# Patient Record
Sex: Female | Born: 1945
Health system: Southern US, Community
[De-identification: ages and names within clinical notes are randomized; demographics above are authoritative.]

## PROBLEM LIST (undated history)

## (undated) DIAGNOSIS — K219 Gastro-esophageal reflux disease without esophagitis: Secondary | ICD-10-CM

## (undated) DIAGNOSIS — M1990 Unspecified osteoarthritis, unspecified site: Secondary | ICD-10-CM

## (undated) DIAGNOSIS — T7840XA Allergy, unspecified, initial encounter: Secondary | ICD-10-CM

## (undated) DIAGNOSIS — J302 Other seasonal allergic rhinitis: Secondary | ICD-10-CM

## (undated) HISTORY — PX: BACK SURGERY: SHX140

## (undated) HISTORY — PX: COLONOSCOPY: SHX174

## (undated) HISTORY — DX: Gastro-esophageal reflux disease without esophagitis: K21.9

## (undated) HISTORY — DX: Other seasonal allergic rhinitis: J30.2

## (undated) HISTORY — DX: Unspecified osteoarthritis, unspecified site: M19.90

## (undated) HISTORY — DX: Allergy, unspecified, initial encounter: T78.40XA

## (undated) HISTORY — PX: CERVICAL DISC SURGERY: SHX588

---

## 1998-06-17 ENCOUNTER — Encounter: Payer: Self-pay | Admitting: Neurological Surgery

## 1998-06-17 ENCOUNTER — Ambulatory Visit (HOSPITAL_COMMUNITY): Admission: RE | Admit: 1998-06-17 | Discharge: 1998-06-17 | Payer: Self-pay | Admitting: Neurological Surgery

## 1999-04-22 ENCOUNTER — Other Ambulatory Visit: Admission: RE | Admit: 1999-04-22 | Discharge: 1999-04-22 | Payer: Self-pay | Admitting: Gynecology

## 1999-05-17 ENCOUNTER — Encounter: Payer: Self-pay | Admitting: Gynecology

## 1999-05-17 ENCOUNTER — Encounter: Admission: RE | Admit: 1999-05-17 | Discharge: 1999-05-17 | Payer: Self-pay | Admitting: Gynecology

## 2000-04-24 ENCOUNTER — Other Ambulatory Visit: Admission: RE | Admit: 2000-04-24 | Discharge: 2000-04-24 | Payer: Self-pay | Admitting: Gynecology

## 2001-05-09 ENCOUNTER — Other Ambulatory Visit: Admission: RE | Admit: 2001-05-09 | Discharge: 2001-05-09 | Payer: Self-pay | Admitting: Obstetrics and Gynecology

## 2001-12-06 ENCOUNTER — Encounter: Payer: Self-pay | Admitting: Family Medicine

## 2001-12-06 ENCOUNTER — Encounter: Admission: RE | Admit: 2001-12-06 | Discharge: 2001-12-06 | Payer: Self-pay | Admitting: Family Medicine

## 2002-02-12 ENCOUNTER — Ambulatory Visit (HOSPITAL_BASED_OUTPATIENT_CLINIC_OR_DEPARTMENT_OTHER): Admission: RE | Admit: 2002-02-12 | Discharge: 2002-02-12 | Payer: Self-pay | Admitting: Orthopaedic Surgery

## 2002-03-22 ENCOUNTER — Encounter: Payer: Self-pay | Admitting: Orthopaedic Surgery

## 2002-03-22 ENCOUNTER — Encounter: Admission: RE | Admit: 2002-03-22 | Discharge: 2002-03-22 | Payer: Self-pay | Admitting: Orthopaedic Surgery

## 2002-05-12 ENCOUNTER — Observation Stay (HOSPITAL_COMMUNITY): Admission: EM | Admit: 2002-05-12 | Discharge: 2002-05-13 | Payer: Self-pay | Admitting: *Deleted

## 2002-05-12 ENCOUNTER — Encounter: Payer: Self-pay | Admitting: *Deleted

## 2002-05-14 ENCOUNTER — Other Ambulatory Visit: Admission: RE | Admit: 2002-05-14 | Discharge: 2002-05-14 | Payer: Self-pay | Admitting: Obstetrics and Gynecology

## 2003-05-20 ENCOUNTER — Other Ambulatory Visit: Admission: RE | Admit: 2003-05-20 | Discharge: 2003-05-20 | Payer: Self-pay | Admitting: Obstetrics and Gynecology

## 2004-06-03 ENCOUNTER — Other Ambulatory Visit: Admission: RE | Admit: 2004-06-03 | Discharge: 2004-06-03 | Payer: Self-pay | Admitting: Obstetrics and Gynecology

## 2005-06-09 ENCOUNTER — Encounter (INDEPENDENT_AMBULATORY_CARE_PROVIDER_SITE_OTHER): Payer: Self-pay | Admitting: Specialist

## 2005-06-09 ENCOUNTER — Ambulatory Visit (HOSPITAL_BASED_OUTPATIENT_CLINIC_OR_DEPARTMENT_OTHER): Admission: RE | Admit: 2005-06-09 | Discharge: 2005-06-09 | Payer: Self-pay | Admitting: Orthopedic Surgery

## 2005-06-30 ENCOUNTER — Other Ambulatory Visit: Admission: RE | Admit: 2005-06-30 | Discharge: 2005-06-30 | Payer: Self-pay | Admitting: Obstetrics and Gynecology

## 2006-03-21 LAB — HM COLONOSCOPY

## 2007-07-17 ENCOUNTER — Encounter: Admission: RE | Admit: 2007-07-17 | Discharge: 2007-07-17 | Payer: Self-pay | Admitting: Orthopaedic Surgery

## 2007-08-02 ENCOUNTER — Encounter: Admission: RE | Admit: 2007-08-02 | Discharge: 2007-08-02 | Payer: Self-pay | Admitting: Family Medicine

## 2007-08-31 ENCOUNTER — Encounter: Admission: RE | Admit: 2007-08-31 | Discharge: 2007-08-31 | Payer: Self-pay | Admitting: Obstetrics and Gynecology

## 2008-08-12 ENCOUNTER — Encounter: Admission: RE | Admit: 2008-08-12 | Discharge: 2008-08-12 | Payer: Self-pay | Admitting: Orthopaedic Surgery

## 2009-02-26 ENCOUNTER — Encounter: Admission: RE | Admit: 2009-02-26 | Discharge: 2009-02-26 | Payer: Self-pay | Admitting: Orthopaedic Surgery

## 2009-05-19 ENCOUNTER — Encounter: Payer: Self-pay | Admitting: Cardiovascular Disease

## 2009-08-24 ENCOUNTER — Encounter: Payer: Self-pay | Admitting: Cardiovascular Disease

## 2009-08-28 DIAGNOSIS — R0789 Other chest pain: Secondary | ICD-10-CM | POA: Insufficient documentation

## 2009-08-31 ENCOUNTER — Ambulatory Visit: Payer: Self-pay | Admitting: Cardiovascular Disease

## 2009-08-31 DIAGNOSIS — R072 Precordial pain: Secondary | ICD-10-CM

## 2009-08-31 HISTORY — DX: Precordial pain: R07.2

## 2009-09-23 ENCOUNTER — Ambulatory Visit: Payer: Self-pay

## 2009-09-23 ENCOUNTER — Encounter: Payer: Self-pay | Admitting: Cardiovascular Disease

## 2009-09-23 ENCOUNTER — Ambulatory Visit (HOSPITAL_COMMUNITY): Admission: RE | Admit: 2009-09-23 | Discharge: 2009-09-23 | Payer: Self-pay | Admitting: Cardiovascular Disease

## 2009-09-23 ENCOUNTER — Ambulatory Visit: Payer: Self-pay | Admitting: Cardiology

## 2009-09-29 ENCOUNTER — Ambulatory Visit: Payer: Self-pay | Admitting: Cardiovascular Disease

## 2010-04-22 NOTE — Assessment & Plan Note (Signed)
Summary: np6/chest pain/jml   Visit Type:  new pt visit Primary Provider:  Gilmore Laroche  CC:  chest pain..sob at times w/left arm pain and jaw pain....no other fcomplaints today.  History of Present Illness: 65 yo WF with history of arthritis but no other significant medical history who is referred today for evaluation of chest pressure. The pain starts in the center of her chest and radiates into her neck and left arm. There is associated SOB. There is no associated diaphoresis or nausea. This lasts for one minute. No  prior cardiac history. This has been occurring once every three months for the past two years. No prior treatment for GERD.   Current Medications (verified): 1)  Diclofenac Sodium 75 Mg Tbec (Diclofenac Sodium) .... 2 Tab Once Daily 2)  Ferrousul 325 (65 Fe) Mg Tabs (Ferrous Sulfate) .Marland Kitchen.. 1 Tab Once Daily 3)  Dhea 10 Mg Caps (Prasterone (Dhea)) .... 1/2 Cap Once Daily 4)  Fish Oil  Oil (Fish Oil) .... 2 Cap Once Daily 5)  No Flush Niacin 400-100 Mg Caps (Niacin-Inositol) .Marland Kitchen.. 1 Tab Once Daily 6)  Vitamin D3 5000 Unit Caps (Cholecalciferol) .Marland Kitchen.. 1 Cap Once Daily 7)  Glucosamine-Chondroitin 1500-1200 Mg/59ml Liqd (Glucosamine-Chondroitin) .... 2 Tab Once Daily 8)  Multivitamins   Tabs (Multiple Vitamin) .Marland Kitchen.. 1 Tab Once Daily 9)  Calcium/vitamin D/minerals 600-200 Mg-Unit Tabs (Calcium Carbonate-Vit D-Min) .... 2 Tabs Once Daily 10)  Vitamin C 500 Mg Tabs (Ascorbic Acid) .Marland Kitchen.. 1 Tab Once Daily 11)  Prolent .... 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  The patient denies fatigue, malaise, fever, weight gain/loss, vision loss, decreased hearing, hoarseness, palpitations, shortness of breath, prolonged cough, wheezing, sleep apnea, coughing up blood, abdominal pain, blood in stool, nausea, vomiting, diarrhea, heartburn, incontinence, blood in urine, muscle weakness, joint pain, leg swelling, rash, skin lesions, headache, fainting, dizziness, depression, anxiety, enlarged lymph nodes, easy bruising or bleeding, and environmental allergies.    Vital Signs:  Patient profile:   65 year old female Height:      63 inches Weight:      127 pounds BMI:     22.58 Pulse rate:   83 / minute Pulse rhythm:   regular BP sitting:   120 / 60  (left arm) Cuff size:   large  Vitals Entered By: Danielle Rankin, CMA (August 31, 2009 11:47 AM)  Physical Exam  General:  General: Well developed, well nourished, NAD HEENT: OP clear, mucus membranes moist SKIN: warm, dry Neuro: No focal deficits Musculoskeletal: Muscle strength 5/5 all ext Psychiatric: Mood and affect normal Neck: No JVD, no carotid bruits, no thyromegaly, no lymphadenopathy. Lungs:Clear bilaterally, no wheezes, rhonci, crackles CV: RRR no murmurs, gallops rubs Abdomen: soft, NT, ND, BS present Extremities: No edema, pulses 2+.    EKG  Procedure date:  08/31/2009  Findings:      NSR, rate 83 bpm.   Impression & Recommendations:  Problem # 1:  CHEST PAIN-PRECORDIAL (ICD-786.51) She has no risk factors for CAD. Her chest pain  is aytpical. Given her age, she is concerned about the possibility of obstructive CAD. Will arrange stress echo to risk stratify.   Other Orders: EKG w/ Interpretation (93000) Stress Echo (Stress Echo)  Patient Instructions: 1)  Your physician recommends that you schedule a follow-up appointment in: 3-4 weeks 2)  Your physician has requested that  you have a stress echocardiogram. For further information please visit [email or web address]  Please follow instruction sheet as given.

## 2010-04-22 NOTE — Assessment & Plan Note (Signed)
Summary: 1 month rov./sl   Visit Type:  Follow-up Primary Provider:  Gilmore Laroche  CC:  no complaints.  History of Present Illness: 65 yo WF with history of arthritis but no other significant medical history who was referred several weeks ago for evaluation of chest pressure. The pain starts in the center of her chest and radiates into her neck and left arm. There is associated SOB. There is no associated diaphoresis or nausea. This lasts for one minute. No  prior cardiac history. This has been occurring once every three months for the past two years. No prior treatment for GERD. I arranged a stress echo. She exercised for over 9 minutes without chest pain or EKG changes. Stress echo images without evidence of ischemia. She had another episode yesterday with pain starting in the upper abdomen and radiating to the left chest wall. Lasted two minutes. No associated symptoms. Not after a meal. No heartburn or reflux symptoms.    Current Medications (verified): 1)  Diclofenac Sodium 75 Mg Tbec (Diclofenac Sodium) .... 2 Tab Once Daily 2)  Ferrousul 325 (65 Fe) Mg Tabs (Ferrous Sulfate) .Marland Kitchen.. 1 Tab Once Daily 3)  Dhea 10 Mg Caps (Prasterone (Dhea)) .... 1/2 Cap Once Daily 4)  Fish Oil  Oil (Fish Oil) .... 2 Cap Once Daily 5)  No Flush Niacin 400-100 Mg Caps (Niacin-Inositol) .Marland Kitchen.. 1 Tab Once Daily 6)  Vitamin D3 5000 Unit Caps (Cholecalciferol) .Marland Kitchen.. 1 Cap Once Daily 7)  Glucosamine-Chondroitin 1500-1200 Mg/71ml Liqd (Glucosamine-Chondroitin) .... 2 Tab Once Daily 8)  Multivitamins   Tabs (Multiple Vitamin) .Marland Kitchen.. 1 Tab Once Daily 9)  Calcium/vitamin D/minerals 600-200 Mg-Unit Tabs (Calcium Carbonate-Vit D-Min) .... 2 Tabs Once Daily 10)  Vitamin C 500 Mg Tabs (Ascorbic Acid) .Marland Kitchen.. 1 Tab Once Daily 11)  Prolent .... 2 Tab Once Daily  Allergies (verified): No Known Drug Allergies  Past History:  Past Medical History: Reviewed history from 08/31/2009 and no changes required. Arthritis Seasonal  allergies  Social History: Reviewed history from 08/31/2009 and no changes required. 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  The patient denies fatigue, malaise, fever, weight gain/loss, vision loss, decreased hearing, hoarseness, palpitations, shortness of breath, prolonged cough, wheezing, sleep apnea, coughing up blood, abdominal pain, blood in stool, nausea, vomiting, diarrhea, heartburn, incontinence, blood in urine, muscle weakness, joint pain, leg swelling, rash, skin lesions, headache, fainting, dizziness, depression, anxiety, enlarged lymph nodes, easy bruising or bleeding, and environmental allergies.    Vital Signs:  Patient profile:   65 year old female Height:      63 inches Weight:      125 pounds BMI:     22.22 Pulse rate:   70 / minute BP sitting:   102 / 60  (left arm) Cuff size:   regular  Vitals Entered By: Hardin Negus, RMA (September 29, 2009 11:28 AM)  Physical Exam  General:  General: Well developed, well nourished, NAD Psychiatric: Mood and affect normal Neck: No JVD, no carotid bruits Lungs:Clear bilaterally, no wheezes, rhonci, crackles CV: RRR no murmurs, gallops rubs Abdomen: soft, NT, ND, BS present Extremities: No edema, pulses 2+.    Stress Echocardiogram  Procedure date:  09/23/2009  Findings:      Stress results: Maximal heart rate during stress was 169bpm (107% of maximal predicted heart rate). The maximal predicted heart  rate was 157bpm. The target heart rate was achieved. The heart rate response to stress was normal. There was a normal resting blood pressure with an appropriate response to stress. The rate-pressure product for the peak heart rate and blood pressure was Hg/min. The patient experienced no chest pain during  stress.  -------------------------------------------------------------------- Stress ECG: The stress ECG was normal.  -------------------------------------------------------------------- Baseline:  - LV size was normal. - LV global systolic function was normal. - Normal wall motion; no LV regional wall motion abnormalities. Peak stress:  - LV size was reduced appropriately and appropriately decreased from   baseline. - LV global systolic function was vigorous and appropriately   augmented from baseline. - Normal wall motion; no LV regional wall motion abnormalities.  -------------------------------------------------------------------- Stress echo results: Left ventricular ejection fraction was normal at rest and with stress. Normal echo stress  Impression & Recommendations:  Problem # 1:  CHEST PAIN, ATYPICAL (ICD-786.59) No evidence of ischemia on stress echo. She has no cardiac risk factors. Her pain is most likely GI related. I have encouraged her to seek medical attention if the symptoms worsen or increase in frequency. She does not wish to start a PPI at this time.   Patient Instructions: 1)  Your physician recommends that you schedule a follow-up appointment as needed.:

## 2010-04-22 NOTE — Progress Notes (Signed)
Summary: [facility] Visit Note   [facility] Visit Note   Imported By: Roderic Ovens 09/17/2009 10:16:25  _____________________________________________________________________  External Attachment:    Type:   Image     Comment:   External Document

## 2010-08-06 NOTE — Op Note (Signed)
Taylor Taylor, Taylor Taylor                ACCOUNT NO.:  [id number]   MEDICAL RECORD NO.:  [id number]          PATIENT TYPE:  AMB   LOCATION:  DSC                          FACILITY:  [facility]   PHYSICIAN:  Cindee Salt, M.D.       DATE OF BIRTH:  04-15-1945   DATE OF PROCEDURE:  06/09/2005  DATE OF DISCHARGE:                                 OPERATIVE REPORT   PREOPERATIVE DIAGNOSES:  1.  Stenosing tenosynovitis, left thumb.  2.  Volar wrist ganglion, left wrist.   POSTOPERATIVE DIAGNOSES:  1.  Stenosing tenosynovitis, left thumb.  2.  Volar wrist ganglion, left wrist.   OPERATION:  1.  Release of A1 pulley, left thumb.  2.  Excision volar wrist, ganglion left wrist.   SURGEON:  Cindee Salt, M.D.   ASSISTANT:  Carolyne Fiscal R.N.   ANESTHESIA:  General.   HISTORY:  The patient is a 65 year old female with a history of triggering  of her right thumb.  This is locked, and she also has a volar wrist  ganglion.   PROCEDURE:  The patient was brought to the operating room, where a general  anesthetic was carried out without difficulty.  This was performed after  questions were answered in the preoperative area and the areas of incision  marked by both the patient and surgeon.  She was prepped and draped using  DuraPrep, supine position, left arm free.  The limb was exsanguinated with  an Esmarch bandage, tourniquet placed high on the arm was inflated to 250  mmHg.  A transverse incision was made over the A1 pulley of the left thumb,  and radial and ulnar neurovascular bundles were identified and protected.  Retractor was placed.  Significant thickening of the A1 pulley was noted.  A  large node was present in the flexor tendon.  No cyst was palpable.  The  entire mass on the volar aspect was of flexor tendon primarily with a lesser  extent the enlarged A1 pulley.  The A1 pulley was released on its radial  aspect.  Fraying of the flexor tendon was noted.  The frayed area was  debrided.  The wound was  irrigated.  The thumb was able to be passed through  a full range of motion, no further triggering was noted.  The Wound was  closed with interrupted 5-0 nylon sutures.  A separate incision was then  made on the volar radial wrist, carried down through subcutaneous tissue,  bleeders again electrocauterized, dissection carried down identifying the  radial artery.  A large multilobulated cyst was immediately encountered,  with blunt and sharp dissection this was dissected free.  A small portion of  the wall of the cyst was left attached to the radial artery.  The dissection  was carried down to the radiocarpal joint.  This area was opened, the stalk  excised, the area debrided with a rongeur.  The wound was irrigated.  The  capsule was then closed with figure-of-eight 4-0 Vicryl sutures, the  subcutaneous tissue with 4-0  Vicryl and skin with interrupted 5-0 nylon sutures.  A sterile  compressive  dressing and splint to the wrist applied.  The patient tolerated the  procedure well, was taken to the recovery room for observation in  satisfactory condition.  She is discharged home to return to the [facility] in one week on Vicodin.           ______________________________  Cindee Salt, M.D.     GK/MEDQ  D:  06/09/2005  T:  06/10/2005  Job:  [id number]

## 2010-08-06 NOTE — Op Note (Signed)
   [name], Taylor Taylor                            ACCOUNT NO.:  [id number]   MEDICAL RECORD NO.:  [id number]                   PATIENT TYPE:  AMB   LOCATION:  DSC                                  FACILITY:  [facility]   PHYSICIAN:  Claude Manges. Cleophas Dunker, M.D.            DATE OF BIRTH:  Jul 26, 1945   DATE OF PROCEDURE:  02/12/2002  DATE OF DISCHARGE:                                 OPERATIVE REPORT   PREOPERATIVE DIAGNOSIS:  Adhesive capsulitis, right shoulder.   POSTOPERATIVE DIAGNOSIS:  Adhesive capsulitis, right shoulder.   OPERATION PERFORMED:  Manipulation and injection of 40 mg of Depo-Medrol,  right shoulder.   SURGEON:  Claude Manges. Cleophas Dunker, M.D.   ANESTHESIA:  General mask.   COMPLICATIONS:  None.   INDICATIONS FOR PROCEDURE:  The patient is a 65 year old female  approximately a month status post arthroscopic subacromial decompression of  the right shoulder.  He has developed an adhesive capsulitis despite course  of physical therapy.  She is now to have manipulation lacking approximately  40 degrees of overhead flexion and about 30 to 40 degrees of full external  rotation.   DESCRIPTION OF PROCEDURE:  With the patient comfortable on the operating  room stretcher and under general mask anesthetic, general manipulation of  the left shoulder was performed.  There was obvious lysis of adhesions such  that I could fully flex the arm overhead and I also manipulated the shoulder  in external rotation.  The shoulder was then prepped with alcohol and  Betadine.  Marcaine, Xylocaine and 40 mg of Depo-Medrol was injected intra-  articularly.  A band-aid was applied and the patient was then awakened and  returned to the post anesthesia recovery room in satisfactory condition.  She will have planned follow-up physical therapy tomorrow or Friday  depending upon her pain.  She has Percocet at home for discomfort.  I will  check her in the office in 10 to 14 days.                           Claude Manges. Cleophas Dunker, M.D.    PWW/MEDQ  D:  02/12/2002  T:  02/12/2002  Job:  [id number]

## 2010-08-06 NOTE — H&P (Signed)
NAMECALLYN, SEVERTSON NO.:  [id number]   MEDICAL RECORD NO.:  [id number]                   PATIENT TYPE:  INP   LOCATION:  2023                                 FACILITY:  [facility]   PHYSICIAN:  Arvilla Meres, M.D. [facility]          DATE OF BIRTH:  1945/07/23   DATE OF ADMISSION:  05/12/2002  DATE OF DISCHARGE:                                HISTORY & PHYSICAL   PRIMARY CARE PHYSICIAN:  Miguel Aschoff, M.D. at [facility].   CARDIOLOGIST:  She is new to [facility].   HISTORY OF PRESENT ILLNESS:  The patient is a delightful and very active 65-  year-old white female without a history of known coronary artery disease or  significant cardiac risk factors, who is admitted through the emergency room  for two episodes of chest pressure radiating to her neck over the past six  weeks.   Today, she reports she had the sudden onset of midsternal chest pressure at  about 11:45 a.m. while standing in church.  This was associated with mild  nausea and pain radiating to her neck and her jaw.  There was no associated  vomiting, shortness of breath, diaphoresis, or palpitations.  She said the  episode lasted about 10 minutes, and resolved spontaneously.  She went to a  fire station and had an EKG checked which was okay, but she was advised that  she should go to the hospital for further evaluation.  In the ER, her EKG  showed sinus bradycardia without any ischemic changes, and her first set of  enzymes were negative.   She reports a similar episode approximately six weeks ago also at rest which  resolved spontaneously.   She does say that she walks on the treadmill fairly vigorously two miles a  day without any exertional symptoms.  She denies any recent long plane or  car trips, or any swelling in her legs.   REVIEW OF SYMPTOMS:  She denies fevers, chills, abdominal pain, symptoms of  reflux disease, dysuria, bright red blood per rectum,  melena, claudication,  or congestive heart failure symptoms.   PAST MEDICAL HISTORY:  1. She is post-menopausal x13 years, on hormone replacement therapy.  2. She has lumbar spine arthritis.   MEDICATIONS:  1. Hormone replacement therapy which she tried to discontinue 1-1/2 years     ago, but was unable to due to hot flashes.  2. Voltaren 75 mg daily.   ALLERGIES:  No known drug allergies.   SOCIAL HISTORY:  She lives in [place] with her husband.  She is a  housewife.  She denies any alcohol or drug use.   FAMILY HISTORY:  She has three siblings, none of whom have coronary disease.  Her mother is 47 and has Parkinson's.  Her father died at age 52 from lung  cancer.  There is no family history of coronary disease.  PHYSICAL EXAMINATION:  GENERAL:  She is very vigorous appearing.  VITAL SIGNS:  Blood pressure is 111/80, heart rate is 60 and regular,  temperature is 97.9, she is saturating 100% on room air.  HEENT:  Sclerae are anicteric.  NECK:  Supple, her carotids are 2+ bilaterally without bruits.  Her JVP is  approximately 7 cm of water with prominent CV waves.  There is also evidence  of hepatojugular reflux.  CHEST:  Nontender to palpation.  Her PMI is nondisplaced.  There is a  regular rate and rhythm with a normal S1 and S2.  She has a 2/6 systolic  ejection murmur at the left sternal border, as well as a faint diastolic  murmur.  LUNGS:  Clear to auscultation bilaterally.  ABDOMEN:  Soft, nontender, nondistended, there are no masses or bruits.  There is no hepatomegaly.  EXTREMITIES:  Femoral pulses are 2+ bilaterally without any bruits.  There  is no cyanosis, clubbing, or edema.  There are no cords.  Her dorsalis pedis  pulses are 2+ bilaterally.   LABORATORY DATA:  Her chest x-ray was negative per report.  EKG shows sinus  bradycardia at a rate of 58.  There is no evidence of ischemia.  White blood  cell count of 11, hemoglobin of 12.4, platelets of 293.  Sodium  of 139,  potassium 3.9, BUN 18, creatinine 0.8, glucose 91.  Lipid panel is normal.  CK is 71, MB 1, troponin-I is 0.01.   ASSESSMENT AND PLAN:  The patient is a very delightful 65 year old with no  history of known coronary disease, and no significant cardiac risk factors,  who is admitted for two episodes of rest chest pain.  Given her history and  the fact that there is no evidence of objective ischemia, I doubt very much  that this represents cardiac chest pain, however, the description of her  symptoms is somewhat concerning.  Therefore, I think it would be prudent to  bring her in for a 23 hour observation and rule out for myocardial  infarction.  If her enzymes remain negative, she will undergo a treadmill  Cardiolite in the morning for further risk stratification.  I have held her  beta blocker in anticipation for her stress test.  We will also check a  fasting lipid profile as well as a D-dimer.  By examination, she likely has  some degree of tricuspid regurgitation, but we will get an echocardiogram to  further delineate.  Should her cardiac markers turn positive, she will  obviously need cardiac catheterization.                                               Arvilla Meres, M.D. [facility]    DB/MEDQ  D:  05/12/2002  T:  05/12/2002  Job:  [id number]

## 2010-08-06 NOTE — Discharge Summary (Signed)
   NAMERAMANDA, PAULES                            ACCOUNT NO.:  [id number]   MEDICAL RECORD NO.:  [id number]                   PATIENT TYPE:  INP   LOCATION:  2023                                 FACILITY:  [facility]   PHYSICIAN:  Superior Bing, M.D. [facility]           DATE OF BIRTH:  [birth year]   DATE OF ADMISSION:  05/12/2002  DATE OF DISCHARGE:  05/13/2002                           DISCHARGE SUMMARY - REFERRING   PROCEDURE:  None.   REASON FOR ADMISSION:  Please refer to dictated admission note.   LABORATORY DATA:  Complete metabolic profile, CBC, serial cardiac markers,  TSH, D-dimer, and C reactive protein within normal limits.   Chest x-ray showed no active disease.   HOSPITAL COURSE:  The patient was admitted for further evaluation of new  onset chest pain.  Serial cardiac markers, as well as a D-dimer and C  reactive protein were within normal limits.  Serial EKGs showed no ischemic  changes.   The patient reported no recurrent chest pain following initial presentation  to the emergency room.  She was cleared for discharge by Dr. Eden Emms with  recommendation to proceed with early outpatient exercise stress Cardiolite  testing.  A 2-D echocardiogram  will also be arranged.  Plan is to have the  patient then return to the office for subsequent follow-up with Dr. Charlton Haws.   MEDICATION ADJUSTMENT:  Addition of low dose aspirin.   DISCHARGE MEDICATIONS:  1. Aspirin 81 mg daily.  2. Voltaren 75 mg daily p.r.n.  3. Estrogen replacement medication as directed.   DISCHARGE INSTRUCTIONS:  The patient is scheduled to proceed with an  exercise stress Cardiolite at the [facility] office on Wednesday,  May 15, 2002, at 1 o'clock.  She is also scheduled for a 2-D  echocardiogram  at 1:30.   The patient is scheduled to follow up with Dr. Charlton Haws on July 09, 2002, at 11 a.m.   DISCHARGE DIAGNOSES:  1. New onset chest pain.     a. Normal serial cardiac  markers.     b. Schedule for outpatient exercise Cardiolite.  2. Sinus bradycardia.    Gene Serpe, P.A. [facility]                      [place] Bing, M.D. [facility]   GS/MEDQ  D:  05/13/2002  T:  05/13/2002  Job:  [id number]

## 2010-12-08 ENCOUNTER — Other Ambulatory Visit: Payer: Self-pay | Admitting: Orthopaedic Surgery

## 2010-12-08 DIAGNOSIS — M25561 Pain in right knee: Secondary | ICD-10-CM

## 2010-12-15 ENCOUNTER — Ambulatory Visit
Admission: RE | Admit: 2010-12-15 | Discharge: 2010-12-15 | Disposition: A | Discharge status: Home / Self Care | Payer: BC Managed Care – PPO | Source: Ambulatory Visit | Attending: Orthopaedic Surgery | Admitting: Orthopaedic Surgery

## 2010-12-15 ENCOUNTER — Other Ambulatory Visit: Payer: Self-pay

## 2010-12-15 DIAGNOSIS — M25561 Pain in right knee: Secondary | ICD-10-CM

## 2010-12-21 ENCOUNTER — Telehealth: Payer: Self-pay | Admitting: Cardiovascular Disease

## 2010-12-21 NOTE — Telephone Encounter (Signed)
12 Lead faxed to [facility] @ [phone]   12/21/10/km

## 2011-05-20 LAB — HM DEXA SCAN

## 2012-07-24 ENCOUNTER — Encounter: Payer: Self-pay | Admitting: Family Medicine

## 2012-07-24 ENCOUNTER — Ambulatory Visit (INDEPENDENT_AMBULATORY_CARE_PROVIDER_SITE_OTHER): Payer: Medicare Other | Admitting: Family Medicine

## 2012-07-24 VITALS — BP 110/64 | HR 76 | Temp 98.2°F | Resp 16 | Wt 126.0 lb

## 2012-07-24 DIAGNOSIS — R197 Diarrhea, unspecified: Secondary | ICD-10-CM

## 2012-07-24 DIAGNOSIS — R0989 Other specified symptoms and signs involving the circulatory and respiratory systems: Secondary | ICD-10-CM

## 2012-07-24 DIAGNOSIS — R0609 Other forms of dyspnea: Secondary | ICD-10-CM

## 2012-07-24 DIAGNOSIS — R0600 Dyspnea, unspecified: Secondary | ICD-10-CM

## 2012-07-24 LAB — CBC WITH DIFFERENTIAL/PLATELET
Basophils Absolute: 0 10*3/uL (ref 0.0–0.1)
Basophils Relative: 0 % (ref 0–1)
Eosinophils Absolute: 0.1 10*3/uL (ref 0.0–0.7)
Eosinophils Relative: 2 % (ref 0–5)
HCT: 36.3 % (ref 36.0–46.0)
Hemoglobin: 11.9 g/dL — ABNORMAL LOW (ref 12.0–15.0)
Lymphocytes Relative: 34 % (ref 12–46)
Lymphs Abs: 1.8 10*3/uL (ref 0.7–4.0)
MCH: 30.6 pg (ref 26.0–34.0)
MCHC: 32.8 g/dL (ref 30.0–36.0)
MCV: 93.3 fL (ref 78.0–100.0)
Monocytes Absolute: 0.5 10*3/uL (ref 0.1–1.0)
Monocytes Relative: 10 % (ref 3–12)
Neutro Abs: 2.9 10*3/uL (ref 1.7–7.7)
Neutrophils Relative %: 54 % (ref 43–77)
Platelets: 274 10*3/uL (ref 150–400)
RBC: 3.89 MIL/uL (ref 3.87–5.11)
RDW: 13.6 % (ref 11.5–15.5)
WBC: 5.3 10*3/uL (ref 4.0–10.5)

## 2012-07-24 MED ORDER — FLUTICASONE PROPIONATE 50 MCG/ACT NA SUSP: 2.0000 | Freq: Every day | NASAL | Status: DC

## 2012-07-24 NOTE — Progress Notes (Signed)
  Subjective:    Patient ID: Taylor Taylor, female    DOB: 1945/05/26, 67 y.o.   MRN: [id number]  HPI Began Saturday. She says she's becoming winded with minimal exertion. This is new for her. She denies any chest pain. She denies any angina. She denies any radiation. She denies any orthopnea. She denies any paroxysmal nocturnal dyspnea.  She has no peripheral edema. She's not obtained fluid. She denies pleurisy. She has been given with allergies. She's also congested. She denies any wheezing or coughing.  Then beginning Monday she started developing diarrhea. She's now having loose artery stools for 2 days. She denies any melena, hematochezia, fever she is having a little bit of nausea but no vomiting. No past medical history on file. No current outpatient prescriptions on file prior to visit.   No current facility-administered medications on file prior to visit.   No Known Allergies History   Social History  . Marital Status: Married    Spouse Name: N/A    Number of Children: N/A  . Years of Education: N/A   Occupational History  . Not on file.   Social History Main Topics  . Smoking status: Never Smoker   . Smokeless tobacco: Not on file  . Alcohol Use: No  . Drug Use: No  . Sexually Active: Not on file   Other Topics Concern  . Not on file   Social History Narrative  . No narrative on file      Review of Systems  All other systems reviewed and are negative.       Objective:   Physical Exam  Constitutional: She appears well-developed and well-nourished.  HENT:  Right Ear: External ear normal.  Left Ear: External ear normal.  Mouth/Throat: Oropharynx is clear and moist.  Eyes: Conjunctivae are normal. Pupils are equal, round, and reactive to light.  Neck: Normal range of motion. Neck supple. No JVD present. No thyromegaly present.  Cardiovascular: Normal rate, regular rhythm, normal heart sounds and intact distal pulses.  Exam reveals no gallop and no friction  rub.   No murmur heard. Pulmonary/Chest: Effort normal and breath sounds normal. No respiratory distress. She has no wheezes. She has no rales. She exhibits no tenderness.  Abdominal: Soft. Bowel sounds are normal.  Lymphadenopathy:    She has no cervical adenopathy.          Assessment & Plan:  1. Dyspnea EKG shows normal sinus rhythm at 67 beats per minute with no ischemia or infarction.  There is no evidence of cardiac strain.  Check a CBC to rule out anemia. Her exam today is in completely normal. Of his likely part of the overall viral syndrome is more of a fatigue to dyspnea. As the patient to monitor for one week. She should return immediately if symptoms worsen or change. We discussed getting a chest x-ray but the patient elects to monitor for now. - CBC with Differential - EKG 12-Lead  2. Diarrhea Likely viral gastroenteritis.  Recommended Imodium over-the-counter when necessary diarrhea. I also caused him Flonase for her allergies. Recommended tincture of time for total 7 days. Symptoms worsen she is to call me immediately.

## 2012-10-24 ENCOUNTER — Other Ambulatory Visit: Payer: Self-pay

## 2012-10-31 ENCOUNTER — Encounter: Payer: Self-pay | Admitting: Family Medicine

## 2013-01-01 ENCOUNTER — Ambulatory Visit (INDEPENDENT_AMBULATORY_CARE_PROVIDER_SITE_OTHER): Payer: Medicare Other | Admitting: *Deleted

## 2013-01-01 VITALS — Temp 98.2°F

## 2013-01-01 DIAGNOSIS — Z23 Encounter for immunization: Secondary | ICD-10-CM

## 2013-02-21 ENCOUNTER — Other Ambulatory Visit: Payer: Self-pay | Admitting: Family Medicine

## 2013-02-21 MED ORDER — FLUTICASONE PROPIONATE 50 MCG/ACT NA SUSP: 2.0000 | Freq: Every day | NASAL | Status: DC

## 2013-02-21 NOTE — Telephone Encounter (Signed)
Rx Refilled  

## 2013-05-03 ENCOUNTER — Encounter: Payer: Self-pay | Admitting: Family Medicine

## 2013-05-03 ENCOUNTER — Ambulatory Visit (INDEPENDENT_AMBULATORY_CARE_PROVIDER_SITE_OTHER): Payer: Medicare Other | Admitting: Family Medicine

## 2013-05-03 VITALS — BP 122/64 | HR 78 | Temp 98.4°F | Resp 16 | Ht 64.0 in | Wt 128.0 lb

## 2013-05-03 DIAGNOSIS — J019 Acute sinusitis, unspecified: Secondary | ICD-10-CM

## 2013-05-03 MED ORDER — PREDNISONE 20 MG PO TABS: ORAL_TABLET | ORAL | Status: DC

## 2013-05-03 MED ORDER — CEFDINIR 300 MG PO CAPS: 300.0000 mg | ORAL_CAPSULE | Freq: Two times a day (BID) | ORAL | Status: DC

## 2013-05-03 NOTE — Progress Notes (Signed)
   Subjective:    Patient ID: Chiniqua Kilcrease, female    DOB: 10-Jan-1946, 68 y.o.   MRN: [id number]  HPI Patient presents with over 2 weeks of frontal and maxillary sinus pain pressure postnasal drip and copious rhinorrhea. She reports fevers and sinus pain and pressure. She denies any cough. She denies any sore throat. She denies any otalgia. She does state her ears feel stopped up. Past Medical History  Diagnosis Date  . GERD (gastroesophageal reflux disease)   . Allergy     Seasonal  . Arthritis    Current Outpatient Prescriptions on File Prior to Visit  Medication Sig Dispense Refill  . diclofenac (VOLTAREN) 75 MG EC tablet Take 75 mg by mouth 2 (two) times daily.      Marland Kitchen estradiol (ESTRACE) 1 MG tablet Take 1 mg by mouth daily.      . fluticasone (FLONASE) 50 MCG/ACT nasal spray Place 2 sprays into both nostrils daily.  48 g  4  . progesterone (PROMETRIUM) 100 MG capsule Take 100 mg by mouth daily.       No current facility-administered medications on file prior to visit.   No Known Allergies History   Social History  . Marital Status: Married    Spouse Name: N/A    Number of Children: N/A  . Years of Education: N/A   Occupational History  . Not on file.   Social History Main Topics  . Smoking status: Never Smoker   . Smokeless tobacco: Not on file  . Alcohol Use: No  . Drug Use: No  . Sexual Activity: Not on file   Other Topics Concern  . Not on file   Social History Narrative  . No narrative on file      Review of Systems  All other systems reviewed and are negative.       Objective:   Physical Exam  Vitals reviewed. Constitutional: She appears well-developed and well-nourished. No distress.  HENT:  Right Ear: External ear normal.  Left Ear: External ear normal.  Nose: Mucosal edema and rhinorrhea present. Right sinus exhibits maxillary sinus tenderness and frontal sinus tenderness. Left sinus exhibits maxillary sinus tenderness and frontal sinus  tenderness.  Mouth/Throat: No oropharyngeal exudate.  Eyes: Conjunctivae are normal. No scleral icterus.  Neck: Neck supple.  Cardiovascular: Normal rate, regular rhythm and normal heart sounds.  Exam reveals no gallop and no friction rub.   No murmur heard. Pulmonary/Chest: Effort normal and breath sounds normal. No respiratory distress. She has no wheezes. She has no rales.  Lymphadenopathy:    She has no cervical adenopathy.  Skin: She is not diaphoretic.          Assessment & Plan:  1. Acute rhinosinusitis - cefdinir (OMNICEF) 300 MG capsule; Take 1 capsule (300 mg total) by mouth 2 (two) times daily.  Dispense: 20 capsule; Refill: 0 - predniSONE (DELTASONE) 20 MG tablet; 3 tabs poqday 1-2, 2 tabs poqday 3-4, 1 tab poqday 5-6  Dispense: 12 tablet; Refill: 0 Patient should also use nasal saline 4 times a day. Recheck in 1 week if no better or sooner if worse.

## 2013-06-20 LAB — HM MAMMOGRAPHY

## 2013-07-04 ENCOUNTER — Encounter: Payer: Self-pay | Admitting: Family Medicine

## 2013-07-04 ENCOUNTER — Ambulatory Visit (INDEPENDENT_AMBULATORY_CARE_PROVIDER_SITE_OTHER): Payer: Medicare Other | Admitting: Family Medicine

## 2013-07-04 VITALS — BP 108/62 | HR 60 | Temp 98.1°F | Resp 14 | Ht 64.0 in | Wt 130.0 lb

## 2013-07-04 DIAGNOSIS — E039 Hypothyroidism, unspecified: Secondary | ICD-10-CM

## 2013-07-04 DIAGNOSIS — R635 Abnormal weight gain: Secondary | ICD-10-CM

## 2013-07-04 NOTE — Progress Notes (Signed)
   Subjective:    Patient ID: Taylor Taylor, female    DOB: August 31, 1945, 68 y.o.   MRN: [id number]  HPI Patient is here today requesting thyroid check. She is tired and she is unable to his weight. Her BMI today is 23 which is normal. She would like to weigh approximately 120 pounds because this is her weight when she was younger.  This would give her a BMI of 21.  She is exercising everyday running on the treadmill. She is eating a healthy diet she has not been able to lower her weight any further. Past Medical History  Diagnosis Date  . GERD (gastroesophageal reflux disease)   . Allergy     Seasonal  . Arthritis    Current Outpatient Prescriptions on File Prior to Visit  Medication Sig Dispense Refill  . diclofenac (VOLTAREN) 75 MG EC tablet Take 75 mg by mouth 2 (two) times daily.      Marland Kitchen estradiol (ESTRACE) 1 MG tablet Take 1 mg by mouth daily.      . fluticasone (FLONASE) 50 MCG/ACT nasal spray Place 2 sprays into both nostrils daily.  48 g  4  . progesterone (PROMETRIUM) 100 MG capsule Take 100 mg by mouth daily.       No current facility-administered medications on file prior to visit.   No Known Allergies History   Social History  . Marital Status: Married    Spouse Name: N/A    Number of Children: N/A  . Years of Education: N/A   Occupational History  . Not on file.   Social History Main Topics  . Smoking status: Never Smoker   . Smokeless tobacco: Not on file  . Alcohol Use: No  . Drug Use: No  . Sexual Activity: Not on file   Other Topics Concern  . Not on file   Social History Narrative  . No narrative on file      Review of Systems  All other systems reviewed and are negative.      Objective:   Physical Exam  Vitals reviewed. Constitutional: She appears well-developed and well-nourished.  Neck: Neck supple. No JVD present. No tracheal deviation present. No thyromegaly present.  Cardiovascular: Normal rate, regular rhythm and normal heart sounds.   Exam reveals no gallop and no friction rub.   No murmur heard. Pulmonary/Chest: Effort normal and breath sounds normal. No respiratory distress. She has no wheezes. She has no rales.  Abdominal: Soft. Bowel sounds are normal. She exhibits no distension. There is no tenderness. There is no rebound and no guarding.          Assessment & Plan:  1. Weight gain I explained to the patient that her weight is healthy. I do not feel that she has a thyroid issue. I will be glad to check a TSH which she requests. I recommended that she not focus on her weight but rather on parts of her body that she was to improve. For instance she is unsatisfied with excess fat around her midsection she could target the area with crunches and sit-ups rather than just while 30 minutes. Therefore I recommended a more targeted approach to exercise. - TSH

## 2013-07-05 LAB — TSH: TSH: 2.121 u[IU]/mL (ref 0.350–4.500)

## 2013-07-11 ENCOUNTER — Other Ambulatory Visit: Payer: Self-pay | Admitting: Family Medicine

## 2013-07-11 ENCOUNTER — Encounter: Payer: Self-pay | Admitting: Family Medicine

## 2013-07-11 DIAGNOSIS — Z1231 Encounter for screening mammogram for malignant neoplasm of breast: Secondary | ICD-10-CM

## 2013-07-18 ENCOUNTER — Encounter: Payer: Self-pay | Admitting: Family Medicine

## 2013-07-24 ENCOUNTER — Ambulatory Visit: Payer: BC Managed Care – PPO

## 2013-08-14 ENCOUNTER — Other Ambulatory Visit: Payer: Self-pay | Admitting: Obstetrics and Gynecology

## 2013-09-03 ENCOUNTER — Ambulatory Visit (INDEPENDENT_AMBULATORY_CARE_PROVIDER_SITE_OTHER): Payer: Medicare Other | Admitting: Family Medicine

## 2013-09-03 ENCOUNTER — Encounter: Payer: Self-pay | Admitting: Family Medicine

## 2013-09-03 VITALS — BP 102/68 | HR 78 | Temp 98.3°F | Resp 16 | Ht 61.0 in | Wt 126.0 lb

## 2013-09-03 DIAGNOSIS — E781 Pure hyperglyceridemia: Secondary | ICD-10-CM

## 2013-09-03 HISTORY — DX: Pure hyperglyceridemia: E78.1

## 2013-09-03 NOTE — Assessment & Plan Note (Signed)
Her triglycerides are minimally elevated and honestly I think this is because she was not fasting. She is very concerned about the change. We did discuss dietary changes as well as return for a fasting lab in about 12 weeks to see the difference. She can change to coconut oil and olive oil, she is already on Omega3 1200 mg once a day she is exercising on daily basis she's also due eating a good amount of fiber. She has no history of coronary artery disease hypertension diabetes mellitus there is no hypertriglyceridemia or hyperlipidemia and her family. I tried to give her more reassurance today but we will go ahead and recheck this in 12 weeks

## 2013-09-03 NOTE — Progress Notes (Signed)
Patient ID: Taylor Taylor, female   DOB: [birth year], 68 y.o.   MRN: [id number]   Subjective:    Patient ID: Taylor Taylor, female    DOB: 06/07/1945, 68 y.o.   MRN: [id number]  Patient presents for Review triglycerides  patient here to review lab work. She had labs done by her GYN her labs were nonfasting her triglycerides are slightly elevated at 154 her total cholesterol was 183 HDL 76 LDL 76 she's not had a lipid panel in about 3 years she did show me the other was 2 compare it to she is very concerned as her triglycerides it went from the 50s up to 154. She eats they're healthy she has a very strict exercise routine. She asked about some ways that she could decrease her triglycerides with her food.she has no specific concerns today.    Review Of Systems:  GEN- denies fatigue, fever, weight loss,weakness, recent illness HEENT- denies eye drainage, change in vision, nasal discharge, CVS- denies chest pain, palpitations RESP- denies SOB, cough, wheeze ABD- denies N/V, change in stools, abd pain GU- denies dysuria, hematuria, dribbling, incontinence MSK- denies joint pain, muscle aches, injury Neuro- denies headache, dizziness, syncope, seizure activity       Objective:    BP 102/68  Pulse 78  Temp(Src) 98.3 F (36.8 C) (Oral)  Resp 16  Ht 5\' 1"  (1.549 m)  Wt 126 lb (57.153 kg)  BMI 23.82 kg/m2 GEN- NAD, alert and oriented x3         Assessment & Plan:      Problem List Items Addressed This Visit   None      Note: This dictation was prepared with Dragon dictation along with smaller phrase technology. Any transcriptional errors that result from this process are unintentional.

## 2013-09-03 NOTE — Patient Instructions (Signed)
Return 1st week of Sept for cholesterol check F/U as needed

## 2013-09-18 ENCOUNTER — Ambulatory Visit: Payer: Medicare Other

## 2013-10-04 ENCOUNTER — Ambulatory Visit: Payer: Medicare Other

## 2013-10-18 ENCOUNTER — Ambulatory Visit (INDEPENDENT_AMBULATORY_CARE_PROVIDER_SITE_OTHER): Payer: Medicare Other

## 2013-10-18 VITALS — BP 119/63 | HR 67 | Resp 16 | Ht 63.0 in | Wt 124.0 lb

## 2013-10-18 DIAGNOSIS — M898X9 Other specified disorders of bone, unspecified site: Secondary | ICD-10-CM

## 2013-10-18 DIAGNOSIS — M67472 Ganglion, left ankle and foot: Secondary | ICD-10-CM

## 2013-10-18 DIAGNOSIS — M2030 Hallux varus (acquired), unspecified foot: Secondary | ICD-10-CM

## 2013-10-18 DIAGNOSIS — M6740 Ganglion, unspecified site: Secondary | ICD-10-CM

## 2013-10-18 DIAGNOSIS — M205X2 Other deformities of toe(s) (acquired), left foot: Secondary | ICD-10-CM

## 2013-10-18 NOTE — Patient Instructions (Signed)
Pre-Operative Instructions  Congratulations, you have decided to take an important step to improving your quality of life.  You can be assured that the doctors of [facility] will be with you every step of the way.  1. Plan to be at the surgery center/hospital at least 1 (one) hour prior to your scheduled time unless otherwise directed by the surgical center/hospital staff.  You must have a responsible adult accompany you, remain during the surgery and drive you home.  Make sure you have directions to the surgical center/hospital and know how to get there on time. 2. For hospital based surgery you will need to obtain a history and physical form from your family physician within 1 month prior to the date of surgery- we will give you a form for you primary physician.  3. We make every effort to accommodate the date you request for surgery.  There are however, times where surgery dates or times have to be moved.  We will contact you as soon as possible if a change in schedule is required.   4. No Aspirin/Ibuprofen for one week before surgery.  If you are on aspirin, any non-steroidal anti-inflammatory medications (Mobic, Aleve, Ibuprofen) you should stop taking it 7 days prior to your surgery.  You make take Tylenol  For pain prior to surgery.  5. Medications- If you are taking daily heart and blood pressure medications, seizure, reflux, allergy, asthma, anxiety, pain or diabetes medications, make sure the surgery center/hospital is aware before the day of surgery so they may notify you which medications to take or avoid the day of surgery. 6. No food or drink after midnight the night before surgery unless directed otherwise by surgical center/hospital staff. 7. No alcoholic beverages 24 hours prior to surgery.  No smoking 24 hours prior to or 24 hours after surgery. 8. Wear loose pants or shorts- loose enough to fit over bandages, boots, and casts. 9. No slip on shoes, sneakers are best. 10. Bring  your boot with you to the surgery center/hospital.  Also bring crutches or a walker if your physician has prescribed it for you.  If you do not have this equipment, it will be provided for you after surgery. 11. If you have not been contracted by the surgery center/hospital by the day before your surgery, call to confirm the date and time of your surgery. 12. Leave-time from work may vary depending on the type of surgery you have.  Appropriate arrangements should be made prior to surgery with your employer. 13. Prescriptions will be provided immediately following surgery by your doctor.  Have these filled as soon as possible after surgery and take the medication as directed. 14. Remove nail polish on the operative foot. 15. Wash the night before surgery.  The night before surgery wash the foot and leg well with the antibacterial soap provided and water paying special attention to beneath the toenails and in between the toes.  Rinse thoroughly with water and dry well with a towel.  Perform this wash unless told not to do so by your physician.  Enclosed: 1 Ice pack (please put in freezer the night before surgery)   1 Hibiclens skin cleaner   Pre-op Instructions  If you have any questions regarding the instructions, do not hesitate to call our office.  [place]: [address] [place], [place] [address] [phone]  [place]: [address] [place], [place] [address] [phone]  [place]: [address]  [place], [place] [address] [phone]  Dr. Deontrey Massi   Tuchman DPM, Dr. Norman Regal DPM Dr. Roderica Cathell DPM, Dr. M. Todd Hyatt DPM, Dr. Kathryn Egerton DPM 

## 2013-10-18 NOTE — Progress Notes (Signed)
   Subjective:    Patient ID: Taylor Taylor, female    DOB: [birth year], 68 y.o.   MRN: [id number]  HPI Comments: "I have seen a doctor here for this cyst about a year ago and it was drained. Now its back and larger."  Follow up cyst 2nd toe left - LOV 09-14-2012    Toe Pain       Review of Systems  All other systems reviewed and are negative.      Objective:   Physical Exam Lower extremity objective findings as follows patient presents this time with recurrence of the cyst which was aspirated about a year ago likely by Dr. Gilford Silvius and at that time however continues to have recurrence the lesion came right back and use a painful symptomatic distal IP joint distal lateral second digit left foot. Patient also slight bunion deformity which is asymptomatic her have some contracture of the IP joint of the second digit with slight mallet toe deformity and osteoarthropathy that joint. X-rays taken at this time reveals slight osteophyte and mild arthropathy the distal IP joint of the second digit mild dorsal spurring of the first MTP joint noted as well with mild arthropathy and HAV deformity noted. No fractures cyst or tumors are noted there is a soft tissue lesion consistent with a mucoid or ganglion cyst second digit left foot. 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 Plan at this time is for possible surgical intervention having failed conservative care aspiration consent form for excision of ganglion or mucoid cyst second digit left foot is carried out as well as possibly ostectomy for arthritic her painful exostosis second digit middle  phalanx. Surgery scheduled at this time for excision of cyst and ostectomy second digit left  foot.  Harriet Masson DPM

## 2013-10-21 DIAGNOSIS — M6740 Ganglion, unspecified site: Secondary | ICD-10-CM

## 2013-10-21 DIAGNOSIS — M898X9 Other specified disorders of bone, unspecified site: Secondary | ICD-10-CM

## 2013-10-22 ENCOUNTER — Telehealth: Payer: Self-pay

## 2013-10-22 NOTE — Telephone Encounter (Signed)
Spoke with pt regarding post operative status, she states she is doing well with managing her pain, advised to wear boot and keep sterile dressing dry and to call with questions or concerns

## 2013-10-25 NOTE — Progress Notes (Signed)
Dr Blenda Mounts performed a Exc ganglion 2nd left met on 10/21/13. Prescribed hydrocodone 5/364m #30 1-2 tabs q6hrs prn pain. Cephalexin 5081m#6 1 tab q6hrs

## 2013-10-29 ENCOUNTER — Ambulatory Visit (INDEPENDENT_AMBULATORY_CARE_PROVIDER_SITE_OTHER): Payer: Medicare Other

## 2013-10-29 VITALS — BP 116/64 | HR 78 | Resp 14 | Ht 63.0 in | Wt 124.0 lb

## 2013-10-29 DIAGNOSIS — Z09 Encounter for follow-up examination after completed treatment for conditions other than malignant neoplasm: Secondary | ICD-10-CM

## 2013-10-29 DIAGNOSIS — M6740 Ganglion, unspecified site: Secondary | ICD-10-CM

## 2013-10-29 DIAGNOSIS — M67472 Ganglion, left ankle and foot: Secondary | ICD-10-CM

## 2013-10-29 NOTE — Progress Notes (Signed)
   Subjective:    Patient ID: Taylor Taylor, female    DOB: 28-Jun-1945, 68 y.o.   MRN: [id number]  HPI Comments: DOS 10/21/2013.  Pt states she's doing good, having a very hard time staying off her feet with the garden coming in.  Pt's 2nd left toe and surgical foot is not swollen, but the 2nd to is red at the suture line.     Review of Systems no new findings or systemic changes noted     Objective:   Physical Exam Neurovascular status is intact pedal pulses palpable incision clean dry well coapted sutures intact patient is status post excision of ganglion distal IP joint second digit left foot presto compressive dressing was reapplied at this time maintain moderate activity tibial tendon 15 minutes per hour increased activities slowly maintain surgical shoe at all times for at least 3-4 more weeks as instructed. Good color pulses are palpable no complaints of pain or discomfort sticky does complain, for pain       Assessment & Plan:  Assessment good postop progress minimal edema minimal ecchymosis minimal or no pain noted presto dressing reapplied maintain his shoes and dressing reappointed one week plan for suture removal. Patient otherwise seems to be doing well Do some more activities and hours ice to guarded is any increased pain or discomfort to take a break in elevate and ice when possible reappointed one week for suture removal  Harriet Masson DPM

## 2013-10-29 NOTE — Patient Instructions (Signed)

## 2013-11-06 ENCOUNTER — Ambulatory Visit (INDEPENDENT_AMBULATORY_CARE_PROVIDER_SITE_OTHER): Payer: Medicare Other

## 2013-11-06 VITALS — BP 124/74 | HR 85 | Resp 12 | Ht 63.0 in | Wt 124.0 lb

## 2013-11-06 DIAGNOSIS — Z09 Encounter for follow-up examination after completed treatment for conditions other than malignant neoplasm: Secondary | ICD-10-CM

## 2013-11-06 DIAGNOSIS — M205X2 Other deformities of toe(s) (acquired), left foot: Secondary | ICD-10-CM

## 2013-11-06 DIAGNOSIS — M6740 Ganglion, unspecified site: Secondary | ICD-10-CM

## 2013-11-06 DIAGNOSIS — M2030 Hallux varus (acquired), unspecified foot: Secondary | ICD-10-CM

## 2013-11-06 DIAGNOSIS — M67472 Ganglion, left ankle and foot: Secondary | ICD-10-CM

## 2013-11-06 NOTE — Patient Instructions (Signed)
ICE INSTRUCTIONS  Apply ice or cold pack to the affected area at least 3 times a day for 10-15 minutes each time.  You should also use ice after prolonged activity or vigorous exercise.  Do not apply ice longer than 20 minutes at one time.  Always keep a cloth between your skin and the ice pack to prevent burns.  Being consistent and following these instructions will help control your symptoms.  We suggest you purchase a gel ice pack because they are reusable and do bit leak.  Some of them are designed to wrap around the area.  Use the method that works best for you.  Here are some other suggestions for icing.   Use a frozen bag of peas or corn-inexpensive and molds well to your body, usually stays frozen for 10 to 20 minutes.  Wet a towel with cold water and squeeze out the excess until it's damp.  Place in a bag in the freezer for 20 minutes. Then remove and use.  Maintain Coflex wrap of toe to help keep down the swelling and to cushion the toe from an injury

## 2013-11-06 NOTE — Progress Notes (Signed)
   Subjective:    Patient ID: Taylor Taylor, female    DOB: 06-25-1945, 68 y.o.   MRN: [id number]  HPI Comments: DOS 10/21/2013   Pt states she had not pain since the last office visit.  I removed 4 suture from the left 2nd toe.     Review of Systems no new findings or systemic changes noted     Objective:   Physical Exam Or vascular status is intact pedal pulses palpable dressings intact and dry. Suture removal second toe at this time the incision appears to be well coapted Neosporin Coflex wrap applied to toe maintain Coflex wrap in for the next 3 or 4 weeks as instructed maintain appropriate accommodative shoes or sandals are clawed consider crocs for around the house       Assessment & Plan:  Assessment good postop progress incision well coapted mild edema and ecchymosis consistent with postop course maintain surgical shoe to as we can and switched other accommodative shoes maintain wrapping recheck in one month for further followup and x-ray  Harriet Masson DPM

## 2013-12-06 ENCOUNTER — Ambulatory Visit (INDEPENDENT_AMBULATORY_CARE_PROVIDER_SITE_OTHER): Payer: Medicare Other

## 2013-12-06 VITALS — BP 136/73 | HR 77 | Resp 16

## 2013-12-06 DIAGNOSIS — M67472 Ganglion, left ankle and foot: Secondary | ICD-10-CM

## 2013-12-06 DIAGNOSIS — M6740 Ganglion, unspecified site: Secondary | ICD-10-CM

## 2013-12-06 DIAGNOSIS — M2042 Other hammer toe(s) (acquired), left foot: Secondary | ICD-10-CM

## 2013-12-06 DIAGNOSIS — Z09 Encounter for follow-up examination after completed treatment for conditions other than malignant neoplasm: Secondary | ICD-10-CM

## 2013-12-06 DIAGNOSIS — M2040 Other hammer toe(s) (acquired), unspecified foot: Secondary | ICD-10-CM

## 2013-12-06 NOTE — Patient Instructions (Signed)
ICE INSTRUCTIONS  Apply ice or cold pack to the affected area at least 3 times a day for 10-15 minutes each time.  You should also use ice after prolonged activity or vigorous exercise.  Do not apply ice longer than 20 minutes at one time.  Always keep a cloth between your skin and the ice pack to prevent burns.  Being consistent and following these instructions will help control your symptoms.  We suggest you purchase a gel ice pack because they are reusable and do bit leak.  Some of them are designed to wrap around the area.  Use the method that works best for you.  Here are some other suggestions for icing.   Use a frozen bag of peas or corn-inexpensive and molds well to your body, usually stays frozen for 10 to 20 minutes.  Wet a towel with cold water and squeeze out the excess until it's damp.  Place in a bag in the freezer for 20 minutes. Then remove and use.  Maintain Coflex wrap in of toe to maintain position and reduce swelling

## 2013-12-06 NOTE — Progress Notes (Signed)
   Subjective:    Patient ID: Taylor Taylor, female    DOB: 07-Sep-1945, 68 y.o.   MRN: [id number]  HPI Comments: "Its still sore some"  DOS 10-21-2013 POV Exostectomy and excision 2nd toe left     Review of Systems no new findings or systemic changes noted     Objective:   Physical Exam Neurovascular status is intact pedal pulses palpable incision well coapted second toe left foot still some mild edema noted consistent with postop course is 6 week status post excision of this ganglion cyst and partial phalangectomy of the phalanx second digit. Maintain Coflex wrap it is recommended x-rays revealed good position of the osteotomy and good consolidation of the surgical site       Assessment & Plan:  Assessment good postop progress no signs of regrowth of cyst or ganglion mild edema still present maintain Coflex wrap in the buddy wrap in the toes recheck in 2 months for long-term followup and x-ray  Harriet Masson DPM

## 2014-01-07 ENCOUNTER — Ambulatory Visit (INDEPENDENT_AMBULATORY_CARE_PROVIDER_SITE_OTHER): Payer: Medicare Other | Admitting: *Deleted

## 2014-01-07 DIAGNOSIS — Z23 Encounter for immunization: Secondary | ICD-10-CM

## 2014-02-11 ENCOUNTER — Ambulatory Visit: Payer: Medicare Other

## 2014-03-04 ENCOUNTER — Ambulatory Visit: Payer: Medicare Other

## 2014-04-08 ENCOUNTER — Encounter: Payer: Self-pay | Admitting: Cardiovascular Disease

## 2014-04-15 ENCOUNTER — Encounter: Payer: Self-pay | Admitting: Family Medicine

## 2014-04-15 ENCOUNTER — Ambulatory Visit (INDEPENDENT_AMBULATORY_CARE_PROVIDER_SITE_OTHER): Payer: PPO | Admitting: Family Medicine

## 2014-04-15 VITALS — BP 118/70 | HR 76 | Temp 98.3°F | Resp 14 | Ht 64.0 in | Wt 132.0 lb

## 2014-04-15 DIAGNOSIS — R197 Diarrhea, unspecified: Secondary | ICD-10-CM

## 2014-04-15 DIAGNOSIS — J029 Acute pharyngitis, unspecified: Secondary | ICD-10-CM

## 2014-04-15 LAB — RAPID STREP SCREEN (MED CTR MEBANE ONLY): Streptococcus, Group A Screen (Direct): NEGATIVE

## 2014-04-15 MED ORDER — AMOXICILLIN 875 MG PO TABS: 875.0000 mg | ORAL_TABLET | Freq: Two times a day (BID) | ORAL | Status: DC

## 2014-04-15 NOTE — Progress Notes (Signed)
Subjective:    Patient ID: Taylor Taylor, female    DOB: 1945/12/14, 69 y.o.   MRN: [id number]  HPI Patient is a very pleasant 69 year old white female presents today complaining of a sore throat for 48 hours. The pain is limited to the left side of her throat. She also has some tender lymphadenopathy in the left anterior cervical chain. She reports some mild diarrhea in the morning. She also has a slight runny nose. She denies any fevers or chills. She denies any trismus. She denies any known exposure to strep or mono. She denies any myalgias or cough.  She denies any rash. Past Medical History  Diagnosis Date  . GERD (gastroesophageal reflux disease)   . Allergy     Seasonal  . Arthritis    No past surgical history on file. Current Outpatient Prescriptions on File Prior to Visit  Medication Sig Dispense Refill  . calcium carbonate (OS-CAL) 600 MG TABS tablet Take 600 mg by mouth 2 (two) times daily with a meal.    . diclofenac (VOLTAREN) 75 MG EC tablet Take 75 mg by mouth 2 (two) times daily.    Marland Kitchen estradiol (ESTRACE) 1 MG tablet Take 1 mg by mouth daily.    . ferrous sulfate 325 (65 FE) MG tablet Take 325 mg by mouth daily with breakfast.    . Fish Oil-Cholecalciferol (FISH OIL + D3) 1200-1000 MG-UNIT CAPS Take 1 tablet by mouth daily.    . Glucosamine-Chondroit-Vit C-Mn (GLUCOSAMINE CHONDR 1500 COMPLX) CAPS Take 1 capsule by mouth 2 (two) times daily.    . Multiple Vitamins-Minerals (WOMENS MULTI VITAMIN & MINERAL PO) Take 1 tablet by mouth daily.    . progesterone (PROMETRIUM) 100 MG capsule Take 100 mg by mouth daily.    . fluticasone (FLONASE) 50 MCG/ACT nasal spray Place 2 sprays into both nostrils daily. (Patient not taking: Reported on 04/15/2014) 48 g 4   No current facility-administered medications on file prior to visit.   No Known Allergies History   Social History  . Marital Status: Married    Spouse Name: N/A    Number of Children: N/A  . Years of Education: N/A    Occupational History  . Not on file.   Social History Main Topics  . Smoking status: Never Smoker   . Smokeless tobacco: Never Used  . Alcohol Use: No  . Drug Use: No  . Sexual Activity: Not on file   Other Topics Concern  . Not on file   Social History Narrative      Review of Systems  All other systems reviewed and are negative.      Objective:   Physical Exam  Constitutional: She appears well-developed and well-nourished.  HENT:  Right Ear: Tympanic membrane, external ear and ear canal normal.  Left Ear: Tympanic membrane, external ear and ear canal normal.  Nose: Nose normal.  Mouth/Throat: Posterior oropharyngeal erythema present. No oropharyngeal exudate.  Neck: Neck supple.  Cardiovascular: Normal rate, regular rhythm and normal heart sounds.   Pulmonary/Chest: Effort normal and breath sounds normal. No respiratory distress. She has no wheezes. She has no rales.  Lymphadenopathy:    She has cervical adenopathy.  Vitals reviewed.         Assessment & Plan:  Sore throat - Plan: Rapid strep screen  Diarrhea  Patient strep screen today is negative. This is most likely a viral pharyngitis. I recommended tincture of time for the next 5 days with supportive care including chloraseptic.

## 2014-04-18 ENCOUNTER — Telehealth: Payer: Self-pay | Admitting: Family Medicine

## 2014-04-18 NOTE — Telephone Encounter (Signed)
Patient was in to see dr pickard and says that her throat is no better would like to know if we can call in magic [facility] [address] if possible  Please call her at [phone]

## 2014-04-21 MED ORDER — FIRST-BXN MOUTHWASH MT SUSP: 5.0000 mL | OROMUCOSAL | Status: DC

## 2014-04-21 NOTE — Telephone Encounter (Signed)
Absolutely, 1 tsp poq4 hrs prn sore throat, but if worse, would also get amoxicillin 875 bid for 10 days.

## 2014-04-21 NOTE — Telephone Encounter (Signed)
Mouth wash sent to pharm and pt aware via vm

## 2014-05-13 ENCOUNTER — Ambulatory Visit (INDEPENDENT_AMBULATORY_CARE_PROVIDER_SITE_OTHER): Payer: PPO

## 2014-05-13 DIAGNOSIS — M2042 Other hammer toe(s) (acquired), left foot: Secondary | ICD-10-CM

## 2014-05-13 DIAGNOSIS — M205X2 Other deformities of toe(s) (acquired), left foot: Secondary | ICD-10-CM

## 2014-05-13 DIAGNOSIS — G5762 Lesion of plantar nerve, left lower limb: Secondary | ICD-10-CM

## 2014-05-13 DIAGNOSIS — M792 Neuralgia and neuritis, unspecified: Secondary | ICD-10-CM

## 2014-05-13 NOTE — Progress Notes (Signed)
   Subjective:    Patient ID: Taylor Taylor, female    DOB: 1946-03-17, 69 y.o.   MRN: [id number]  HPI patient presents this time with a new complaint having some shooting sensation to the second toe left foot where she is previously had mallet toe surgery excision of ganglion cyst    Review of Systems no new findings or systemic changes noted     Objective:   Physical Exam  Neurovascular status is intact pedal pulses are palpable there is no signs of infection no discharge x-rays reveal good alignment of the toes tarsal middle phalangectomy having been performed this digit. The rectus good range of motion no refusal pain tender symptomology direct lateral compression or movement of the second toe left there is some tenderness the second interspace positive Mulder sign or early Shamrock sign for possible neuroma second interspace. No history of injury or trauma although patient does have generalized osteopenic changes mild osteoarthropathy and posse some mild atrophy of fat pad.      Assessment & Plan:  Assessment based on clinical regarding findings posteriorly early neuroma patient does have slight radiculopathies had some liver issues and some sciatic-type issues in the past which may be contributing to this as well we'll monitor for exacerbations recommended avoiding going barefoot thick padding shoe at all times also an NSAID and ice may be beneficial if continues in the future follow-up may be steroid injection may be beneficial  Harriet Masson DPM

## 2014-05-13 NOTE — Patient Instructions (Signed)
Morton's Neuroma Neuralgia (nerve pain) or neuroma (benign [non-cancerous] nerve tumor) may develop on any interdigital nerve. The interdigital nerves (nerves between digits) of the foot travel beneath and between the metatarsals (long bones of the fore foot) and pass the nerve endings to the toes. The third interdigital is a common place for a small neuroma to form called Morton's neuroma. Another nerve to be affected commonly is the fourth interdigital nerve. This would be in approximately in the area of the base or ball under the bottom of your fourth toe. This condition occurs more commonly in women and is usually on one side. It is usually first noticed by pain radiating (spreading) to the ball of the foot or to the toes. CAUSES The cause of interdigital neuralgia may be from low grade repetitive trauma (damage caused by an accident) as in activities causing a repeated pounding of the foot (running, jumping etc.). It is also caused by improper footwear or recent loss of the fatty padding on the bottom of the foot. TREATMENT  The condition often resolves (goes away) simply with decreasing activity if that is thought to be the cause. Proper shoes are beneficial. Orthotics (special foot support aids) such as a metatarsal bar are often beneficial. This condition usually responds to conservative therapy, however if surgery is necessary it usually brings complete relief. HOME CARE INSTRUCTIONS   Apply ice to the area of soreness for 15-20 minutes, 03-04 times per day, while awake for the first 2 days. Put ice in a plastic bag and place a towel between the bag of ice and your skin.  Only take over-the-counter or prescription medicines for pain, discomfort, or fever as directed by your caregiver. MAKE SURE YOU:   Understand these instructions.  Will watch your condition.  Will get help right away if you are not doing well or get worse. Document Released: 06/13/2000 Document Revised: 05/30/2011  Document Reviewed: 05/08/2013 [facility] Patient Information 2015 [place], [place]. This information is not intended to replace advice given to you by your health care provider. Make sure you discuss any questions you have with your health care provider.

## 2014-06-23 ENCOUNTER — Other Ambulatory Visit: Payer: Self-pay

## 2014-06-23 DIAGNOSIS — Z1231 Encounter for screening mammogram for malignant neoplasm of breast: Secondary | ICD-10-CM

## 2014-07-02 ENCOUNTER — Other Ambulatory Visit: Payer: Self-pay | Admitting: Orthopaedic Surgery

## 2014-07-02 ENCOUNTER — Ambulatory Visit
Admission: RE | Admit: 2014-07-02 | Discharge: 2014-07-02 | Disposition: A | Discharge status: Home / Self Care | Payer: PPO | Source: Ambulatory Visit

## 2014-07-02 DIAGNOSIS — Z1231 Encounter for screening mammogram for malignant neoplasm of breast: Secondary | ICD-10-CM

## 2014-07-02 DIAGNOSIS — M79672 Pain in left foot: Secondary | ICD-10-CM

## 2014-07-03 ENCOUNTER — Ambulatory Visit
Admission: RE | Admit: 2014-07-03 | Discharge: 2014-07-03 | Disposition: A | Discharge status: Home / Self Care | Payer: PPO | Source: Ambulatory Visit | Attending: Orthopaedic Surgery | Admitting: Orthopaedic Surgery

## 2014-07-03 DIAGNOSIS — M79672 Pain in left foot: Secondary | ICD-10-CM

## 2014-07-14 ENCOUNTER — Other Ambulatory Visit: Payer: PPO

## 2014-07-17 ENCOUNTER — Ambulatory Visit (INDEPENDENT_AMBULATORY_CARE_PROVIDER_SITE_OTHER): Payer: PPO | Admitting: Family Medicine

## 2014-07-17 ENCOUNTER — Other Ambulatory Visit: Payer: PPO

## 2014-07-17 DIAGNOSIS — E785 Hyperlipidemia, unspecified: Secondary | ICD-10-CM

## 2014-07-17 DIAGNOSIS — Z23 Encounter for immunization: Secondary | ICD-10-CM

## 2014-07-17 DIAGNOSIS — Z79899 Other long term (current) drug therapy: Secondary | ICD-10-CM

## 2014-07-17 LAB — CBC WITH DIFFERENTIAL/PLATELET
Basophils Absolute: 0 10*3/uL (ref 0.0–0.1)
Basophils Relative: 1 % (ref 0–1)
Eosinophils Absolute: 0.2 10*3/uL (ref 0.0–0.7)
Eosinophils Relative: 5 % (ref 0–5)
HCT: 36.3 % (ref 36.0–46.0)
Hemoglobin: 11.8 g/dL — ABNORMAL LOW (ref 12.0–15.0)
Lymphocytes Relative: 42 % (ref 12–46)
Lymphs Abs: 1.9 10*3/uL (ref 0.7–4.0)
MCH: 30.2 pg (ref 26.0–34.0)
MCHC: 32.5 g/dL (ref 30.0–36.0)
MCV: 92.8 fL (ref 78.0–100.0)
MPV: 9.2 fL (ref 8.6–12.4)
Monocytes Absolute: 0.5 10*3/uL (ref 0.1–1.0)
Monocytes Relative: 10 % (ref 3–12)
Neutro Abs: 1.9 10*3/uL (ref 1.7–7.7)
Neutrophils Relative %: 42 % — ABNORMAL LOW (ref 43–77)
Platelets: 293 10*3/uL (ref 150–400)
RBC: 3.91 MIL/uL (ref 3.87–5.11)
RDW: 13.8 % (ref 11.5–15.5)
WBC: 4.5 10*3/uL (ref 4.0–10.5)

## 2014-07-17 LAB — COMPREHENSIVE METABOLIC PANEL
ALT: 15 U/L (ref 0–35)
AST: 17 U/L (ref 0–37)
Albumin: 4.2 g/dL (ref 3.5–5.2)
Alkaline Phosphatase: 28 U/L — ABNORMAL LOW (ref 39–117)
BUN: 38 mg/dL — ABNORMAL HIGH (ref 6–23)
CO2: 27 mEq/L (ref 19–32)
Calcium: 9.1 mg/dL (ref 8.4–10.5)
Chloride: 105 mEq/L (ref 96–112)
Creat: 0.84 mg/dL (ref 0.50–1.10)
Glucose, Bld: 91 mg/dL (ref 70–99)
Potassium: 5 mEq/L (ref 3.5–5.3)
Sodium: 139 mEq/L (ref 135–145)
Total Bilirubin: 0.6 mg/dL (ref 0.2–1.2)
Total Protein: 6.4 g/dL (ref 6.0–8.3)

## 2014-07-17 LAB — LIPID PANEL
Cholesterol: 182 mg/dL (ref 0–200)
HDL: 89 mg/dL (ref >=46)
LDL Cholesterol: 84 mg/dL (ref 0–99)
Total CHOL/HDL Ratio: 2 Ratio
Triglycerides: 43 mg/dL (ref <150)
VLDL: 9 mg/dL (ref 0–40)

## 2014-07-21 ENCOUNTER — Encounter: Payer: Self-pay | Admitting: Family Medicine

## 2014-07-21 ENCOUNTER — Ambulatory Visit (INDEPENDENT_AMBULATORY_CARE_PROVIDER_SITE_OTHER): Payer: PPO | Admitting: Family Medicine

## 2014-07-21 VITALS — BP 108/62 | HR 68 | Temp 98.1°F | Resp 16 | Ht 64.0 in | Wt 130.0 lb

## 2014-07-21 DIAGNOSIS — Z79890 Hormone replacement therapy (postmenopausal): Secondary | ICD-10-CM

## 2014-07-21 DIAGNOSIS — M545 Low back pain, unspecified: Secondary | ICD-10-CM

## 2014-07-21 MED ORDER — PROGESTERONE MICRONIZED 100 MG PO CAPS: 100.0000 mg | ORAL_CAPSULE | Freq: Every day | ORAL | Status: DC

## 2014-07-21 MED ORDER — ESTRADIOL 1 MG PO TABS: 1.0000 mg | ORAL_TABLET | Freq: Every day | ORAL | Status: DC

## 2014-07-21 MED ORDER — DICLOFENAC SODIUM 75 MG PO TBEC: 75.0000 mg | DELAYED_RELEASE_TABLET | Freq: Two times a day (BID) | ORAL | Status: DC

## 2014-07-21 NOTE — Progress Notes (Signed)
Subjective:    Patient ID: Taylor Taylor, female    DOB: [birth year], 69 y.o.   MRN: [id number]  HPI  Patient takes diclofenac 75 mg by mouth twice a day for low back pain as well as bilateral knee pain which is attributed arthritis. She's been taking this medication for years. She would like a refill on this medication. She is also on hormone replacement therapy. She takes 1 mg of Estrace every day along with progesterone 100 mg by mouth daily for protection from endometrial carcinoma secondary to estrogen replacement. She's been receiving this from her gynecologist. She would like [place] to refill is here today. She takes it only for hot flashes. She was not aware of the risk of hormone replacement therapy including strokes, blood clot, and breast cancer. Lab on 07/17/2014  Component Date Value Ref Range Status  . WBC 07/17/2014 4.5  4.0 - 10.5 K/uL Final  . RBC 07/17/2014 3.91  3.87 - 5.11 MIL/uL Final  . Hemoglobin 07/17/2014 11.8* 12.0 - 15.0 g/dL Final  . HCT 07/17/2014 36.3  36.0 - 46.0 % Final  . MCV 07/17/2014 92.8  78.0 - 100.0 fL Final  . MCH 07/17/2014 30.2  26.0 - 34.0 pg Final  . MCHC 07/17/2014 32.5  30.0 - 36.0 g/dL Final  . RDW 07/17/2014 13.8  11.5 - 15.5 % Final  . Platelets 07/17/2014 293  150 - 400 K/uL Final  . MPV 07/17/2014 9.2  8.6 - 12.4 fL Final  . Neutrophils Relative % 07/17/2014 42* 43 - 77 % Final  . Neutro Abs 07/17/2014 1.9  1.7 - 7.7 K/uL Final  . Lymphocytes Relative 07/17/2014 42  12 - 46 % Final  . Lymphs Abs 07/17/2014 1.9  0.7 - 4.0 K/uL Final  . Monocytes Relative 07/17/2014 10  3 - 12 % Final  . Monocytes Absolute 07/17/2014 0.5  0.1 - 1.0 K/uL Final  . Eosinophils Relative 07/17/2014 5  0 - 5 % Final  . Eosinophils Absolute 07/17/2014 0.2  0.0 - 0.7 K/uL Final  . Basophils Relative 07/17/2014 1  0 - 1 % Final  . Basophils Absolute 07/17/2014 0.0  0.0 - 0.1 K/uL Final  . Smear Review 07/17/2014 Criteria for review not met   Final  . Sodium  07/17/2014 139  135 - 145 mEq/L Final  . Potassium 07/17/2014 5.0  3.5 - 5.3 mEq/L Final  . Chloride 07/17/2014 105  96 - 112 mEq/L Final  . CO2 07/17/2014 27  19 - 32 mEq/L Final  . Glucose, Bld 07/17/2014 91  70 - 99 mg/dL Final  . BUN 07/17/2014 38* 6 - 23 mg/dL Final  . Creat 07/17/2014 0.84  0.50 - 1.10 mg/dL Final  . Total Bilirubin 07/17/2014 0.6  0.2 - 1.2 mg/dL Final  . Alkaline Phosphatase 07/17/2014 28* 39 - 117 U/L Final  . AST 07/17/2014 17  0 - 37 U/L Final  . ALT 07/17/2014 15  0 - 35 U/L Final  . Total Protein 07/17/2014 6.4  6.0 - 8.3 g/dL Final  . Albumin 07/17/2014 4.2  3.5 - 5.2 g/dL Final  . Calcium 07/17/2014 9.1  8.4 - 10.5 mg/dL Final  . Cholesterol 07/17/2014 182  0 - 200 mg/dL Final   Comment: ATP III Classification:       < 200        mg/dL        Desirable      200 - 239     mg/dL  Borderline High      >= 240        mg/dL        High     . Triglycerides 07/17/2014 43  <150 mg/dL Final  . HDL 07/17/2014 89  >=46 mg/dL Final   ** Please note change in reference range(s). **  . Total CHOL/HDL Ratio 07/17/2014 2.0   Final  . VLDL 07/17/2014 9  0 - 40 mg/dL Final  . LDL Cholesterol 07/17/2014 84  0 - 99 mg/dL Final   Comment:   Total Cholesterol/HDL Ratio:CHD Risk                        Coronary Heart Disease Risk Table                                        Men       Women          1/2 Average Risk              3.4        3.3              Average Risk              5.0        4.4           2X Average Risk              9.6        7.1           3X Average Risk             23.4       11.0 Use the calculated Patient Ratio above and the CHD Risk table  to determine the patient's CHD Risk. ATP III Classification (LDL):       < 100        mg/dL         Optimal      100 - 129     mg/dL         Near or Above Optimal      130 - 159     mg/dL         Borderline High      160 - 189     mg/dL         High       > 190        mg/dL         Very High       Past Medical History  Diagnosis Date  . GERD (gastroesophageal reflux disease)   . Allergy     Seasonal  . Arthritis    No past surgical history on file. Current Outpatient Prescriptions on File Prior to Visit  Medication Sig Dispense Refill  . calcium carbonate (OS-CAL) 600 MG TABS tablet Take 600 mg by mouth 2 (two) times daily with a meal.    . ferrous sulfate 325 (65 FE) MG tablet Take 325 mg by mouth daily with breakfast.    . Fish Oil-Cholecalciferol (FISH OIL + D3) 1200-1000 MG-UNIT CAPS Take 1 tablet by mouth daily.    . Glucosamine-Chondroit-Vit C-Mn (GLUCOSAMINE CHONDR 1500 COMPLX) CAPS Take 1 capsule by mouth 2 (two) times daily.    . Multiple Vitamins-Minerals (WOMENS MULTI VITAMIN & MINERAL PO) Take 1  tablet by mouth daily.    . fluticasone (FLONASE) 50 MCG/ACT nasal spray Place 2 sprays into both nostrils daily. (Patient not taking: Reported on 04/15/2014) 48 g 4   No current facility-administered medications on file prior to visit.   No Known Allergies History   Social History  . Marital Status: Married    Spouse Name: N/A  . Number of Children: N/A  . Years of Education: N/A   Occupational History  . Not on file.   Social History Main Topics  . Smoking status: Never Smoker   . Smokeless tobacco: Never Used  . Alcohol Use: No  . Drug Use: No  . Sexual Activity: Not on file   Other Topics Concern  . Not on file   Social History Narrative     Review of Systems  All other systems reviewed and are negative.      Objective:   Physical Exam  Constitutional: She appears well-developed and well-nourished.  Cardiovascular: Normal rate, regular rhythm and normal heart sounds.   No murmur heard. Pulmonary/Chest: Effort normal and breath sounds normal. No respiratory distress. She has no wheezes. She has no rales. She exhibits no tenderness.  Abdominal: Soft. Bowel sounds are normal. She exhibits no distension. There is no tenderness. There is no rebound  and no guarding.  Musculoskeletal: She exhibits no edema.  Vitals reviewed.         Assessment & Plan:  Midline low back pain without sciatica  Postmenopausal HRT (hormone replacement therapy)  I explained the risk of hormone replacement therapy including a small risk of breast cancer, DVT, and stroke. Also explained the risk of long-term NSAID use including peptic ulcer disease. Patient is willing to accept these risks. She will try to minimize her use of diclofenac in the future. She will also try to slowly wean herself off Estrace and replace it with black cohosh. Mammogram is up-to-date, Pap smear is up-to-date, and colonoscopy is up-to-date.

## 2014-07-29 ENCOUNTER — Other Ambulatory Visit: Payer: Self-pay | Admitting: Family Medicine

## 2014-07-29 MED ORDER — ESTRADIOL 1 MG PO TABS: 1.0000 mg | ORAL_TABLET | Freq: Every day | ORAL | Status: DC

## 2014-11-18 ENCOUNTER — Ambulatory Visit (INDEPENDENT_AMBULATORY_CARE_PROVIDER_SITE_OTHER): Payer: PPO | Admitting: Family Medicine

## 2014-11-18 ENCOUNTER — Encounter: Payer: Self-pay | Admitting: Family Medicine

## 2014-11-18 VITALS — BP 90/52 | HR 60 | Temp 98.3°F | Resp 16 | Ht 64.0 in | Wt 129.0 lb

## 2014-11-18 DIAGNOSIS — K921 Melena: Secondary | ICD-10-CM | POA: Diagnosis not present

## 2014-11-18 DIAGNOSIS — R5383 Other fatigue: Secondary | ICD-10-CM

## 2014-11-18 LAB — CBC WITH DIFFERENTIAL/PLATELET
Basophils Absolute: 0 10*3/uL (ref 0.0–0.1)
Basophils Relative: 0 % (ref 0–1)
Eosinophils Absolute: 0.2 10*3/uL (ref 0.0–0.7)
Eosinophils Relative: 4 % (ref 0–5)
HCT: 37.3 % (ref 36.0–46.0)
Hemoglobin: 12.5 g/dL (ref 12.0–15.0)
Lymphocytes Relative: 40 % (ref 12–46)
Lymphs Abs: 2.3 10*3/uL (ref 0.7–4.0)
MCH: 31.3 pg (ref 26.0–34.0)
MCHC: 33.5 g/dL (ref 30.0–36.0)
MCV: 93.5 fL (ref 78.0–100.0)
MPV: 9.2 fL (ref 8.6–12.4)
Monocytes Absolute: 0.5 10*3/uL (ref 0.1–1.0)
Monocytes Relative: 8 % (ref 3–12)
Neutro Abs: 2.8 10*3/uL (ref 1.7–7.7)
Neutrophils Relative %: 48 % (ref 43–77)
Platelets: 260 10*3/uL (ref 150–400)
RBC: 3.99 MIL/uL (ref 3.87–5.11)
RDW: 13.2 % (ref 11.5–15.5)
WBC: 5.8 10*3/uL (ref 4.0–10.5)

## 2014-11-18 LAB — COMPLETE METABOLIC PANEL WITH GFR
ALT: 12 U/L (ref 6–29)
AST: 19 U/L (ref 10–35)
Albumin: 3.9 g/dL (ref 3.6–5.1)
Alkaline Phosphatase: 28 U/L — ABNORMAL LOW (ref 33–130)
BUN: 27 mg/dL — ABNORMAL HIGH (ref 7–25)
CO2: 26 mmol/L (ref 20–31)
Calcium: 9.2 mg/dL (ref 8.6–10.4)
Chloride: 100 mmol/L (ref 98–110)
Creat: 0.88 mg/dL (ref 0.50–0.99)
GFR, Est African American: 78 mL/min (ref >=60)
GFR, Est Non African American: 68 mL/min (ref >=60)
Glucose, Bld: 86 mg/dL (ref 70–99)
Potassium: 4.4 mmol/L (ref 3.5–5.3)
Sodium: 135 mmol/L (ref 135–146)
Total Bilirubin: 0.4 mg/dL (ref 0.2–1.2)
Total Protein: 6.4 g/dL (ref 6.1–8.1)

## 2014-11-18 LAB — URINALYSIS, ROUTINE W REFLEX MICROSCOPIC
Bilirubin Urine: NEGATIVE
Glucose, UA: NEGATIVE
Hgb urine dipstick: NEGATIVE
Ketones, ur: NEGATIVE
Leukocytes, UA: NEGATIVE
Nitrite: NEGATIVE
Protein, ur: NEGATIVE
Specific Gravity, Urine: 1.01 (ref 1.001–1.035)
pH: 5.5 (ref 5.0–8.0)

## 2014-11-18 LAB — HEMOGLOBIN, FINGERSTICK: Hemoglobin, fingerstick: 12 g/dL (ref 12.0–16.0)

## 2014-11-18 LAB — TSH: TSH: 1.189 u[IU]/mL (ref 0.350–4.500)

## 2014-11-18 NOTE — Progress Notes (Signed)
Subjective:    Patient ID: Taylor Taylor, female    DOB: 1946/03/10, 69 y.o.   MRN: [id number]  HPI  patient was in her normal state of health up until Friday. Saturday she went to an outdoor festival  Where was 95 and 90% humidity. Beginning later in the day Saturday she became extremely fatigued. She also developed diffuse body aches. She attributed this to heat exhaustion. However the symptoms have not improved on Sunday or Monday. She continues to complain of fatigue and malaise. She denies any fevers. She denies any chills. She denies any otalgia. She denies any sinus pain. She denies any sore throat. There is no lymphadenopathy in her neck , axilla, or inguinal canals. She denies any cough. She denies any shortness of breath. She denies any chest pain. She denies any nausea vomiting or diarrhea. She denies any rash. She denies any joint pains today. She did have one episode this morning of blood tinged stool. She denies any left lower quadrant abdominal pain. Today on her exam her abdomen is soft nondistended nontender with normal bowel sounds. I did perform a fingerstick hemoglobin today and her hemoglobin was found to be normal at greater than 12. There is no evidence of an acute lower GI bleed causing massive blood loss. Her blood pressure today is slightly low.   Urinalysis is completely normal. Past Medical History  Diagnosis Date  . GERD (gastroesophageal reflux disease)   . Allergy     Seasonal  . Arthritis    No past surgical history on file. Current Outpatient Prescriptions on File Prior to Visit  Medication Sig Dispense Refill  . calcium carbonate (OS-CAL) 600 MG TABS tablet Take 600 mg by mouth 2 (two) times daily with a meal.    . diclofenac (VOLTAREN) 75 MG EC tablet Take 1 tablet (75 mg total) by mouth 2 (two) times daily. 180 tablet 2  . estradiol (ESTRACE) 1 MG tablet Take 1 tablet (1 mg total) by mouth daily. 90 tablet 3  . ferrous sulfate 325 (65 FE) MG tablet Take 325 mg  by mouth daily with breakfast.    . Fish Oil-Cholecalciferol (FISH OIL + D3) 1200-1000 MG-UNIT CAPS Take 1 tablet by mouth daily.    . Glucosamine-Chondroit-Vit C-Mn (GLUCOSAMINE CHONDR 1500 COMPLX) CAPS Take 1 capsule by mouth 2 (two) times daily.    . Multiple Vitamins-Minerals (WOMENS MULTI VITAMIN & MINERAL PO) Take 1 tablet by mouth daily.    . progesterone (PROMETRIUM) 100 MG capsule Take 1 capsule (100 mg total) by mouth daily. 90 capsule 3  . fluticasone (FLONASE) 50 MCG/ACT nasal spray Place 2 sprays into both nostrils daily. (Patient not taking: Reported on 04/15/2014) 48 g 4   No current facility-administered medications on file prior to visit.   No Known Allergies Social History   Social History  . Marital Status: Married    Spouse Name: N/A  . Number of Children: N/A  . Years of Education: N/A   Occupational History  . Not on file.   Social History Main Topics  . Smoking status: Never Smoker   . Smokeless tobacco: Never Used  . Alcohol Use: No  . Drug Use: No  . Sexual Activity: Not on file   Other Topics Concern  . Not on file   Social History Narrative      Review of Systems  All other systems reviewed and are negative.      Objective:   Physical Exam  Constitutional: She  is oriented to person, place, and time. She appears well-developed and well-nourished. No distress.  HENT:  Head: Normocephalic and atraumatic.  Right Ear: External ear normal.  Left Ear: External ear normal.  Nose: Nose normal.  Mouth/Throat: Oropharynx is clear and moist. No oropharyngeal exudate.  Eyes: Conjunctivae and EOM are normal. Pupils are equal, round, and reactive to light.  Neck: Neck supple. No JVD present. No tracheal deviation present. No thyromegaly present.  Cardiovascular: Normal rate, regular rhythm and normal heart sounds.  Exam reveals no gallop and no friction rub.   No murmur heard. Pulmonary/Chest: Effort normal and breath sounds normal. No respiratory  distress. She has no wheezes. She has no rales. She exhibits no tenderness.  Abdominal: Soft. Bowel sounds are normal. She exhibits no distension and no mass. There is no tenderness. There is no rebound and no guarding.  Musculoskeletal: Normal range of motion. She exhibits no tenderness.  Lymphadenopathy:    She has no cervical adenopathy.  Neurological: She is alert and oriented to person, place, and time. She displays normal reflexes. No cranial nerve deficit. She exhibits normal muscle tone. Coordination normal.  Skin: No rash noted. She is not diaphoretic.  Vitals reviewed.         Assessment & Plan:  Other fatigue - Plan: Hemoglobin, fingerstick, CBC with Differential/Platelet, COMPLETE METABOLIC PANEL WITH GFR, Sedimentation rate, TSH, B. burgdorfi antibodies by WB, Urinalysis, Routine w reflex microscopic (not at [facility]), CANCELED: Urinalysis Dipstick  Blood in stool - Plan: Hemoglobin, fingerstick, Urinalysis, Routine w reflex microscopic (not at [facility])   Patient symptoms sound consistent with heat exhaustion versus a viral syndrome. I have recommended rest. Also recommended the patient eat more salt-containing foods and increase her fluid consumption to help raise her blood pressure for symptomatic relief. At the present time I will check a CBC, CMP,ESR TSH,  And Lyme titer. I suspect the patient may have had some blood in her stool secondary to an internal hemorrhoid as there are no external hemorrhoids. I would like her to monitor this. If the bleeding persists, she will need to have a colonoscopy. Her last colonoscopy was clear 3 years ago. If the bleeding becomes very heavy I want her to go to the hospital. If the bleeding stops spontaneously, I do not feel the patient requires a colonoscopy.   I do not believe the bleeding is related to her fatigue given her normal hemoglobin. We will monitor the patient clinically for the next 24-48 hours.

## 2014-11-19 LAB — SEDIMENTATION RATE: Sed Rate: 1 mm/hr (ref 0–30)

## 2014-11-20 LAB — B. BURGDORFI ANTIBODIES BY WB
B burgdorferi IgG Abs (IB): NEGATIVE
B burgdorferi IgM Abs (IB): NEGATIVE

## 2014-12-30 ENCOUNTER — Ambulatory Visit (INDEPENDENT_AMBULATORY_CARE_PROVIDER_SITE_OTHER): Payer: PPO | Admitting: *Deleted

## 2014-12-30 DIAGNOSIS — Z23 Encounter for immunization: Secondary | ICD-10-CM | POA: Diagnosis not present

## 2015-01-02 ENCOUNTER — Ambulatory Visit: Payer: PPO

## 2015-05-05 DIAGNOSIS — M1711 Unilateral primary osteoarthritis, right knee: Secondary | ICD-10-CM | POA: Diagnosis not present

## 2015-06-02 ENCOUNTER — Other Ambulatory Visit: Payer: Self-pay

## 2015-06-02 DIAGNOSIS — Z1231 Encounter for screening mammogram for malignant neoplasm of breast: Secondary | ICD-10-CM

## 2015-06-08 ENCOUNTER — Other Ambulatory Visit: Payer: Self-pay | Admitting: Family Medicine

## 2015-06-08 DIAGNOSIS — Z780 Asymptomatic menopausal state: Secondary | ICD-10-CM

## 2015-06-08 DIAGNOSIS — E2839 Other primary ovarian failure: Secondary | ICD-10-CM

## 2015-07-07 ENCOUNTER — Ambulatory Visit
Admission: RE | Admit: 2015-07-07 | Discharge: 2015-07-07 | Disposition: A | Discharge status: Home / Self Care | Payer: PPO | Source: Ambulatory Visit | Attending: Family Medicine | Admitting: Family Medicine

## 2015-07-07 ENCOUNTER — Ambulatory Visit
Admission: RE | Admit: 2015-07-07 | Discharge: 2015-07-07 | Disposition: A | Discharge status: Home / Self Care | Payer: PPO | Source: Ambulatory Visit

## 2015-07-07 DIAGNOSIS — H25013 Cortical age-related cataract, bilateral: Secondary | ICD-10-CM | POA: Diagnosis not present

## 2015-07-07 DIAGNOSIS — Z13820 Encounter for screening for osteoporosis: Secondary | ICD-10-CM | POA: Diagnosis not present

## 2015-07-07 DIAGNOSIS — Z780 Asymptomatic menopausal state: Secondary | ICD-10-CM | POA: Diagnosis not present

## 2015-07-07 DIAGNOSIS — Z1231 Encounter for screening mammogram for malignant neoplasm of breast: Secondary | ICD-10-CM

## 2015-07-07 DIAGNOSIS — E2839 Other primary ovarian failure: Secondary | ICD-10-CM

## 2015-07-07 DIAGNOSIS — Z0100 Encounter for examination of eyes and vision without abnormal findings: Secondary | ICD-10-CM | POA: Diagnosis not present

## 2015-07-09 ENCOUNTER — Encounter: Payer: Self-pay | Admitting: *Deleted

## 2015-07-23 ENCOUNTER — Encounter: Payer: Self-pay | Admitting: Physician Assistant

## 2015-07-23 ENCOUNTER — Ambulatory Visit (INDEPENDENT_AMBULATORY_CARE_PROVIDER_SITE_OTHER): Payer: PPO | Admitting: Physician Assistant

## 2015-07-23 VITALS — BP 104/60 | HR 68 | Temp 98.1°F | Resp 18 | Wt 123.0 lb

## 2015-07-23 DIAGNOSIS — J988 Other specified respiratory disorders: Secondary | ICD-10-CM

## 2015-07-23 DIAGNOSIS — B9689 Other specified bacterial agents as the cause of diseases classified elsewhere: Secondary | ICD-10-CM

## 2015-07-23 DIAGNOSIS — T148XXA Other injury of unspecified body region, initial encounter: Secondary | ICD-10-CM

## 2015-07-23 DIAGNOSIS — T148 Other injury of unspecified body region: Secondary | ICD-10-CM | POA: Diagnosis not present

## 2015-07-23 MED ORDER — AZITHROMYCIN 250 MG PO TABS: ORAL_TABLET | ORAL | Status: DC

## 2015-07-23 MED ORDER — CYCLOBENZAPRINE HCL 10 MG PO TABS: 10.0000 mg | ORAL_TABLET | Freq: Three times a day (TID) | ORAL | Status: DC | PRN

## 2015-07-23 NOTE — Progress Notes (Signed)
Patient ID: Taylor Taylor MRN: [id number], DOB: 09-14-1945, 70 y.o. Date of Encounter: 07/23/2015, 4:44 PM    Chief Complaint:  Chief Complaint  Patient presents with  . sick x 4 days    cough/congestion/chest hurts, pain near rt shoulder blade     HPI: 70 y.o. year old white female presents with above.   Leaving in few days for trip to [place]. "Wanted to make sure nothing serious before leave." Cough, chest congestion for 4 days. Chest feels a little sore when coughs. Very little head/nose congestion. No sore throat. No fever,chills.  Right shoulder blade sore recently. Notices it especially when cooking in kitchen, cleaning pans in kitchen sink.   No other complaint/concern.      Home Meds:   Outpatient Prescriptions Prior to Visit  Medication Sig Dispense Refill  . calcium carbonate (OS-CAL) 600 MG TABS tablet Take 600 mg by mouth 2 (two) times daily with a meal.    . diclofenac (VOLTAREN) 75 MG EC tablet Take 1 tablet (75 mg total) by mouth 2 (two) times daily. 180 tablet 2  . estradiol (ESTRACE) 1 MG tablet Take 1 tablet (1 mg total) by mouth daily. 90 tablet 3  . ferrous sulfate 325 (65 FE) MG tablet Take 325 mg by mouth daily with breakfast.    . Fish Oil-Cholecalciferol (FISH OIL + D3) 1200-1000 MG-UNIT CAPS Take 1 tablet by mouth daily.    . Glucosamine-Chondroit-Vit C-Mn (GLUCOSAMINE CHONDR 1500 COMPLX) CAPS Take 1 capsule by mouth 2 (two) times daily.    . Multiple Vitamins-Minerals (WOMENS MULTI VITAMIN & MINERAL PO) Take 1 tablet by mouth daily.    . progesterone (PROMETRIUM) 100 MG capsule Take 1 capsule (100 mg total) by mouth daily. 90 capsule 3  . fluticasone (FLONASE) 50 MCG/ACT nasal spray Place 2 sprays into both nostrils daily. (Patient not taking: Reported on 04/15/2014) 48 g 4   No facility-administered medications prior to visit.    Allergies: No Known Allergies    Review of Systems: See HPI for pertinent ROS. All other ROS negative.    Physical  Exam: Blood pressure 104/60, pulse 68, temperature 98.1 F (36.7 C), temperature source Oral, resp. rate 18, weight 123 lb (55.792 kg)., Body mass index is 21.1 kg/(m^2). General:  WNWD WF. Appears in no acute distress. HEENT: Normocephalic, atraumatic, eyes without discharge, sclera non-icteric, nares are without discharge. Bilateral auditory canals clear, TM's are without perforation, pearly grey and translucent with reflective cone of light bilaterally. Oral cavity moist, posterior pharynx without exudate, erythema, peritonsillar abscess.  Neck: Supple. No thyromegaly. No lymphadenopathy. Lungs: Clear bilaterally to auscultation without wheezes, rales, or rhonchi. Breathing is unlabored. Heart: Regular rhythm. No murmurs, rubs, or gallops. Msk:  Strength and tone normal for age.Positive tenderness with palpation of peri-scapular area/medial border of scapula. This reproduces pain she has been experiencing.  Extremities/Skin: Warm and dry. Neuro: Alert and oriented X 3. Moves all extremities spontaneously. Gait is normal. CNII-XII grossly in tact. Psych:  Responds to questions appropriately with a normal affect.     ASSESSMENT AND PLAN:  70 y.o. year old female with  1. Bacterial respiratory infection She is to fill medication and take it with her. If cough/congestion worsen or she develops fever--or, if symptoms persist >7 days, then start antibiotic, take as directed, complete all of it.  - azithromycin (ZITHROMAX) 250 MG tablet; Day 1: Take 2 daily. Days 2-5: Take 1 daily.  Dispense: 6 tablet; Refill: 0  2. Muscle  strain Discussed being aware of posture/body position. Stretch throughout day, ROM of neck and shoulders throughout day. Apply heat. Cont Voltaren.  Cautioned Flexeril may cause drowsiness. Take first dose at home. If causes drowsiness, only take at night. f causes no drowsiness, take every 8 hours as needed.  - cyclobenzaprine (FLEXERIL) 10 MG tablet; Take 1 tablet (10 mg  total) by mouth 3 (three) times daily as needed for muscle spasms.  Dispense: 30 tablet; Refill: 0   Signed, [address] [place], [place], [facility] 07/23/2015 4:44 PM

## 2015-08-06 ENCOUNTER — Encounter: Payer: Self-pay | Admitting: *Deleted

## 2015-08-06 ENCOUNTER — Other Ambulatory Visit: Payer: Self-pay | Admitting: *Deleted

## 2015-08-06 MED ORDER — PROGESTERONE MICRONIZED 100 MG PO CAPS: 100.0000 mg | ORAL_CAPSULE | Freq: Every day | ORAL | Status: DC

## 2015-08-06 MED ORDER — ESTRADIOL 1 MG PO TABS
1.0000 mg | ORAL_TABLET | Freq: Every day | ORAL | Status: DC
Start: 2015-08-06 — End: 2016-01-21

## 2015-08-06 MED ORDER — DICLOFENAC SODIUM 75 MG PO TBEC: 75.0000 mg | DELAYED_RELEASE_TABLET | Freq: Two times a day (BID) | ORAL | Status: DC

## 2015-08-06 NOTE — Telephone Encounter (Signed)
This encounter was created in error - please disregard.

## 2015-08-06 NOTE — Telephone Encounter (Signed)
Patient reports that she had MRI done today. States that insurance did not pay any on the MRI.   Please call her to figuret this out   [phone].

## 2015-08-28 DIAGNOSIS — M25511 Pain in right shoulder: Secondary | ICD-10-CM | POA: Diagnosis not present

## 2015-10-16 DIAGNOSIS — M1711 Unilateral primary osteoarthritis, right knee: Secondary | ICD-10-CM | POA: Diagnosis not present

## 2015-11-12 ENCOUNTER — Other Ambulatory Visit: Payer: Self-pay | Admitting: Family Medicine

## 2015-11-12 ENCOUNTER — Other Ambulatory Visit: Payer: PPO

## 2015-11-12 ENCOUNTER — Telehealth: Payer: Self-pay | Admitting: Family Medicine

## 2015-11-12 DIAGNOSIS — Z79899 Other long term (current) drug therapy: Secondary | ICD-10-CM | POA: Diagnosis not present

## 2015-11-12 DIAGNOSIS — Z1159 Encounter for screening for other viral diseases: Secondary | ICD-10-CM | POA: Diagnosis not present

## 2015-11-12 DIAGNOSIS — Z79890 Hormone replacement therapy (postmenopausal): Secondary | ICD-10-CM

## 2015-11-12 DIAGNOSIS — E781 Pure hyperglyceridemia: Secondary | ICD-10-CM

## 2015-11-12 LAB — CBC WITH DIFFERENTIAL/PLATELET
Basophils Absolute: 0 cells/uL (ref 0–200)
Basophils Relative: 0 %
Eosinophils Absolute: 140 cells/uL (ref 15–500)
Eosinophils Relative: 4 %
HCT: 39.2 % (ref 35.0–45.0)
Hemoglobin: 12.7 g/dL (ref 12.0–15.0)
Lymphocytes Relative: 43 %
Lymphs Abs: 1505 cells/uL (ref 850–3900)
MCH: 30.5 pg (ref 27.0–33.0)
MCHC: 32.4 g/dL (ref 32.0–36.0)
MCV: 94 fL (ref 80.0–100.0)
MPV: 9.3 fL (ref 7.5–12.5)
Monocytes Absolute: 315 cells/uL (ref 200–950)
Monocytes Relative: 9 %
Neutro Abs: 1540 cells/uL (ref 1500–7800)
Neutrophils Relative %: 44 %
Platelets: 296 10*3/uL (ref 140–400)
RBC: 4.17 MIL/uL (ref 3.80–5.10)
RDW: 13.6 % (ref 11.0–15.0)
WBC: 3.5 10*3/uL — ABNORMAL LOW (ref 3.8–10.8)

## 2015-11-12 LAB — LIPID PANEL
Cholesterol: 223 mg/dL — ABNORMAL HIGH (ref 125–200)
HDL: 100 mg/dL (ref >=46)
LDL Cholesterol: 102 mg/dL (ref <130)
Total CHOL/HDL Ratio: 2.2 Ratio (ref <=5.0)
Triglycerides: 105 mg/dL (ref <150)
VLDL: 21 mg/dL (ref <30)

## 2015-11-12 LAB — TSH: TSH: 1.85 mIU/L

## 2015-11-12 NOTE — Telephone Encounter (Signed)
Taylor Taylor called saying she has a follow-up appointment next week and is wondering if on that day, if she can also have a Hepatitis C injection. She read information that due to her age, she may need one. Please call her if needed.  Pt's ph# [phone] Thank you.

## 2015-11-12 NOTE — Telephone Encounter (Signed)
No Hep c injection - only bw which was drew this am - pt aware via vm

## 2015-11-13 LAB — HEPATITIS C ANTIBODY: HCV Ab: NEGATIVE

## 2015-11-13 LAB — VITAMIN D 25 HYDROXY (VIT D DEFICIENCY, FRACTURES): Vit D, 25-Hydroxy: 63 ng/mL (ref 30–100)

## 2015-11-16 ENCOUNTER — Other Ambulatory Visit: Payer: Self-pay | Admitting: Obstetrics and Gynecology

## 2015-11-16 ENCOUNTER — Telehealth: Payer: Self-pay | Admitting: Family Medicine

## 2015-11-16 DIAGNOSIS — M8580 Other specified disorders of bone density and structure, unspecified site: Secondary | ICD-10-CM

## 2015-11-16 DIAGNOSIS — Z01419 Encounter for gynecological examination (general) (routine) without abnormal findings: Secondary | ICD-10-CM | POA: Diagnosis not present

## 2015-11-16 DIAGNOSIS — Z124 Encounter for screening for malignant neoplasm of cervix: Secondary | ICD-10-CM | POA: Diagnosis not present

## 2015-11-16 HISTORY — DX: Other specified disorders of bone density and structure, unspecified site: M85.80

## 2015-11-16 LAB — COMPLETE METABOLIC PANEL WITH GFR
ALT: 11 U/L (ref 6–29)
AST: 15 U/L (ref 10–35)
Albumin: 4.2 g/dL (ref 3.6–5.1)
Alkaline Phosphatase: 29 U/L — ABNORMAL LOW (ref 33–130)
BUN: 20 mg/dL (ref 7–25)
CO2: 28 mmol/L (ref 20–31)
Calcium: 9.6 mg/dL (ref 8.6–10.4)
Chloride: 104 mmol/L (ref 98–110)
Creat: 0.92 mg/dL (ref 0.50–0.99)
GFR, Est African American: 73 mL/min (ref >=60)
GFR, Est Non African American: 64 mL/min (ref >=60)
Glucose, Bld: 90 mg/dL (ref 70–99)
Potassium: 4.7 mmol/L (ref 3.5–5.3)
Sodium: 140 mmol/L (ref 135–146)
Total Bilirubin: 0.5 mg/dL (ref 0.2–1.2)
Total Protein: 6.9 g/dL (ref 6.1–8.1)

## 2015-11-16 LAB — HEPATITIS C ANTIBODY: HCV Ab: NEGATIVE

## 2015-11-17 ENCOUNTER — Ambulatory Visit (INDEPENDENT_AMBULATORY_CARE_PROVIDER_SITE_OTHER): Payer: PPO | Admitting: Family Medicine

## 2015-11-17 ENCOUNTER — Encounter: Payer: Self-pay | Admitting: Family Medicine

## 2015-11-17 VITALS — BP 122/64 | HR 72 | Temp 98.1°F | Resp 14 | Ht 64.0 in | Wt 123.0 lb

## 2015-11-17 DIAGNOSIS — Z13220 Encounter for screening for lipoid disorders: Secondary | ICD-10-CM | POA: Diagnosis not present

## 2015-11-17 DIAGNOSIS — L821 Other seborrheic keratosis: Secondary | ICD-10-CM | POA: Diagnosis not present

## 2015-11-17 DIAGNOSIS — Z0000 Encounter for general adult medical examination without abnormal findings: Secondary | ICD-10-CM | POA: Diagnosis not present

## 2015-11-17 DIAGNOSIS — L814 Other melanin hyperpigmentation: Secondary | ICD-10-CM | POA: Diagnosis not present

## 2015-11-17 DIAGNOSIS — D1801 Hemangioma of skin and subcutaneous tissue: Secondary | ICD-10-CM | POA: Diagnosis not present

## 2015-11-17 DIAGNOSIS — D225 Melanocytic nevi of trunk: Secondary | ICD-10-CM | POA: Diagnosis not present

## 2015-11-17 DIAGNOSIS — L819 Disorder of pigmentation, unspecified: Secondary | ICD-10-CM | POA: Diagnosis not present

## 2015-11-17 LAB — CYTOLOGY - PAP

## 2015-11-17 MED ORDER — DICLOFENAC SODIUM 75 MG PO TBEC: 75.0000 mg | DELAYED_RELEASE_TABLET | Freq: Two times a day (BID) | ORAL | 2 refills | Status: DC

## 2015-11-17 NOTE — Progress Notes (Signed)
Subjective:    Patient ID: Taylor Taylor, female    DOB: [birth year], 70 y.o.   MRN: [id number]  HPI Patient is doing well.  Here to review her recent lab work and for a refill on diclofenac which she takes for low back pain.   Orders Only on 11/12/2015  Component Date Value Ref Range Status  . Sodium 11/16/2015 140  135 - 146 mmol/L Final  . Potassium 11/16/2015 4.7  3.5 - 5.3 mmol/L Final  . Chloride 11/16/2015 104  98 - 110 mmol/L Final  . CO2 11/16/2015 28  20 - 31 mmol/L Final  . Glucose, Bld 11/16/2015 90  70 - 99 mg/dL Final  . BUN 11/16/2015 20  7 - 25 mg/dL Final  . Creat 11/16/2015 0.92  0.50 - 0.99 mg/dL Final   Comment:   For patients > or = 69 years of age: The upper reference limit for Creatinine is approximately 13% higher for people identified as African-American.     . Total Bilirubin 11/16/2015 0.5  0.2 - 1.2 mg/dL Final  . Alkaline Phosphatase 11/16/2015 29* 33 - 130 U/L Final  . AST 11/16/2015 15  10 - 35 U/L Final  . ALT 11/16/2015 11  6 - 29 U/L Final  . Total Protein 11/16/2015 6.9  6.1 - 8.1 g/dL Final  . Albumin 11/16/2015 4.2  3.6 - 5.1 g/dL Final  . Calcium 11/16/2015 9.6  8.6 - 10.4 mg/dL Final  . GFR, Est African American 11/16/2015 73  >=60 mL/min Final  . GFR, Est Non African American 11/16/2015 64  >=60 mL/min Final  . HCV Ab 11/16/2015 NEGATIVE  NEGATIVE Final  Lab on 11/12/2015  Component Date Value Ref Range Status  . TSH 11/12/2015 1.85  mIU/L Final   Comment:   Reference Range   > or = 20 Years  0.40-4.50   Pregnancy Range First trimester  0.26-2.66 Second trimester 0.55-2.73 Third trimester  0.43-2.91     . Cholesterol 11/12/2015 223* 125 - 200 mg/dL Final  . Triglycerides 11/12/2015 105  <150 mg/dL Final  . HDL 11/12/2015 100  >=46 mg/dL Final  . Total CHOL/HDL Ratio 11/12/2015 2.2  <=5.0 Ratio Final  . VLDL 11/12/2015 21  <30 mg/dL Final  . LDL Cholesterol 11/12/2015 102  <130 mg/dL Final   Comment:   Total Cholesterol/HDL  Ratio:CHD Risk                        Coronary Heart Disease Risk Table                                        Men       Women          1/2 Average Risk              3.4        3.3              Average Risk              5.0        4.4           2X Average Risk              9.6        7.1           3X Average Risk  23.4       11.0 Use the calculated Patient Ratio above and the CHD Risk table  to determine the patient's CHD Risk.   . WBC 11/12/2015 3.5* 3.8 - 10.8 K/uL Final  . RBC 11/12/2015 4.17  3.80 - 5.10 MIL/uL Final  . Hemoglobin 11/12/2015 12.7  12.0 - 15.0 g/dL Final  . HCT 11/12/2015 39.2  35.0 - 45.0 % Final  . MCV 11/12/2015 94.0  80.0 - 100.0 fL Final  . MCH 11/12/2015 30.5  27.0 - 33.0 pg Final  . MCHC 11/12/2015 32.4  32.0 - 36.0 g/dL Final  . RDW 11/12/2015 13.6  11.0 - 15.0 % Final  . Platelets 11/12/2015 296  140 - 400 K/uL Final  . MPV 11/12/2015 9.3  7.5 - 12.5 fL Final  . Neutro Abs 11/12/2015 1540  1,500 - 7,800 cells/uL Final  . Lymphs Abs 11/12/2015 1505  850 - 3,900 cells/uL Final  . Monocytes Absolute 11/12/2015 315  200 - 950 cells/uL Final  . Eosinophils Absolute 11/12/2015 140  15 - 500 cells/uL Final  . Basophils Absolute 11/12/2015 0  0 - 200 cells/uL Final  . Neutrophils Relative % 11/12/2015 44  % Final  . Lymphocytes Relative 11/12/2015 43  % Final  . Monocytes Relative 11/12/2015 9  % Final  . Eosinophils Relative 11/12/2015 4  % Final  . Basophils Relative 11/12/2015 0  % Final  . Smear Review 11/12/2015 Criteria for review not met   Final  . Vit D, 25-Hydroxy 11/13/2015 63  30 - 100 ng/mL Final   Comment: Vitamin D Status           25-OH Vitamin D        Deficiency                <20 ng/mL        Insufficiency         20 - 29 ng/mL        Optimal             > or = 30 ng/mL   For 25-OH Vitamin D testing on patients on D2-supplementation and patients for whom quantitation of D2 and D3 fractions is required, the QuestAssureD 25-OH VIT  D, (D2,D3), LC/MS/MS is recommended: order code [id number] (patients > 2 yrs).   Marland Kitchen HCV Ab 11/13/2015 NEGATIVE  NEGATIVE Final    Past Medical History:  Diagnosis Date  . Allergy    Seasonal  . Arthritis   . GERD (gastroesophageal reflux disease)    History reviewed. No pertinent surgical history. Current Outpatient Prescriptions on File Prior to Visit  Medication Sig Dispense Refill  . calcium carbonate (OS-CAL) 600 MG TABS tablet Take 600 mg by mouth 2 (two) times daily with a meal.    . estradiol (ESTRACE) 1 MG tablet Take 1 tablet (1 mg total) by mouth daily. 90 tablet 3  . ferrous sulfate 325 (65 FE) MG tablet Take 325 mg by mouth daily with breakfast.    . Fish Oil-Cholecalciferol (FISH OIL + D3) 1200-1000 MG-UNIT CAPS Take 1 tablet by mouth daily.    . Glucosamine-Chondroit-Vit C-Mn (GLUCOSAMINE CHONDR 1500 COMPLX) CAPS Take 1 capsule by mouth 2 (two) times daily.    . Multiple Vitamins-Minerals (WOMENS MULTI VITAMIN & MINERAL PO) Take 1 tablet by mouth daily.    . progesterone (PROMETRIUM) 100 MG capsule Take 1 capsule (100 mg total) by mouth daily. 90 capsule 3  . cyclobenzaprine (FLEXERIL) 10 MG tablet Take  1 tablet (10 mg total) by mouth 3 (three) times daily as needed for muscle spasms. (Patient not taking: Reported on 11/17/2015) 30 tablet 0  . fluticasone (FLONASE) 50 MCG/ACT nasal spray Place 2 sprays into both nostrils daily. (Patient not taking: Reported on 11/17/2015) 48 g 4   No current facility-administered medications on file prior to visit.    No Known Allergies Social History   Social History  . Marital status: Married    Spouse name: N/A  . Number of children: N/A  . Years of education: N/A   Occupational History  . Not on file.   Social History Main Topics  . Smoking status: Never Smoker  . Smokeless tobacco: Never Used  . Alcohol use No  . Drug use: No  . Sexual activity: Not on file   Other Topics Concern  . Not on file   Social History Narrative    . No narrative on file      Review of Systems  All other systems reviewed and are negative.      Objective:   Physical Exam  Constitutional: She is oriented to person, place, and time. She appears well-developed and well-nourished. No distress.  HENT:  Head: Normocephalic and atraumatic.  Right Ear: External ear normal.  Left Ear: External ear normal.  Nose: Nose normal.  Mouth/Throat: Oropharynx is clear and moist. No oropharyngeal exudate.  Eyes: Conjunctivae and EOM are normal. Pupils are equal, round, and reactive to light.  Neck: Neck supple. No JVD present. No tracheal deviation present. No thyromegaly present.  Cardiovascular: Normal rate, regular rhythm and normal heart sounds.  Exam reveals no gallop and no friction rub.   No murmur heard. Pulmonary/Chest: Effort normal and breath sounds normal. No respiratory distress. She has no wheezes. She has no rales. She exhibits no tenderness.  Abdominal: Soft. Bowel sounds are normal. She exhibits no distension and no mass. There is no tenderness. There is no rebound and no guarding.  Musculoskeletal: Normal range of motion. She exhibits no tenderness.  Lymphadenopathy:    She has no cervical adenopathy.  Neurological: She is alert and oriented to person, place, and time. She displays normal reflexes. No cranial nerve deficit. She exhibits normal muscle tone. Coordination normal.  Skin: No rash noted. She is not diaphoretic.  Vitals reviewed.         Assessment & Plan:  Screening cholesterol level  Routine general medical examination at a health care facility  Patient's labs are excellent.  Immunizations are up to date.  Cancer screening is up to date.  Continue to encourage her to wean off HRT.

## 2015-12-29 ENCOUNTER — Ambulatory Visit: Payer: PPO | Admitting: Family Medicine

## 2016-01-21 ENCOUNTER — Encounter (INDEPENDENT_AMBULATORY_CARE_PROVIDER_SITE_OTHER): Payer: Self-pay | Admitting: Orthopedic Surgery

## 2016-01-21 ENCOUNTER — Ambulatory Visit (INDEPENDENT_AMBULATORY_CARE_PROVIDER_SITE_OTHER): Payer: PPO | Admitting: Orthopedic Surgery

## 2016-01-21 VITALS — BP 123/75 | HR 73 | Resp 14 | Ht 63.0 in | Wt 128.0 lb

## 2016-01-21 DIAGNOSIS — M1711 Unilateral primary osteoarthritis, right knee: Secondary | ICD-10-CM

## 2016-01-21 MED ORDER — BUPIVACAINE HCL 0.5 % IJ SOLN
3.0000 mL | INTRAMUSCULAR | Status: AC | PRN
  Administered 2016-01-21: 3 mL via INTRA_ARTICULAR

## 2016-01-21 MED ORDER — METHYLPREDNISOLONE ACETATE 40 MG/ML IJ SUSP
80.0000 mg | INTRAMUSCULAR | Status: AC | PRN
  Administered 2016-01-21: 80 mg

## 2016-01-21 MED ORDER — LIDOCAINE HCL 1 % IJ SOLN
5.0000 mL | INTRAMUSCULAR | Status: AC | PRN
  Administered 2016-01-21: 5 mL

## 2016-01-21 NOTE — Progress Notes (Signed)
Office Visit Note   [name]           Date of Birth: 1945-07-21           MRN: [id number] Visit Date: 01/21/2016              Requested by: [name], MD [address] [facility] [place], [place] [address] PCP: Odette Fraction, MD   Assessment & Plan: Visit Diagnoses: No diagnosis found.  Plan: Corticosteroid injection was given to the right knee atraumatically.  Follow-up when necessary    Follow-Up Instructions: No Follow-up on file.   Orders:  No orders of the defined types were placed in this encounter.  No orders of the defined types were placed in this encounter.     Procedures: Large Joint Inj Date/Time: 01/21/2016 3:57 PM Performed by: Biagio Borg D Authorized by: Biagio Borg D   Consent Given by:  Patient Timeout: prior to procedure the correct patient, procedure, and site was verified   Indications:  Pain and joint swelling Location:  Knee Site:  R knee Prep: patient was prepped and draped in usual sterile fashion   Needle Size:  25 G Needle Length:  1.5 inches Approach:  Anteromedial Ultrasound Guidance: No   Fluoroscopic Guidance: No   Arthrogram: No Medications:  5 mL lidocaine 1 %; 80 mg methylPREDNISolone acetate 40 MG/ML; 3 mL bupivacaine 0.5 % Aspiration Attempted: No   Patient tolerance:  Patient tolerated the procedure well with no immediate complications     Clinical Data: No additional findings.   Subjective: Chief Complaint  Patient presents with  . Right Knee - Pain    Pt has had cortisone injection on 10/16/2015 and had good relief until approximately 1-2 weeks ago when she started hurting again. She unfortunately has  been unable to obtain approval for Visco supplementation secondary to her insurance. Therefore her only alternative is corticosteroid injections. She comes in today requesting of the procedure. She denies any recent history of injury or trauma. Denies any giving way at this  time.    Review of Systems  Constitutional: Negative.   HENT: Negative.   Eyes: Negative.   Respiratory: Negative.   Cardiovascular: Negative.   Gastrointestinal: Negative.   Endocrine: Negative.   Genitourinary: Negative.   Musculoskeletal: Negative.   Allergic/Immunologic: Negative.   Neurological: Negative.   Hematological: Negative.   Psychiatric/Behavioral: Negative.      Objective: Vital Signs: BP 123/75 (BP Location: Right Arm)   Pulse 73   Resp 14   Ht 5\' 3"  (1.6 m)   Wt 128 lb (58.1 kg)   BMI 22.67 kg/m   Physical Exam  Constitutional: She is oriented to person, place, and time. She appears well-developed and well-nourished.  HENT:  Head: Normocephalic and atraumatic.  Eyes: EOM are normal. Pupils are equal, round, and reactive to light.  Neck:  No carotid bruits  Cardiovascular: Normal rate.   Pulmonary/Chest: Effort normal.  Neurological: She is alert and oriented to person, place, and time.  Skin: Skin is warm and dry.  Psychiatric: She has a normal mood and affect. Her behavior is normal. Judgment and thought content normal.    Right Knee Exam   Tenderness  The patient is experiencing tenderness in the medial joint line.  Range of Motion  Extension: normal  Right knee flexion: 105.   Muscle Strength   The patient has normal right knee strength.  Other  Sensation: normal Pulse: present Swelling: none  Specialty Comments:  No specialty comments available.  Imaging: No results found.   PMFS History: Patient Active Problem List   Diagnosis Date Noted  . Hypertriglyceridemia without hypercholesterolemia 09/03/2013  . CHEST PAIN-PRECORDIAL 08/31/2009  . CHEST PAIN, ATYPICAL 08/28/2009   Past Medical History:  Diagnosis Date  . Allergy    Seasonal  . Arthritis   . GERD (gastroesophageal reflux disease)     Family History  Problem Relation Age of Onset  . Parkinson's disease Mother   . Brain cancer Father     No past  surgical history on file. Social History   Occupational History  . Not on file.   Social History Main Topics  . Smoking status: Never Smoker  . Smokeless tobacco: Never Used  . Alcohol use No  . Drug use: No  . Sexual activity: Not on file

## 2016-02-29 ENCOUNTER — Other Ambulatory Visit (INDEPENDENT_AMBULATORY_CARE_PROVIDER_SITE_OTHER): Payer: Self-pay

## 2016-02-29 ENCOUNTER — Telehealth (INDEPENDENT_AMBULATORY_CARE_PROVIDER_SITE_OTHER): Payer: Self-pay | Admitting: Orthopaedic Surgery

## 2016-02-29 DIAGNOSIS — M25561 Pain in right knee: Principal | ICD-10-CM

## 2016-02-29 DIAGNOSIS — G8929 Other chronic pain: Secondary | ICD-10-CM

## 2016-02-29 NOTE — Telephone Encounter (Signed)
Ok to schedule.

## 2016-02-29 NOTE — Telephone Encounter (Signed)
Patient is requesting an MRI be ordered of her right knee. Please advise.

## 2016-02-29 NOTE — Telephone Encounter (Signed)
OK to schedule

## 2016-02-29 NOTE — Telephone Encounter (Signed)
Done

## 2016-03-04 ENCOUNTER — Ambulatory Visit
Admission: RE | Admit: 2016-03-04 | Discharge: 2016-03-04 | Disposition: A | Discharge status: Home / Self Care | Payer: PPO | Source: Ambulatory Visit | Attending: Orthopaedic Surgery | Admitting: Orthopaedic Surgery

## 2016-03-04 DIAGNOSIS — G8929 Other chronic pain: Secondary | ICD-10-CM

## 2016-03-04 DIAGNOSIS — M25561 Pain in right knee: Secondary | ICD-10-CM | POA: Diagnosis not present

## 2016-03-08 ENCOUNTER — Ambulatory Visit (INDEPENDENT_AMBULATORY_CARE_PROVIDER_SITE_OTHER): Payer: PPO | Admitting: Orthopaedic Surgery

## 2016-03-08 ENCOUNTER — Encounter (INDEPENDENT_AMBULATORY_CARE_PROVIDER_SITE_OTHER): Payer: Self-pay | Admitting: Orthopaedic Surgery

## 2016-03-08 VITALS — Ht 63.0 in | Wt 128.0 lb

## 2016-03-08 DIAGNOSIS — M545 Low back pain: Secondary | ICD-10-CM | POA: Diagnosis not present

## 2016-03-08 DIAGNOSIS — M25561 Pain in right knee: Secondary | ICD-10-CM

## 2016-03-08 DIAGNOSIS — G8929 Other chronic pain: Secondary | ICD-10-CM | POA: Diagnosis not present

## 2016-03-08 NOTE — Progress Notes (Signed)
   Office Visit Note   [name]           Date of Birth: 1946-02-15           MRN: [id number] Visit Date: 03/08/2016              Requested by: Taylor Frizzle, MD [address] [facility] [place] [address] PCP: Taylor Fraction, MD   Assessment & Plan: Visit Diagnoses: Bilateral lower extremity pain with some localization to both knees right greater than left. Osteoarthritis present and particularly in the medial compartment of the right knee. I think there may be some component of pain referred from her lumbar spine. And I think from a diagnostic and therapeutic standpoint it would be worthwhile to order an epidural steroid injection.  Plan: Dr. Ernestina Taylor to assess for an epidural steroid injection lumbar spine   Follow-Up Instructions: No Follow-up on file.   Orders:  No orders of the defined types were placed in this encounter.  No orders of the defined types were placed in this encounter.     Procedures: No procedures performed   Clinical Data: No additional findings. MRI scan of the right knee was reviewed with Taylor Taylor. There is some diminution in the posterior horn of the medial meniscus probably consistent with her prior arthroscopic procedure in 2010. There is progressive chondral thinning in the medial compartment.   Subjective: No chief complaint on file.   Pt is here today to go over MRI results Right knee.  Taylor Taylor has been experiencing pain in both thighs and calves and knees. She is here for reevaluation of her knees after an MRI scan was performed of the right knee. She also has had chronic problems with her lumbar spine. She's had a recent MRI scan in 2015 consistent with degenerative changes and some areas of mild spinal stenosis.  Review of Systems   Objective: Vital Signs: There were no vitals taken for this visit.  Physical Exam  Ortho Exam right knee was not effused full extension and flexion compared to the right knee. Mild medial  joint pain without instability . Straight leg raise was negative bilaterally. No swelling distally neurovascular exam was intact. No pain with range of motion of either hip.  No specialty comments available.  Imaging: No results found.   PMFS History: Patient Active Problem List   Diagnosis Date Noted  . Hypertriglyceridemia without hypercholesterolemia 09/03/2013  . CHEST PAIN-PRECORDIAL 08/31/2009  . CHEST PAIN, ATYPICAL 08/28/2009   Past Medical History:  Diagnosis Date  . Allergy    Seasonal  . Arthritis   . GERD (gastroesophageal reflux disease)     Family History  Problem Relation Age of Onset  . Parkinson's disease Mother   . Brain cancer Father     No past surgical history on file. Social History   Occupational History  . Not on file.   Social History Main Topics  . Smoking status: Never Smoker  . Smokeless tobacco: Never Used  . Alcohol use No  . Drug use: No  . Sexual activity: Not on file

## 2016-04-05 ENCOUNTER — Ambulatory Visit (INDEPENDENT_AMBULATORY_CARE_PROVIDER_SITE_OTHER): Payer: PPO | Admitting: Physical Medicine and Rehabilitation

## 2016-04-05 ENCOUNTER — Encounter (INDEPENDENT_AMBULATORY_CARE_PROVIDER_SITE_OTHER): Payer: Self-pay | Admitting: Physical Medicine and Rehabilitation

## 2016-04-05 VITALS — BP 121/68 | HR 66 | Temp 98.4°F

## 2016-04-05 DIAGNOSIS — M25562 Pain in left knee: Secondary | ICD-10-CM

## 2016-04-05 DIAGNOSIS — M5416 Radiculopathy, lumbar region: Secondary | ICD-10-CM | POA: Diagnosis not present

## 2016-04-05 DIAGNOSIS — M25561 Pain in right knee: Secondary | ICD-10-CM

## 2016-04-05 DIAGNOSIS — G8929 Other chronic pain: Secondary | ICD-10-CM

## 2016-04-05 MED ORDER — LIDOCAINE HCL (PF) 1 % IJ SOLN
0.3300 mL | Freq: Once | INTRAMUSCULAR | Status: AC
  Administered 2016-04-05: 0.3 mL

## 2016-04-05 MED ORDER — METHYLPREDNISOLONE ACETATE 80 MG/ML IJ SUSP
80.0000 mg | Freq: Once | INTRAMUSCULAR | Status: AC
  Administered 2016-04-05: 80 mg

## 2016-04-05 NOTE — Progress Notes (Signed)
Taylor Taylor - 71 y.o. female MRN [id number]  Date of birth: [birth year]  Office Visit Note: Visit Date: 04/05/2016 PCP: Odette Fraction, MD Referred by: [name], MD  Subjective: Chief Complaint  Patient presents with  . Lower Back - Pain   HPI: Taylor Taylor is a 71 year old female that I saw about a year and a half ago for spinal injection for low back and hip pain. She now reports pain is coming from right knee and occasionally left knee pain. Constant pain. Throbbing pain. Has had steroid injections in right knee with no relief. No actual lower back pain. Notices some thigh pain in both legs when laying down. He does get some pain referring from the hips to thighs. She has degenerative disc changes pretty severe to L5-S1 with significant disc height loss foraminal narrowing.    ROS Otherwise per HPI.  Assessment & Plan: Visit Diagnoses:  1. Lumbar radiculopathy   2. Chronic pain of left knee   3. Chronic pain of right knee     Plan: Findings:  Diagnostic right L5 transforaminal epidural steroid injection. She'll follow-up with Dr. Tito Dine for her knee pain to see if this is helped at all.    Meds & Orders:  Meds ordered this encounter  Medications  . lidocaine (PF) (XYLOCAINE) 1 % injection 0.3 mL  . methylPREDNISolone acetate (DEPO-MEDROL) injection 80 mg    Orders Placed This Encounter  Procedures  . Epidural Steroid injection    Follow-up: Return for Follow-up with Dr. Durward Fortes in a couple weeks.   Procedures: No procedures performed  Lumbosacral Transforaminal Epidural Steroid Injection - Infraneural Approach with Fluoroscopic Guidance  [name]      Date of Birth: 1945-06-24 MRN: [id number] PCP: Odette Fraction, MD      Visit Date: 04/05/2016   Universal Protocol:    Date/Time: 01/16/181:01 PM  Consent Given By: the patient  Position: PRONE   Additional Comments: Vital signs were monitored before and after the  procedure. Patient was prepped and draped in the usual sterile fashion. The correct patient, procedure, and site was verified.   Injection Procedure Details:  Procedure Site One Meds Administered:  Meds ordered this encounter  Medications  . lidocaine (PF) (XYLOCAINE) 1 % injection 0.3 mL  . methylPREDNISolone acetate (DEPO-MEDROL) injection 80 mg      Laterality: Right  Location/Site:  L5-S1  Needle size: 22 G  Needle type: Spinal  Needle Placement: Transforaminal  Findings:  -Contrast Used: 1 mL iohexol 180 mg iodine/mL   -Comments: Excellent flow of contrast along the nerve and into the epidural space.  Procedure Details: After squaring off the end-plates of the desired vertebral level to get a true AP view, the C-arm was obliqued to the painful side so that the superior articulating process is positioned about 1/3 the length of the inferior endplate.  The needle was aimed toward the junction of the superior articular process and the transverse process of the inferior vertebrae. The needle's initial entry is in the lower third of the foramen through Kambin's triangle. The soft tissues overlying this target were infiltrated with 2-3 ml. of 1% Lidocaine without Epinephrine.  The spinal needle was then inserted and advanced toward the target using a "trajectory" view along the fluoroscope beam.  Under AP and lateral visualization, the needle was advanced so it did not puncture dura and did not traverse medially beyond the 6 o'clock position of the pedicle. Bi-planar projections were used to confirm  position. Aspiration was confirmed to be negative for CSF and/or blood. A 1-2 ml. volume of Isovue-250 was injected and flow of contrast was noted at each level. Radiographs were obtained for documentation purposes.   After attaining the desired flow of contrast documented above, a 0.5 to 1.0 ml test dose of 0.25% Marcaine was injected into each respective transforaminal space.  The  patient was observed for 90 seconds post injection.  After no sensory deficits were reported, and normal lower extremity motor function was noted,   the above injectate was administered so that equal amounts of the injectate were placed at each foramen (level) into the transforaminal epidural space.   Additional Comments:  The patient tolerated the procedure well Dressing: Band-Aid    Post-procedure details: Patient was observed during the procedure. Post-procedure instructions were reviewed.  Patient left the clinic in stable condition.     Clinical History: No specialty comments available.  She reports that she has never smoked. She has never used smokeless tobacco. No results for input(s): HGBA1C, LABURIC in the last 8760 hours.  Objective:  VS:  HT:    WT:   BMI:     BP:121/68  HR:66bpm  TEMP:98.4 F (36.9 C)(Oral)  RESP:98 % Physical Exam  Musculoskeletal:  The patient ambulates without aid with a forward flexed spine with good distal strength.    Ortho Exam Imaging: No results found.  Past Medical/Family/Surgical/Social History: Medications & Allergies reviewed per EMR Patient Active Problem List   Diagnosis Date Noted  . Hypertriglyceridemia without hypercholesterolemia 09/03/2013  . CHEST PAIN-PRECORDIAL 08/31/2009  . CHEST PAIN, ATYPICAL 08/28/2009   Past Medical History:  Diagnosis Date  . Allergy    Seasonal  . Arthritis   . GERD (gastroesophageal reflux disease)    Family History  Problem Relation Age of Onset  . Parkinson's disease Mother   . Brain cancer Father    History reviewed. No pertinent surgical history. Social History   Occupational History  . Not on file.   Social History Main Topics  . Smoking status: Never Smoker  . Smokeless tobacco: Never Used  . Alcohol use No  . Drug use: No  . Sexual activity: Not on file

## 2016-04-05 NOTE — Patient Instructions (Signed)

## 2016-04-05 NOTE — Procedures (Signed)
Lumbosacral Transforaminal Epidural Steroid Injection - Infraneural Approach with Fluoroscopic Guidance  [name]      Date of Birth: [birth year] MRN: [id number] PCP: Odette Fraction, MD      Visit Date: 04/05/2016   Universal Protocol:    Date/Time: 01/16/181:01 PM  Consent Given By: the patient  Position: PRONE   Additional Comments: Vital signs were monitored before and after the procedure. Patient was prepped and draped in the usual sterile fashion. The correct patient, procedure, and site was verified.   Injection Procedure Details:  Procedure Site One Meds Administered:  Meds ordered this encounter  Medications  . lidocaine (PF) (XYLOCAINE) 1 % injection 0.3 mL  . methylPREDNISolone acetate (DEPO-MEDROL) injection 80 mg      Laterality: Right  Location/Site:  L5-S1  Needle size: 22 G  Needle type: Spinal  Needle Placement: Transforaminal  Findings:  -Contrast Used: 1 mL iohexol 180 mg iodine/mL   -Comments: Excellent flow of contrast along the nerve and into the epidural space.  Procedure Details: After squaring off the end-plates of the desired vertebral level to get a true AP view, the C-arm was obliqued to the painful side so that the superior articulating process is positioned about 1/3 the length of the inferior endplate.  The needle was aimed toward the junction of the superior articular process and the transverse process of the inferior vertebrae. The needle's initial entry is in the lower third of the foramen through Kambin's triangle. The soft tissues overlying this target were infiltrated with 2-3 ml. of 1% Lidocaine without Epinephrine.  The spinal needle was then inserted and advanced toward the target using a "trajectory" view along the fluoroscope beam.  Under AP and lateral visualization, the needle was advanced so it did not puncture dura and did not traverse medially beyond the 6 o'clock position of the pedicle. Bi-planar  projections were used to confirm position. Aspiration was confirmed to be negative for CSF and/or blood. A 1-2 ml. volume of Isovue-250 was injected and flow of contrast was noted at each level. Radiographs were obtained for documentation purposes.   After attaining the desired flow of contrast documented above, a 0.5 to 1.0 ml test dose of 0.25% Marcaine was injected into each respective transforaminal space.  The patient was observed for 90 seconds post injection.  After no sensory deficits were reported, and normal lower extremity motor function was noted,   the above injectate was administered so that equal amounts of the injectate were placed at each foramen (level) into the transforaminal epidural space.   Additional Comments:  The patient tolerated the procedure well Dressing: Band-Aid    Post-procedure details: Patient was observed during the procedure. Post-procedure instructions were reviewed.  Patient left the clinic in stable condition.

## 2016-04-07 ENCOUNTER — Ambulatory Visit: Payer: PPO | Admitting: Family Medicine

## 2016-04-08 ENCOUNTER — Ambulatory Visit (INDEPENDENT_AMBULATORY_CARE_PROVIDER_SITE_OTHER): Payer: PPO | Admitting: Orthopaedic Surgery

## 2016-04-12 ENCOUNTER — Encounter: Payer: PPO | Admitting: Family Medicine

## 2016-04-12 NOTE — Progress Notes (Signed)
This encounter was created in error - please disregard.

## 2016-04-14 ENCOUNTER — Other Ambulatory Visit: Payer: Self-pay | Admitting: Family Medicine

## 2016-04-14 ENCOUNTER — Encounter: Payer: Self-pay | Admitting: Internal Medicine

## 2016-04-14 DIAGNOSIS — Z1211 Encounter for screening for malignant neoplasm of colon: Secondary | ICD-10-CM

## 2016-05-24 ENCOUNTER — Ambulatory Visit (AMBULATORY_SURGERY_CENTER): Payer: Self-pay

## 2016-05-24 VITALS — Ht 62.5 in | Wt 134.0 lb

## 2016-05-24 DIAGNOSIS — Z1211 Encounter for screening for malignant neoplasm of colon: Secondary | ICD-10-CM

## 2016-05-24 MED ORDER — NA SULFATE-K SULFATE-MG SULF 17.5-3.13-1.6 GM/177ML PO SOLN: ORAL | 0 refills | Status: DC

## 2016-05-24 NOTE — Progress Notes (Signed)
Per pt, no allergies to soy or egg products.Pt not taking any weight loss meds or using  O2 at home. 

## 2016-05-25 ENCOUNTER — Other Ambulatory Visit: Payer: Self-pay | Admitting: Family Medicine

## 2016-05-25 ENCOUNTER — Encounter: Payer: Self-pay | Admitting: Internal Medicine

## 2016-05-25 DIAGNOSIS — Z1231 Encounter for screening mammogram for malignant neoplasm of breast: Secondary | ICD-10-CM

## 2016-06-01 ENCOUNTER — Telehealth (INDEPENDENT_AMBULATORY_CARE_PROVIDER_SITE_OTHER): Payer: Self-pay | Admitting: Orthopaedic Surgery

## 2016-06-01 ENCOUNTER — Telehealth (INDEPENDENT_AMBULATORY_CARE_PROVIDER_SITE_OTHER): Payer: Self-pay | Admitting: Physical Medicine and Rehabilitation

## 2016-06-01 NOTE — Telephone Encounter (Signed)
Patient called wanting to see if her insurance will approve for her to get the Euflexxa injections.  [phone] or her cell [phone].  Thank you.

## 2016-06-02 NOTE — Telephone Encounter (Signed)
Ok to do

## 2016-06-02 NOTE — Telephone Encounter (Signed)
Can I send for pre cert?

## 2016-06-02 NOTE — Telephone Encounter (Signed)
I called and left a message for patient to call back to discuss.

## 2016-06-02 NOTE — Telephone Encounter (Signed)
Sent to prior auth

## 2016-06-02 NOTE — Telephone Encounter (Signed)
If did not help more than 50% should either follow up with Dr. Sydnee Taylor ( her knee pain was issue at time ) or new lspine MRI.

## 2016-06-02 NOTE — Telephone Encounter (Signed)
Patient called back and left message. I called her back and left another message and will try another number later.

## 2016-06-03 NOTE — Telephone Encounter (Signed)
If right at 50% to go ahead and repeat. Then will follow up with Dr. Durward Fortes

## 2016-06-03 NOTE — Telephone Encounter (Signed)
[employer] auth for [phone].

## 2016-06-06 NOTE — Telephone Encounter (Signed)
Faxed auth form with last 2 office notes to [facility]

## 2016-06-07 ENCOUNTER — Ambulatory Visit (AMBULATORY_SURGERY_CENTER): Payer: PPO | Admitting: Internal Medicine

## 2016-06-07 ENCOUNTER — Encounter: Payer: Self-pay | Admitting: Internal Medicine

## 2016-06-07 VITALS — BP 106/56 | HR 77 | Temp 98.6°F | Resp 11 | Ht 62.5 in | Wt 134.0 lb

## 2016-06-07 DIAGNOSIS — Z1212 Encounter for screening for malignant neoplasm of rectum: Secondary | ICD-10-CM | POA: Diagnosis not present

## 2016-06-07 DIAGNOSIS — Z1211 Encounter for screening for malignant neoplasm of colon: Secondary | ICD-10-CM | POA: Diagnosis not present

## 2016-06-07 MED ORDER — SODIUM CHLORIDE 0.9 % IV SOLN: 500.0000 mL | INTRAVENOUS | Status: DC

## 2016-06-07 NOTE — Op Note (Signed)
[place] Patient Name: Taylor Taylor Procedure Date: 06/07/2016 8:13 AM MRN: [id number] Endoscopist: Jerene Bears , MD Age: 71 Referring MD:  Date of Birth: 03/19/1946 Gender: Female Account #: [id number] Procedure:                Colonoscopy Indications:              Screening for colorectal malignant neoplasm, Last                            colonoscopy 10 years ago Medicines:                Monitored Anesthesia Care Procedure:                Pre-Anesthesia Assessment:                           - Prior to the procedure, a History and Physical                            was performed, and patient medications and                            allergies were reviewed. The patient's tolerance of                            previous anesthesia was also reviewed. The risks                            and benefits of the procedure and the sedation                            options and risks were discussed with the patient.                            All questions were answered, and informed consent                            was obtained. Prior Anticoagulants: The patient has                            taken no previous anticoagulant or antiplatelet                            agents. ASA Grade Assessment: II - A patient with                            mild systemic disease. After reviewing the risks                            and benefits, the patient was deemed in                            satisfactory condition to undergo the procedure.  After obtaining informed consent, the colonoscope                            was passed under direct vision. Throughout the                            procedure, the patient's blood pressure, pulse, and                            oxygen saturations were monitored continuously. The                            Colonoscope was introduced through the anus and                            advanced to the the cecum, identified by                             appendiceal orifice and ileocecal valve. The                            colonoscopy was performed without difficulty. The                            patient tolerated the procedure well. The quality                            of the bowel preparation was excellent. The                            ileocecal valve, appendiceal orifice, and rectum                            were photographed. Scope In: 8:19:39 AM Scope Out: 8:37:58 AM Scope Withdrawal Time: 0 hours 11 minutes 16 seconds  Total Procedure Duration: 0 hours 18 minutes 19 seconds  Findings:                 The digital rectal exam was normal.                           Multiple small and large-mouthed diverticula were                            found in the sigmoid colon.                           Internal hemorrhoids were found during                            retroflexion. The hemorrhoids were small.                           The exam was otherwise without abnormality with  exception of mild diffuse melanosis coli. Complications:            No immediate complications. Estimated Blood Loss:     Estimated blood loss: none. Impression:               - Mild diverticulosis in the sigmoid colon.                           - Internal hemorrhoids.                           - The examination was otherwise normal.                           - No specimens collected. Recommendation:           - Patient has a contact number available for                            emergencies. The signs and symptoms of potential                            delayed complications were discussed with the                            patient. Return to normal activities tomorrow.                            Written discharge instructions were provided to the                            patient.                           - Resume previous diet.                           - Continue present medications.                            - No repeat screening colonoscopy due to age. Jerene Bears, MD 06/07/2016 8:43:08 AM This report has been signed electronically.

## 2016-06-07 NOTE — Progress Notes (Signed)
Spontaneous respirations throughout. VSS. Resting comfortably. To PACU on room air. Report to  Sara RN. 

## 2016-06-07 NOTE — Patient Instructions (Signed)
YOU HAD AN ENDOSCOPIC PROCEDURE TODAY AT [facility]:   Refer to the procedure report that was given to you for any specific questions about what was found during the examination.  If the procedure report does not answer your questions, please call your gastroenterologist to clarify.  If you requested that your care partner not be given the details of your procedure findings, then the procedure report has been included in a sealed envelope for you to review at your convenience later.  YOU SHOULD EXPECT: Some feelings of bloating in the abdomen. Passage of more gas than usual.  Walking can help get rid of the air that was put into your GI tract during the procedure and reduce the bloating. If you had a lower endoscopy (such as a colonoscopy or flexible sigmoidoscopy) you may notice spotting of blood in your stool or on the toilet paper. If you underwent a bowel prep for your procedure, you may not have a normal bowel movement for a few days.  Please Note:  You might notice some irritation and congestion in your nose or some drainage.  This is from the oxygen used during your procedure.  There is no need for concern and it should clear up in a day or so.  SYMPTOMS TO REPORT IMMEDIATELY:   Following lower endoscopy (colonoscopy or flexible sigmoidoscopy):  Excessive amounts of blood in the stool  Significant tenderness or worsening of abdominal pains  Swelling of the abdomen that is new, acute  Fever of 100F or higher  For urgent or emergent issues, a gastroenterologist can be reached at any hour by calling [phone].   DIET:  We do recommend a small meal at first, but then you may proceed to your regular diet.  Drink plenty of fluids but you should avoid alcoholic beverages for 24 hours.  MEDICATIONS:  Continue present medications.  ACTIVITY:  You should plan to take it easy for the rest of today and you should NOT DRIVE or use heavy machinery until tomorrow (because of the  sedation medicines used during the test).    FOLLOW UP: Our staff will call the number listed on your records the next business day following your procedure to check on you and address any questions or concerns that you may have regarding the information given to you following your procedure. If we do not reach you, we will leave a message.  However, if you are feeling well and you are not experiencing any problems, there is no need to return our call.  We will assume that you have returned to your regular daily activities without incident.  If any biopsies were taken you will be contacted by phone or by letter within the next 1-3 weeks.  Please call us at [phone] if you have not heard about the biopsies in 3 weeks.   Please handouts given to you by your recover nurse.  Thank you for allowing [place] to provide for your healthcare needs today.   SIGNATURES/CONFIDENTIALITY: You and/or your care partner have signed paperwork which will be entered into your electronic medical record.  These signatures attest to the fact that that the information above on your After Visit Summary has been reviewed and is understood.  Full responsibility of the confidentiality of this discharge information lies with you and/or your care-partner.

## 2016-06-08 ENCOUNTER — Telehealth: Payer: Self-pay

## 2016-06-08 NOTE — Telephone Encounter (Signed)
  Follow up Call-  Call back number 06/07/2016  Post procedure Call Back phone  # [phone]  Permission to leave phone message Yes  Some recent data might be hidden     Left message

## 2016-06-08 NOTE — Telephone Encounter (Signed)
  Follow up Call-  Call back number 06/07/2016  Post procedure Call Back phone  # [phone]  Permission to leave phone message Yes  Some recent data might be hidden     Patient questions:  Do you have a fever, pain , or abdominal swelling? No. Pain Score  0 *  Have you tolerated food without any problems? Yes.    Have you been able to return to your normal activities? Yes.    Do you have any questions about your discharge instructions: Diet   No. Medications  No. Follow up visit  No.  Do you have questions or concerns about your Care? No.  Actions: * If pain score is 4 or above: No action needed, pain <4.  No problems noted per pt. maw

## 2016-06-10 NOTE — Telephone Encounter (Signed)
Still pending per website 

## 2016-06-13 NOTE — Telephone Encounter (Signed)
Still pending as of this morning .Tried to call pt to go ahead and schedule and had to leave message.

## 2016-06-14 NOTE — Telephone Encounter (Signed)
Patient called back and left message. I called her and left another message to call to schedule.

## 2016-06-14 NOTE — Telephone Encounter (Signed)
Still pending Taylor Taylor ahead and scheduled her for out 1st available afternoon appt at this time which is for 06/28/16 @ 1:15 w/driver

## 2016-06-16 NOTE — Telephone Encounter (Signed)
Still pending per website 

## 2016-06-21 ENCOUNTER — Telehealth (INDEPENDENT_AMBULATORY_CARE_PROVIDER_SITE_OTHER): Payer: Self-pay | Admitting: Orthopaedic Surgery

## 2016-06-21 NOTE — Telephone Encounter (Signed)
Knee injections

## 2016-06-21 NOTE — Telephone Encounter (Signed)
Approved per website. VTXL#[id number] eff 06/06/16-09/04/16.

## 2016-06-21 NOTE — Telephone Encounter (Signed)
Patient left a message inquiring about her injections.  She is wanting to know if we have heard anything from her insurance company [facility]).  CB#[phone].  Thank you.

## 2016-06-28 ENCOUNTER — Ambulatory Visit (INDEPENDENT_AMBULATORY_CARE_PROVIDER_SITE_OTHER): Payer: PPO | Admitting: Physical Medicine and Rehabilitation

## 2016-06-28 ENCOUNTER — Encounter (INDEPENDENT_AMBULATORY_CARE_PROVIDER_SITE_OTHER): Payer: Self-pay | Admitting: Physical Medicine and Rehabilitation

## 2016-06-28 ENCOUNTER — Ambulatory Visit (INDEPENDENT_AMBULATORY_CARE_PROVIDER_SITE_OTHER): Payer: PPO

## 2016-06-28 VITALS — BP 126/65 | HR 98

## 2016-06-28 DIAGNOSIS — M5416 Radiculopathy, lumbar region: Secondary | ICD-10-CM | POA: Diagnosis not present

## 2016-06-28 MED ORDER — METHYLPREDNISOLONE ACETATE 80 MG/ML IJ SUSP
80.0000 mg | Freq: Once | INTRAMUSCULAR | Status: AC
  Administered 2016-06-28: 80 mg

## 2016-06-28 MED ORDER — LIDOCAINE HCL (PF) 1 % IJ SOLN
0.3300 mL | Freq: Once | INTRAMUSCULAR | Status: AC
  Administered 2016-06-28: 0.3 mL

## 2016-06-28 NOTE — Patient Instructions (Signed)

## 2016-06-28 NOTE — Progress Notes (Signed)
Taylor Taylor - 71 y.o. female MRN [id number]  Date of birth: 03/25/1945  Office Visit Note: Visit Date: 06/28/2016 PCP: Odette Fraction, MD Referred by: [name], MD  Subjective: Chief Complaint  Patient presents with  . Lower Back - Pain   HPI: [name] is a 71 year old female with chronic long-term history of low back and right hip and leg pain. We last saw her in January and completed right L5 transforaminal epidural steroid injection with great relief up until just recently. She now complains of worsening pain across lower back and right knee pain. Back pain comes and goes. Can get relief with applying heat. Right knee pain is constant with weight bearing. Denies numbness and tingling. Her knee pain has been the biggest issue over time but the epidural injection seems to help her knee pain quite a bit. She has degenerative disc changes quite severe at L5-S1 with foraminal narrowing. We'll repeat the transforaminal injection today.    ROS Otherwise per HPI.  Assessment & Plan: Visit Diagnoses:  1. Lumbar radiculopathy     Plan: Findings:  Right L5 transforaminal epidural steroid injection.    Meds & Orders:  Meds ordered this encounter  Medications  . lidocaine (PF) (XYLOCAINE) 1 % injection 0.3 mL  . methylPREDNISolone acetate (DEPO-MEDROL) injection 80 mg    Orders Placed This Encounter  Procedures  . XR C-ARM NO REPORT  . Epidural Steroid injection    Follow-up: Return if symptoms worsen or fail to improve.   Procedures: No procedures performed  Lumbosacral Transforaminal Epidural Steroid Injection - Infraneural Approach with Fluoroscopic Guidance  [name]      Date of Birth: 1946/02/26 MRN: [id number] PCP: Odette Fraction, MD      Visit Date: 06/28/2016   Universal Protocol:    Date/Time: 04/12/185:12 AM  Consent Given By: the patient  Position: PRONE   Additional Comments: Vital signs were monitored before and after the  procedure. Patient was prepped and draped in the usual sterile fashion. The correct patient, procedure, and site was verified.   Injection Procedure Details:  Procedure Site One Meds Administered:  Meds ordered this encounter  Medications  . lidocaine (PF) (XYLOCAINE) 1 % injection 0.3 mL  . methylPREDNISolone acetate (DEPO-MEDROL) injection 80 mg      Laterality: Right  Location/Site:  L5-S1  Needle size: 22 G  Needle type: Spinal  Needle Placement: Transforaminal  Findings:  -Contrast Used: 1 mL iohexol 180 mg iodine/mL   -Comments: Excellent flow of contrast along the nerve and into the epidural space.  Procedure Details: After squaring off the end-plates of the desired vertebral level to get a true AP view, the C-arm was obliqued to the painful side so that the superior articulating process is positioned about 1/3 the length of the inferior endplate.  The needle was aimed toward the junction of the superior articular process and the transverse process of the inferior vertebrae. The needle's initial entry is in the lower third of the foramen through Kambin's triangle. The soft tissues overlying this target were infiltrated with 2-3 ml. of 1% Lidocaine without Epinephrine.  The spinal needle was then inserted and advanced toward the target using a "trajectory" view along the fluoroscope beam.  Under AP and lateral visualization, the needle was advanced so it did not puncture dura and did not traverse medially beyond the 6 o'clock position of the pedicle. Bi-planar projections were used to confirm position. Aspiration was confirmed to be negative for  CSF and/or blood. A 1-2 ml. volume of Isovue-250 was injected and flow of contrast was noted at each level. Radiographs were obtained for documentation purposes.   After attaining the desired flow of contrast documented above, a 0.5 to 1.0 ml test dose of 0.25% Marcaine was injected into each respective transforaminal space.  The  patient was observed for 90 seconds post injection.  After no sensory deficits were reported, and normal lower extremity motor function was noted,   the above injectate was administered so that equal amounts of the injectate were placed at each foramen (level) into the transforaminal epidural space.   Additional Comments:  The patient tolerated the procedure well Dressing: Band-Aid    Post-procedure details: Patient was observed during the procedure. Post-procedure instructions were reviewed.  Patient left the clinic in stable condition.   Clinical History: No specialty comments available.  She reports that she has never smoked. She has never used smokeless tobacco. No results for input(s): HGBA1C, LABURIC in the last 8760 hours.  Objective:  VS:  HT:    WT:   BMI:     BP:126/65  HR:98bpm  TEMP: ( )  RESP:100 % Physical Exam  Musculoskeletal:  Patient ambulates without aid with good distal strength. Brief examination of the knee shows no effusion or joint line tenderness.    Ortho Exam Imaging: No results found.  Past Medical/Family/Surgical/Social History: Medications & Allergies reviewed per EMR Patient Active Problem List   Diagnosis Date Noted  . Hypertriglyceridemia without hypercholesterolemia 09/03/2013  . CHEST PAIN-PRECORDIAL 08/31/2009  . CHEST PAIN, ATYPICAL 08/28/2009   Past Medical History:  Diagnosis Date  . Allergy    Seasonal  . Arthritis    back  . GERD (gastroesophageal reflux disease)    in past   Family History  Problem Relation Age of Onset  . Parkinson's disease Mother   . Brain cancer Father    Past Surgical History:  Procedure Laterality Date  . CERVICAL DISC SURGERY     over 10 years  . COLONOSCOPY     Social History   Occupational History  . Not on file.   Social History Main Topics  . Smoking status: Never Smoker  . Smokeless tobacco: Never Used  . Alcohol use No  . Drug use: No  . Sexual activity: Not on file

## 2016-06-30 NOTE — Procedures (Signed)
Lumbosacral Transforaminal Epidural Steroid Injection - Infraneural Approach with Fluoroscopic Guidance  [name]      Date of Birth: [birth year] MRN: [id number] PCP: Odette Fraction, MD      Visit Date: 06/28/2016   Universal Protocol:    Date/Time: 04/12/185:12 AM  Consent Given By: the patient  Position: PRONE   Additional Comments: Vital signs were monitored before and after the procedure. Patient was prepped and draped in the usual sterile fashion. The correct patient, procedure, and site was verified.   Injection Procedure Details:  Procedure Site One Meds Administered:  Meds ordered this encounter  Medications  . lidocaine (PF) (XYLOCAINE) 1 % injection 0.3 mL  . methylPREDNISolone acetate (DEPO-MEDROL) injection 80 mg      Laterality: Right  Location/Site:  L5-S1  Needle size: 22 G  Needle type: Spinal  Needle Placement: Transforaminal  Findings:  -Contrast Used: 1 mL iohexol 180 mg iodine/mL   -Comments: Excellent flow of contrast along the nerve and into the epidural space.  Procedure Details: After squaring off the end-plates of the desired vertebral level to get a true AP view, the C-arm was obliqued to the painful side so that the superior articulating process is positioned about 1/3 the length of the inferior endplate.  The needle was aimed toward the junction of the superior articular process and the transverse process of the inferior vertebrae. The needle's initial entry is in the lower third of the foramen through Kambin's triangle. The soft tissues overlying this target were infiltrated with 2-3 ml. of 1% Lidocaine without Epinephrine.  The spinal needle was then inserted and advanced toward the target using a "trajectory" view along the fluoroscope beam.  Under AP and lateral visualization, the needle was advanced so it did not puncture dura and did not traverse medially beyond the 6 o'clock position of the pedicle. Bi-planar  projections were used to confirm position. Aspiration was confirmed to be negative for CSF and/or blood. A 1-2 ml. volume of Isovue-250 was injected and flow of contrast was noted at each level. Radiographs were obtained for documentation purposes.   After attaining the desired flow of contrast documented above, a 0.5 to 1.0 ml test dose of 0.25% Marcaine was injected into each respective transforaminal space.  The patient was observed for 90 seconds post injection.  After no sensory deficits were reported, and normal lower extremity motor function was noted,   the above injectate was administered so that equal amounts of the injectate were placed at each foramen (level) into the transforaminal epidural space.   Additional Comments:  The patient tolerated the procedure well Dressing: Band-Aid    Post-procedure details: Patient was observed during the procedure. Post-procedure instructions were reviewed.  Patient left the clinic in stable condition.

## 2016-07-01 NOTE — Telephone Encounter (Signed)
Please call patient and schedule Euflexxa x 3 for right knee with Aaron Edelman on Wednesday's, buy and bill. Thank you.

## 2016-07-07 ENCOUNTER — Telehealth (INDEPENDENT_AMBULATORY_CARE_PROVIDER_SITE_OTHER): Payer: Self-pay | Admitting: Orthopaedic Surgery

## 2016-07-07 ENCOUNTER — Ambulatory Visit
Admission: RE | Admit: 2016-07-07 | Discharge: 2016-07-07 | Disposition: A | Discharge status: Home / Self Care | Payer: PPO | Source: Ambulatory Visit | Attending: Family Medicine | Admitting: Family Medicine

## 2016-07-07 DIAGNOSIS — Z1231 Encounter for screening mammogram for malignant neoplasm of breast: Secondary | ICD-10-CM

## 2016-07-07 NOTE — Telephone Encounter (Signed)
Please advise 

## 2016-07-07 NOTE — Telephone Encounter (Signed)
Patient called inquiring about the status of her injections.  CB#[phone].  Thank you.

## 2016-07-08 NOTE — Telephone Encounter (Signed)
Left message on machine for patient to call back to schedule Euflexxa injections for right knee.

## 2016-07-27 NOTE — Telephone Encounter (Signed)
New

## 2016-08-10 ENCOUNTER — Ambulatory Visit (INDEPENDENT_AMBULATORY_CARE_PROVIDER_SITE_OTHER): Payer: PPO | Admitting: Orthopedic Surgery

## 2016-08-10 DIAGNOSIS — M1711 Unilateral primary osteoarthritis, right knee: Secondary | ICD-10-CM

## 2016-08-10 MED ORDER — SODIUM HYALURONATE (VISCOSUP) 20 MG/2ML IX SOSY
20.0000 mg | PREFILLED_SYRINGE | INTRA_ARTICULAR | Status: AC | PRN
  Administered 2016-08-10: 20 mg via INTRA_ARTICULAR

## 2016-08-10 MED ORDER — LIDOCAINE HCL 1 % IJ SOLN
3.0000 mL | INTRAMUSCULAR | Status: AC | PRN
  Administered 2016-08-10: 3 mL

## 2016-08-10 NOTE — Progress Notes (Signed)
Office Visit Note   [name]           Date of Birth: 22-Nov-1945           MRN: [id number] Visit Date: 08/10/2016              Requested by: [name], MD [address] [place], [place] [address] PCP: [name], MD   Assessment & Plan: Visit Diagnoses:  1. Unilateral primary osteoarthritis, right knee     Plan:  #1: First Euflex injection of the right knee was given without difficulty  Follow-Up Instructions: Return in about 1 week (around 08/17/2016).   Orders:  No orders of the defined types were placed in this encounter.  No orders of the defined types were placed in this encounter.     Procedures: Large Joint Inj Date/Time: 08/10/2016 11:43 AM Performed by: Biagio Borg D Authorized by: Biagio Borg D   Consent Given by:  Patient Timeout: prior to procedure the correct patient, procedure, and site was verified   Indications:  Pain and joint swelling Location:  Knee Site:  R knee Prep: patient was prepped and draped in usual sterile fashion   Needle Size:  25 G Needle Length:  1.5 inches Approach:  Anteromedial Ultrasound Guidance: No   Fluoroscopic Guidance: No   Arthrogram: No   Medications:  3 mL lidocaine 1 %; 20 mg Sodium Hyaluronate 20 MG/2ML Aspiration Attempted: No   Patient tolerance:  Patient tolerated the procedure well with no immediate complications     Clinical Data: No additional findings.   Subjective: Chief Complaint  Patient presents with  . Right Knee - Injections    Taylor Taylor is a very pleasant 72 year old white female who is seen today for evaluation of her right knee. She did have a cortisone injection on 10/16/2015 as well as 01/21/2016.  She was denied Visco supplementation last year however when we reapplied fourth this year she was actually given approval for Visco to the right knee. She returns today for her first Euflex injection.      Review of Systems  Constitutional:  Negative.   HENT: Negative.   Eyes: Negative.   Respiratory: Negative.   Cardiovascular: Negative.   Gastrointestinal: Negative.   Endocrine: Negative.   Genitourinary: Negative.   Musculoskeletal: Negative.   Allergic/Immunologic: Negative.   Neurological: Negative.   Hematological: Negative.   Psychiatric/Behavioral: Negative.   All other systems reviewed and are negative.    Objective: Vital Signs: There were no vitals taken for this visit.  Physical Exam  Constitutional: She is oriented to person, place, and time. She appears well-developed and well-nourished.  HENT:  Head: Normocephalic and atraumatic.  Eyes: EOM are normal. Pupils are equal, round, and reactive to light.  Pulmonary/Chest: Effort normal.  Neurological: She is alert and oriented to person, place, and time.  Skin: Skin is warm and dry.  Psychiatric: She has a normal mood and affect. Her behavior is normal. Judgment and thought content normal.    Ortho Exam  Range of motion is from the near full extension to about 105. Trace effusion. Some tenderness about the joint lines.  Specialty Comments:  No specialty comments available.  Imaging: No results found.   PMFS History: Patient Active Problem List   Diagnosis Date Noted  . Hypertriglyceridemia without hypercholesterolemia 09/03/2013  . CHEST PAIN-PRECORDIAL 08/31/2009  . CHEST PAIN, ATYPICAL 08/28/2009   Past Medical History:  Diagnosis Date  . Allergy  Seasonal  . Arthritis    back  . GERD (gastroesophageal reflux disease)    in past    Family History  Problem Relation Age of Onset  . Parkinson's disease Mother   . Brain cancer Father     Past Surgical History:  Procedure Laterality Date  . CERVICAL DISC SURGERY     over 10 years  . COLONOSCOPY     Social History   Occupational History  . Not on file.   Social History Main Topics  . Smoking status: Never Smoker  . Smokeless tobacco: Never Used  . Alcohol use No  .  Drug use: No  . Sexual activity: Not on file

## 2016-08-16 ENCOUNTER — Encounter: Payer: Self-pay | Admitting: Family Medicine

## 2016-08-16 ENCOUNTER — Ambulatory Visit (INDEPENDENT_AMBULATORY_CARE_PROVIDER_SITE_OTHER): Payer: PPO | Admitting: Family Medicine

## 2016-08-16 ENCOUNTER — Ambulatory Visit: Payer: PPO

## 2016-08-16 VITALS — BP 132/68 | HR 64 | Temp 98.0°F | Resp 16 | Ht 64.0 in | Wt 127.0 lb

## 2016-08-16 DIAGNOSIS — H6122 Impacted cerumen, left ear: Secondary | ICD-10-CM

## 2016-08-16 NOTE — Progress Notes (Signed)
Subjective:    Patient ID: Taylor Taylor, female    DOB: Jan 28, 1946, 71 y.o.   MRN: [id number]  HPI She reports the sudden onset of hearing loss on Saturday in her left ear. She denies any pain. She denies any tinnitus. She denies any recent viral infections, upper respiratory infections, or sinus problems Past Medical History:  Diagnosis Date  . Allergy    Seasonal  . Arthritis    back  . GERD (gastroesophageal reflux disease)    in past   Past Surgical History:  Procedure Laterality Date  . CERVICAL DISC SURGERY     over 10 years  . COLONOSCOPY     Current Outpatient Prescriptions on File Prior to Visit  Medication Sig Dispense Refill  . acetaminophen (TYLENOL) 500 MG tablet Take 500 mg by mouth 2 (two) times daily.    . calcium carbonate (OS-CAL) 600 MG TABS tablet Take 600 mg by mouth 2 (two) times daily with a meal.    . COD LIVER OIL PO Take by mouth daily.    . diclofenac (VOLTAREN) 75 MG EC tablet Take 1 tablet (75 mg total) by mouth 2 (two) times daily. 180 tablet 2  . estradiol (ESTRACE) 0.5 MG tablet     . ferrous sulfate 325 (65 FE) MG tablet Take 325 mg by mouth daily with breakfast.    . Fish Oil-Cholecalciferol (FISH OIL + D3) 1200-1000 MG-UNIT CAPS Take 1 tablet by mouth daily.    . fluticasone (FLONASE) 50 MCG/ACT nasal spray Place 2 sprays into both nostrils daily. 48 g 4  . Glucosamine-Chondroit-Vit C-Mn (GLUCOSAMINE CHONDR 1500 COMPLX) CAPS Take 1 capsule by mouth 2 (two) times daily.    . Multiple Vitamins-Minerals (WOMENS MULTI VITAMIN & MINERAL PO) Take 1 tablet by mouth daily.    . progesterone (PROMETRIUM) 100 MG capsule Take 1 capsule (100 mg total) by mouth daily. 90 capsule 3  . Red Yeast Rice 600 MG CAPS Take by mouth daily.    . Turmeric 500 MG TABS Take by mouth daily.     Current Facility-Administered Medications on File Prior to Visit  Medication Dose Route Frequency Provider Last Rate Last Dose  . 0.9 %  sodium chloride infusion  500 mL  Intravenous Continuous Pyrtle, Lajuan Lines, MD       Allergies  Allergen Reactions  . Pollen Extract    Social History   Social History  . Marital status: Married    Spouse name: N/A  . Number of children: N/A  . Years of education: N/A   Occupational History  . Not on file.   Social History Main Topics  . Smoking status: Never Smoker  . Smokeless tobacco: Never Used  . Alcohol use No  . Drug use: No  . Sexual activity: Not on file   Other Topics Concern  . Not on file   Social History Narrative  . No narrative on file      Review of Systems  HENT: Positive for hearing loss. Negative for ear discharge, ear pain and tinnitus.        Objective:   Physical Exam  Constitutional: She appears well-developed and well-nourished.  HENT:  Right Ear: No tenderness. Tympanic membrane is not injected, not scarred, not perforated and not erythematous. No middle ear effusion. No decreased hearing is noted.  Left Ear: No tenderness. Tympanic membrane is not injected, not scarred, not perforated and not erythematous.  No middle ear effusion. Decreased hearing is noted.  Nose: Nose normal.  Mouth/Throat: Oropharynx is clear and moist. No oropharyngeal exudate.  Neck: Neck supple.  Cardiovascular: Normal rate, regular rhythm and normal heart sounds.   Pulmonary/Chest: Effort normal and breath sounds normal. No respiratory distress. She has no wheezes. She has no rales.  Lymphadenopathy:    She has no cervical adenopathy.  Vitals reviewed.   Left external auditory canal is completely occluded with wax. There is an 80% occlusion in the right auditory canal.      Assessment & Plan:  Cerumen impaction, bilateral.  Cerumen was removed with irrigation and lavage without difficulty. Symptoms improved immediately.

## 2016-08-17 ENCOUNTER — Ambulatory Visit (INDEPENDENT_AMBULATORY_CARE_PROVIDER_SITE_OTHER): Payer: PPO | Admitting: Orthopedic Surgery

## 2016-08-17 DIAGNOSIS — M1711 Unilateral primary osteoarthritis, right knee: Secondary | ICD-10-CM

## 2016-08-17 MED ORDER — SODIUM HYALURONATE (VISCOSUP) 20 MG/2ML IX SOSY
20.0000 mg | PREFILLED_SYRINGE | INTRA_ARTICULAR | Status: AC | PRN
  Administered 2016-08-17: 20 mg via INTRA_ARTICULAR

## 2016-08-17 MED ORDER — LIDOCAINE HCL 1 % IJ SOLN
3.0000 mL | INTRAMUSCULAR | Status: AC | PRN
  Administered 2016-08-17: 3 mL

## 2016-08-17 NOTE — Progress Notes (Signed)
   Office Visit Note   [name]           Date of Birth: [birth year]           MRN: [id number] Visit Date: 08/17/2016              Requested by: [name], MD [address] [place], [place] [address] PCP: [name], MD   Assessment & Plan: Visit Diagnoses:  1. Unilateral primary osteoarthritis, right knee     Plan:  #1: Euflex injection #2 to the right knee  Follow-Up Instructions: Return in about 1 week (around 08/24/2016).   Orders:  Orders Placed This Encounter  Procedures  . Large Joint Injection/Arthrocentesis   No orders of the defined types were placed in this encounter.     Procedures: Large Joint Inj Date/Time: 08/17/2016 10:45 AM Performed by: Biagio Borg D Authorized by: Biagio Borg D   Consent Given by:  Patient Timeout: prior to procedure the correct patient, procedure, and site was verified   Indications:  Pain and joint swelling Location:  Knee Site:  R knee Prep: patient was prepped and draped in usual sterile fashion   Needle Size:  25 G Needle Length:  1.5 inches Approach:  Anteromedial Ultrasound Guidance: No   Fluoroscopic Guidance: No   Arthrogram: No   Medications:  3 mL lidocaine 1 %; 20 mg Sodium Hyaluronate 20 MG/2ML Aspiration Attempted: No   Patient tolerance:  Patient tolerated the procedure well with no immediate complications     Clinical Data: No additional findings.   Subjective: Chief Complaint  Patient presents with  . Right Knee - Pain, Follow-up    Taylor Taylor a 71 year old white female who is seen today for her second Euflexa injection. She denies any reactivity.    Review of Systems   Objective: Vital Signs: There were no vitals taken for this visit.  Physical Exam  Right Knee Exam   Comments:  Right knee today is quite benign. No reactivity. No real effusion.      Specialty Comments:  No specialty comments available.  Imaging: No results found.   PMFS  History: Patient Active Problem List   Diagnosis Date Noted  . Hypertriglyceridemia without hypercholesterolemia 09/03/2013  . CHEST PAIN-PRECORDIAL 08/31/2009  . CHEST PAIN, ATYPICAL 08/28/2009   Past Medical History:  Diagnosis Date  . Allergy    Seasonal  . Arthritis    back  . GERD (gastroesophageal reflux disease)    in past    Family History  Problem Relation Age of Onset  . Parkinson's disease Mother   . Brain cancer Father     Past Surgical History:  Procedure Laterality Date  . CERVICAL DISC SURGERY     over 10 years  . COLONOSCOPY     Social History   Occupational History  . Not on file.   Social History Main Topics  . Smoking status: Never Smoker  . Smokeless tobacco: Never Used  . Alcohol use No  . Drug use: No  . Sexual activity: Not on file

## 2016-08-24 ENCOUNTER — Ambulatory Visit (INDEPENDENT_AMBULATORY_CARE_PROVIDER_SITE_OTHER): Payer: PPO | Admitting: Orthopedic Surgery

## 2016-08-24 DIAGNOSIS — M1711 Unilateral primary osteoarthritis, right knee: Secondary | ICD-10-CM | POA: Insufficient documentation

## 2016-08-24 HISTORY — DX: Unilateral primary osteoarthritis, right knee: M17.11

## 2016-08-24 MED ORDER — SODIUM HYALURONATE (VISCOSUP) 20 MG/2ML IX SOSY
20.0000 mg | PREFILLED_SYRINGE | INTRA_ARTICULAR | Status: AC | PRN
  Administered 2016-08-24: 20 mg via INTRA_ARTICULAR

## 2016-08-24 MED ORDER — LIDOCAINE HCL 1 % IJ SOLN
3.0000 mL | INTRAMUSCULAR | Status: AC | PRN
  Administered 2016-08-24: 3 mL

## 2016-08-24 NOTE — Progress Notes (Signed)
Office Visit Note   [name]           Date of Birth: 03-14-1946           MRN: [id number] Visit Date: 08/24/2016              Requested by: [name], MD [address] [place], [place] [address] PCP: [name], MD   Assessment & Plan: Visit Diagnoses:  1. Unilateral primary osteoarthritis, right knee     Plan:  #1: Euflex injection #3 to the right knee without difficulty #2: We did discuss possible arthroscopic surgery if this is not beneficial for her. She will think about this if this is not improved and we'll follow back up.  Follow-Up Instructions: No Follow-up on file.   Orders:  No orders of the defined types were placed in this encounter.  No orders of the defined types were placed in this encounter.     Procedures: Large Joint Inj Date/Time: 08/24/2016 11:09 AM Performed by: Biagio Borg D Authorized by: Biagio Borg D   Consent Given by:  Patient Timeout: prior to procedure the correct patient, procedure, and site was verified   Indications:  Pain and joint swelling Location:  Knee Site:  R knee Prep: patient was prepped and draped in usual sterile fashion   Needle Size:  25 G Needle Length:  1.5 inches Approach:  Anteromedial Ultrasound Guidance: No   Fluoroscopic Guidance: No   Arthrogram: No   Medications:  3 mL lidocaine 1 %; 20 mg Sodium Hyaluronate 20 MG/2ML Aspiration Attempted: No   Patient tolerance:  Patient tolerated the procedure well with no immediate complications     Clinical Data: No additional findings.   Subjective: Chief Complaint  Patient presents with  . Right Knee - Pain  . Knee Pain    Right knee pain, Euflexxa # 3, buy and bill, patient states pain is worse    Taylor Taylor is  a 71 year old white female who is seen today for her third Euflexa injection. She denies any reactivity. So for now much benefit. She had an MRI scan recently which revealed posterior horn and body of the  medial meniscus markedly diminutive compared to a previous one. Most suggestive prior meniscectomy although the patient reports no history of surgery in the past 10 years. This could also represent severe degenerative maceration particularly in the body of the meniscus. There was also marked mucoid degeneration of the anterior cruciate ligament which had progressed from prior exam.    Review of Systems  Constitutional: Negative.   HENT: Negative.   Eyes: Negative.   Respiratory: Negative.   Cardiovascular: Negative.   Gastrointestinal: Negative.   Endocrine: Negative.   Genitourinary: Negative.   Musculoskeletal: Negative.   Allergic/Immunologic: Negative.   Neurological: Negative.   Hematological: Negative.   Psychiatric/Behavioral: Negative.      Objective: Vital Signs: There were no vitals taken for this visit.  Physical Exam  Constitutional: She is oriented to person, place, and time. She appears well-developed and well-nourished.  HENT:  Head: Normocephalic and atraumatic.  Eyes: EOM are normal. Pupils are equal, round, and reactive to light.  Pulmonary/Chest: Effort normal.  Neurological: She is alert and oriented to person, place, and time.  Skin: Skin is warm and dry.  Psychiatric: She has a normal mood and affect. Her behavior is normal. Judgment and thought content normal.    Ortho Exam  Right knee is benign. No real effusion.  No erythema or warmth.  Specialty Comments:  No specialty comments available.  Imaging: No results found.   PMFS History: Patient Active Problem List   Diagnosis Date Noted  . Unilateral primary osteoarthritis, right knee 08/24/2016  . Hypertriglyceridemia without hypercholesterolemia 09/03/2013  . CHEST PAIN-PRECORDIAL 08/31/2009  . CHEST PAIN, ATYPICAL 08/28/2009   Past Medical History:  Diagnosis Date  . Allergy    Seasonal  . Arthritis    back  . GERD (gastroesophageal reflux disease)    in past    Family History    Problem Relation Age of Onset  . Parkinson's disease Mother   . Brain cancer Father     Past Surgical History:  Procedure Laterality Date  . CERVICAL DISC SURGERY     over 10 years  . COLONOSCOPY     Social History   Occupational History  . Not on file.   Social History Main Topics  . Smoking status: Never Smoker  . Smokeless tobacco: Never Used  . Alcohol use No  . Drug use: No  . Sexual activity: Not on file

## 2016-08-26 NOTE — Telephone Encounter (Signed)
Euflexxa being done.

## 2016-11-02 ENCOUNTER — Ambulatory Visit (INDEPENDENT_AMBULATORY_CARE_PROVIDER_SITE_OTHER): Payer: PPO | Admitting: Orthopedic Surgery

## 2016-11-03 ENCOUNTER — Ambulatory Visit (INDEPENDENT_AMBULATORY_CARE_PROVIDER_SITE_OTHER): Payer: PPO | Admitting: Orthopaedic Surgery

## 2016-11-16 ENCOUNTER — Encounter (INDEPENDENT_AMBULATORY_CARE_PROVIDER_SITE_OTHER): Payer: Self-pay | Admitting: Orthopedic Surgery

## 2016-11-16 ENCOUNTER — Ambulatory Visit (INDEPENDENT_AMBULATORY_CARE_PROVIDER_SITE_OTHER): Payer: PPO | Admitting: Orthopedic Surgery

## 2016-11-16 VITALS — BP 107/60 | HR 71 | Ht 63.0 in | Wt 127.0 lb

## 2016-11-16 DIAGNOSIS — M1711 Unilateral primary osteoarthritis, right knee: Secondary | ICD-10-CM

## 2016-11-16 MED ORDER — LIDOCAINE HCL 1 % IJ SOLN
3.0000 mL | INTRAMUSCULAR | Status: AC | PRN
  Administered 2016-11-16: 3 mL

## 2016-11-16 MED ORDER — METHYLPREDNISOLONE ACETATE 40 MG/ML IJ SUSP
80.0000 mg | INTRAMUSCULAR | Status: AC | PRN
  Administered 2016-11-16: 80 mg

## 2016-11-16 MED ORDER — BUPIVACAINE HCL 0.5 % IJ SOLN
3.0000 mL | INTRAMUSCULAR | Status: AC | PRN
  Administered 2016-11-16: 3 mL via INTRA_ARTICULAR

## 2016-11-16 NOTE — Progress Notes (Signed)
Office Visit Note   [name]           Date of Birth: [birth year]           MRN: [id number] Visit Date: 11/16/2016              Requested by: [name], MD [address] [place], [place] [address] PCP: [name], MD   Assessment & Plan: Visit Diagnoses:  1. Unilateral primary osteoarthritis, right knee     Plan:  #1: Corticosteroid injection to the right knee was given. #2: We reviewed her MRI scan reported can which may have a possibility of meniscal tear of the medial joint line. She has progression of moderate medial compartment osteoarthritis.  Follow-Up Instructions: Return if symptoms worsen or fail to improve.   Orders:  No orders of the defined types were placed in this encounter.  No orders of the defined types were placed in this encounter.     Procedures: Large Joint Inj Date/Time: 11/16/2016 3:13 PM Performed by: Biagio Borg D Authorized by: Biagio Borg D   Consent Given by:  Patient Timeout: prior to procedure the correct patient, procedure, and site was verified   Indications:  Pain and joint swelling Location:  Knee Site:  R knee Prep: patient was prepped and draped in usual sterile fashion   Needle Size:  25 G Needle Length:  1.5 inches Approach:  Anteromedial Ultrasound Guidance: No   Fluoroscopic Guidance: No   Arthrogram: No   Medications:  80 mg methylPREDNISolone acetate 40 MG/ML; 3 mL bupivacaine 0.5 %; 3 mL lidocaine 1 % Aspiration Attempted: No   Patient tolerance:  Patient tolerated the procedure well with no immediate complications     Clinical Data: No additional findings.   Subjective: Chief Complaint  Patient presents with  . Follow-up    r knee pain cortisone shot    HPI  Taylor Taylor is a very pleasant 71 year old white female who presents today with continued pain in the right knee. She has had multiple cortisone injections dating back to 10/16/2015 and 01/21/2016. She did have a  series of 3 Euflex injections to the right knee beginning 08/10/2016 finishing 08/24/2016. Her symptoms had the improved until most recently. She is now having pain in the right knee more on the medial aspect of the knee. She does have some occasional giving way symptoms in the knee also. She has occasional swelling. She would like to consider corticosteroid injection today to the right knee.  Review of Systems  Constitutional: Negative.   HENT: Negative.   Eyes: Negative.   Respiratory: Negative.   Cardiovascular: Negative.   Gastrointestinal: Negative.   Endocrine: Negative.   Genitourinary: Positive for flank pain, frequency and urgency.  Musculoskeletal: Positive for back pain.  Skin: Negative.   Allergic/Immunologic: Negative.   Neurological: Negative.   Hematological: Negative.   Psychiatric/Behavioral: Negative.      Objective: Vital Signs: BP 107/60 (BP Location: Left Arm, Patient Position: Sitting, Cuff Size: Normal)   Pulse 71   Ht 5\' 3"  (1.6 m)   Wt 127 lb (57.6 kg)   BMI 22.50 kg/m   Physical Exam  Constitutional: She is oriented to person, place, and time. She appears well-developed and well-nourished.  HENT:  Head: Normocephalic and atraumatic.  Eyes: Pupils are equal, round, and reactive to light. EOM are normal.  Pulmonary/Chest: Effort normal.  Neurological: She is alert and oriented to person, place, and time.  Skin: Skin  is warm and dry.  Psychiatric: She has a normal mood and affect. Her behavior is normal. Judgment and thought content normal.    Ortho Exam  Today she has motion from near full extension to about 105. She does have a trace effusion. She's tender at the medial joint line. Little bit of crepitance with range of motion. Calf is supple and nontender. Nervous intact distally.   Specialty Comments:  No specialty comments available.  Imaging: CLINICAL DATA:  Right knee pain for several months. History of prior meniscal surgery  approximately 10 years ago.  EXAM: MRI OF THE RIGHT KNEE WITHOUT CONTRAST  TECHNIQUE: Multiplanar, multisequence MR imaging of the knee was performed. No intravenous contrast was administered.  COMPARISON:  MRI right knee 12/15/2010  FINDINGS: MENISCI  Medial meniscus: The body of the medial meniscus is markedly diminutive compared to the prior examination. The posterior horn of the medial meniscus also appears more diminutive than on the prior study. In the periphery of the posterior horn, abnormal signal reaches the meniscal undersurface. The anterior horn is unremarkable.  Lateral meniscus:  Intact.  LIGAMENTS  Cruciates: Marked mucoid degeneration the ACL has progressed since the prior study. The ligament is intact. The PCL is intact.  Collaterals:  Intact.  CARTILAGE  Patellofemoral:  Negative.  Medial: Progressive thinning is present with associated joint space narrowing.  Lateral:  Negative.  Joint:  Small joint effusion.  Popliteal Fossa: Baker's cyst measures approximately 3.1 cm transverse by 0.9 cm AP by 7.1 cm craniocaudal.  Extensor Mechanism:  Intact.  Bones: No fracture or worrisome lesion. Normal marrow signal throughout.  Other: None.  IMPRESSION: The posterior horn and body of the medial meniscus are markedly diminutive compared to the most recent MRI. The appearance is most suggestive of prior meniscectomy although the patient reports no history of surgery in the past 10 years. Alternatively, this finding could represent severe degenerative maceration, particularly in the body.  Marked mucoid degeneration the ACL is progressed since the prior examination. No tear.  Baker's cyst.  Some progression of moderate medial compartment osteoarthritis.   Electronically Signed   By: Inge Rise M.D.   On: 03/04/2016 09:09  PMFS History: Patient Active Problem List   Diagnosis Date Noted  . Unilateral  primary osteoarthritis, right knee 08/24/2016  . Hypertriglyceridemia without hypercholesterolemia 09/03/2013  . CHEST PAIN-PRECORDIAL 08/31/2009  . CHEST PAIN, ATYPICAL 08/28/2009   Past Medical History:  Diagnosis Date  . Allergy    Seasonal  . Arthritis    back  . GERD (gastroesophageal reflux disease)    in past    Family History  Problem Relation Age of Onset  . Parkinson's disease Mother   . Brain cancer Father     Past Surgical History:  Procedure Laterality Date  . CERVICAL DISC SURGERY     over 10 years  . COLONOSCOPY     Social History   Occupational History  . Not on file.   Social History Main Topics  . Smoking status: Never Smoker  . Smokeless tobacco: Never Used  . Alcohol use No  . Drug use: No  . Sexual activity: Not on file

## 2016-11-24 DIAGNOSIS — Z124 Encounter for screening for malignant neoplasm of cervix: Secondary | ICD-10-CM | POA: Diagnosis not present

## 2016-11-24 DIAGNOSIS — Z01419 Encounter for gynecological examination (general) (routine) without abnormal findings: Secondary | ICD-10-CM | POA: Diagnosis not present

## 2016-12-21 ENCOUNTER — Encounter: Payer: Self-pay | Admitting: Podiatry

## 2016-12-21 ENCOUNTER — Ambulatory Visit (INDEPENDENT_AMBULATORY_CARE_PROVIDER_SITE_OTHER): Payer: PPO | Admitting: Podiatry

## 2016-12-21 DIAGNOSIS — B351 Tinea unguium: Secondary | ICD-10-CM

## 2016-12-21 MED ORDER — KETOCONAZOLE 2 % EX CREA: TOPICAL_CREAM | CUTANEOUS | 0 refills | Status: DC

## 2016-12-21 NOTE — Patient Instructions (Signed)

## 2017-01-05 DIAGNOSIS — L570 Actinic keratosis: Secondary | ICD-10-CM | POA: Diagnosis not present

## 2017-01-05 DIAGNOSIS — D1801 Hemangioma of skin and subcutaneous tissue: Secondary | ICD-10-CM | POA: Diagnosis not present

## 2017-01-05 DIAGNOSIS — D225 Melanocytic nevi of trunk: Secondary | ICD-10-CM | POA: Diagnosis not present

## 2017-01-05 DIAGNOSIS — L821 Other seborrheic keratosis: Secondary | ICD-10-CM | POA: Diagnosis not present

## 2017-01-10 NOTE — Progress Notes (Signed)
  Subjective:  Patient ID: Taylor Taylor, female    DOB: [birth year],  MRN: [id number]  Chief Complaint  Patient presents with  . Nail Problem    i have some fungus on my toenails     71 y.o. female presents with the above complaint.  That her nails are thick and discolored sometimes they hurt.  Has tried to cut them herself.  Endorses that she gets frequent pedicures  Past Medical History:  Diagnosis Date  . Allergy    Seasonal  . Arthritis    back  . GERD (gastroesophageal reflux disease)    in past   Past Surgical History:  Procedure Laterality Date  . CERVICAL DISC SURGERY     over 10 years  . COLONOSCOPY      Current Outpatient Prescriptions:  .  acetaminophen (TYLENOL) 500 MG tablet, Take 500 mg by mouth 2 (two) times daily., Disp: , Rfl:  .  calcium carbonate (OS-CAL) 600 MG TABS tablet, Take 600 mg by mouth 2 (two) times daily with a meal., Disp: , Rfl:  .  COD LIVER OIL PO, Take by mouth daily., Disp: , Rfl:  .  diclofenac (VOLTAREN) 75 MG EC tablet, Take 1 tablet (75 mg total) by mouth 2 (two) times daily., Disp: 180 tablet, Rfl: 2 .  estradiol (ESTRACE) 0.5 MG tablet, , Disp: , Rfl:  .  ferrous sulfate 325 (65 FE) MG tablet, Take 325 mg by mouth daily with breakfast., Disp: , Rfl:  .  Fish Oil-Cholecalciferol (FISH OIL + D3) 1200-1000 MG-UNIT CAPS, Take 1 tablet by mouth daily., Disp: , Rfl:  .  fluticasone (FLONASE) 50 MCG/ACT nasal spray, Place 2 sprays into both nostrils daily. (Patient not taking: Reported on 11/16/2016), Disp: 48 g, Rfl: 4 .  Glucosamine-Chondroit-Vit C-Mn (GLUCOSAMINE CHONDR 1500 COMPLX) CAPS, Take 1 capsule by mouth 2 (two) times daily., Disp: , Rfl:  .  ketoconazole (NIZORAL) 2 % cream, Apply 1 fingertip amount to all toenails daily., Disp: 30 g, Rfl: 0 .  Multiple Vitamins-Minerals (WOMENS MULTI VITAMIN & MINERAL PO), Take 1 tablet by mouth daily., Disp: , Rfl:  .  progesterone (PROMETRIUM) 100 MG capsule, Take 1 capsule (100 mg total) by mouth  daily., Disp: 90 capsule, Rfl: 3 .  Red Yeast Rice 600 MG CAPS, Take by mouth daily., Disp: , Rfl:  .  Turmeric 500 MG TABS, Take by mouth daily., Disp: , Rfl:   Current Facility-Administered Medications:  .  0.9 %  sodium chloride infusion, 500 mL, Intravenous, Continuous, Pyrtle, Lajuan Lines, MD  Allergies  Allergen Reactions  . Pollen Extract    Review of Systems Objective:  There were no vitals filed for this visit. General AA&O x3. Normal mood and affect.  Vascular Dorsalis pedis and posterior tibial pulses  present 2+ bilaterally  Capillary refill normal to all digits. Pedal hair growth diminished.  Neurologic Epicritic sensation grossly present.  Dermatologic No open lesions. Interspaces clear of maceration. Nails x 2 with thickness and discoloration.  Orthopedic: MMT 5/5 in dorsiflexion, plantarflexion, inversion, and eversion. Normal joint ROM without pain or crepitus.   Assessment & Plan:  Patient was evaluated and treated and all questions answered.  Onychomycosis -Educated on etiology. -Rx topical ketoconazole -Will hold off oral therapy.  Return in about 3 months (around 03/23/2017).

## 2017-01-21 ENCOUNTER — Other Ambulatory Visit: Payer: Self-pay | Admitting: Family Medicine

## 2017-03-30 ENCOUNTER — Ambulatory Visit: Payer: PPO | Admitting: Podiatry

## 2017-03-30 ENCOUNTER — Encounter: Payer: Self-pay | Admitting: Podiatry

## 2017-03-30 DIAGNOSIS — B351 Tinea unguium: Secondary | ICD-10-CM | POA: Diagnosis not present

## 2017-03-30 MED ORDER — KETOCONAZOLE 2 % EX CREA: TOPICAL_CREAM | CUTANEOUS | 0 refills | Status: DC

## 2017-03-30 NOTE — Progress Notes (Signed)
  Subjective:  Patient ID: Taylor Taylor, female    DOB: 07-16-1945,  MRN: [id number]  No chief complaint on file.  72 y.o. female returns for the above complaint. Staets the nails look a little better.  Objective:  There were no vitals filed for this visit. General AA&O x3. Normal mood and affect.  Vascular Pedal pulses palpable.  Neurologic Epicritic sensation grossly intact.  Dermatologic No open lesions. Skin normal texture and turgor. Some proximal clearing noted to the nails.  Orthopedic: No pain to palpation either foot.   Assessment & Plan:  Patient was evaluated and treated and all questions answered.  Onychomycosis -Nails improving. -Refill ketoconazole  Return in about 6 weeks (around 05/11/2017).

## 2017-05-12 ENCOUNTER — Ambulatory Visit: Payer: PPO | Admitting: Podiatry

## 2017-05-23 ENCOUNTER — Other Ambulatory Visit: Payer: PPO

## 2017-05-23 DIAGNOSIS — Z0000 Encounter for general adult medical examination without abnormal findings: Secondary | ICD-10-CM

## 2017-05-23 DIAGNOSIS — E781 Pure hyperglyceridemia: Secondary | ICD-10-CM | POA: Diagnosis not present

## 2017-05-24 ENCOUNTER — Other Ambulatory Visit: Payer: Self-pay | Admitting: Family Medicine

## 2017-05-24 DIAGNOSIS — Z1231 Encounter for screening mammogram for malignant neoplasm of breast: Secondary | ICD-10-CM

## 2017-05-24 LAB — CBC WITH DIFFERENTIAL/PLATELET
Basophils Absolute: 62 cells/uL (ref 0–200)
Basophils Relative: 1.3 %
Eosinophils Absolute: 221 cells/uL (ref 15–500)
Eosinophils Relative: 4.6 %
HCT: 35.7 % (ref 35.0–45.0)
Hemoglobin: 12.1 g/dL (ref 11.7–15.5)
Lymphs Abs: 2222 cells/uL (ref 850–3900)
MCH: 31.3 pg (ref 27.0–33.0)
MCHC: 33.9 g/dL (ref 32.0–36.0)
MCV: 92.2 fL (ref 80.0–100.0)
MPV: 9.8 fL (ref 7.5–12.5)
Monocytes Relative: 9.6 %
Neutro Abs: 1834 cells/uL (ref 1500–7800)
Neutrophils Relative %: 38.2 %
Platelets: 286 10*3/uL (ref 140–400)
RBC: 3.87 10*6/uL (ref 3.80–5.10)
RDW: 12 % (ref 11.0–15.0)
Total Lymphocyte: 46.3 %
WBC mixed population: 461 cells/uL (ref 200–950)
WBC: 4.8 10*3/uL (ref 3.8–10.8)

## 2017-05-24 LAB — LIPID PANEL
Cholesterol: 207 mg/dL — ABNORMAL HIGH (ref <200)
HDL: 90 mg/dL (ref >50)
LDL Cholesterol (Calc): 101 mg/dL (calc) — ABNORMAL HIGH
Non-HDL Cholesterol (Calc): 117 mg/dL (calc) (ref <130)
Total CHOL/HDL Ratio: 2.3 (calc) (ref <5.0)
Triglycerides: 72 mg/dL (ref <150)

## 2017-05-24 LAB — COMPLETE METABOLIC PANEL WITH GFR
AG Ratio: 1.8 (calc) (ref 1.0–2.5)
ALT: 12 U/L (ref 6–29)
AST: 17 U/L (ref 10–35)
Albumin: 4.3 g/dL (ref 3.6–5.1)
Alkaline phosphatase (APISO): 29 U/L — ABNORMAL LOW (ref 33–130)
BUN/Creatinine Ratio: 26 (calc) — ABNORMAL HIGH (ref 6–22)
BUN: 26 mg/dL — ABNORMAL HIGH (ref 7–25)
CO2: 28 mmol/L (ref 20–32)
Calcium: 10 mg/dL (ref 8.6–10.4)
Chloride: 106 mmol/L (ref 98–110)
Creat: 0.99 mg/dL — ABNORMAL HIGH (ref 0.60–0.93)
GFR, Est African American: 67 mL/min/{1.73_m2} (ref >=60)
GFR, Est Non African American: 58 mL/min/{1.73_m2} — ABNORMAL LOW (ref >=60)
Globulin: 2.4 g/dL (calc) (ref 1.9–3.7)
Glucose, Bld: 86 mg/dL (ref 65–99)
Potassium: 4.9 mmol/L (ref 3.5–5.3)
Sodium: 140 mmol/L (ref 135–146)
Total Bilirubin: 0.5 mg/dL (ref 0.2–1.2)
Total Protein: 6.7 g/dL (ref 6.1–8.1)

## 2017-05-25 ENCOUNTER — Encounter: Payer: Self-pay | Admitting: Family Medicine

## 2017-05-25 ENCOUNTER — Ambulatory Visit (INDEPENDENT_AMBULATORY_CARE_PROVIDER_SITE_OTHER): Payer: PPO | Admitting: Family Medicine

## 2017-05-25 VITALS — BP 100/62 | HR 60 | Temp 98.2°F | Resp 16 | Ht 64.0 in | Wt 129.0 lb

## 2017-05-25 DIAGNOSIS — Z0000 Encounter for general adult medical examination without abnormal findings: Secondary | ICD-10-CM | POA: Diagnosis not present

## 2017-05-25 DIAGNOSIS — R5382 Chronic fatigue, unspecified: Secondary | ICD-10-CM | POA: Diagnosis not present

## 2017-05-25 NOTE — Progress Notes (Signed)
Subjective:    Patient ID: Taylor Taylor, female    DOB: 1945/09/16, 72 y.o.   MRN: [id number]  HPI Patient is a very pleasant 72 year old Caucasian female here for complete physical exam. Review of systems is significant for mild fatigue. She denies any other concerning features. Specifically she denies any fevers or weight loss or shortness of breath or unexplained bleeding. She denies any depression. Lab work was unremarkable aside from some mild kidney damage. Patient is taking diclofenac regularly. I have recommended decreasing the amount of diclofenac she is taking due to concerns about her kidney function. Mammogram is due in April and the patient has started to schedule this. Due to her age, she does not require a Pap smear. Her colonoscopy was performed last year and was normal. She does not require another repeat colonoscopy. Last bone density was in 2017 and showed low bone mass. Repeat bone density could be performed in 2020. Immunization records and lab work as listed below: Immunization History  Administered Date(s) Administered  . Hepatitis A 12/10/1997, 09/17/1998  . Hepatitis B 12/10/1997, 01/09/1998, 08/11/1998  . Influenza Split 01/18/2012  . Influenza, High Dose Seasonal PF 12/22/2015  . Influenza,inj,Quad PF,6+ Mos 01/01/2013, 01/07/2014, 12/30/2014  . Pneumococcal Conjugate-13 07/17/2014  . Pneumococcal Polysaccharide-23 07/12/2011  . Td 12/09/1997  . Tdap 07/12/2011  . Zoster 02/26/2010  . Zoster Recombinat (Shingrix) 08/12/2016    Lab on 05/23/2017  Component Date Value Ref Range Status  . WBC 05/23/2017 4.8  3.8 - 10.8 Thousand/uL Final  . RBC 05/23/2017 3.87  3.80 - 5.10 Million/uL Final  . Hemoglobin 05/23/2017 12.1  11.7 - 15.5 g/dL Final  . HCT 05/23/2017 35.7  35.0 - 45.0 % Final  . MCV 05/23/2017 92.2  80.0 - 100.0 fL Final  . MCH 05/23/2017 31.3  27.0 - 33.0 pg Final  . MCHC 05/23/2017 33.9  32.0 - 36.0 g/dL Final  . RDW 05/23/2017 12.0  11.0 - 15.0 %  Final  . Platelets 05/23/2017 286  140 - 400 Thousand/uL Final  . MPV 05/23/2017 9.8  7.5 - 12.5 fL Final  . Neutro Abs 05/23/2017 1,834  1,500 - 7,800 cells/uL Final  . Lymphs Abs 05/23/2017 2,222  850 - 3,900 cells/uL Final  . WBC mixed population 05/23/2017 461  200 - 950 cells/uL Final  . Eosinophils Absolute 05/23/2017 221  15 - 500 cells/uL Final  . Basophils Absolute 05/23/2017 62  0 - 200 cells/uL Final  . Neutrophils Relative % 05/23/2017 38.2  % Final  . Total Lymphocyte 05/23/2017 46.3  % Final  . Monocytes Relative 05/23/2017 9.6  % Final  . Eosinophils Relative 05/23/2017 4.6  % Final  . Basophils Relative 05/23/2017 1.3  % Final  . Glucose, Bld 05/23/2017 86  65 - 99 mg/dL Final   Comment: .            Fasting reference interval .   . BUN 05/23/2017 26* 7 - 25 mg/dL Final  . Creat 05/23/2017 0.99* 0.60 - 0.93 mg/dL Final   Comment: For patients >58 years of age, the reference limit for Creatinine is approximately 13% higher for people identified as African-American. .   . GFR, Est Non African American 05/23/2017 58* > OR = 60 mL/min/1.60m2 Final  . GFR, Est African American 05/23/2017 67  > OR = 60 mL/min/1.36m2 Final  . BUN/Creatinine Ratio 05/23/2017 26* 6 - 22 (calc) Final  . Sodium 05/23/2017 140  135 - 146 mmol/L Final  .  Potassium 05/23/2017 4.9  3.5 - 5.3 mmol/L Final  . Chloride 05/23/2017 106  98 - 110 mmol/L Final  . CO2 05/23/2017 28  20 - 32 mmol/L Final  . Calcium 05/23/2017 10.0  8.6 - 10.4 mg/dL Final  . Total Protein 05/23/2017 6.7  6.1 - 8.1 g/dL Final  . Albumin 05/23/2017 4.3  3.6 - 5.1 g/dL Final  . Globulin 05/23/2017 2.4  1.9 - 3.7 g/dL (calc) Final  . AG Ratio 05/23/2017 1.8  1.0 - 2.5 (calc) Final  . Total Bilirubin 05/23/2017 0.5  0.2 - 1.2 mg/dL Final  . Alkaline phosphatase (APISO) 05/23/2017 29* 33 - 130 U/L Final  . AST 05/23/2017 17  10 - 35 U/L Final  . ALT 05/23/2017 12  6 - 29 U/L Final  . Cholesterol 05/23/2017 207* <200 mg/dL  Final  . HDL 05/23/2017 90  >50 mg/dL Final  . Triglycerides 05/23/2017 72  <150 mg/dL Final  . LDL Cholesterol (Calc) 05/23/2017 101* mg/dL (calc) Final   Comment: Reference range: <100 . Desirable range <100 mg/dL for primary prevention;   <70 mg/dL for patients with CHD or diabetic patients  with > or = 2 CHD risk factors. Marland Kitchen LDL-C is now calculated using the Martin-Hopkins  calculation, which is a validated novel method providing  better accuracy than the Friedewald equation in the  estimation of LDL-C.  Cresenciano Genre et al. Annamaria Helling. 9417;408(14): 2061-2068  (http://education.[facility].com/faq/FAQ164)   . Total CHOL/HDL Ratio 05/23/2017 2.3  <5.0 (calc) Final  . Non-HDL Cholesterol (Calc) 05/23/2017 117  <130 mg/dL (calc) Final   Comment: For patients with diabetes plus 1 major ASCVD risk  factor, treating to a non-HDL-C goal of <100 mg/dL  (LDL-C of <70 mg/dL) is considered a therapeutic  option.     Past Medical History:  Diagnosis Date  . Allergy    Seasonal  . Arthritis    back  . GERD (gastroesophageal reflux disease)    in past   Past Surgical History:  Procedure Laterality Date  . CERVICAL DISC SURGERY     over 10 years  . COLONOSCOPY     Current Outpatient Medications on File Prior to Visit  Medication Sig Dispense Refill  . acetaminophen (TYLENOL) 500 MG tablet Take 500 mg by mouth 2 (two) times daily.    . calcium carbonate (OS-CAL) 600 MG TABS tablet Take 600 mg by mouth 2 (two) times daily with a meal.    . COD LIVER OIL PO Take by mouth daily.    . diclofenac (VOLTAREN) 75 MG EC tablet Take 1 tablet by mouth twice a day 180 tablet 1  . estradiol (ESTRACE) 0.5 MG tablet     . ferrous sulfate 325 (65 FE) MG tablet Take 325 mg by mouth daily with breakfast.    . Fish Oil-Cholecalciferol (FISH OIL + D3) 1200-1000 MG-UNIT CAPS Take 1 tablet by mouth daily.    . Glucosamine-Chondroit-Vit C-Mn (GLUCOSAMINE CHONDR 1500 COMPLX) CAPS Take 1 capsule by mouth 2  (two) times daily.    Marland Kitchen ketoconazole (NIZORAL) 2 % cream Apply 1 fingertip amount to all toenails daily. 30 g 0  . Melatonin 10 MG CAPS Take by mouth.    . Multiple Vitamins-Minerals (WOMENS MULTI VITAMIN & MINERAL PO) Take 1 tablet by mouth daily.    . progesterone (PROMETRIUM) 100 MG capsule Take 1 capsule (100 mg total) by mouth daily. 90 capsule 3  . Red Yeast Rice 600 MG CAPS Take by mouth daily.    Marland Kitchen  Turmeric 500 MG TABS Take by mouth daily.     Current Facility-Administered Medications on File Prior to Visit  Medication Dose Route Frequency Provider Last Rate Last Dose  . 0.9 %  sodium chloride infusion  500 mL Intravenous Continuous Pyrtle, Lajuan Lines, MD       Allergies  Allergen Reactions  . Pollen Extract    Social History   Socioeconomic History  . Marital status: Married    Spouse name: Not on file  . Number of children: Not on file  . Years of education: Not on file  . Highest education level: Not on file  Social Needs  . Financial resource strain: Not on file  . Food insecurity - worry: Not on file  . Food insecurity - inability: Not on file  . Transportation needs - medical: Not on file  . Transportation needs - non-medical: Not on file  Occupational History  . Not on file  Tobacco Use  . Smoking status: Never Smoker  . Smokeless tobacco: Never Used  Substance and Sexual Activity  . Alcohol use: No  . Drug use: No  . Sexual activity: Not on file  Other Topics Concern  . Not on file  Social History Narrative  . Not on file      Review of Systems  All other systems reviewed and are negative.      Objective:   Physical Exam  Constitutional: She is oriented to person, place, and time. She appears well-developed and well-nourished. No distress.  HENT:  Head: Normocephalic and atraumatic.  Right Ear: External ear normal.  Left Ear: External ear normal.  Nose: Nose normal.  Mouth/Throat: Oropharynx is clear and moist. No oropharyngeal exudate.  Eyes:  Conjunctivae and EOM are normal. Pupils are equal, round, and reactive to light.  Neck: Neck supple. No JVD present. No tracheal deviation present. No thyromegaly present.  Cardiovascular: Normal rate, regular rhythm and normal heart sounds. Exam reveals no gallop and no friction rub.  No murmur heard. Pulmonary/Chest: Effort normal and breath sounds normal. No respiratory distress. She has no wheezes. She has no rales. She exhibits no tenderness.  Abdominal: Soft. Bowel sounds are normal. She exhibits no distension and no mass. There is no tenderness. There is no rebound and no guarding.  Musculoskeletal: Normal range of motion. She exhibits no tenderness.  Lymphadenopathy:    She has no cervical adenopathy.  Neurological: She is alert and oriented to person, place, and time. She displays normal reflexes. No cranial nerve deficit. She exhibits normal muscle tone. Coordination normal.  Skin: No rash noted. She is not diaphoretic.  Vitals reviewed.         Assessment & Plan:  Chronic fatigue - Plan: TSH, Sedimentation rate  Routine general medical examination at a health care facility Physical exam today is completely normal. Lab work is excellent. Immunizations are up-to-date. I did recommend that the patient decrease the amount of diclofenac she is taking because of her chronic kidney disease. Because of her fatigue, I would like to check a TSH and sedimentation rate just to complete the diagnostic workup. Cancer screening is up-to-date. Immunizations are up-to-date. Regular anticipatory guidance is provided.

## 2017-05-26 ENCOUNTER — Encounter: Payer: Self-pay | Admitting: Family Medicine

## 2017-05-26 LAB — SEDIMENTATION RATE: Sed Rate: 2 mm/h (ref 0–30)

## 2017-05-26 LAB — TSH: TSH: 1.38 mIU/L (ref 0.40–4.50)

## 2017-07-17 ENCOUNTER — Ambulatory Visit
Admission: RE | Admit: 2017-07-17 | Discharge: 2017-07-17 | Disposition: A | Discharge status: Home / Self Care | Payer: PPO | Source: Ambulatory Visit | Attending: Family Medicine | Admitting: Family Medicine

## 2017-07-17 DIAGNOSIS — Z1231 Encounter for screening mammogram for malignant neoplasm of breast: Secondary | ICD-10-CM

## 2017-08-03 ENCOUNTER — Telehealth: Payer: Self-pay | Admitting: Family Medicine

## 2017-08-03 NOTE — Telephone Encounter (Signed)
Pt called stating that Dr. Dennard Schaumann decreased her Diclofenac to once a day and she tried that but had to go back up to bid as her back pain increased dramatically and would like a prescription sent to mail order for the bid dosing.  Per Dr. Dennard Schaumann he does not recommend bid dosing b/c of her stage 3 kidney disease. She should be on once a day dosing and can supplement with tylenol. If pt is instant on bid ok to refill but try to limit bid dosing as much as possible and supplement with tylenol. We also will need to monitor her kdn fcn tests q 6 months with the bid dosing.   [facility] on pt's home phone

## 2017-08-04 MED ORDER — DICLOFENAC SODIUM 75 MG PO TBEC: 75.0000 mg | DELAYED_RELEASE_TABLET | Freq: Two times a day (BID) | ORAL | 1 refills | Status: DC

## 2017-08-04 NOTE — Telephone Encounter (Signed)
Patient aware of providers recommendations and will only take bid if needed and will check her labs for kidney function every 6 months. Med sent to pharm.

## 2017-10-26 ENCOUNTER — Ambulatory Visit (INDEPENDENT_AMBULATORY_CARE_PROVIDER_SITE_OTHER): Payer: PPO | Admitting: Orthopaedic Surgery

## 2017-10-26 ENCOUNTER — Encounter (INDEPENDENT_AMBULATORY_CARE_PROVIDER_SITE_OTHER): Payer: Self-pay | Admitting: Orthopaedic Surgery

## 2017-10-26 ENCOUNTER — Ambulatory Visit (INDEPENDENT_AMBULATORY_CARE_PROVIDER_SITE_OTHER): Payer: PPO

## 2017-10-26 VITALS — BP 142/74 | HR 75 | Resp 14 | Ht 63.0 in | Wt 123.0 lb

## 2017-10-26 DIAGNOSIS — M5136 Other intervertebral disc degeneration, lumbar region: Secondary | ICD-10-CM

## 2017-10-26 DIAGNOSIS — M5431 Sciatica, right side: Secondary | ICD-10-CM

## 2017-10-26 MED ORDER — METHYLPREDNISOLONE 4 MG PO TBPK: ORAL_TABLET | ORAL | 0 refills | Status: DC

## 2017-10-26 NOTE — Progress Notes (Signed)
Office Visit Note   [name]           Date of Birth: 01/24/1946           MRN: [id number] Visit Date: 10/26/2017              Requested by: [name], MD [address] [place], [place] [address] PCP: [name], MD   Assessment & Plan: Visit Diagnoses:  1. Sciatica, right side   2. Other intervertebral disc degeneration, lumbar region     Plan:  #1: Medrol Dosepak #2: Schedule MRI scan of the lumbar spine.  She now lacks Achilles reflex..  Last MRI scan was April 2009.  Follow-Up Instructions: Return in about 2 weeks (around 11/09/2017).   Orders:  Orders Placed This Encounter  Procedures  . XR Lumbar Spine 2-3 Views  . MR Lumbar Spine w/o contrast   Meds ordered this encounter  Medications  . DISCONTD: methylPREDNISolone (MEDROL DOSEPAK) 4 MG TBPK tablet    Sig: As directed    Dispense:  21 tablet    Refill:  0    Order Specific Question:   Supervising Provider    Answer:   Garald Balding [[id number]]  . methylPREDNISolone (MEDROL DOSEPAK) 4 MG TBPK tablet    Sig: As directed    Dispense:  21 tablet    Refill:  0    Order Specific Question:   Supervising Provider    Answer:   Garald Balding [[id number]]      Procedures: No procedures performed   Clinical Data: No additional findings.   Subjective: Chief Complaint  Patient presents with  . Right Hip - Pain  . Right Leg - Pain  . Left Leg - Pain  . Hip Pain    Right Hip pain x 1 week, sciatica, no injury, no surgery to right hip, not diabetic, pain from hip down lateral lower leg, no back pain,     HPI  Taylor Taylor is a very pleasant 72 year old white female who presents today with pain in her lumbar spine and down both leg.  This started about a week or so ago with pain mainly in the lumbar spine and in the right buttock.  It is now progressed down the right lateral aspect of the leg down to the foot and also some pain now on the left lateral aspect of the left leg.  She has  previously had problems with back pain and sciatica she is undergone injections which were quite beneficial.  She has not had an MRI scan for about 10 years.  She denies any groin pain.   Review of Systems  Constitutional: Negative for activity change.  HENT: Negative for trouble swallowing.   Eyes: Negative for pain.  Respiratory: Negative for shortness of breath.   Cardiovascular: Negative for chest pain.  Gastrointestinal: Negative for constipation.  Endocrine: Negative for cold intolerance.  Genitourinary: Negative for difficulty urinating.  Musculoskeletal: Negative for back pain.  Skin: Negative for rash.  Allergic/Immunologic: Negative for food allergies.  Neurological: Negative for weakness.  Hematological: Does not bruise/bleed easily.  Psychiatric/Behavioral: Positive for sleep disturbance.     Objective: Vital Signs: BP (!) 142/74 (BP Location: Right Arm, Patient Position: Sitting, Cuff Size: Normal)   Pulse 75   Resp 14   Ht 5\' 3"  (1.6 m)   Wt 123 lb (55.8 kg)   BMI 21.79 kg/m   Physical Exam  Constitutional: She is oriented to  person, place, and time. She appears well-developed and well-nourished.  HENT:  Mouth/Throat: Oropharynx is clear and moist.  Eyes: Pupils are equal, round, and reactive to light. EOM are normal.  Pulmonary/Chest: Effort normal.  Neurological: She is alert and oriented to person, place, and time.  Skin: Skin is warm and dry.  Psychiatric: She has a normal mood and affect. Her behavior is normal.    Ortho Exam  Exam today reveals smooth motion of both hips without decrease her pain.  She has a negative straight leg raising bilaterally at 90 degrees.  She has 2+ knee reflex and this is bilateral.  On the left leg she has a 2+ Achilles reflex and absent on the right ankle.  She has good strength overall bilateral and symmetric.  Sensation is intact to light touch bilaterally and symmetric.  She does have tenderness over the sciatic nerve in  the right buttock area.  Some pain to palpation over the lower lumbar spine.  Specialty Comments:  No specialty comments available.  Imaging: Xr Lumbar Spine 2-3 Views  Result Date: 10/26/2017 2 view x-ray of the lumbar spine reveals nipping and degenerative disc disease at L5-S1 with almost complete collapse of the space.  She does have some anterior spurring at the L5-S1.  Some facet changes at L4 and L5.    PMFS History: Current Outpatient Medications  Medication Sig Dispense Refill  . acetaminophen (TYLENOL) 500 MG tablet Take 500 mg by mouth 2 (two) times daily.    . calcium carbonate (OS-CAL) 600 MG TABS tablet Take 600 mg by mouth 2 (two) times daily with a meal.    . COD LIVER OIL PO Take by mouth daily.    . diclofenac (VOLTAREN) 75 MG EC tablet Take 1 tablet (75 mg total) by mouth 2 (two) times daily. 180 tablet 1  . estradiol (ESTRACE) 0.5 MG tablet     . ferrous sulfate 325 (65 FE) MG tablet Take 325 mg by mouth daily with breakfast.    . Fish Oil-Cholecalciferol (FISH OIL + D3) 1200-1000 MG-UNIT CAPS Take 1 tablet by mouth daily.    . Glucosamine-Chondroit-Vit C-Mn (GLUCOSAMINE CHONDR 1500 COMPLX) CAPS Take 1 capsule by mouth 2 (two) times daily.    . Melatonin 10 MG CAPS Take by mouth.    . Multiple Vitamins-Minerals (WOMENS MULTI VITAMIN & MINERAL PO) Take 1 tablet by mouth daily.    . progesterone (PROMETRIUM) 100 MG capsule Take 1 capsule (100 mg total) by mouth daily. 90 capsule 3  . Red Yeast Rice 600 MG CAPS Take by mouth daily.    . Turmeric 500 MG TABS Take by mouth daily.    Marland Kitchen ketoconazole (NIZORAL) 2 % cream Apply 1 fingertip amount to all toenails daily. (Patient not taking: Reported on 10/26/2017) 30 g 0  . methylPREDNISolone (MEDROL DOSEPAK) 4 MG TBPK tablet As directed 21 tablet 0   Current Facility-Administered Medications  Medication Dose Route Frequency Provider Last Rate Last Dose  . 0.9 %  sodium chloride infusion  500 mL Intravenous Continuous Pyrtle,  Lajuan Lines, MD         Patient Active Problem List   Diagnosis Date Noted  . Unilateral primary osteoarthritis, right knee 08/24/2016  . Hypertriglyceridemia without hypercholesterolemia 09/03/2013  . CHEST PAIN-PRECORDIAL 08/31/2009  . CHEST PAIN, ATYPICAL 08/28/2009   Past Medical History:  Diagnosis Date  . Allergy    Seasonal  . Arthritis    back  . GERD (gastroesophageal reflux disease)  in past    Family History  Problem Relation Age of Onset  . Parkinson's disease Mother   . Brain cancer Father   . Breast cancer Neg Hx     Past Surgical History:  Procedure Laterality Date  . CERVICAL DISC SURGERY     over 10 years  . COLONOSCOPY     Social History   Occupational History  . Not on file  Tobacco Use  . Smoking status: Never Smoker  . Smokeless tobacco: Never Used  Substance and Sexual Activity  . Alcohol use: No  . Drug use: No  . Sexual activity: Not on file

## 2017-10-31 DIAGNOSIS — L820 Inflamed seborrheic keratosis: Secondary | ICD-10-CM | POA: Diagnosis not present

## 2017-11-07 ENCOUNTER — Ambulatory Visit
Admission: RE | Admit: 2017-11-07 | Discharge: 2017-11-07 | Disposition: A | Discharge status: Home / Self Care | Payer: PPO | Source: Ambulatory Visit | Attending: Orthopedic Surgery | Admitting: Orthopedic Surgery

## 2017-11-07 DIAGNOSIS — M5136 Other intervertebral disc degeneration, lumbar region: Secondary | ICD-10-CM

## 2017-11-07 DIAGNOSIS — M545 Low back pain: Secondary | ICD-10-CM | POA: Diagnosis not present

## 2017-11-13 ENCOUNTER — Ambulatory Visit (INDEPENDENT_AMBULATORY_CARE_PROVIDER_SITE_OTHER): Payer: PPO | Admitting: Orthopaedic Surgery

## 2017-12-05 DIAGNOSIS — Z6822 Body mass index (BMI) 22.0-22.9, adult: Secondary | ICD-10-CM | POA: Diagnosis not present

## 2017-12-05 DIAGNOSIS — Z01419 Encounter for gynecological examination (general) (routine) without abnormal findings: Secondary | ICD-10-CM | POA: Diagnosis not present

## 2017-12-15 ENCOUNTER — Ambulatory Visit (INDEPENDENT_AMBULATORY_CARE_PROVIDER_SITE_OTHER): Payer: PPO

## 2017-12-15 ENCOUNTER — Other Ambulatory Visit: Payer: PPO

## 2017-12-15 DIAGNOSIS — E781 Pure hyperglyceridemia: Secondary | ICD-10-CM

## 2017-12-15 DIAGNOSIS — Z23 Encounter for immunization: Secondary | ICD-10-CM

## 2017-12-15 DIAGNOSIS — R5382 Chronic fatigue, unspecified: Secondary | ICD-10-CM | POA: Diagnosis not present

## 2017-12-15 LAB — COMPREHENSIVE METABOLIC PANEL
AG Ratio: 1.9 (calc) (ref 1.0–2.5)
ALT: 10 U/L (ref 6–29)
AST: 17 U/L (ref 10–35)
Albumin: 4.2 g/dL (ref 3.6–5.1)
Alkaline phosphatase (APISO): 29 U/L — ABNORMAL LOW (ref 33–130)
BUN: 22 mg/dL (ref 7–25)
CO2: 27 mmol/L (ref 20–32)
Calcium: 9.5 mg/dL (ref 8.6–10.4)
Chloride: 106 mmol/L (ref 98–110)
Creat: 0.9 mg/dL (ref 0.60–0.93)
Globulin: 2.2 g/dL (calc) (ref 1.9–3.7)
Glucose, Bld: 83 mg/dL (ref 65–99)
Potassium: 4.5 mmol/L (ref 3.5–5.3)
Sodium: 139 mmol/L (ref 135–146)
Total Bilirubin: 0.4 mg/dL (ref 0.2–1.2)
Total Protein: 6.4 g/dL (ref 6.1–8.1)

## 2017-12-15 LAB — CBC WITH DIFFERENTIAL/PLATELET
Basophils Absolute: 29 cells/uL (ref 0–200)
Basophils Relative: 0.6 %
Eosinophils Absolute: 158 cells/uL (ref 15–500)
Eosinophils Relative: 3.3 %
HCT: 37.2 % (ref 35.0–45.0)
Hemoglobin: 12.5 g/dL (ref 11.7–15.5)
Lymphs Abs: 2146 cells/uL (ref 850–3900)
MCH: 31 pg (ref 27.0–33.0)
MCHC: 33.6 g/dL (ref 32.0–36.0)
MCV: 92.3 fL (ref 80.0–100.0)
MPV: 9.8 fL (ref 7.5–12.5)
Monocytes Relative: 10.1 %
Neutro Abs: 1982 cells/uL (ref 1500–7800)
Neutrophils Relative %: 41.3 %
Platelets: 266 10*3/uL (ref 140–400)
RBC: 4.03 10*6/uL (ref 3.80–5.10)
RDW: 12.3 % (ref 11.0–15.0)
Total Lymphocyte: 44.7 %
WBC mixed population: 485 cells/uL (ref 200–950)
WBC: 4.8 10*3/uL (ref 3.8–10.8)

## 2017-12-15 LAB — LIPID PANEL
Cholesterol: 198 mg/dL (ref <200)
HDL: 85 mg/dL (ref >50)
LDL Cholesterol (Calc): 98 mg/dL (calc)
Non-HDL Cholesterol (Calc): 113 mg/dL (calc) (ref <130)
Total CHOL/HDL Ratio: 2.3 (calc) (ref <5.0)
Triglycerides: 68 mg/dL (ref <150)

## 2017-12-15 NOTE — Progress Notes (Signed)
Patient came in today and received her annual flu vaccine. Patient was given the fluzone High dose for 65 years and older. Patient tolerated well. VIS given

## 2017-12-21 ENCOUNTER — Ambulatory Visit: Payer: PPO | Admitting: Family Medicine

## 2018-01-18 ENCOUNTER — Ambulatory Visit (INDEPENDENT_AMBULATORY_CARE_PROVIDER_SITE_OTHER): Payer: PPO | Admitting: Orthopaedic Surgery

## 2018-01-18 ENCOUNTER — Ambulatory Visit (INDEPENDENT_AMBULATORY_CARE_PROVIDER_SITE_OTHER): Payer: PPO

## 2018-01-18 ENCOUNTER — Encounter (INDEPENDENT_AMBULATORY_CARE_PROVIDER_SITE_OTHER): Payer: Self-pay | Admitting: Orthopaedic Surgery

## 2018-01-18 VITALS — BP 128/66 | HR 72 | Ht 63.0 in | Wt 125.0 lb

## 2018-01-18 DIAGNOSIS — M25512 Pain in left shoulder: Secondary | ICD-10-CM | POA: Diagnosis not present

## 2018-01-18 DIAGNOSIS — G8929 Other chronic pain: Secondary | ICD-10-CM

## 2018-01-18 MED ORDER — BUPIVACAINE HCL 0.5 % IJ SOLN
2.0000 mL | INTRAMUSCULAR | Status: AC | PRN
  Administered 2018-01-18: 2 mL via INTRA_ARTICULAR

## 2018-01-18 MED ORDER — LIDOCAINE HCL 2 % IJ SOLN
2.0000 mL | INTRAMUSCULAR | Status: AC | PRN
  Administered 2018-01-18: 2 mL

## 2018-01-18 MED ORDER — METHYLPREDNISOLONE ACETATE 40 MG/ML IJ SUSP
80.0000 mg | INTRAMUSCULAR | Status: AC | PRN
  Administered 2018-01-18: 80 mg

## 2018-01-18 NOTE — Progress Notes (Signed)
Office Visit Note   [name]           Date of Birth: 22-Apr-1945           MRN: [id number] Visit Date: 01/18/2018              Requested by: [name], MD [address] [place], [place] [address] PCP: [name], MD   Assessment & Plan: Visit Diagnoses:  1. Chronic left shoulder pain     Plan: Impingement syndrome left shoulder.  We will try a subacromial cortisone injection and monitor response.  Follow-Up Instructions: Return if symptoms worsen or fail to improve.   Orders:  Orders Placed This Encounter  Procedures  . Large Joint Inj: L subacromial bursa  . XR Shoulder Left   No orders of the defined types were placed in this encounter.     Procedures: Large Joint Inj: L subacromial bursa on 01/18/2018 11:32 AM Indications: pain and diagnostic evaluation Details: 25 G 1.5 in needle, anterolateral approach  Arthrogram: No  Medications: 2 mL lidocaine 2 %; 2 mL bupivacaine 0.5 %; 80 mg methylPREDNISolone acetate 40 MG/ML Consent was given by the patient. Immediately prior to procedure a time out was called to verify the correct patient, procedure, equipment, support staff and site/side marked as required. Patient was prepped and draped in the usual sterile fashion.       Clinical Data: No additional findings.   Subjective: Chief Complaint  Patient presents with  . Follow-up    L SHOULDER PAIN FOR 1 WEEK NO INJURY  Taylor Taylor developed insidious onset of left shoulder pain last week.  No history of injury or trauma.  Pain has been somewhat localized along the lateral deltoid muscle particularly with shoulder motion.  No skin changes.  No neck problems.  No numbness or tingling  HPI  Review of Systems  Constitutional: Negative for fatigue and fever.  HENT: Negative for ear pain.   Eyes: Negative for pain.  Respiratory: Negative for cough and shortness of breath.   Cardiovascular: Negative for leg swelling.    Gastrointestinal: Negative for constipation and diarrhea.  Genitourinary: Negative for difficulty urinating.  Musculoskeletal: Negative for back pain and neck pain.  Skin: Negative for rash.  Allergic/Immunologic: Negative for food allergies.  Neurological: Positive for weakness. Negative for numbness.  Hematological: Does not bruise/bleed easily.  Psychiatric/Behavioral: Negative for sleep disturbance.     Objective: Vital Signs: BP 128/66 (BP Location: Right Arm, Patient Position: Sitting, Cuff Size: Normal)   Pulse 72   Ht 5\' 3"  (1.6 m)   Wt 125 lb (56.7 kg)   BMI 22.14 kg/m   Physical Exam  Constitutional: She is oriented to person, place, and time. She appears well-developed and well-nourished.  HENT:  Mouth/Throat: Oropharynx is clear and moist.  Eyes: Pupils are equal, round, and reactive to light. EOM are normal.  Pulmonary/Chest: Effort normal.  Neurological: She is alert and oriented to person, place, and time.  Skin: Skin is warm and dry.  Psychiatric: She has a normal mood and affect. Her behavior is normal.    Ortho Exam awake alert and oriented x3.  Comfortable sitting.  Able to quickly place left arm overhead and even to the middle of her back.  Some pain with internal and external rotation in the impingement position Referred to lateral deltoid muscle.  Skin intact.  Biceps intact.  No localized tenderness over the acromioclavicular joint.  Good strength Specialty  Comments:  No specialty comments available.  Imaging: Xr Shoulder Left  Result Date: 01/18/2018 Films of the left shoulder obtained in several projections.  There is no ectopic calcification.  Humeral head is centered about the glenoid.  Normal space between the humeral head and the acromium.  Mild degenerative changes of the acromioclavicular joint.  Acute changes.    PMFS History: Patient Active Problem List   Diagnosis Date Noted  . Unilateral primary osteoarthritis, right knee 08/24/2016   . Hypertriglyceridemia without hypercholesterolemia 09/03/2013  . CHEST PAIN-PRECORDIAL 08/31/2009  . CHEST PAIN, ATYPICAL 08/28/2009   Past Medical History:  Diagnosis Date  . Allergy    Seasonal  . Arthritis    back  . GERD (gastroesophageal reflux disease)    in past    Family History  Problem Relation Age of Onset  . Parkinson's disease Mother   . Brain cancer Father   . Breast cancer Neg Hx     Past Surgical History:  Procedure Laterality Date  . CERVICAL DISC SURGERY     over 10 years  . COLONOSCOPY     Social History   Occupational History  . Not on file  Tobacco Use  . Smoking status: Never Smoker  . Smokeless tobacco: Never Used  Substance and Sexual Activity  . Alcohol use: No  . Drug use: No  . Sexual activity: Not on file

## 2018-01-30 ENCOUNTER — Other Ambulatory Visit: Payer: Self-pay | Admitting: Family Medicine

## 2018-02-01 ENCOUNTER — Ambulatory Visit (INDEPENDENT_AMBULATORY_CARE_PROVIDER_SITE_OTHER): Payer: PPO | Admitting: Orthopaedic Surgery

## 2018-02-21 ENCOUNTER — Encounter: Payer: Self-pay | Admitting: Family Medicine

## 2018-02-21 ENCOUNTER — Ambulatory Visit (INDEPENDENT_AMBULATORY_CARE_PROVIDER_SITE_OTHER): Payer: PPO | Admitting: Family Medicine

## 2018-02-21 VITALS — BP 116/64 | HR 70 | Temp 98.1°F | Resp 16 | Ht 64.0 in | Wt 129.1 lb

## 2018-02-21 DIAGNOSIS — H9193 Unspecified hearing loss, bilateral: Secondary | ICD-10-CM | POA: Diagnosis not present

## 2018-02-21 DIAGNOSIS — H6122 Impacted cerumen, left ear: Secondary | ICD-10-CM

## 2018-02-21 NOTE — Progress Notes (Signed)
Patient ID: Taylor Taylor, female    DOB: 01/23/1946, 72 y.o.   MRN: [id number]  PCP: [name], MD  Chief Complaint  Patient presents with  . Ear congestion    onset 2 weeks ago.     Subjective:   Taylor Taylor is a 72 y.o. female, presents to clinic with CC of went on vacation 2 weeks ago they feel blocked, thinks she got water in them while swimming. She has no pain no discharge.  Hearing is decreased bilaterally.  She denies any ear drainage, external ear swelling redness or tenderness.  She "keeps nasal congestion and allergies" but denies any recent worsening of symptoms with weather changes or with recent travel by airplane.  She has had cerumen impaction in the past and it feels somewhat similar to that.  She denies any vertigo, headaches, sore throat, coughing, fever.   Patient Active Problem List   Diagnosis Date Noted  . Unilateral primary osteoarthritis, right knee 08/24/2016  . Hypertriglyceridemia without hypercholesterolemia 09/03/2013  . CHEST PAIN-PRECORDIAL 08/31/2009  . CHEST PAIN, ATYPICAL 08/28/2009     Prior to Admission medications   Medication Sig Start Date End Date Taking? Authorizing Provider  acetaminophen (TYLENOL) 500 MG tablet Take 500 mg by mouth 2 (two) times daily.   Yes [provider]  calcium carbonate (OS-CAL) 600 MG TABS tablet Take 600 mg by mouth 2 (two) times daily with a meal.   Yes [provider]  COD LIVER OIL PO Take by mouth daily.   Yes [provider]  diclofenac (VOLTAREN) 75 MG EC tablet TAKE 1 TABLET BY MOUTH TWICE A DAY 01/30/18  Yes [name], MD  estradiol (ESTRACE) 0.5 MG tablet  01/31/16  Yes [provider]  ferrous sulfate 325 (65 FE) MG tablet Take 325 mg by mouth daily with breakfast.   Yes [provider]  Fish Oil-Cholecalciferol (FISH OIL + D3) 1200-1000 MG-UNIT CAPS Take 1 tablet by mouth daily.   Yes [provider]  Glucosamine-Chondroit-Vit  C-Mn (GLUCOSAMINE CHONDR 1500 COMPLX) CAPS Take 1 capsule by mouth 2 (two) times daily.   Yes [provider]  Melatonin 10 MG CAPS Take by mouth.   Yes [provider]  methylPREDNISolone (MEDROL DOSEPAK) 4 MG TBPK tablet As directed 10/26/17  Yes Petrarca, Mike Craze, PA-C  Multiple Vitamins-Minerals (WOMENS MULTI VITAMIN & MINERAL PO) Take 1 tablet by mouth daily.   Yes [provider]  progesterone (PROMETRIUM) 100 MG capsule Take 1 capsule (100 mg total) by mouth daily. 08/06/15  Yes [name], MD  Red Yeast Rice 600 MG CAPS Take by mouth daily.   Yes [provider]  Turmeric 500 MG TABS Take by mouth daily.   Yes [provider]  ketoconazole (NIZORAL) 2 % cream Apply 1 fingertip amount to all toenails daily. Patient not taking: Reported on 10/26/2017 03/30/17   Evelina Bucy, DPM     Allergies  Allergen Reactions  . Pollen Extract      Family History  Problem Relation Age of Onset  . Parkinson's disease Mother   . Brain cancer Father   . Breast cancer Neg Hx      Social History   Socioeconomic History  . Marital status: Married    Spouse name: Not on file  . Number of children: Not on file  . Years of education: Not on file  . Highest education level: Not on file  Occupational History  .  Not on file  Social Needs  . Financial resource strain: Not on file  . Food insecurity:    Worry: Not on file    Inability: Not on file  . Transportation needs:    Medical: Not on file    Non-medical: Not on file  Tobacco Use  . Smoking status: Never Smoker  . Smokeless tobacco: Never Used  Substance and Sexual Activity  . Alcohol use: No  . Drug use: No  . Sexual activity: Not on file  Lifestyle  . Physical activity:    Days per week: Not on file    Minutes per session: Not on file  . Stress: Not on file  Relationships  . Social connections:    Talks on phone: Not on file    Gets together: Not on file    Attends  religious service: Not on file    Active member of club or organization: Not on file    Attends meetings of clubs or organizations: Not on file    Relationship status: Not on file  . Intimate partner violence:    Fear of current or ex partner: Not on file    Emotionally abused: Not on file    Physically abused: Not on file    Forced sexual activity: Not on file  Other Topics Concern  . Not on file  Social History Narrative  . Not on file     Review of Systems  Constitutional: Negative.  Negative for activity change, appetite change, chills, diaphoresis, fatigue and fever.  HENT: Negative.   Eyes: Negative.  Negative for pain, discharge, redness and itching.  Respiratory: Negative.   Cardiovascular: Negative.   Gastrointestinal: Negative.   Endocrine: Negative.   Genitourinary: Negative.   Musculoskeletal: Negative.   Skin: Negative.   Allergic/Immunologic: Positive for environmental allergies. Negative for food allergies and immunocompromised state.  Neurological: Negative.  Negative for dizziness, tremors, syncope, facial asymmetry, weakness, light-headedness and headaches.  Hematological: Negative.  Negative for adenopathy.  Psychiatric/Behavioral: Negative.   All other systems reviewed and are negative.      Objective:    Vitals:   02/21/18 1127  BP: 116/64  Pulse: 70  Resp: 16  Temp: 98.1 F (36.7 C)  TempSrc: Oral  SpO2: 98%  Weight: 129 lb 2 oz (58.6 kg)  Height: 5\' 4"  (1.626 m)      Physical Exam  Constitutional: She appears well-developed and well-nourished.  Non-toxic appearance. She does not have a sickly appearance. She does not appear ill. No distress.  HENT:  Head: Normocephalic and atraumatic.  Right Ear: Tympanic membrane, external ear and ear canal normal. No drainage, swelling or tenderness. No mastoid tenderness. No middle ear effusion. Decreased hearing is noted.  Left Ear: Tympanic membrane, external ear and ear canal normal. No drainage,  swelling or tenderness. No mastoid tenderness. Decreased hearing is noted.  Nose: Mucosal edema and rhinorrhea present. Right sinus exhibits no maxillary sinus tenderness and no frontal sinus tenderness. Left sinus exhibits no maxillary sinus tenderness and no frontal sinus tenderness.  Mouth/Throat: Uvula is midline and mucous membranes are normal. Mucous membranes are not pale. No uvula swelling. No oropharyngeal exudate or tonsillar abscesses.  Normal-appearing cerumen in right external auditory canal that is nonobstructing, visualized portion of tympanic membrane is normal in appearance, transparent without effusion or erythema, all landmarks of right TM are visible  Left external auditory canal impacted with soft appearing light brown cerumen, no visible canal erythema or edema, no tenderness on  exam with manipulation of pinna or palpation of tragus no drainage  Bilateral decreased hearing  Eyes: Pupils are equal, round, and reactive to light. Conjunctivae are normal. Right eye exhibits no discharge. Left eye exhibits no discharge.  Neck: Normal range of motion. Neck supple. No tracheal deviation present.  Cardiovascular: Normal rate, regular rhythm, normal heart sounds and intact distal pulses.  Pulmonary/Chest: Effort normal. No stridor. No respiratory distress. She has no wheezes. She has no rhonchi. She has no rales.  Abdominal: Soft. Bowel sounds are normal. She exhibits no distension.  Musculoskeletal: Normal range of motion.  Neurological: She is alert. She exhibits normal muscle tone. Coordination normal.  Skin: Skin is warm and dry. No rash noted. She is not diaphoretic. No pallor.  Psychiatric: She has a normal mood and affect. Her behavior is normal.  Nursing note and vitals reviewed.   Procedure: Cerumen Disimpaction  Gentle ear lavage with warm water and hydrogen peroxide performed on the left. There were no complications and following the disimpaction the tympanic membrane was  visible on the left.  Tympanic membrane intact following the procedure.   Auditory canals and TM mildly injected. Pt tolerated procedure, reported improvement of symptoms after removal of cerumen.  Hearing improved to left       Assessment & Plan:      ICD-10-CM   1. Decreased hearing of both ears H91.93   2. Impacted cerumen of left ear H61.22     Cerumen disimpacted in clinic, TM intact, pt tolerated w/o SE  Decreased hearing may be from Eustachian tube dysfunction, pt notes hx of chronic nasal congestion and allergies, tx with OTC steroid nasal spray.   F/up as needed.   No signs of infection.    Delsa Grana, PA-C 02/21/18 1:08 PM

## 2018-02-21 NOTE — Patient Instructions (Signed)
Can use an over the counter steroid nasal spray to try and treat some of the ear symptoms (congestion, decreased hearing) - use flonase, nasonex or generic equivalent.     Eustachian Tube Dysfunction The eustachian tube connects the middle ear to the back of the nose. It regulates air pressure in the middle ear by allowing air to move between the ear and nose. It also helps to drain fluid from the middle ear space. When the eustachian tube does not function properly, air pressure, fluid, or both can build up in the middle ear. Eustachian tube dysfunction can affect one or both ears. What are the causes? This condition happens when the eustachian tube becomes blocked or cannot open normally. This may result from:  Ear infections.  Colds and other upper respiratory infections.  Allergies.  Irritation, such as from cigarette smoke or acid from the stomach coming up into the esophagus (gastroesophageal reflux).  Sudden changes in air pressure, such as from descending in an airplane.  Abnormal growths in the nose or throat, such as nasal polyps, tumors, or enlarged tissue at the back of the throat (adenoids).  What increases the risk? This condition may be more likely to develop in people who smoke and people who are overweight. Eustachian tube dysfunction may also be more likely to develop in children, especially children who have:  Certain birth defects of the mouth, such as cleft palate.  Large tonsils and adenoids.  What are the signs or symptoms? Symptoms of this condition may include:  A feeling of fullness in the ear.  Ear pain.  Clicking or popping noises in the ear.  Ringing in the ear.  Hearing loss.  Loss of balance.  Symptoms may get worse when the air pressure around you changes, such as when you travel to an area of high elevation or fly on an airplane. How is this diagnosed? This condition may be diagnosed based on:  Your symptoms.  A physical exam of your  ear, nose, and throat.  Tests, such as those that measure: ? The movement of your eardrum (tympanogram). ? Your hearing (audiometry).  How is this treated? Treatment depends on the cause and severity of your condition. If your symptoms are mild, you may be able to relieve your symptoms by moving air into ("popping") your ears. If you have symptoms of fluid in your ears, treatment may include:  Decongestants.  Antihistamines.  Nasal sprays or ear drops that contain medicines that reduce swelling (steroids).  In some cases, you may need to have a procedure to drain the fluid in your eardrum (myringotomy). In this procedure, a small tube is placed in the eardrum to:  Drain the fluid.  Restore the air in the middle ear space.  Follow these instructions at home:  Take over-the-counter and prescription medicines only as told by your health care provider.  Use techniques to help pop your ears as recommended by your health care provider. These may include: ? Chewing gum. ? Yawning. ? Frequent, forceful swallowing. ? Closing your mouth, holding your nose closed, and gently blowing as if you are trying to blow air out of your nose.  Do not do any of the following until your health care provider approves: ? Travel to high altitudes. ? Fly in airplanes. ? Work in a Pension scheme manager or room. ? Scuba dive.  Keep your ears dry. Dry your ears completely after showering or bathing.  Do not smoke.  Keep all follow-up visits as told by  your health care provider. This is important. Contact a health care provider if:  Your symptoms do not go away after treatment.  Your symptoms come back after treatment.  You are unable to pop your ears.  You have: ? A fever. ? Pain in your ear. ? Pain in your head or neck. ? Fluid draining from your ear.  Your hearing suddenly changes.  You become very dizzy.  You lose your balance. This information is not intended to replace advice given to  you by your health care provider. Make sure you discuss any questions you have with your health care provider. Document Released: 04/03/2015 Document Revised: 08/13/2015 Document Reviewed: 03/26/2014 Elsevier Interactive Patient Education  2018 [place], Adult The ears produce a substance called earwax that helps keep bacteria out of the ear and protects the skin in the ear canal. Occasionally, earwax can build up in the ear and cause discomfort or hearing loss. What increases the risk? This condition is more likely to develop in people who:  Are female.  Are elderly.  Naturally produce more earwax.  Clean their ears often with cotton swabs.  Use earplugs often.  Use in-ear headphones often.  Wear hearing aids.  Have narrow ear canals.  Have earwax that is overly thick or sticky.  Have eczema.  Are dehydrated.  Have excess hair in the ear canal.  What are the signs or symptoms? Symptoms of this condition include:  Reduced or muffled hearing.  A feeling of fullness in the ear or feeling that the ear is plugged.  Fluid coming from the ear.  Ear pain.  Ear itch.  Ringing in the ear.  Coughing.  An obvious piece of earwax that can be seen inside the ear canal.  How is this diagnosed? This condition may be diagnosed based on:  Your symptoms.  Your medical history.  An ear exam. During the exam, your health care provider will look into your ear with an instrument called an otoscope.  You may have tests, including a hearing test. How is this treated? This condition may be treated by:  Using ear drops to soften the earwax.  Having the earwax removed by a health care provider. The health care provider may: ? Flush the ear with water. ? Use an instrument that has a loop on the end (curette). ? Use a suction device.  Surgery to remove the wax buildup. This may be done in severe cases.  Follow these instructions at home:  Take  over-the-counter and prescription medicines only as told by your health care provider.  Do not put any objects, including cotton swabs, into your ear. You can clean the opening of your ear canal with a washcloth or facial tissue.  Follow instructions from your health care provider about cleaning your ears. Do not over-clean your ears.  Drink enough fluid to keep your urine clear or pale yellow. This will help to thin the earwax.  Keep all follow-up visits as told by your health care provider. If earwax builds up in your ears often or if you use hearing aids, consider seeing your health care provider for routine, preventive ear cleanings. Ask your health care provider how often you should schedule your cleanings.  If you have hearing aids, clean them according to instructions from the manufacturer and your health care provider. Contact a health care provider if:  You have ear pain.  You develop a fever.  You have blood, pus, or other fluid  coming from your ear.  You have hearing loss.  You have ringing in your ears that does not go away.  Your symptoms do not improve with treatment.  You feel like the room is spinning (vertigo). Summary  Earwax can build up in the ear and cause discomfort or hearing loss.  The most common symptoms of this condition include reduced or muffled hearing and a feeling of fullness in the ear or feeling that the ear is plugged.  This condition may be diagnosed based on your symptoms, your medical history, and an ear exam.  This condition may be treated by using ear drops to soften the earwax or by having the earwax removed by a health care provider.  Do not put any objects, including cotton swabs, into your ear. You can clean the opening of your ear canal with a washcloth or facial tissue. This information is not intended to replace advice given to you by your health care provider. Make sure you discuss any questions you have with your health care  provider. Document Released: 04/14/2004 Document Revised: 05/18/2016 Document Reviewed: 05/18/2016 Elsevier Interactive Patient Education  [employer].

## 2018-03-23 DIAGNOSIS — R300 Dysuria: Secondary | ICD-10-CM | POA: Diagnosis not present

## 2018-04-30 DIAGNOSIS — H16223 Keratoconjunctivitis sicca, not specified as Sjogren's, bilateral: Secondary | ICD-10-CM | POA: Diagnosis not present

## 2018-05-18 ENCOUNTER — Ambulatory Visit (INDEPENDENT_AMBULATORY_CARE_PROVIDER_SITE_OTHER): Payer: PPO | Admitting: Orthopaedic Surgery

## 2018-05-18 ENCOUNTER — Encounter (INDEPENDENT_AMBULATORY_CARE_PROVIDER_SITE_OTHER): Payer: Self-pay | Admitting: Orthopaedic Surgery

## 2018-05-18 ENCOUNTER — Ambulatory Visit (INDEPENDENT_AMBULATORY_CARE_PROVIDER_SITE_OTHER): Payer: PPO

## 2018-05-18 VITALS — BP 104/64 | HR 62 | Ht 63.0 in | Wt 125.0 lb

## 2018-05-18 DIAGNOSIS — M180 Bilateral primary osteoarthritis of first carpometacarpal joints: Secondary | ICD-10-CM

## 2018-05-18 DIAGNOSIS — M79641 Pain in right hand: Secondary | ICD-10-CM | POA: Insufficient documentation

## 2018-05-18 DIAGNOSIS — M19041 Primary osteoarthritis, right hand: Secondary | ICD-10-CM

## 2018-05-18 DIAGNOSIS — M19042 Primary osteoarthritis, left hand: Secondary | ICD-10-CM | POA: Diagnosis not present

## 2018-05-18 DIAGNOSIS — M79642 Pain in left hand: Secondary | ICD-10-CM | POA: Diagnosis not present

## 2018-05-18 HISTORY — DX: Pain in right hand: M79.641

## 2018-05-18 HISTORY — DX: Pain in left hand: M79.642

## 2018-05-18 MED ORDER — METHYLPREDNISOLONE ACETATE 40 MG/ML IJ SUSP
20.0000 mg | INTRAMUSCULAR | Status: AC | PRN
  Administered 2018-05-18: 20 mg via INTRA_ARTICULAR

## 2018-05-18 MED ORDER — LIDOCAINE HCL 1 % IJ SOLN
1.0000 mL | INTRAMUSCULAR | Status: AC | PRN
  Administered 2018-05-18: 1 mL

## 2018-05-18 NOTE — Progress Notes (Signed)
Office Visit Note   [name]           Date of Birth: Jan 10, 1946           MRN: [id number] Visit Date: 05/18/2018              Requested by: [name], MD [address] [place], [place] [address] PCP: [name], MD   Assessment & Plan: Visit Diagnoses:  1. Pain in right hand   2. Pain in left hand     Plan: Primary osteoarthritis base of both thumbs at the metacarpal carpal joint.  Will inject both.  Long discussion regarding diagnosis treatment options including bracing surgery.  Will monitor response  Follow-Up Instructions: Return if symptoms worsen or fail to improve.   Orders:  Orders Placed This Encounter  Procedures  . Small Joint Inj: R thumb CMC  . Small Joint Inj: L thumb CMC  . XR Hand 2 View Right  . XR Hand 2 View Left   No orders of the defined types were placed in this encounter.     Procedures: Small Joint Inj: R thumb CMC on 05/18/2018 11:08 AM Details: 27 G needle, dorsal approach Medications: 1 mL lidocaine 1 %; 20 mg methylPREDNISolone acetate 40 MG/ML  Small Joint Inj: L thumb CMC on 05/18/2018 11:08 AM Details: 27 G needle, dorsal approach Medications: 1 mL lidocaine 1 %; 20 mg methylPREDNISolone acetate 40 MG/ML      Clinical Data: No additional findings.   Subjective: Chief Complaint  Patient presents with  . Right Hand - Pain  . Left Hand - Pain  Patient presents today with bilateral proximal thumb pain. She said both sides are equally painful, but the right side swells more.She said that the pain is not constant, but definitely more painful when she touches them. She is wanting to get cortisone injections today. She is not diabetic.  Has noted weakness of both of her hands related to the thumb osteoarthritis.  No numbness or tingling  HPI  Review of Systems   Objective: Vital Signs: BP 104/64   Pulse 62   Ht 5\' 3"  (1.6 m)   Wt 125 lb (56.7 kg)   BMI 22.14 kg/m   Physical  Exam Constitutional:      Appearance: She is well-developed.  Eyes:     Pupils: Pupils are equal, round, and reactive to light.  Pulmonary:     Effort: Pulmonary effort is normal.  Skin:    General: Skin is warm and dry.  Neurological:     Mental Status: She is alert and oriented to person, place, and time.  Psychiatric:        Behavior: Behavior normal.     Ortho Exam examination of both hands reveals some degenerative changes with Heberden nodes at the DIP joints diffusely.  Normal sensation.  No Tinel's over the median nerve and good sensation of the hand.  There are hypertrophic changes to the base of both thumbs at the metacarpal carpal joint with subluxation more on the right dominant hand than the left.  Positive grind test bilaterally at the thumbs  Specialty Comments:  No specialty comments available.  Imaging: Xr Hand 2 View Left  Result Date: 05/18/2018 Films of the left hand were obtained in several projections.  Similar to the right there are degenerative changes at the base of the thumb at the carpometacarpal joint.  There are subchondral sclerosis changes on both sides with  narrowing of the joint space.  There is not as much subluxation is on the right.  No acute changes  Xr Hand 2 View Right  Result Date: 05/18/2018 Films of the right hand were obtained in several projections.  There is significant degenerative change at the base of the thumb at the metacarpal carpal joint.  There is subluxation and significant decrease in the joint space with subchondral sclerosis on both sides.  Films are consistent with advanced arthritis of the right thumb.  No acute changes.  There are some degenerative changes at the DIP joints diffusely.    PMFS History: Patient Active Problem List   Diagnosis Date Noted  . Pain in right hand 05/18/2018  . Pain in left hand 05/18/2018  . Unilateral primary osteoarthritis, right knee 08/24/2016  . Hypertriglyceridemia without  hypercholesterolemia 09/03/2013  . CHEST PAIN-PRECORDIAL 08/31/2009  . CHEST PAIN, ATYPICAL 08/28/2009   Past Medical History:  Diagnosis Date  . Allergy    Seasonal  . Arthritis    back  . GERD (gastroesophageal reflux disease)    in past    Family History  Problem Relation Age of Onset  . Parkinson's disease Mother   . Brain cancer Father   . Breast cancer Neg Hx     Past Surgical History:  Procedure Laterality Date  . CERVICAL DISC SURGERY     over 10 years  . COLONOSCOPY     Social History   Occupational History  . Not on file  Tobacco Use  . Smoking status: Never Smoker  . Smokeless tobacco: Never Used  Substance and Sexual Activity  . Alcohol use: No  . Drug use: No  . Sexual activity: Not on file

## 2018-05-25 ENCOUNTER — Telehealth: Payer: Self-pay | Admitting: Family Medicine

## 2018-05-25 DIAGNOSIS — Z1231 Encounter for screening mammogram for malignant neoplasm of breast: Secondary | ICD-10-CM

## 2018-05-25 DIAGNOSIS — Z9189 Other specified personal risk factors, not elsewhere classified: Secondary | ICD-10-CM

## 2018-05-25 NOTE — Telephone Encounter (Signed)
Pt needs bone density ordered as well as mammogram she will call and schedule it herself.

## 2018-05-29 ENCOUNTER — Other Ambulatory Visit: Payer: Self-pay | Admitting: Family Medicine

## 2018-05-29 DIAGNOSIS — Z780 Asymptomatic menopausal state: Secondary | ICD-10-CM

## 2018-05-29 NOTE — Telephone Encounter (Signed)
Orders placed.

## 2018-06-04 DIAGNOSIS — D1801 Hemangioma of skin and subcutaneous tissue: Secondary | ICD-10-CM | POA: Diagnosis not present

## 2018-06-04 DIAGNOSIS — L814 Other melanin hyperpigmentation: Secondary | ICD-10-CM | POA: Diagnosis not present

## 2018-06-04 DIAGNOSIS — D225 Melanocytic nevi of trunk: Secondary | ICD-10-CM | POA: Diagnosis not present

## 2018-06-04 DIAGNOSIS — L821 Other seborrheic keratosis: Secondary | ICD-10-CM | POA: Diagnosis not present

## 2018-07-19 ENCOUNTER — Ambulatory Visit: Payer: PPO

## 2018-07-26 ENCOUNTER — Other Ambulatory Visit: Payer: PPO

## 2018-08-02 ENCOUNTER — Ambulatory Visit: Payer: PPO | Admitting: Family Medicine

## 2018-08-30 ENCOUNTER — Ambulatory Visit: Payer: PPO | Admitting: Orthopaedic Surgery

## 2018-09-20 ENCOUNTER — Other Ambulatory Visit: Payer: Self-pay

## 2018-09-20 ENCOUNTER — Ambulatory Visit
Admission: RE | Admit: 2018-09-20 | Discharge: 2018-09-20 | Disposition: A | Discharge status: Home / Self Care | Payer: PPO | Source: Ambulatory Visit | Attending: Family Medicine | Admitting: Family Medicine

## 2018-09-20 DIAGNOSIS — Z1231 Encounter for screening mammogram for malignant neoplasm of breast: Secondary | ICD-10-CM | POA: Diagnosis not present

## 2018-09-20 DIAGNOSIS — Z780 Asymptomatic menopausal state: Secondary | ICD-10-CM | POA: Diagnosis not present

## 2018-09-20 DIAGNOSIS — Z13820 Encounter for screening for osteoporosis: Secondary | ICD-10-CM | POA: Diagnosis not present

## 2018-10-04 ENCOUNTER — Ambulatory Visit (INDEPENDENT_AMBULATORY_CARE_PROVIDER_SITE_OTHER): Payer: PPO | Admitting: Family Medicine

## 2018-10-04 ENCOUNTER — Encounter: Payer: Self-pay | Admitting: Family Medicine

## 2018-10-04 ENCOUNTER — Other Ambulatory Visit: Payer: Self-pay

## 2018-10-04 VITALS — BP 130/76 | HR 68 | Temp 98.6°F | Resp 16 | Ht 64.0 in | Wt 126.0 lb

## 2018-10-04 DIAGNOSIS — R260 Ataxic gait: Secondary | ICD-10-CM | POA: Diagnosis not present

## 2018-10-04 DIAGNOSIS — R42 Dizziness and giddiness: Secondary | ICD-10-CM

## 2018-10-04 DIAGNOSIS — G3281 Cerebellar ataxia in diseases classified elsewhere: Secondary | ICD-10-CM | POA: Diagnosis not present

## 2018-10-04 NOTE — Progress Notes (Signed)
Subjective:    Patient ID: Taylor Taylor, female    DOB: April 23, 1945, 73 y.o.   MRN: [id number]  HPI Patient is a very pleasant 73 year old Caucasian female here for unusual sensation in the head.  She states that over the last 6 months she has had episodes where she will come extremely dizzy.  She will have to pause for a moment until the symptoms pass.  This Monday it occurred 3 times.  Each time it lasted less than 10 minutes.  There is no spinning of the room.  It is a lightheadedness and a feeling of disequilibrium.  Occasionally she will stagger when walking and she feels unsteady on her feet.  She denies any true vertigo.  She denies any hearing loss.  She denies any tinnitus.  She denies any orthostatic hypotension.  She denies any syncope or presyncope.  She denies any other neurologic deficit.  Cranial nerves II through XII are grossly intact with muscle strength 5/5 equal and symmetric in the upper and lower extremities.  Romberg test is normal.  However on heel-to-toe test, the patient staggers and falls into the wall twice while walking and had to catch herself her balance.  This is grossly abnormal for this patient.  Her Dix-Hallpike maneuver was negative on both sides Past Medical History:  Diagnosis Date  . Allergy    Seasonal  . Arthritis    back  . GERD (gastroesophageal reflux disease)    in past   Past Surgical History:  Procedure Laterality Date  . CERVICAL DISC SURGERY     over 10 years  . COLONOSCOPY     Current Outpatient Medications on File Prior to Visit  Medication Sig Dispense Refill  . acetaminophen (TYLENOL) 500 MG tablet Take 500 mg by mouth 2 (two) times daily.    . calcium carbonate (OS-CAL) 600 MG TABS tablet Take 600 mg by mouth 2 (two) times daily with a meal.    . COD LIVER OIL PO Take by mouth daily.    . diclofenac (VOLTAREN) 75 MG EC tablet TAKE 1 TABLET BY MOUTH TWICE A DAY 180 tablet 1  . estradiol (ESTRACE) 0.5 MG tablet     . ferrous sulfate  325 (65 FE) MG tablet Take 325 mg by mouth daily with breakfast.    . Fish Oil-Cholecalciferol (FISH OIL + D3) 1200-1000 MG-UNIT CAPS Take 1 tablet by mouth daily.    . Glucosamine-Chondroit-Vit C-Mn (GLUCOSAMINE CHONDR 1500 COMPLX) CAPS Take 1 capsule by mouth 2 (two) times daily.    . Melatonin 10 MG CAPS Take by mouth.    . Multiple Vitamins-Minerals (WOMENS MULTI VITAMIN & MINERAL PO) Take 1 tablet by mouth daily.    . progesterone (PROMETRIUM) 100 MG capsule Take 1 capsule (100 mg total) by mouth daily. 90 capsule 3  . Red Yeast Rice 600 MG CAPS Take by mouth daily.    . Turmeric 500 MG TABS Take by mouth daily.     Current Facility-Administered Medications on File Prior to Visit  Medication Dose Route Frequency Provider Last Rate Last Dose  . 0.9 %  sodium chloride infusion  500 mL Intravenous Continuous Pyrtle, Lajuan Lines, MD       Allergies  Allergen Reactions  . Pollen Extract    Social History   Socioeconomic History  . Marital status: Married    Spouse name: Not on file  . Number of children: Not on file  . Years of education: Not on file  .  Highest education level: Not on file  Occupational History  . Not on file  Social Needs  . Financial resource strain: Not on file  . Food insecurity    Worry: Not on file    Inability: Not on file  . Transportation needs    Medical: Not on file    Non-medical: Not on file  Tobacco Use  . Smoking status: Never Smoker  . Smokeless tobacco: Never Used  Substance and Sexual Activity  . Alcohol use: No  . Drug use: No  . Sexual activity: Not on file  Lifestyle  . Physical activity    Days per week: Not on file    Minutes per session: Not on file  . Stress: Not on file  Relationships  . Social Herbalist on phone: Not on file    Gets together: Not on file    Attends religious service: Not on file    Active member of club or organization: Not on file    Attends meetings of clubs or organizations: Not on file     Relationship status: Not on file  . Intimate partner violence    Fear of current or ex partner: Not on file    Emotionally abused: Not on file    Physically abused: Not on file    Forced sexual activity: Not on file  Other Topics Concern  . Not on file  Social History Narrative  . Not on file    Past Medical History:  Diagnosis Date  . Allergy    Seasonal  . Arthritis    back  . GERD (gastroesophageal reflux disease)    in past   Past Surgical History:  Procedure Laterality Date  . CERVICAL DISC SURGERY     over 10 years  . COLONOSCOPY     Current Outpatient Medications on File Prior to Visit  Medication Sig Dispense Refill  . acetaminophen (TYLENOL) 500 MG tablet Take 500 mg by mouth 2 (two) times daily.    . calcium carbonate (OS-CAL) 600 MG TABS tablet Take 600 mg by mouth 2 (two) times daily with a meal.    . COD LIVER OIL PO Take by mouth daily.    . diclofenac (VOLTAREN) 75 MG EC tablet TAKE 1 TABLET BY MOUTH TWICE A DAY 180 tablet 1  . estradiol (ESTRACE) 0.5 MG tablet     . ferrous sulfate 325 (65 FE) MG tablet Take 325 mg by mouth daily with breakfast.    . Fish Oil-Cholecalciferol (FISH OIL + D3) 1200-1000 MG-UNIT CAPS Take 1 tablet by mouth daily.    . Glucosamine-Chondroit-Vit C-Mn (GLUCOSAMINE CHONDR 1500 COMPLX) CAPS Take 1 capsule by mouth 2 (two) times daily.    . Melatonin 10 MG CAPS Take by mouth.    . Multiple Vitamins-Minerals (WOMENS MULTI VITAMIN & MINERAL PO) Take 1 tablet by mouth daily.    . progesterone (PROMETRIUM) 100 MG capsule Take 1 capsule (100 mg total) by mouth daily. 90 capsule 3  . Red Yeast Rice 600 MG CAPS Take by mouth daily.    . Turmeric 500 MG TABS Take by mouth daily.     Current Facility-Administered Medications on File Prior to Visit  Medication Dose Route Frequency Provider Last Rate Last Dose  . 0.9 %  sodium chloride infusion  500 mL Intravenous Continuous Pyrtle, Lajuan Lines, MD       Allergies  Allergen Reactions  . Pollen  Extract    Social History  Socioeconomic History  . Marital status: Married    Spouse name: Not on file  . Number of children: Not on file  . Years of education: Not on file  . Highest education level: Not on file  Occupational History  . Not on file  Social Needs  . Financial resource strain: Not on file  . Food insecurity    Worry: Not on file    Inability: Not on file  . Transportation needs    Medical: Not on file    Non-medical: Not on file  Tobacco Use  . Smoking status: Never Smoker  . Smokeless tobacco: Never Used  Substance and Sexual Activity  . Alcohol use: No  . Drug use: No  . Sexual activity: Not on file  Lifestyle  . Physical activity    Days per week: Not on file    Minutes per session: Not on file  . Stress: Not on file  Relationships  . Social Herbalist on phone: Not on file    Gets together: Not on file    Attends religious service: Not on file    Active member of club or organization: Not on file    Attends meetings of clubs or organizations: Not on file    Relationship status: Not on file  . Intimate partner violence    Fear of current or ex partner: Not on file    Emotionally abused: Not on file    Physically abused: Not on file    Forced sexual activity: Not on file  Other Topics Concern  . Not on file  Social History Narrative  . Not on file      Review of Systems  All other systems reviewed and are negative.      Objective:   Physical Exam  Constitutional: She is oriented to person, place, and time. She appears well-developed and well-nourished. No distress.  HENT:  Head: Normocephalic and atraumatic.  Right Ear: External ear normal.  Left Ear: External ear normal.  Nose: Nose normal.  Mouth/Throat: Oropharynx is clear and moist. No oropharyngeal exudate.  Eyes: Pupils are equal, round, and reactive to light. Conjunctivae and EOM are normal.  Neck: Neck supple. No JVD present. No tracheal deviation present. No  thyromegaly present.  Cardiovascular: Normal rate, regular rhythm and normal heart sounds. Exam reveals no gallop and no friction rub.  No murmur heard. Pulmonary/Chest: Effort normal and breath sounds normal. No respiratory distress. She has no wheezes. She has no rales. She exhibits no tenderness.  Abdominal: Soft. Bowel sounds are normal. She exhibits no distension and no mass. There is no abdominal tenderness. There is no rebound and no guarding.  Musculoskeletal: Normal range of motion.        General: No tenderness.  Lymphadenopathy:    She has no cervical adenopathy.  Neurological: She is alert and oriented to person, place, and time. She displays normal reflexes. No cranial nerve deficit. She exhibits normal muscle tone. Coordination abnormal.  Skin: No rash noted. She is not diaphoretic.  Vitals reviewed.         Assessment & Plan:  The primary encounter diagnosis was Lightheadedness. Diagnoses of Staggering gait and Cerebellar ataxia in diseases classified elsewhere [facility]) were also pertinent to this visit. History is uncertain.  I believe most likely patient may be having episodes of mild vertigo however her exam today significant only for some mild ataxia on heel-to-toe walking in the hallway.  I will arrange an MRI  to evaluate the cerebellum further.  Patient denies any syncope or near syncope.

## 2018-10-05 ENCOUNTER — Encounter: Payer: Self-pay | Admitting: Family Medicine

## 2018-10-31 ENCOUNTER — Other Ambulatory Visit: Payer: Self-pay

## 2018-10-31 ENCOUNTER — Ambulatory Visit
Admission: RE | Admit: 2018-10-31 | Discharge: 2018-10-31 | Disposition: A | Discharge status: Home / Self Care | Payer: PPO | Source: Ambulatory Visit | Attending: Family Medicine | Admitting: Family Medicine

## 2018-10-31 DIAGNOSIS — R270 Ataxia, unspecified: Secondary | ICD-10-CM | POA: Diagnosis not present

## 2018-10-31 DIAGNOSIS — G3281 Cerebellar ataxia in diseases classified elsewhere: Secondary | ICD-10-CM

## 2018-10-31 DIAGNOSIS — R42 Dizziness and giddiness: Secondary | ICD-10-CM | POA: Diagnosis not present

## 2018-10-31 DIAGNOSIS — R260 Ataxic gait: Secondary | ICD-10-CM

## 2018-10-31 MED ORDER — GADOBENATE DIMEGLUMINE 529 MG/ML IV SOLN
10.0000 mL | Freq: Once | INTRAVENOUS | Status: AC | PRN
  Administered 2018-10-31: 10 mL via INTRAVENOUS

## 2018-11-01 ENCOUNTER — Encounter: Payer: Self-pay | Admitting: Family Medicine

## 2018-11-03 ENCOUNTER — Other Ambulatory Visit: Payer: Self-pay | Admitting: Family Medicine

## 2018-11-05 ENCOUNTER — Encounter: Payer: Self-pay | Admitting: Family Medicine

## 2018-11-05 ENCOUNTER — Ambulatory Visit (INDEPENDENT_AMBULATORY_CARE_PROVIDER_SITE_OTHER): Payer: PPO | Admitting: Family Medicine

## 2018-11-05 ENCOUNTER — Ambulatory Visit: Payer: PPO | Admitting: Family Medicine

## 2018-11-05 VITALS — BP 126/58 | HR 83 | Temp 99.1°F | Resp 16 | Ht 64.0 in | Wt 125.0 lb

## 2018-11-05 DIAGNOSIS — R260 Ataxic gait: Secondary | ICD-10-CM

## 2018-11-05 DIAGNOSIS — R531 Weakness: Secondary | ICD-10-CM | POA: Diagnosis not present

## 2018-11-05 NOTE — Progress Notes (Signed)
Subjective:    Patient ID: Taylor Taylor, female    DOB: Dec 10, 1945, 73 y.o.   MRN: [id number]  HPI  10/04/18 Patient is a very pleasant 73 year old Caucasian female here for unusual sensation in the head.  She states that over the last 6 months she has had episodes where she will come extremely dizzy.  She will have to pause for a moment until the symptoms pass.  This Monday it occurred 3 times.  Each time it lasted less than 10 minutes.  There is no spinning of the room.  It is a lightheadedness and a feeling of disequilibrium.  Occasionally she will stagger when walking and she feels unsteady on her feet.  She denies any true vertigo.  She denies any hearing loss.  She denies any tinnitus.  She denies any orthostatic hypotension.  She denies any syncope or presyncope.  She denies any other neurologic deficit.  Cranial nerves II through XII are grossly intact with muscle strength 5/5 equal and symmetric in the upper and lower extremities.  Romberg test is normal.  However on heel-to-toe test, the patient staggers and falls into the wall twice while walking and had to catch herself her balance.  This is grossly abnormal for this patient.  Her Dix-Hallpike maneuver was negative on both sides.  At that time, my plan was: History is uncertain.  I believe most likely patient may be having episodes of mild vertigo however her exam today significant only for some mild ataxia on heel-to-toe walking in the hallway.  I will arrange an MRI to evaluate the cerebellum further.  Patient denies any syncope or near syncope.  11/05/18 MRI Brain: Cerebral volume is within normal limits for age. No restricted diffusion to suggest acute infarction. No midline shift, mass effect, evidence of mass lesion, ventriculomegaly, extra-axial collection or acute intracranial hemorrhage. Cervicomedullary junction and pituitary are within normal limits.  Pearline Cables and white matter signal is within normal limits for age throughout  the brain. No cortical encephalomalacia or definite chronic blood products. The deep gray nuclei, brainstem and cerebellum are normal for age.  No abnormal enhancement identified.  No dural thickening.  Vascular: Major intracranial vascular flow voids are preserved. Mild intracranial artery tortuosity. The left vertebral artery appears dominant. The major dural venous sinuses are enhancing and appear to be patent.  Skull and upper cervical spine: Negative visible cervical spine. Visualized bone marrow signal is within normal limits.  Sinuses/Orbits: Normal orbits.  Paranasal sinuses are clear.  Other: Mastoids are clear aside from trace fluid on the left which appears inconsequential (series 3, image 6). The other visible bilateral internal auditory structures appear normal. Normal stylomastoid foramina. Scalp and face soft tissues appear negative.  Patient is relieved that her MRI is normal however she states that she occasionally still feels like she drifts to the left when she walks.  She denies falling in the walls.  She denies tripping over objects.  However she states that she does feel more clumsy than normal.  She questions if she may be hypervigilant and making something out of nothing.  She denies dropping objects.  She denies knocking over stationary objects.  She denies falling.  She denies tripping going up or down steps.  She denies muscle weakness.  She denies muscle spasticity.  She denies trouble breathing.  She denies diplopia.  She denies muscle fatigue.  She denies loss of coordination.  She denies trouble swallowing.  She denies numbness or tingling in her extremities.  She denies weakness in her extremities.  Cranial nerves II through XII are grossly intact today.  Muscle strength is 5/5 equal and symmetric in the upper and lower extremities.  She has normal 2/4 reflexes checked at the biceps, brachioradialis, triceps, patella, and the Achilles.  Romberg testing is  normal.  Finger-to-nose testing is normal.  Heel-to-shin testing is normal.  Patient has normal gait.  There is no evidence of ataxia in the hallway.  She is able to walk heel-to-toe without any difficulty.  There is no contralateral hip drop while standing on one leg at a time. Past Medical History:  Diagnosis Date  . Allergy    Seasonal  . Arthritis    back  . GERD (gastroesophageal reflux disease)    in past   Past Surgical History:  Procedure Laterality Date  . CERVICAL DISC SURGERY     over 10 years  . COLONOSCOPY     Current Outpatient Medications on File Prior to Visit  Medication Sig Dispense Refill  . acetaminophen (TYLENOL) 500 MG tablet Take 500 mg by mouth 2 (two) times daily.    . calcium carbonate (OS-CAL) 600 MG TABS tablet Take 600 mg by mouth 2 (two) times daily with a meal.    . COD LIVER OIL PO Take by mouth daily.    . diclofenac (VOLTAREN) 75 MG EC tablet TAKE 1 TABLET BY MOUTH TWICE A DAY 180 tablet 1  . estradiol (ESTRACE) 0.5 MG tablet     . ferrous sulfate 325 (65 FE) MG tablet Take 325 mg by mouth daily with breakfast.    . Fish Oil-Cholecalciferol (FISH OIL + D3) 1200-1000 MG-UNIT CAPS Take 1 tablet by mouth daily.    . Glucosamine-Chondroit-Vit C-Mn (GLUCOSAMINE CHONDR 1500 COMPLX) CAPS Take 1 capsule by mouth 2 (two) times daily.    . Melatonin 10 MG CAPS Take by mouth.    . Multiple Vitamins-Minerals (WOMENS MULTI VITAMIN & MINERAL PO) Take 1 tablet by mouth daily.    . progesterone (PROMETRIUM) 100 MG capsule Take 1 capsule (100 mg total) by mouth daily. 90 capsule 3  . Red Yeast Rice 600 MG CAPS Take by mouth daily.    . Turmeric 500 MG TABS Take by mouth daily.     Current Facility-Administered Medications on File Prior to Visit  Medication Dose Route Frequency Provider Last Rate Last Dose  . 0.9 %  sodium chloride infusion  500 mL Intravenous Continuous Pyrtle, Lajuan Lines, MD       Allergies  Allergen Reactions  . Pollen Extract    Social History    Socioeconomic History  . Marital status: Married    Spouse name: Not on file  . Number of children: Not on file  . Years of education: Not on file  . Highest education level: Not on file  Occupational History  . Not on file  Social Needs  . Financial resource strain: Not on file  . Food insecurity    Worry: Not on file    Inability: Not on file  . Transportation needs    Medical: Not on file    Non-medical: Not on file  Tobacco Use  . Smoking status: Never Smoker  . Smokeless tobacco: Never Used  Substance and Sexual Activity  . Alcohol use: No  . Drug use: No  . Sexual activity: Not on file  Lifestyle  . Physical activity    Days per week: Not on file    Minutes per session: Not  on file  . Stress: Not on file  Relationships  . Social Herbalist on phone: Not on file    Gets together: Not on file    Attends religious service: Not on file    Active member of club or organization: Not on file    Attends meetings of clubs or organizations: Not on file    Relationship status: Not on file  . Intimate partner violence    Fear of current or ex partner: Not on file    Emotionally abused: Not on file    Physically abused: Not on file    Forced sexual activity: Not on file  Other Topics Concern  . Not on file  Social History Narrative  . Not on file    Past Medical History:  Diagnosis Date  . Allergy    Seasonal  . Arthritis    back  . GERD (gastroesophageal reflux disease)    in past   Past Surgical History:  Procedure Laterality Date  . CERVICAL DISC SURGERY     over 10 years  . COLONOSCOPY     Current Outpatient Medications on File Prior to Visit  Medication Sig Dispense Refill  . acetaminophen (TYLENOL) 500 MG tablet Take 500 mg by mouth 2 (two) times daily.    . calcium carbonate (OS-CAL) 600 MG TABS tablet Take 600 mg by mouth 2 (two) times daily with a meal.    . COD LIVER OIL PO Take by mouth daily.    . diclofenac (VOLTAREN) 75 MG EC  tablet TAKE 1 TABLET BY MOUTH TWICE A DAY 180 tablet 1  . estradiol (ESTRACE) 0.5 MG tablet     . ferrous sulfate 325 (65 FE) MG tablet Take 325 mg by mouth daily with breakfast.    . Fish Oil-Cholecalciferol (FISH OIL + D3) 1200-1000 MG-UNIT CAPS Take 1 tablet by mouth daily.    . Glucosamine-Chondroit-Vit C-Mn (GLUCOSAMINE CHONDR 1500 COMPLX) CAPS Take 1 capsule by mouth 2 (two) times daily.    . Melatonin 10 MG CAPS Take by mouth.    . Multiple Vitamins-Minerals (WOMENS MULTI VITAMIN & MINERAL PO) Take 1 tablet by mouth daily.    . progesterone (PROMETRIUM) 100 MG capsule Take 1 capsule (100 mg total) by mouth daily. 90 capsule 3  . Red Yeast Rice 600 MG CAPS Take by mouth daily.    . Turmeric 500 MG TABS Take by mouth daily.     Current Facility-Administered Medications on File Prior to Visit  Medication Dose Route Frequency Provider Last Rate Last Dose  . 0.9 %  sodium chloride infusion  500 mL Intravenous Continuous Pyrtle, Lajuan Lines, MD       Allergies  Allergen Reactions  . Pollen Extract    Social History   Socioeconomic History  . Marital status: Married    Spouse name: Not on file  . Number of children: Not on file  . Years of education: Not on file  . Highest education level: Not on file  Occupational History  . Not on file  Social Needs  . Financial resource strain: Not on file  . Food insecurity    Worry: Not on file    Inability: Not on file  . Transportation needs    Medical: Not on file    Non-medical: Not on file  Tobacco Use  . Smoking status: Never Smoker  . Smokeless tobacco: Never Used  Substance and Sexual Activity  . Alcohol use: No  .  Drug use: No  . Sexual activity: Not on file  Lifestyle  . Physical activity    Days per week: Not on file    Minutes per session: Not on file  . Stress: Not on file  Relationships  . Social Herbalist on phone: Not on file    Gets together: Not on file    Attends religious service: Not on file     Active member of club or organization: Not on file    Attends meetings of clubs or organizations: Not on file    Relationship status: Not on file  . Intimate partner violence    Fear of current or ex partner: Not on file    Emotionally abused: Not on file    Physically abused: Not on file    Forced sexual activity: Not on file  Other Topics Concern  . Not on file  Social History Narrative  . Not on file      Review of Systems  All other systems reviewed and are negative.      Objective:   Physical Exam  Constitutional: She is oriented to person, place, and time. She appears well-developed and well-nourished. No distress.  HENT:  Head: Normocephalic and atraumatic.  Right Ear: External ear normal.  Left Ear: External ear normal.  Nose: Nose normal.  Mouth/Throat: Oropharynx is clear and moist. No oropharyngeal exudate.  Eyes: Pupils are equal, round, and reactive to light. Conjunctivae and EOM are normal.  Neck: Neck supple. No JVD present. No tracheal deviation present. No thyromegaly present.  Cardiovascular: Normal rate, regular rhythm and normal heart sounds. Exam reveals no gallop and no friction rub.  No murmur heard. Pulmonary/Chest: Effort normal and breath sounds normal. No respiratory distress. She has no wheezes. She has no rales. She exhibits no tenderness.  Abdominal: Soft. Bowel sounds are normal. She exhibits no distension and no mass. There is no abdominal tenderness. There is no rebound and no guarding.  Musculoskeletal: Normal range of motion.        General: No tenderness.  Lymphadenopathy:    She has no cervical adenopathy.  Neurological: She is alert and oriented to person, place, and time. She displays normal reflexes. No cranial nerve deficit. She exhibits normal muscle tone. Coordination normal.  Skin: No rash noted. She is not diaphoretic.  Vitals reviewed.         Assessment & Plan:  The primary encounter diagnosis was Weakness. A diagnosis  of Staggering gait was also pertinent to this visit.  Patient's physical exam today is completely normal.  I can find no abnormalities on her neurologic exam.  MRI is normal.  I see no evidence of cerebellar ataxia.  There are no upper motor neuron findings.  There are no lower motor neuron findings.  I see no evidence of ALS.  Next that would be second opinion through neurology and possible EMG/nerve conduction studies.  We discussed this at length and the patient is comfortable clinically monitoring the situation at the present time and if the situation deteriorates then proceed with neurology consultation.  Overall she states that she is feeling well.  She denies any heavy metal exposure.  I will check a TSH and a B12 to look for any other potential causes of muscle weakness and lack of coordination that may be chemical in nature.

## 2018-11-06 ENCOUNTER — Encounter: Payer: Self-pay | Admitting: Family Medicine

## 2018-11-06 LAB — CBC WITH DIFFERENTIAL/PLATELET
Absolute Monocytes: 592 cells/uL (ref 200–950)
Basophils Absolute: 52 cells/uL (ref 0–200)
Basophils Relative: 0.8 %
Eosinophils Absolute: 319 cells/uL (ref 15–500)
Eosinophils Relative: 4.9 %
HCT: 37.1 % (ref 35.0–45.0)
Hemoglobin: 12.4 g/dL (ref 11.7–15.5)
Lymphs Abs: 3023 cells/uL (ref 850–3900)
MCH: 31.7 pg (ref 27.0–33.0)
MCHC: 33.4 g/dL (ref 32.0–36.0)
MCV: 94.9 fL (ref 80.0–100.0)
MPV: 9.9 fL (ref 7.5–12.5)
Monocytes Relative: 9.1 %
Neutro Abs: 2516 cells/uL (ref 1500–7800)
Neutrophils Relative %: 38.7 %
Platelets: 297 10*3/uL (ref 140–400)
RBC: 3.91 10*6/uL (ref 3.80–5.10)
RDW: 12.2 % (ref 11.0–15.0)
Total Lymphocyte: 46.5 %
WBC: 6.5 10*3/uL (ref 3.8–10.8)

## 2018-11-06 LAB — COMPLETE METABOLIC PANEL WITH GFR
AG Ratio: 1.8 (calc) (ref 1.0–2.5)
ALT: 10 U/L (ref 6–29)
AST: 12 U/L (ref 10–35)
Albumin: 4.2 g/dL (ref 3.6–5.1)
Alkaline phosphatase (APISO): 30 U/L — ABNORMAL LOW (ref 37–153)
BUN/Creatinine Ratio: 28 (calc) — ABNORMAL HIGH (ref 6–22)
BUN: 26 mg/dL — ABNORMAL HIGH (ref 7–25)
CO2: 32 mmol/L (ref 20–32)
Calcium: 9.6 mg/dL (ref 8.6–10.4)
Chloride: 104 mmol/L (ref 98–110)
Creat: 0.93 mg/dL (ref 0.60–0.93)
GFR, Est African American: 71 mL/min/{1.73_m2} (ref >=60)
GFR, Est Non African American: 61 mL/min/{1.73_m2} (ref >=60)
Globulin: 2.4 g/dL (calc) (ref 1.9–3.7)
Glucose, Bld: 90 mg/dL (ref 65–99)
Potassium: 4.4 mmol/L (ref 3.5–5.3)
Sodium: 140 mmol/L (ref 135–146)
Total Bilirubin: 0.3 mg/dL (ref 0.2–1.2)
Total Protein: 6.6 g/dL (ref 6.1–8.1)

## 2018-11-06 LAB — TSH: TSH: 1.38 mIU/L (ref 0.40–4.50)

## 2018-11-06 LAB — VITAMIN B12: Vitamin B-12: 714 pg/mL (ref 200–1100)

## 2018-12-06 ENCOUNTER — Other Ambulatory Visit: Payer: Self-pay

## 2018-12-06 ENCOUNTER — Ambulatory Visit (INDEPENDENT_AMBULATORY_CARE_PROVIDER_SITE_OTHER): Payer: PPO | Admitting: *Deleted

## 2018-12-06 DIAGNOSIS — Z23 Encounter for immunization: Secondary | ICD-10-CM | POA: Diagnosis not present

## 2018-12-06 NOTE — Progress Notes (Signed)
Patient seen in office for Influenza Vaccination.   Tolerated IM administration well.   Immunization history updated.  

## 2018-12-07 DIAGNOSIS — Z124 Encounter for screening for malignant neoplasm of cervix: Secondary | ICD-10-CM | POA: Diagnosis not present

## 2018-12-07 DIAGNOSIS — Z01419 Encounter for gynecological examination (general) (routine) without abnormal findings: Secondary | ICD-10-CM | POA: Diagnosis not present

## 2018-12-11 ENCOUNTER — Ambulatory Visit (INDEPENDENT_AMBULATORY_CARE_PROVIDER_SITE_OTHER): Payer: PPO

## 2018-12-11 ENCOUNTER — Ambulatory Visit: Payer: PPO | Admitting: Orthopaedic Surgery

## 2018-12-11 ENCOUNTER — Other Ambulatory Visit: Payer: Self-pay

## 2018-12-11 ENCOUNTER — Encounter: Payer: Self-pay | Admitting: Orthopaedic Surgery

## 2018-12-11 VITALS — BP 128/67 | HR 78 | Ht 63.0 in | Wt 125.0 lb

## 2018-12-11 DIAGNOSIS — M25511 Pain in right shoulder: Secondary | ICD-10-CM

## 2018-12-11 DIAGNOSIS — G8929 Other chronic pain: Secondary | ICD-10-CM

## 2018-12-11 DIAGNOSIS — M899 Disorder of bone, unspecified: Secondary | ICD-10-CM

## 2018-12-11 HISTORY — DX: Pain in right shoulder: M25.511

## 2018-12-11 MED ORDER — METHYLPREDNISOLONE ACETATE 40 MG/ML IJ SUSP
40.0000 mg | INTRAMUSCULAR | Status: AC | PRN
  Administered 2018-12-11: 40 mg via INTRAMUSCULAR

## 2018-12-11 MED ORDER — LIDOCAINE HCL 1 % IJ SOLN
1.0000 mL | INTRAMUSCULAR | Status: AC | PRN
  Administered 2018-12-11: 1 mL

## 2018-12-11 NOTE — Addendum Note (Signed)
Addended by: Lendon Collar on: 12/11/2018 09:24 AM   Modules accepted: Orders

## 2018-12-11 NOTE — Progress Notes (Signed)
Office Visit Note   [name]           Date of Birth: 1946/01/21           MRN: [id number] Visit Date: 12/11/2018              Requested by: [name], MD [address] [place],  [place] [address] PCP: [name], MD   Assessment & Plan: Visit Diagnoses:  1. Chronic right shoulder pain     Plan: Area of trigger point tenderness along the vertebral border of the right scapula.  Will inject with cortisone.  No impingement syndrome.  Does have a bulbous lesion of the right fourth rib.  Appears to be benign but will CT scan as patient has no history of rib fracture.  This might represent a benign tumor  Follow-Up Instructions: Return After CT scan.   Orders:  Orders Placed This Encounter  Procedures  . Trigger Point Inj  . XR Shoulder Right   No orders of the defined types were placed in this encounter.     Procedures: Trigger Point Inj  Date/Time: 12/11/2018 8:57 AM Performed by: Garald Balding, MD Authorized by: Garald Balding, MD   Consent Given by:  Patient Indications:  Pain Total # of Trigger Points:  1 Location: shoulder   Needle Size:  27 G Approach:  Dorsal Medications #1:  1 mL lidocaine 1 %; 40 mg methylPREDNISolone acetate 40 MG/ML Comments: Area of trigger point tenderness along the vertebral border of the right scapula injected.  Immediate relief of pain     Clinical Data: No additional findings.   Subjective: Chief Complaint  Patient presents with  . Right Shoulder - Pain  Patient present today for right shoulder pain. She said that it has ached for months. No known injury. Her pain is located posteriorly. No difficulty with range of motion, or weakness. She is right hand dominant. No previous shoulder surgery. She is taking tylenol as needed. Her pain seems worse in the morning.  Pain is localized posteriorly along the vertebral border of the scapula.  Not having any difficulty with raising her right arm  over her head.  HPI  Review of Systems  Constitutional: Negative for fatigue.  HENT: Negative for ear pain.   Eyes: Negative for pain.  Respiratory: Negative for shortness of breath.   Cardiovascular: Negative for leg swelling.  Gastrointestinal: Negative for constipation and diarrhea.  Endocrine: Negative for cold intolerance and heat intolerance.  Genitourinary: Negative for difficulty urinating.  Musculoskeletal: Negative for joint swelling.  Skin: Negative for rash.  Allergic/Immunologic: Negative for food allergies.  Neurological: Negative for weakness.  Hematological: Does not bruise/bleed easily.  Psychiatric/Behavioral: Negative for sleep disturbance.     Objective: Vital Signs: BP 128/67   Pulse 78   Ht 5\' 3"  (1.6 m)   Wt 125 lb (56.7 kg)   BMI 22.14 kg/m   Physical Exam Constitutional:      Appearance: She is well-developed.  Eyes:     Pupils: Pupils are equal, round, and reactive to light.  Pulmonary:     Effort: Pulmonary effort is normal.  Skin:    General: Skin is warm and dry.  Neurological:     Mental Status: She is alert and oriented to person, place, and time.  Psychiatric:        Behavior: Behavior normal.     Ortho Exam negative impingement right shoulder.  No localized areas  of tenderness except along the vertebral border of the scapula.  No masses.  Good strength.  No pain along the anterior lateral subacromial region.  Biceps intact.  Did have some tenderness along the rib cage in the axilla  Specialty Comments:  No specialty comments available.  Imaging: Xr Shoulder Right  Result Date: 12/11/2018 Films of the right shoulder obtained in several projections.  Humeral head is centered about the glenoid.  A small area of ectopic calcification about the greater trochanter was identified on 1 of the views.  Mild degenerative changes of the acromioclavicular joint without significant narrowing of the joint space but with subchondral cysts on both  sides.  No acute changes.  There is a bulbous enlargement of the right fourth rib.  No history of fracture.  Was a little tender on exam.  Will obtain CT scan.  Appears benign    PMFS History: Patient Active Problem List   Diagnosis Date Noted  . Pain in right shoulder 12/11/2018  . Pain in right hand 05/18/2018  . Pain in left hand 05/18/2018  . Unilateral primary osteoarthritis, right knee 08/24/2016  . Hypertriglyceridemia without hypercholesterolemia 09/03/2013  . CHEST PAIN-PRECORDIAL 08/31/2009  . CHEST PAIN, ATYPICAL 08/28/2009   Past Medical History:  Diagnosis Date  . Allergy    Seasonal  . Arthritis    back  . GERD (gastroesophageal reflux disease)    in past    Family History  Problem Relation Age of Onset  . Parkinson's disease Mother   . Brain cancer Father   . Breast cancer Neg Hx     Past Surgical History:  Procedure Laterality Date  . CERVICAL DISC SURGERY     over 10 years  . COLONOSCOPY     Social History   Occupational History  . Not on file  Tobacco Use  . Smoking status: Never Smoker  . Smokeless tobacco: Never Used  Substance and Sexual Activity  . Alcohol use: No  . Drug use: No  . Sexual activity: Not on file

## 2018-12-13 ENCOUNTER — Ambulatory Visit: Payer: PPO | Admitting: Orthopaedic Surgery

## 2018-12-24 ENCOUNTER — Emergency Department (HOSPITAL_COMMUNITY): Payer: PPO

## 2018-12-24 ENCOUNTER — Other Ambulatory Visit: Payer: Self-pay

## 2018-12-24 ENCOUNTER — Encounter (HOSPITAL_COMMUNITY): Payer: Self-pay | Admitting: *Deleted

## 2018-12-24 ENCOUNTER — Emergency Department (HOSPITAL_COMMUNITY)
Admission: EM | Admit: 2018-12-24 | Discharge: 2018-12-24 | Disposition: A | Discharge status: Home / Self Care | Payer: PPO | Attending: Emergency Medicine | Admitting: Emergency Medicine

## 2018-12-24 DIAGNOSIS — R0789 Other chest pain: Secondary | ICD-10-CM | POA: Diagnosis not present

## 2018-12-24 DIAGNOSIS — X58XXXA Exposure to other specified factors, initial encounter: Secondary | ICD-10-CM | POA: Diagnosis not present

## 2018-12-24 DIAGNOSIS — Y929 Unspecified place or not applicable: Secondary | ICD-10-CM | POA: Diagnosis not present

## 2018-12-24 DIAGNOSIS — D329 Benign neoplasm of meninges, unspecified: Secondary | ICD-10-CM | POA: Diagnosis not present

## 2018-12-24 DIAGNOSIS — R911 Solitary pulmonary nodule: Secondary | ICD-10-CM

## 2018-12-24 DIAGNOSIS — R079 Chest pain, unspecified: Secondary | ICD-10-CM | POA: Diagnosis not present

## 2018-12-24 DIAGNOSIS — Z79899 Other long term (current) drug therapy: Secondary | ICD-10-CM | POA: Diagnosis not present

## 2018-12-24 DIAGNOSIS — Y939 Activity, unspecified: Secondary | ICD-10-CM | POA: Insufficient documentation

## 2018-12-24 DIAGNOSIS — S161XXA Strain of muscle, fascia and tendon at neck level, initial encounter: Secondary | ICD-10-CM

## 2018-12-24 DIAGNOSIS — M542 Cervicalgia: Secondary | ICD-10-CM | POA: Diagnosis present

## 2018-12-24 DIAGNOSIS — D320 Benign neoplasm of cerebral meninges: Secondary | ICD-10-CM | POA: Diagnosis not present

## 2018-12-24 DIAGNOSIS — Y999 Unspecified external cause status: Secondary | ICD-10-CM | POA: Insufficient documentation

## 2018-12-24 DIAGNOSIS — I7774 Dissection of vertebral artery: Secondary | ICD-10-CM | POA: Diagnosis not present

## 2018-12-24 LAB — CBC
HCT: 41.5 % (ref 36.0–46.0)
Hemoglobin: 13.8 g/dL (ref 12.0–15.0)
MCH: 31.5 pg (ref 26.0–34.0)
MCHC: 33.3 g/dL (ref 30.0–36.0)
MCV: 94.7 fL (ref 80.0–100.0)
Platelets: 287 10*3/uL (ref 150–400)
RBC: 4.38 MIL/uL (ref 3.87–5.11)
RDW: 12.6 % (ref 11.5–15.5)
WBC: 5.5 10*3/uL (ref 4.0–10.5)
nRBC: 0 % (ref 0.0–0.2)

## 2018-12-24 LAB — BASIC METABOLIC PANEL
Anion gap: 10 (ref 5–15)
BUN: 25 mg/dL — ABNORMAL HIGH (ref 8–23)
CO2: 25 mmol/L (ref 22–32)
Calcium: 9.5 mg/dL (ref 8.9–10.3)
Chloride: 103 mmol/L (ref 98–111)
Creatinine, Ser: 0.99 mg/dL (ref 0.44–1.00)
GFR calc Af Amer: >60 mL/min (ref >60)
GFR calc non Af Amer: 57 mL/min — ABNORMAL LOW (ref >60)
Glucose, Bld: 103 mg/dL — ABNORMAL HIGH (ref 70–99)
Potassium: 4.1 mmol/L (ref 3.5–5.1)
Sodium: 138 mmol/L (ref 135–145)

## 2018-12-24 LAB — TROPONIN I (HIGH SENSITIVITY)
Troponin I (High Sensitivity): 3 ng/L (ref <18)
Troponin I (High Sensitivity): 2 ng/L (ref <18)

## 2018-12-24 MED ORDER — SODIUM CHLORIDE 0.9% FLUSH
3.0000 mL | Freq: Once | INTRAVENOUS | Status: AC
  Administered 2018-12-24: 3 mL via INTRAVENOUS

## 2018-12-24 MED ORDER — CYCLOBENZAPRINE HCL 5 MG PO TABS: 5.0000 mg | ORAL_TABLET | Freq: Three times a day (TID) | ORAL | 0 refills | Status: DC | PRN

## 2018-12-24 MED ORDER — LIDOCAINE 5 % EX PTCH
1.0000 | MEDICATED_PATCH | CUTANEOUS | Status: DC
  Administered 2018-12-24: 1 via TRANSDERMAL
  Filled 2018-12-24: qty 1

## 2018-12-24 MED ORDER — CYCLOBENZAPRINE HCL 10 MG PO TABS
5.0000 mg | ORAL_TABLET | Freq: Once | ORAL | Status: AC
  Administered 2018-12-24: 5 mg via ORAL
  Filled 2018-12-24: qty 1

## 2018-12-24 MED ORDER — LIDOCAINE 5 % EX PTCH: 1.0000 | MEDICATED_PATCH | CUTANEOUS | 0 refills | Status: DC

## 2018-12-24 MED ORDER — MORPHINE SULFATE (PF) 4 MG/ML IV SOLN
4.0000 mg | Freq: Once | INTRAVENOUS | Status: AC
  Administered 2018-12-24: 4 mg via INTRAVENOUS
  Filled 2018-12-24: qty 1

## 2018-12-24 MED ORDER — IOHEXOL 350 MG/ML SOLN
50.0000 mL | Freq: Once | INTRAVENOUS | Status: AC | PRN
  Administered 2018-12-24: 50 mL via INTRAVENOUS

## 2018-12-24 MED ORDER — HYDROMORPHONE HCL 1 MG/ML IJ SOLN
1.0000 mg | Freq: Once | INTRAMUSCULAR | Status: AC
  Administered 2018-12-24: 1 mg via INTRAVENOUS
  Filled 2018-12-24: qty 1

## 2018-12-24 MED ORDER — HYDROCODONE-ACETAMINOPHEN 5-325 MG PO TABS: 1.0000 | ORAL_TABLET | ORAL | 0 refills | Status: DC | PRN

## 2018-12-24 NOTE — Discharge Instructions (Addendum)
If you develop recurrent, continued, or worsening chest pain, shortness of breath, fever, vomiting, abdominal or back pain, weakness or numbness in your arms/legs, or any other new/concerning symptoms then return to the ER for evaluation.   There was a meningioma and a lung nodule seen on your CT scans today.  You will need to follow-up with your primary care physician for monitoring of these.

## 2018-12-24 NOTE — ED Notes (Signed)
Pt in X ray

## 2018-12-24 NOTE — ED Notes (Signed)
Patient verbalizes understanding of discharge instructions. Opportunity for questioning and answers were provided. Armband removed by staff, pt discharged from ED.  

## 2018-12-24 NOTE — ED Triage Notes (Signed)
Pt c/o neck pain that worsened last night. Pt woke up with L sided chest pain as well; denies sob. Pt reports shoulder pain for the past week that she received a cortisone shot for

## 2018-12-24 NOTE — ED Notes (Signed)
Pt being transported to x-ray

## 2018-12-24 NOTE — ED Provider Notes (Signed)
[facility] EMERGENCY DEPARTMENT Provider Note   CSN: [id number] Arrival date & time: 12/24/18  [id number]     History   Chief Complaint Chief Complaint  Patient presents with   Neck Pain   Chest Pain    HPI Taylor Taylor is a 73 y.o. female.    HPI  73 year old female presents with neck pain.  Has been going on for over a week.  Most prominently hurts whenever she stands up.  At that time it is a 10/10.  Other times it is slightly bothersome.  She has been taking Voltaren and added some Tylenol.  Heat seems to help.  She was given a trigger point injection by her orthopedist last week.  This seemed to temporarily help.  No weakness or numbness in her extremities.  No headache or incontinence.  She also had some brief chest pain this morning around 2 AM.  She thinks it lasted for only a few minutes though she is not exactly sure.  She states she was feeling like she needed to take deeper breaths but was not really short of breath. She denies any significant past medical history.  No prior cardiac history.  Past Medical History:  Diagnosis Date   Allergy    Seasonal   Arthritis    back   GERD (gastroesophageal reflux disease)    in past    Patient Active Problem List   Diagnosis Date Noted   Pain in right shoulder 12/11/2018   Pain in right hand 05/18/2018   Pain in left hand 05/18/2018   Unilateral primary osteoarthritis, right knee 08/24/2016   Hypertriglyceridemia without hypercholesterolemia 09/03/2013   CHEST PAIN-PRECORDIAL 08/31/2009   CHEST PAIN, ATYPICAL 08/28/2009    Past Surgical History:  Procedure Laterality Date   CERVICAL DISC SURGERY     over 10 years   COLONOSCOPY       OB History   No obstetric history on file.      Home Medications    Prior to Admission medications   Medication Sig Start Date End Date Taking? Authorizing Provider  acetaminophen (TYLENOL) 500 MG tablet Take 500 mg by mouth 2 (two) times daily.    Yes [provider]  diclofenac (VOLTAREN) 75 MG EC tablet TAKE 1 TABLET BY MOUTH TWICE A DAY Patient taking differently: Take 75 mg by mouth 2 (two) times daily.  11/05/18  Yes [name], MD  estradiol (ESTRACE) 0.5 MG tablet Take 0.5 mg by mouth daily.  01/31/16  Yes [provider]  ferrous sulfate 325 (65 FE) MG tablet Take 325 mg by mouth daily with breakfast.   Yes [provider]  Fish Oil-Cholecalciferol (FISH OIL + D3) 1200-1000 MG-UNIT CAPS Take 1 tablet by mouth daily.   Yes [provider]  Glucosamine-Chondroit-Vit C-Mn (GLUCOSAMINE CHONDR 1500 COMPLX) CAPS Take 1 capsule by mouth 2 (two) times daily.   Yes [provider]  Melatonin 10 MG CAPS Take 10 mg by mouth at bedtime as needed (sleep).    Yes [provider]  Multiple Vitamins-Minerals (WOMENS MULTI VITAMIN & MINERAL PO) Take 1 tablet by mouth daily.   Yes [provider]  progesterone (PROMETRIUM) 100 MG capsule Take 1 capsule (100 mg total) by mouth daily. 08/06/15  Yes [name], MD  Red Yeast Rice 600 MG CAPS Take 600 mg by mouth daily.    Yes [provider]  Turmeric 500 MG TABS Take 500 mg by mouth daily.  Yes [provider]  vitamin B-12 (CYANOCOBALAMIN) 100 MCG tablet Take 100 mcg by mouth daily.   Yes [provider]  vitamin E 100 UNIT capsule Take 100 Units by mouth daily.   Yes [provider]  cyclobenzaprine (FLEXERIL) 5 MG tablet Take 1-2 tablets (5-10 mg total) by mouth 3 (three) times daily as needed for muscle spasms. 12/24/18   Sherwood Gambler, MD  HYDROcodone-acetaminophen (NORCO) 5-325 MG tablet Take 1 tablet by mouth every 4 (four) hours as needed for severe pain. 12/24/18   Sherwood Gambler, MD  lidocaine (LIDODERM) 5 % Place 1 patch onto the skin daily. Remove & Discard patch within 12 hours or as directed by MD 12/24/18   Sherwood Gambler, MD    Family History Family History  Problem  Relation Age of Onset   Parkinson's disease Mother    Brain cancer Father    Breast cancer Neg Hx     Social History Social History   Tobacco Use   Smoking status: Never Smoker   Smokeless tobacco: Never Used  Substance Use Topics   Alcohol use: No   Drug use: No     Allergies   Pollen extract   Review of Systems Review of Systems  Respiratory: Negative for shortness of breath.   Cardiovascular: Positive for chest pain.  Musculoskeletal: Positive for neck pain.  Neurological: Negative for weakness and numbness.  All other systems reviewed and are negative.    Physical Exam Updated Vital Signs BP 123/63    Pulse 62    Temp 98 F (36.7 C) (Oral)    Resp 15    SpO2 98%   Physical Exam Vitals signs and nursing note reviewed.  Constitutional:      General: She is not in acute distress.    Appearance: She is well-developed. She is not ill-appearing or diaphoretic.  HENT:     Head: Normocephalic and atraumatic.     Right Ear: External ear normal.     Left Ear: External ear normal.     Nose: Nose normal.  Eyes:     General:        Right eye: No discharge.        Left eye: No discharge.  Neck:     Musculoskeletal: Normal range of motion and neck supple. Muscular tenderness present. No neck rigidity or spinous process tenderness.      Comments: Mild tenderness. No obvious spasm or swelling Cardiovascular:     Rate and Rhythm: Normal rate and regular rhythm.     Heart sounds: Normal heart sounds.  Pulmonary:     Effort: Pulmonary effort is normal.     Breath sounds: Normal breath sounds.  Abdominal:     Palpations: Abdomen is soft.     Tenderness: There is no abdominal tenderness.  Skin:    General: Skin is warm and dry.  Neurological:     Mental Status: She is alert.     Comments: 5/5 strength in all 4 extremities. Normal gross sensation  Psychiatric:        Mood and Affect: Mood is not anxious.      ED Treatments / Results  Labs (all labs  ordered are listed, but only abnormal results are displayed) Labs Reviewed  BASIC METABOLIC PANEL - Abnormal; Notable for the following components:      Result Value   Glucose, Bld 103 (*)    BUN 25 (*)    GFR calc non Af Amer 57 (*)  All other components within normal limits  CBC  TROPONIN I (HIGH SENSITIVITY)  TROPONIN I (HIGH SENSITIVITY)    EKG EKG Interpretation  Date/Time:  Monday December 24 2018 06:54:30 EDT Ventricular Rate:  74 PR Interval:  144 QRS Duration: 80 QT Interval:  390 QTC Calculation: 433 R Axis:   69 Text Interpretation:  Sinus rhythm Low voltage, precordial leads Borderline T abnormalities, anterior leads no significant change since earlier in the day Confirmed by Sherwood Gambler [phone]) on 12/24/2018 7:01:03 AM   Radiology Ct Angio Head W Or Wo Contrast  Result Date: 12/24/2018 CLINICAL DATA:  Dissection of vertebral artery. Additional history provided: Patient reports neck pain that worsened last night, awoke with left-sided chest pain, shoulder pain for past week. EXAM: CT ANGIOGRAPHY HEAD AND NECK TECHNIQUE: Multidetector CT imaging of the head and neck was performed using the standard protocol during bolus administration of intravenous contrast. Multiplanar CT image reconstructions and MIPs were obtained to evaluate the vascular anatomy. Carotid stenosis measurements (when applicable) are obtained utilizing NASCET criteria, using the distal internal carotid diameter as the denominator. CONTRAST:  23mL OMNIPAQUE IOHEXOL 350 MG/ML SOLN COMPARISON:  Brain MRI 10/31/2010 FINDINGS: CT HEAD FINDINGS No evidence of acute intracranial hemorrhage. No demarcated cortical infarction. No midline shift or extra-axial fluid collection. Minimal patchy hypodensity within the cerebral white matter is nonspecific, but consistent with chronic small vessel ischemic disease. 4 mm rounded dural-based focus of calcification along the right parietal calvarium, nonspecific but  possibly reflecting an incidental tiny meningioma (series 6, image 66). Cerebral volume is normal for age. Vascular: Reported separately Skull: Normal. Negative for fracture or focal lesion. Sinuses: No significant paranasal sinus disease or mastoid effusion Orbits: Visualized orbits demonstrate no acute abnormality. Review of the MIP images confirms the above findings CTA NECK FINDINGS Aortic arch: Standard branching. Included portions of the arch demonstrate no evidence of dissection or aneurysm. Right carotid system: CCA and ICA patent within the neck without significant stenosis (50% or greater). No significant atherosclerotic plaque. Left carotid system: CCA and ICA patent within the neck without significant stenosis (50% or greater). No significant atherosclerotic plaque. Vertebral arteries: The bilateral vertebral arteries are patent within the neck without significant stenosis (50% or greater). No significant atherosclerotic plaque. The left vertebral artery is slightly dominant. Skeleton: Sequela of prior C5-C6 ACDF. Adjacent level disease with moderate/severe C4-C5 disc height loss. Moderate C6-C7 disc height loss. Small C4-C5 posterior disc osteophyte complex. Multilevel uncinate/facet hypertrophy. Trace C3-C4 anterolisthesis Other neck: The soft tissue neck mass.  Thyroid unremarkable. Upper chest: There is smooth interlobular septal thick within the imaged lung apices. 3 mm right upper lobe pulmonary nodule (series 11, image 298). Review of the MIP images confirms the above findings CTA HEAD FINDINGS Anterior circulation: Bilateral intracranial internal carotid arteries patent without significant stenosis. Right middle and anterior cerebral arteries patent without significant proximal stenosis. Left middle and anterior cerebral arteries patent without significant proximal stenosis. No intracranial aneurysm identified. Posterior circulation: Intracranial vertebral arteries are patent without significant  stenosis. Basilar artery is patent without significant stenosis. Bilateral posterior cerebral arteries patent without significant proximal stenosis. Venous sinuses: Within limitations of contrast timing, no convincing thrombus. Anatomic variants: A right posterior communicating artery is poorly delineated and may be hypoplastic or absent Review of the MIP images confirms the above findings IMPRESSION: CT head: 1. No CT evidence of acute intracranial abnormality. 2. Minimal chronic small vessel ischemic disease. 3. Question tiny 4 mm incidental meningioma along the right parietal  calvarium. CTA head: No intracranial large vessel occlusion or proximal high-grade arterial stenosis. CTA neck: 1. Common carotid, internal carotid and vertebral arteries widely patent within the neck. No evidence of dissection or aneurysm. 2. Smooth interlobular septal thickening within the imaged lung apices, which may reflect interstitial edema. Clinical correlation recommended. 3. 3 mm right upper lobe pulmonary nodule. No follow-up needed if patient is low-risk. Non-contrast chest CT can be considered in 12 months if patient is high-risk. This recommendation follows the consensus statement: Guidelines for Management of Incidental Pulmonary Nodules Detected on CT Images: From the Fleischner Society 2017; Radiology 2017; 284:228-243. Electronically Signed   By: Kellie Simmering   On: 12/24/2018 12:24   Dg Chest 2 View  Result Date: 12/24/2018 CLINICAL DATA:  Neck pain, left-sided chest pain. EXAM: CHEST - 2 VIEW COMPARISON:  No priors available for comparison. FINDINGS: Cardiomediastinal contours are normal. Lungs are clear with moderate hyperinflation. No acute bone finding.  Osteopenia and spinal degenerative change. IMPRESSION: Moderate hyperinflation without signs of acute cardiopulmonary process. Electronically Signed   By: Zetta Bills M.D.   On: 12/24/2018 07:51   Ct Angio Neck W And/or Wo Contrast  Result Date:  12/24/2018 CLINICAL DATA:  Dissection of vertebral artery. Additional history provided: Patient reports neck pain that worsened last night, awoke with left-sided chest pain, shoulder pain for past week. EXAM: CT ANGIOGRAPHY HEAD AND NECK TECHNIQUE: Multidetector CT imaging of the head and neck was performed using the standard protocol during bolus administration of intravenous contrast. Multiplanar CT image reconstructions and MIPs were obtained to evaluate the vascular anatomy. Carotid stenosis measurements (when applicable) are obtained utilizing NASCET criteria, using the distal internal carotid diameter as the denominator. CONTRAST:  93mL OMNIPAQUE IOHEXOL 350 MG/ML SOLN COMPARISON:  Brain MRI 10/31/2010 FINDINGS: CT HEAD FINDINGS No evidence of acute intracranial hemorrhage. No demarcated cortical infarction. No midline shift or extra-axial fluid collection. Minimal patchy hypodensity within the cerebral white matter is nonspecific, but consistent with chronic small vessel ischemic disease. 4 mm rounded dural-based focus of calcification along the right parietal calvarium, nonspecific but possibly reflecting an incidental tiny meningioma (series 6, image 66). Cerebral volume is normal for age. Vascular: Reported separately Skull: Normal. Negative for fracture or focal lesion. Sinuses: No significant paranasal sinus disease or mastoid effusion Orbits: Visualized orbits demonstrate no acute abnormality. Review of the MIP images confirms the above findings CTA NECK FINDINGS Aortic arch: Standard branching. Included portions of the arch demonstrate no evidence of dissection or aneurysm. Right carotid system: CCA and ICA patent within the neck without significant stenosis (50% or greater). No significant atherosclerotic plaque. Left carotid system: CCA and ICA patent within the neck without significant stenosis (50% or greater). No significant atherosclerotic plaque. Vertebral arteries: The bilateral vertebral  arteries are patent within the neck without significant stenosis (50% or greater). No significant atherosclerotic plaque. The left vertebral artery is slightly dominant. Skeleton: Sequela of prior C5-C6 ACDF. Adjacent level disease with moderate/severe C4-C5 disc height loss. Moderate C6-C7 disc height loss. Small C4-C5 posterior disc osteophyte complex. Multilevel uncinate/facet hypertrophy. Trace C3-C4 anterolisthesis Other neck: The soft tissue neck mass.  Thyroid unremarkable. Upper chest: There is smooth interlobular septal thick within the imaged lung apices. 3 mm right upper lobe pulmonary nodule (series 11, image 298). Review of the MIP images confirms the above findings CTA HEAD FINDINGS Anterior circulation: Bilateral intracranial internal carotid arteries patent without significant stenosis. Right middle and anterior cerebral arteries patent without significant proximal stenosis. Left  middle and anterior cerebral arteries patent without significant proximal stenosis. No intracranial aneurysm identified. Posterior circulation: Intracranial vertebral arteries are patent without significant stenosis. Basilar artery is patent without significant stenosis. Bilateral posterior cerebral arteries patent without significant proximal stenosis. Venous sinuses: Within limitations of contrast timing, no convincing thrombus. Anatomic variants: A right posterior communicating artery is poorly delineated and may be hypoplastic or absent Review of the MIP images confirms the above findings IMPRESSION: CT head: 1. No CT evidence of acute intracranial abnormality. 2. Minimal chronic small vessel ischemic disease. 3. Question tiny 4 mm incidental meningioma along the right parietal calvarium. CTA head: No intracranial large vessel occlusion or proximal high-grade arterial stenosis. CTA neck: 1. Common carotid, internal carotid and vertebral arteries widely patent within the neck. No evidence of dissection or aneurysm. 2.  Smooth interlobular septal thickening within the imaged lung apices, which may reflect interstitial edema. Clinical correlation recommended. 3. 3 mm right upper lobe pulmonary nodule. No follow-up needed if patient is low-risk. Non-contrast chest CT can be considered in 12 months if patient is high-risk. This recommendation follows the consensus statement: Guidelines for Management of Incidental Pulmonary Nodules Detected on CT Images: From the Fleischner Society 2017; Radiology 2017; 284:228-243. Electronically Signed   By: Kellie Simmering   On: 12/24/2018 12:24    Procedures Procedures (including critical care time)  Medications Ordered in ED Medications  lidocaine (LIDODERM) 5 % 1 patch (1 patch Transdermal Patch Applied 12/24/18 0740)  sodium chloride flush (NS) 0.9 % injection 3 mL (3 mLs Intravenous Given 12/24/18 1155)  cyclobenzaprine (FLEXERIL) tablet 5 mg (5 mg Oral Given 12/24/18 0740)  morphine 4 MG/ML injection 4 mg (4 mg Intravenous Given 12/24/18 1014)  iohexol (OMNIPAQUE) 350 MG/ML injection 50 mL (50 mLs Intravenous Contrast Given 12/24/18 1135)  HYDROmorphone (DILAUDID) injection 1 mg (1 mg Intravenous Given 12/24/18 1152)     Initial Impression / Assessment and Plan / ED Course  I have reviewed the triage vital signs and the nursing notes.  Pertinent labs & imaging results that were available during my care of the patient were reviewed by me and considered in my medical decision making (see chart for details).     HEAR Score: 3  Patient's chest pain is difficult to assess as it was very brief this morning.  She does have some inverted T waves which are new but this is also compared to many years ago.  Given this I offered options and patient would like to do delta troponin and discharge home.  With her low hear score I think that is reasonable.  Suspicion for PE or dissection is lower.  As for her neck, given the location, CT angiography of head and neck were obtained to help rule  out dissection.  These are unremarkable as above.  She was made aware of the incidental findings of meningioma and nodule in her lung.  Otherwise, somewhat difficult to control her pain but it is now better.  I think this is muscular and she has no radicular or spinal symptoms.  She already has follow-up with her orthopedist tomorrow.  No indication for emergent MRI.  Not consistent with ACS. Discharged home with return precautions.  Final Clinical Impressions(s) / ED Diagnoses   Final diagnoses:  Strain of neck muscle, initial encounter  Nonspecific chest pain  Meningioma (HCC)  Lung nodule    ED Discharge Orders         Ordered    lidocaine (LIDODERM) 5 %  Every 24 hours     12/24/18 0819    cyclobenzaprine (FLEXERIL) 5 MG tablet  3 times daily PRN     12/24/18 0819    HYDROcodone-acetaminophen (NORCO) 5-325 MG tablet  Every 4 hours PRN     12/24/18 1301           Sherwood Gambler, MD 12/24/18 1317

## 2018-12-25 ENCOUNTER — Telehealth: Payer: Self-pay | Admitting: Orthopaedic Surgery

## 2018-12-25 ENCOUNTER — Ambulatory Visit: Payer: PPO | Admitting: Orthopaedic Surgery

## 2018-12-25 ENCOUNTER — Encounter: Payer: Self-pay | Admitting: Orthopaedic Surgery

## 2018-12-25 VITALS — BP 126/62 | HR 73

## 2018-12-25 DIAGNOSIS — M542 Cervicalgia: Secondary | ICD-10-CM | POA: Diagnosis not present

## 2018-12-25 HISTORY — DX: Cervicalgia: M54.2

## 2018-12-25 MED ORDER — HYDROMORPHONE HCL 2 MG PO TABS: 2.0000 mg | ORAL_TABLET | Freq: Three times a day (TID) | ORAL | 0 refills | Status: DC | PRN

## 2018-12-25 MED ORDER — LIDOCAINE HCL 1 % IJ SOLN
1.0000 mL | INTRAMUSCULAR | Status: AC | PRN
  Administered 2018-12-25: 1 mL

## 2018-12-25 NOTE — Telephone Encounter (Signed)
Please see below.

## 2018-12-25 NOTE — Progress Notes (Signed)
Office Visit Note   [name]           Date of Birth: [birth year]           MRN: [id number] Visit Date: 12/25/2018              Requested by: [name], MD [address] [place],   [id number] PCP: [name], MD   Assessment & Plan: Visit Diagnoses:  1. Neck pain   2. Neck pain on right side     Plan: [name] had rather cute onset of right-sided neck pain within the last several days the point of compromise.  She was seen in the emergency room yesterday.  CT angiography of the head and neck were obtained to rule out dissection.  There were unremarkable.  They felt that her pain was probably muscular in origin.  She was given a few Dilaudid pills and notes that that is made a big difference and last perhaps 2 or 3 hours.  She was also given a prescription for Lidoderm patches and Flexeril.  She found that the Dilaudid helped more than "anything".  I reviewed her CT angiogram report and her ER visit.  I think there is a reasonable chance that some of this pain may be referred from her cervical spine.  She has had a prior fusion at C5-6 by Dr. Ellene Route.  She does have degenerative changes at the level above and below.  She is not having any referred pain to either upper or lower extremity and no numbness or tingling.  She had one area of trigger point tenderness that I injected with cortisone with questionable results.  I will refer her to Dr. Ellene Route and call to make the appointment.  I will also refill her Dilaudid.  She might try Voltaren gel or salon poss that area of tenderness along the right side of her calvarium posteriorly.  I did apply a cervical collar  Follow-Up Instructions: Return if symptoms worsen or fail to improve.   Orders:  Orders Placed This Encounter  Procedures  . Trigger Point Inj  . Ambulatory referral to Orthopedic Surgery   Meds ordered this encounter  Medications  . HYDROmorphone (DILAUDID) 2 MG tablet    Sig: Take 1  tablet (2 mg total) by mouth 3 (three) times daily as needed for severe pain.    Dispense:  30 tablet    Refill:  0      Procedures: Trigger Point Inj  Date/Time: 12/25/2018 12:15 PM Performed by: Garald Balding, MD Authorized by: Garald Balding, MD   Total # of Trigger Points:  1 Location: neck   Needle Size:  27 G Medications #1:  1 mL lidocaine 1 % Comments: 20 mill grams of Depo-Medrol injected into the area of trigger point tenderness the right side of the calvarium posteriorly     Clinical Data: No additional findings.   Subjective: Chief Complaint  Patient presents with  . Neck - Pain  Patient presents today with neck pain. Patient states that this pain has been present for two days. No known injury. Her pain is located is located posteriorly on the top of her neck on the right side. Her pain is present when she stands up and goes away after lying down for 20 minutes with application of ice or heat. She was evaluated yesterday at [facility] ED and had a CT scan of her neck.She was told she had a  muscle strain. She was given muscle relaxers, hydrocodone, and Lidoderm for pain. Patient states that it has not helped. She only got relief with Dilaudid at the ED. She received a trigger point injection two weeks ago with Dr.Nadira Single near her scapula. No related referred pain to either upper or lower extremity.  Specifically, no numbness or tingling.  She is not had any headaches.  The pain seems to be quite severe and localized when she stands for any length of time along the right side of her proximal cervical spine and calvarium.  Has had prior cervical spine fusion by Dr. Ellene Route at C5-6.  CT scan did demonstrate degenerative changes at C4-5 and C 6 7 I.  E.  The level above and below the fusion.  No soft tissue masses.  Common carotid internal carotid and vertebral arteries widely patent.  There was a 3 mm right upper lobe pulmonary nodule that she is aware of and we can  discuss with her primary care physician HPI  Review of Systems  Constitutional: Negative for fatigue.  HENT: Negative for ear pain.   Eyes: Negative for pain.  Respiratory: Negative for shortness of breath.   Cardiovascular: Negative for leg swelling.  Gastrointestinal: Negative for constipation and diarrhea.  Endocrine: Negative for cold intolerance and heat intolerance.  Genitourinary: Negative for difficulty urinating.  Musculoskeletal: Negative for joint swelling.  Skin: Negative for rash.  Allergic/Immunologic: Negative for food allergies.  Neurological: Negative for weakness.  Hematological: Does not bruise/bleed easily.  Psychiatric/Behavioral: Negative for sleep disturbance.     Objective: Vital Signs: BP 126/62   Pulse 73   Physical Exam Constitutional:      Appearance: She is well-developed.  Eyes:     Pupils: Pupils are equal, round, and reactive to light.  Pulmonary:     Effort: Pulmonary effort is normal.  Skin:    General: Skin is warm and dry.  Neurological:     Mental Status: She is alert and oriented to person, place, and time.  Psychiatric:        Behavior: Behavior normal.     Ortho Exam comfortable lying down with an ice pack behind her neck.  Standing she had an area of tenderness directly over the posterior aspect of her calvarium. no masses.  I did inject this area of trigger point tenderness with some initial relief.  I suspect the problem is referred and may be from her cervical spine given that she has arthritic changes above and below the area of fusion at C5-6.  Neurologically appeared to be intact.  No pain with range of motion of either shoulder.  Had some mild loss of cervical spine motion but nothing that appeared acute.  Specialty Comments:  No specialty comments available.  Imaging: No results found.   PMFS History: Patient Active Problem List   Diagnosis Date Noted  . Neck pain on right side 12/25/2018  . Pain in right shoulder  12/11/2018  . Pain in right hand 05/18/2018  . Pain in left hand 05/18/2018  . Unilateral primary osteoarthritis, right knee 08/24/2016  . Hypertriglyceridemia without hypercholesterolemia 09/03/2013  . CHEST PAIN-PRECORDIAL 08/31/2009  . CHEST PAIN, ATYPICAL 08/28/2009   Past Medical History:  Diagnosis Date  . Allergy    Seasonal  . Arthritis    back  . GERD (gastroesophageal reflux disease)    in past    Family History  Problem Relation Age of Onset  . Parkinson's disease Mother   . Brain cancer Father   .  Breast cancer Neg Hx     Past Surgical History:  Procedure Laterality Date  . CERVICAL DISC SURGERY     over 10 years  . COLONOSCOPY     Social History   Occupational History  . Not on file  Tobacco Use  . Smoking status: Never Smoker  . Smokeless tobacco: Never Used  Substance and Sexual Activity  . Alcohol use: No  . Drug use: No  . Sexual activity: Not on file

## 2018-12-25 NOTE — Telephone Encounter (Signed)
Patient has an appt this am, but in so much pain not sure if she can get here. Patient request a call to discuss neck pain/ appointment.

## 2018-12-25 NOTE — Telephone Encounter (Signed)
Taylor Taylor from [facility] left a voicemail stating Dr. Ellene Route reviewed the CT and we scheduled the next available appointment for patient which is Friday, 01/11/19 to arrive at 7:45 for 8:15 appointment.  Patient has been notified.  If you have any questions, please call [phone]

## 2018-12-26 ENCOUNTER — Other Ambulatory Visit: Payer: Self-pay | Admitting: Orthopaedic Surgery

## 2018-12-26 ENCOUNTER — Telehealth: Payer: Self-pay | Admitting: Orthopaedic Surgery

## 2018-12-26 DIAGNOSIS — M542 Cervicalgia: Secondary | ICD-10-CM

## 2018-12-26 NOTE — Telephone Encounter (Signed)
FYI

## 2018-12-26 NOTE — Telephone Encounter (Signed)
Webster, thank you!! I just wanted to check my options before calling her.

## 2018-12-26 NOTE — Telephone Encounter (Signed)
Patient called stating the earliest appointment available with Dr. Ellene Route was 01/11/19.  Patient states she is in a lot of pain and is requesting a return call to discuss scheduling with another doctor.  Patient states she asked to be referred to Dr. Ellene Route because she has seen him in the past, but would be willing to see someone else in the same practice if she could get an earlier appointment.

## 2018-12-26 NOTE — Telephone Encounter (Signed)
Try Dr Eula Fried Mrs Rasso know that an appt with anybody else in Dr Clarice Pole office is not possible before 10/23

## 2018-12-26 NOTE — Telephone Encounter (Signed)
Your welcome.

## 2018-12-26 NOTE — Telephone Encounter (Signed)
Hey! Before I call this patient regarding the below message.... Would Dr.Nitka be able to see this patient before the 23rd of October? She is having severe pain in her neck. Thanks!

## 2018-12-26 NOTE — Telephone Encounter (Signed)
thanks

## 2018-12-26 NOTE — Telephone Encounter (Signed)
Called patient. No answer. LMOM that the soonest Elsner can see her is 01/11/2019. She does not feel that she can wait that long. [facility] does not allow patient's to switch doctors once they are established.   I contacted Dr.Nitka and he cannot see her until December of this year. I have put a referral into [facility] and ordered it urgent. I told her to keep the appointment with Dr.Elsner because that may in fact be the earliest appointment she can obtain. I ask patient to call back if she has any further questions.

## 2018-12-26 NOTE — Telephone Encounter (Signed)
Anybody else you recommend?

## 2018-12-26 NOTE — Telephone Encounter (Signed)
No, currently he does not have anything before December for a new patient.  These are extremely long visits so we only have 2 appts for new patients a week.  They are very detailed.

## 2018-12-27 ENCOUNTER — Other Ambulatory Visit: Payer: PPO

## 2018-12-28 ENCOUNTER — Encounter: Payer: Self-pay | Admitting: Family Medicine

## 2018-12-31 ENCOUNTER — Ambulatory Visit: Payer: PPO | Admitting: Family Medicine

## 2019-01-08 ENCOUNTER — Ambulatory Visit: Payer: PPO | Admitting: Orthopaedic Surgery

## 2019-01-14 ENCOUNTER — Ambulatory Visit
Admission: RE | Admit: 2019-01-14 | Discharge: 2019-01-14 | Disposition: A | Discharge status: Home / Self Care | Payer: PPO | Source: Ambulatory Visit | Attending: Orthopaedic Surgery | Admitting: Orthopaedic Surgery

## 2019-01-14 DIAGNOSIS — M899 Disorder of bone, unspecified: Secondary | ICD-10-CM

## 2019-01-14 DIAGNOSIS — G8929 Other chronic pain: Secondary | ICD-10-CM

## 2019-01-14 DIAGNOSIS — J9811 Atelectasis: Secondary | ICD-10-CM | POA: Diagnosis not present

## 2019-01-17 ENCOUNTER — Other Ambulatory Visit: Payer: PPO

## 2019-01-29 ENCOUNTER — Ambulatory Visit: Payer: PPO | Admitting: Orthopaedic Surgery

## 2019-02-07 ENCOUNTER — Telehealth: Payer: Self-pay | Admitting: Orthopaedic Surgery

## 2019-02-07 NOTE — Telephone Encounter (Signed)
Patient called asked if she can be put on a cancellation list. Patient is schedule 02/26/2019 with Dr Durward Fortes

## 2019-02-12 ENCOUNTER — Other Ambulatory Visit: Payer: Self-pay

## 2019-02-12 DIAGNOSIS — Z20822 Contact with and (suspected) exposure to covid-19: Secondary | ICD-10-CM

## 2019-02-13 LAB — NOVEL CORONAVIRUS, NAA: SARS-CoV-2, NAA: NOT DETECTED

## 2019-02-15 DIAGNOSIS — R502 Drug induced fever: Secondary | ICD-10-CM | POA: Diagnosis not present

## 2019-02-15 DIAGNOSIS — Z20828 Contact with and (suspected) exposure to other viral communicable diseases: Secondary | ICD-10-CM | POA: Diagnosis not present

## 2019-02-15 DIAGNOSIS — R05 Cough: Secondary | ICD-10-CM | POA: Diagnosis not present

## 2019-02-15 DIAGNOSIS — J189 Pneumonia, unspecified organism: Secondary | ICD-10-CM | POA: Diagnosis not present

## 2019-02-15 DIAGNOSIS — R5383 Other fatigue: Secondary | ICD-10-CM | POA: Diagnosis not present

## 2019-02-19 DIAGNOSIS — J189 Pneumonia, unspecified organism: Secondary | ICD-10-CM | POA: Diagnosis not present

## 2019-02-20 ENCOUNTER — Ambulatory Visit: Payer: PPO | Admitting: Family Medicine

## 2019-02-21 ENCOUNTER — Ambulatory Visit: Payer: PPO | Admitting: Family Medicine

## 2019-02-26 ENCOUNTER — Ambulatory Visit (INDEPENDENT_AMBULATORY_CARE_PROVIDER_SITE_OTHER): Payer: PPO | Admitting: Orthopaedic Surgery

## 2019-02-26 ENCOUNTER — Other Ambulatory Visit: Payer: Self-pay

## 2019-02-26 ENCOUNTER — Encounter: Payer: Self-pay | Admitting: Orthopaedic Surgery

## 2019-02-26 VITALS — Ht 63.0 in | Wt 125.0 lb

## 2019-02-26 DIAGNOSIS — M25511 Pain in right shoulder: Secondary | ICD-10-CM

## 2019-02-26 DIAGNOSIS — G8929 Other chronic pain: Secondary | ICD-10-CM

## 2019-02-26 MED ORDER — METHYLPREDNISOLONE ACETATE 40 MG/ML IJ SUSP
40.0000 mg | INTRAMUSCULAR | Status: AC | PRN
  Administered 2019-02-26: 15:00:00 40 mg via INTRAMUSCULAR

## 2019-02-26 MED ORDER — LIDOCAINE HCL 1 % IJ SOLN
1.0000 mL | INTRAMUSCULAR | Status: AC | PRN
  Administered 2019-02-26: 1 mL

## 2019-02-26 NOTE — Progress Notes (Signed)
Office Visit Note   [name]           Date of Birth: Mar 19, 1946           MRN: [id number] Visit Date: 02/26/2019              Requested by: [name], MD [address] [place],  [place] [address] PCP: [name], MD   Assessment & Plan: Visit Diagnoses:  1. Chronic right shoulder pain     Plan: 1 area of trigger point tenderness along the vertebral border of the right scapula.  I have injected this with cortisone and we will plan to see her back as necessary  Follow-Up Instructions: Return if symptoms worsen or fail to improve.   Orders:  Orders Placed This Encounter  Procedures  . Trigger Point Inj   No orders of the defined types were placed in this encounter.     Procedures: Trigger Point Inj  Date/Time: 02/26/2019 2:32 PM Performed by: Garald Balding, MD Authorized by: Garald Balding, MD   Indications:  Pain Total # of Trigger Points:  1 Location: shoulder   Needle Size:  27 G Approach:  Dorsal Medications #1:  1 mL lidocaine 1 %; 40 mg methylPREDNISolone acetate 40 MG/ML     Clinical Data: No additional findings.   Subjective: Chief Complaint  Patient presents with  . Right Shoulder - Pain  Patient presents today for right shoulder pain. She was evaluated in September and received a trigger point injection. She was also here a month later for neck pain. Patient states that the injection helped, but has since worn off. She would like to get another injection today. She takes oral Voltaren each day and also applies some voltaren gel to that area.  HPI  Review of Systems   Objective: Vital Signs: Ht 5\' 3"  (1.6 m)   Wt 125 lb (56.7 kg)   BMI 22.14 kg/m   Physical Exam Constitutional:      Appearance: She is well-developed.  Eyes:     Pupils: Pupils are equal, round, and reactive to light.  Pulmonary:     Effort: Pulmonary effort is normal.  Skin:    General: Skin is warm and dry.  Neurological:      Mental Status: She is alert and oriented to person, place, and time.  Psychiatric:        Behavior: Behavior normal.     Ortho Exam area of single  trigger point tenderness along the vertebral border of the right scapula.  No masses.  Skin intact.  Negative impingement empty can testing and no pain related to the shoulder.  Specialty Comments:  No specialty comments available.  Imaging: No results found.   PMFS History: Patient Active Problem List   Diagnosis Date Noted  . Neck pain on right side 12/25/2018  . Pain in right shoulder 12/11/2018  . Pain in right hand 05/18/2018  . Pain in left hand 05/18/2018  . Unilateral primary osteoarthritis, right knee 08/24/2016  . Hypertriglyceridemia without hypercholesterolemia 09/03/2013  . CHEST PAIN-PRECORDIAL 08/31/2009  . CHEST PAIN, ATYPICAL 08/28/2009   Past Medical History:  Diagnosis Date  . Allergy    Seasonal  . Arthritis    back  . GERD (gastroesophageal reflux disease)    in past    Family History  Problem Relation Age of Onset  . Parkinson's disease Mother   . Brain cancer Father   . Breast  cancer Neg Hx     Past Surgical History:  Procedure Laterality Date  . CERVICAL DISC SURGERY     over 10 years  . COLONOSCOPY     Social History   Occupational History  . Not on file  Tobacco Use  . Smoking status: Never Smoker  . Smokeless tobacco: Never Used  Substance and Sexual Activity  . Alcohol use: No  . Drug use: No  . Sexual activity: Not on file

## 2019-04-11 ENCOUNTER — Ambulatory Visit: Payer: PPO

## 2019-04-12 ENCOUNTER — Ambulatory Visit: Payer: PPO | Attending: Internal Medicine

## 2019-04-12 DIAGNOSIS — Z23 Encounter for immunization: Secondary | ICD-10-CM

## 2019-04-12 NOTE — Progress Notes (Signed)
   Covid-19 Vaccination Clinic  Name:  Taylor Taylor    MRN: [id number] DOB: Jul 22, 1945  04/12/2019  [name] was observed post Covid-19 immunization for 15 minutes without incidence. She was provided with Vaccine Information Sheet and instruction to access the V-Safe system.   [name] was instructed to call 911 with any severe reactions post vaccine: Marland Kitchen Difficulty breathing  . Swelling of your face and throat  . A fast heartbeat  . A bad rash all over your body  . Dizziness and weakness    Immunizations Administered    Name Date Dose VIS Date Route   Pfizer COVID-19 Vaccine 04/12/2019  9:01 AM 0.3 mL 03/01/2019 Intramuscular   Manufacturer: [place]   Lot: [id number]   [place]: [id number]

## 2019-04-15 ENCOUNTER — Ambulatory Visit: Payer: PPO

## 2019-04-22 DIAGNOSIS — H04123 Dry eye syndrome of bilateral lacrimal glands: Secondary | ICD-10-CM | POA: Diagnosis not present

## 2019-04-22 DIAGNOSIS — H5203 Hypermetropia, bilateral: Secondary | ICD-10-CM | POA: Diagnosis not present

## 2019-04-29 ENCOUNTER — Telehealth: Payer: Self-pay | Admitting: Orthopaedic Surgery

## 2019-04-29 NOTE — Telephone Encounter (Signed)
Spoke with patient and schedule an appointment with Dr Durward Fortes. Patient asked if she can be put on a wait list for a sooner appointment? The number to contact patient is [phone]

## 2019-04-29 NOTE — Telephone Encounter (Signed)
Called and spoke with patient. Rescheduled.

## 2019-04-30 ENCOUNTER — Ambulatory Visit (INDEPENDENT_AMBULATORY_CARE_PROVIDER_SITE_OTHER): Payer: PPO

## 2019-04-30 ENCOUNTER — Encounter: Payer: Self-pay | Admitting: Orthopaedic Surgery

## 2019-04-30 ENCOUNTER — Ambulatory Visit: Payer: PPO | Admitting: Orthopaedic Surgery

## 2019-04-30 ENCOUNTER — Other Ambulatory Visit: Payer: Self-pay

## 2019-04-30 VITALS — Ht 63.0 in | Wt 128.0 lb

## 2019-04-30 DIAGNOSIS — G8929 Other chronic pain: Secondary | ICD-10-CM | POA: Diagnosis not present

## 2019-04-30 DIAGNOSIS — M25511 Pain in right shoulder: Secondary | ICD-10-CM | POA: Diagnosis not present

## 2019-04-30 DIAGNOSIS — M25551 Pain in right hip: Secondary | ICD-10-CM | POA: Diagnosis not present

## 2019-04-30 HISTORY — DX: Pain in right hip: M25.551

## 2019-04-30 MED ORDER — METHYLPREDNISOLONE 4 MG PO TBPK: ORAL_TABLET | ORAL | 0 refills | Status: DC

## 2019-04-30 MED ORDER — LIDOCAINE HCL 1 % IJ SOLN
1.0000 mL | INTRAMUSCULAR | Status: AC | PRN
  Administered 2019-04-30: 1 mL

## 2019-04-30 MED ORDER — METHYLPREDNISOLONE ACETATE 40 MG/ML IJ SUSP
40.0000 mg | INTRAMUSCULAR | Status: AC | PRN
  Administered 2019-04-30: 17:00:00 40 mg via INTRAMUSCULAR

## 2019-04-30 NOTE — Progress Notes (Signed)
Office Visit Note   [name]           Date of Birth: Dec 16, 1945           MRN: [id number] Visit Date: 04/30/2019              Requested by: [name], MD [address] [place],  [place] [address] PCP: [name], MD   Assessment & Plan: Visit Diagnoses:  1. Pain in right hip   2. Chronic right shoulder pain     Plan: Recurrent trigger point tenderness along the vertebral border of the right scapula.  Will inject again with Xylocaine and Depo-Medrol.  No obvious shoulder origin.  Right hip pain appears to be bursitis but could be referred from her back.  No evidence of abnormality by x-ray of the right hip.  Return over the next several weeks if no improvement with Medrol Dosepak to consider cortisone injection about the right hip  Follow-Up Instructions: Return if symptoms worsen or fail to improve.   Orders:  Orders Placed This Encounter  Procedures  . Trigger Point Inj  . XR HIP UNILAT W OR W/O PELVIS 2-3 VIEWS RIGHT   Meds ordered this encounter  Medications  . methylPREDNISolone (MEDROL DOSEPAK) 4 MG TBPK tablet    Sig: Take as directed on package    Dispense:  21 tablet    Refill:  0      Procedures: Trigger Point Inj  Date/Time: 04/30/2019 5:19 PM Performed by: Garald Balding, MD Authorized by: Garald Balding, MD   Consent Given by:  Patient Site marked: the procedure site was marked   Indications:  Pain Total # of Trigger Points:  1 Location: shoulder   Needle Size:  27 G Approach:  Dorsal Medications #1:  1 mL lidocaine 1 %; 40 mg methylPREDNISolone acetate 40 MG/ML Comments: Trigger point injected along the vertebral border of the right scapula     Clinical Data: No additional findings.   Subjective: Chief Complaint  Patient presents with  . Right Shoulder - Pain  . Right Hip - Pain  Patient presents today for her right shoulder and right hip. She was last here in December of 2020 for her shoulder  and received trigger point injections. She said that they helped and would like to get another injection today. Her pain is the same as before, and located posteriorly. Range of motion is fine, and no numbness or tingling. She takes Voltaren orally each day, along with Tylenol. She is right hand dominant.  Her right hip pain started last week while exercising. No known injury. She said that her pain is located laterally. Walking up steps causes pain and moving her leg in a certain direction causes pain. Applying heat to that area has helped. No buttock or groin pain. No numbness, tingling, or weakness in her lower legs.   HPI  Review of Systems   Objective: Vital Signs: Ht 5\' 3"  (1.6 m)   Wt 128 lb (58.1 kg)   BMI 22.67 kg/m   Physical Exam Constitutional:      Appearance: She is well-developed.  Eyes:     Pupils: Pupils are equal, round, and reactive to light.  Pulmonary:     Effort: Pulmonary effort is normal.  Skin:    General: Skin is warm and dry.  Neurological:     Mental Status: She is alert and oriented to person, place, and time.  Psychiatric:  Behavior: Behavior normal.     Ortho Exam right shoulder with negative impingement empty can testing and full range of motion.  Biceps intact.  Good grip and release.  No referred pain to right upper extremity with motion of the cervical spine.  There is an area of trigger point tenderness along the vertebral border of the right scapula without masses.  There seems to be some correlation between neck motion and her pain.  Some localized area of tenderness over the greater trochanter of the right hip but no pain with range of motion of either hip.  Straight leg raise negative  Specialty Comments:  No specialty comments available.  Imaging: XR HIP UNILAT W OR W/O PELVIS 2-3 VIEWS RIGHT  Result Date: 04/30/2019 AP of the pelvis and lateral of the right hip did not demonstrate any obvious arthritis of the right hip or acute  changes.  There are some chronic degenerative changes about both SI joints.  Moderate degenerative changes at L5-S1 on the limited AP view    PMFS History: Patient Active Problem List   Diagnosis Date Noted  . Pain in right hip 04/30/2019  . Neck pain on right side 12/25/2018  . Pain in right shoulder 12/11/2018  . Pain in right hand 05/18/2018  . Pain in left hand 05/18/2018  . Unilateral primary osteoarthritis, right knee 08/24/2016  . Hypertriglyceridemia without hypercholesterolemia 09/03/2013  . CHEST PAIN-PRECORDIAL 08/31/2009  . CHEST PAIN, ATYPICAL 08/28/2009   Past Medical History:  Diagnosis Date  . Allergy    Seasonal  . Arthritis    back  . GERD (gastroesophageal reflux disease)    in past    Family History  Problem Relation Age of Onset  . Parkinson's disease Mother   . Brain cancer Father   . Breast cancer Neg Hx     Past Surgical History:  Procedure Laterality Date  . CERVICAL DISC SURGERY     over 10 years  . COLONOSCOPY     Social History   Occupational History  . Not on file  Tobacco Use  . Smoking status: Never Smoker  . Smokeless tobacco: Never Used  Substance and Sexual Activity  . Alcohol use: No  . Drug use: No  . Sexual activity: Not on file

## 2019-05-02 ENCOUNTER — Ambulatory Visit: Payer: PPO | Attending: Internal Medicine

## 2019-05-02 DIAGNOSIS — Z23 Encounter for immunization: Secondary | ICD-10-CM | POA: Insufficient documentation

## 2019-05-02 NOTE — Progress Notes (Signed)
   Covid-19 Vaccination Clinic  Name:  Taylor Taylor    MRN: [id number] DOB: Sep 27, 1945  05/02/2019  [name] was observed post Covid-19 immunization for 15 minutes without incidence. She was provided with Vaccine Information Sheet and instruction to access the V-Safe system.   [name] was instructed to call 911 with any severe reactions post vaccine: Marland Kitchen Difficulty breathing  . Swelling of your face and throat  . A fast heartbeat  . A bad rash all over your body  . Dizziness and weakness    Immunizations Administered    Name Date Dose VIS Date Route   Pfizer COVID-19 Vaccine 05/02/2019  2:06 PM 0.3 mL 03/01/2019 Intramuscular   Manufacturer: [place]   Lot: [id number]   [place]: [id number]

## 2019-05-07 ENCOUNTER — Ambulatory Visit: Payer: PPO | Admitting: Orthopaedic Surgery

## 2019-05-08 ENCOUNTER — Ambulatory Visit: Payer: PPO | Admitting: Orthopaedic Surgery

## 2019-05-08 ENCOUNTER — Other Ambulatory Visit: Payer: Self-pay

## 2019-05-08 ENCOUNTER — Encounter: Payer: Self-pay | Admitting: Orthopaedic Surgery

## 2019-05-08 VITALS — Ht 63.0 in | Wt 128.0 lb

## 2019-05-08 DIAGNOSIS — M25551 Pain in right hip: Secondary | ICD-10-CM | POA: Diagnosis not present

## 2019-05-08 MED ORDER — BUPIVACAINE HCL 0.5 % IJ SOLN
2.0000 mL | INTRAMUSCULAR | Status: AC | PRN
  Administered 2019-05-08: 2 mL via INTRA_ARTICULAR

## 2019-05-08 MED ORDER — METHYLPREDNISOLONE ACETATE 40 MG/ML IJ SUSP
80.0000 mg | INTRAMUSCULAR | Status: AC | PRN
  Administered 2019-05-08: 80 mg via INTRA_ARTICULAR

## 2019-05-08 MED ORDER — LIDOCAINE HCL 1 % IJ SOLN
2.0000 mL | INTRAMUSCULAR | Status: AC | PRN
  Administered 2019-05-08: 2 mL

## 2019-05-08 NOTE — Progress Notes (Signed)
Office Visit Note   [name]           Date of Birth: [birth year]           MRN: [id number] Visit Date: 05/08/2019              Requested by: [name], MD [address] [place],  [place] [address] PCP: [name], MD   Assessment & Plan: Visit Diagnoses:  1. Pain in right hip     Plan: Right hip pain appears to be localized over the greater trochanter.  I am going to inject this with cortisone and monitor response.  Had prior x-rays several weeks ago that demonstrated some very minimal degenerative changes about the hip .there was some  subchondral cysts identified in the femoral head but the joint space appear to be well-maintained and clinically there is no pain with range of motion of the right hip.  After further discussion will order an MRI scan of the right hip and proximal femur  Follow-Up Instructions: Return After MRI scan right hip.   Orders:  Orders Placed This Encounter  Procedures  . Large Joint Inj: R greater trochanter  . MR Hip Right w/o contrast   No orders of the defined types were placed in this encounter.     Procedures: Large Joint Inj: R greater trochanter on 05/08/2019 9:30 AM Indications: pain and diagnostic evaluation Details: 25 G 1.5 in needle  Arthrogram: No  Medications: 2 mL lidocaine 1 %; 2 mL bupivacaine 0.5 %; 80 mg methylPREDNISolone acetate 40 MG/ML Procedure, treatment alternatives, risks and benefits explained, specific risks discussed. Consent was given by the patient. Immediately prior to procedure a time out was called to verify the correct patient, procedure, equipment, support staff and site/side marked as required. Patient was prepped and draped in the usual sterile fashion.       Clinical Data: No additional findings.   Subjective: Chief Complaint  Patient presents with  . Right Hip - Follow-up  Patient presents today for a one week follow up on her right hip. She was here last week and  give a medrol dose pak. Patient states that the medrol dose pak did not help. She said that it hurts to put weight on her right leg. The pain is located laterally, and not into her groin or buttock. She is taking Tylenol and Voltaren for pain. No numbness, tingling, or weakness. She also states that her right shoulder is still very painful where she received an injection last week.  Has areas of trigger point tenderness about the scapula that may be referred from her cervical spine.  She has seen Dr. Ellene Route in the past with evidence of osteoarthritis.  No specific pain in reference to the shoulder.  No related right groin or anterior thigh pain.  Discomfort is localized on the lateral aspect of her hip.  There is a possibility this could be referred from her back as she does have degenerative changes at L5-S1.  HPI  Review of Systems   Objective: Vital Signs: Ht 5\' 3"  (1.6 m)   Wt 128 lb (58.1 kg)   BMI 22.67 kg/m   Physical Exam Constitutional:      Appearance: She is well-developed.  Eyes:     Pupils: Pupils are equal, round, and reactive to light.  Pulmonary:     Effort: Pulmonary effort is normal.  Skin:    General: Skin is warm and dry.  Neurological:     Mental Status: She is alert and oriented to person, place, and time.  Psychiatric:        Behavior: Behavior normal.     Ortho Exam awake alert and oriented x3.  Comfortable sitting.  Painless range of motion of right hip with internal and external rotation.  Does have local tenderness over the greater trochanter the right hip.  No masses.  No anterior medial thigh pain.  No knee pain.  Specialty Comments:  No specialty comments available.  Imaging: No results found.   PMFS History: Patient Active Problem List   Diagnosis Date Noted  . Pain in right hip 04/30/2019  . Neck pain on right side 12/25/2018  . Pain in right shoulder 12/11/2018  . Pain in right hand 05/18/2018  . Pain in left hand 05/18/2018  .  Unilateral primary osteoarthritis, right knee 08/24/2016  . Hypertriglyceridemia without hypercholesterolemia 09/03/2013  . CHEST PAIN-PRECORDIAL 08/31/2009  . CHEST PAIN, ATYPICAL 08/28/2009   Past Medical History:  Diagnosis Date  . Allergy    Seasonal  . Arthritis    back  . GERD (gastroesophageal reflux disease)    in past    Family History  Problem Relation Age of Onset  . Parkinson's disease Mother   . Brain cancer Father   . Breast cancer Neg Hx     Past Surgical History:  Procedure Laterality Date  . CERVICAL DISC SURGERY     over 10 years  . COLONOSCOPY     Social History   Occupational History  . Not on file  Tobacco Use  . Smoking status: Never Smoker  . Smokeless tobacco: Never Used  Substance and Sexual Activity  . Alcohol use: No  . Drug use: No  . Sexual activity: Not on file

## 2019-05-21 DIAGNOSIS — H04121 Dry eye syndrome of right lacrimal gland: Secondary | ICD-10-CM | POA: Diagnosis not present

## 2019-05-21 DIAGNOSIS — H04122 Dry eye syndrome of left lacrimal gland: Secondary | ICD-10-CM | POA: Diagnosis not present

## 2019-05-23 DIAGNOSIS — R3915 Urgency of urination: Secondary | ICD-10-CM | POA: Diagnosis not present

## 2019-05-23 DIAGNOSIS — R351 Nocturia: Secondary | ICD-10-CM | POA: Diagnosis not present

## 2019-05-23 DIAGNOSIS — N3946 Mixed incontinence: Secondary | ICD-10-CM | POA: Diagnosis not present

## 2019-05-23 DIAGNOSIS — R350 Frequency of micturition: Secondary | ICD-10-CM | POA: Diagnosis not present

## 2019-05-25 ENCOUNTER — Other Ambulatory Visit: Payer: Self-pay

## 2019-05-25 ENCOUNTER — Ambulatory Visit
Admission: RE | Admit: 2019-05-25 | Discharge: 2019-05-25 | Disposition: A | Discharge status: Home / Self Care | Payer: PPO | Source: Ambulatory Visit | Attending: Orthopaedic Surgery | Admitting: Orthopaedic Surgery

## 2019-05-25 DIAGNOSIS — M25551 Pain in right hip: Secondary | ICD-10-CM | POA: Diagnosis not present

## 2019-05-28 ENCOUNTER — Ambulatory Visit: Payer: PPO | Admitting: Orthopaedic Surgery

## 2019-06-17 ENCOUNTER — Encounter: Payer: PPO | Admitting: Family Medicine

## 2019-06-19 ENCOUNTER — Ambulatory Visit (INDEPENDENT_AMBULATORY_CARE_PROVIDER_SITE_OTHER): Payer: PPO | Admitting: Orthopaedic Surgery

## 2019-06-19 ENCOUNTER — Encounter: Payer: Self-pay | Admitting: Orthopaedic Surgery

## 2019-06-19 ENCOUNTER — Other Ambulatory Visit: Payer: Self-pay

## 2019-06-19 VITALS — Ht 63.0 in | Wt 128.0 lb

## 2019-06-19 DIAGNOSIS — M25551 Pain in right hip: Secondary | ICD-10-CM

## 2019-06-19 MED ORDER — LIDOCAINE HCL 1 % IJ SOLN
2.0000 mL | INTRAMUSCULAR | Status: AC | PRN
  Administered 2019-06-19: 2 mL

## 2019-06-19 MED ORDER — METHYLPREDNISOLONE ACETATE 40 MG/ML IJ SUSP
80.0000 mg | INTRAMUSCULAR | Status: AC | PRN
  Administered 2019-06-19: 80 mg via INTRA_ARTICULAR

## 2019-06-19 MED ORDER — BUPIVACAINE HCL 0.5 % IJ SOLN
2.0000 mL | INTRAMUSCULAR | Status: AC | PRN
  Administered 2019-06-19: 2 mL via INTRA_ARTICULAR

## 2019-06-19 NOTE — Progress Notes (Signed)
Office Visit Note   [name]           Date of Birth: September 11, 1945           MRN: [id number] Visit Date: 06/19/2019              Requested by: [name], MD [address] [place],  [place] [address] PCP: [name], MD   Assessment & Plan: Visit Diagnoses:  1. Pain in right hip     Plan: Bethena Roys had an MRI scan of the right hip without contrast demonstrating mild bilateral hip joint degenerative changes but no stress fracture or AVN.  There was some labral degeneration without an obvious tear and mild bilateral peritendinitis right slightly greater than left but no findings for trochanteric bursitis.  No significant intrapelvic abnormalities.  Discussed all of the above in detail.  Present pain is localized directly over the greater trochanter.  This has responded to cortisone injections in the past.  Will inject today.  Depending upon symptoms might be a good candidate for intra-articular cortisone injection of her right hip  Follow-Up Instructions: Return if symptoms worsen or fail to improve.   Orders:  Orders Placed This Encounter  Procedures  . Large Joint Inj: R greater trochanter   No orders of the defined types were placed in this encounter.     Procedures: Large Joint Inj: R greater trochanter on 06/19/2019 12:04 PM Indications: pain and diagnostic evaluation Details: 25 G 1.5 in needle  Arthrogram: No  Medications: 2 mL lidocaine 1 %; 2 mL bupivacaine 0.5 %; 80 mg methylPREDNISolone acetate 40 MG/ML Procedure, treatment alternatives, risks and benefits explained, specific risks discussed. Consent was given by the patient. Immediately prior to procedure a time out was called to verify the correct patient, procedure, equipment, support staff and site/side marked as required. Patient was prepped and draped in the usual sterile fashion.       Clinical Data: No additional findings.   Subjective: Chief Complaint  Patient presents  with  . Right Hip - Follow-up    MRI results  Patient presents today for right hip pain follow up. She had an MRI of her right hip on 05/25/2019, and is here today for those results. She had an injection of her greater trochanter on 05/08/2019 and states that it was very helpful. She has been hurting again for about one week at her lateral hip. No groin pain. She takes Voltaren orally, along with Tylenol for pain.   HPI  Review of Systems   Objective: Vital Signs: Ht 5\' 3"  (1.6 m)   Wt 128 lb (58.1 kg)   BMI 22.67 kg/m   Physical Exam Constitutional:      Appearance: She is well-developed.  Eyes:     Pupils: Pupils are equal, round, and reactive to light.  Pulmonary:     Effort: Pulmonary effort is normal.  Skin:    General: Skin is warm and dry.  Neurological:     Mental Status: She is alert and oriented to person, place, and time.  Psychiatric:        Behavior: Behavior normal.     Ortho Exam right hip with pain directly over the greater trochanter.  No crepitation.  No masses.  No swelling or erythema.  No pain with range of motion of hip with internal and external rotation.  Straight leg raise negative  Specialty Comments:  No specialty comments available.  Imaging: No results  found.   PMFS History: Patient Active Problem List   Diagnosis Date Noted  . Pain in right hip 04/30/2019  . Neck pain on right side 12/25/2018  . Pain in right shoulder 12/11/2018  . Pain in right hand 05/18/2018  . Pain in left hand 05/18/2018  . Unilateral primary osteoarthritis, right knee 08/24/2016  . Hypertriglyceridemia without hypercholesterolemia 09/03/2013  . CHEST PAIN-PRECORDIAL 08/31/2009  . CHEST PAIN, ATYPICAL 08/28/2009   Past Medical History:  Diagnosis Date  . Allergy    Seasonal  . Arthritis    back  . GERD (gastroesophageal reflux disease)    in past    Family History  Problem Relation Age of Onset  . Parkinson's disease Mother   . Brain cancer Father   .  Breast cancer Neg Hx     Past Surgical History:  Procedure Laterality Date  . CERVICAL DISC SURGERY     over 10 years  . COLONOSCOPY     Social History   Occupational History  . Not on file  Tobacco Use  . Smoking status: Never Smoker  . Smokeless tobacco: Never Used  Substance and Sexual Activity  . Alcohol use: No  . Drug use: No  . Sexual activity: Not on file

## 2019-07-02 DIAGNOSIS — H04121 Dry eye syndrome of right lacrimal gland: Secondary | ICD-10-CM | POA: Diagnosis not present

## 2019-07-04 ENCOUNTER — Other Ambulatory Visit: Payer: Self-pay

## 2019-07-04 ENCOUNTER — Other Ambulatory Visit: Payer: PPO

## 2019-07-04 DIAGNOSIS — Z0000 Encounter for general adult medical examination without abnormal findings: Secondary | ICD-10-CM | POA: Diagnosis not present

## 2019-07-05 ENCOUNTER — Other Ambulatory Visit: Payer: PPO

## 2019-07-05 LAB — CBC WITH DIFFERENTIAL/PLATELET
Absolute Monocytes: 818 cells/uL (ref 200–950)
Basophils Absolute: 49 cells/uL (ref 0–200)
Basophils Relative: 0.6 %
Eosinophils Absolute: 219 cells/uL (ref 15–500)
Eosinophils Relative: 2.7 %
HCT: 41.4 % (ref 35.0–45.0)
Hemoglobin: 13.5 g/dL (ref 11.7–15.5)
Lymphs Abs: 2876 cells/uL (ref 850–3900)
MCH: 30.8 pg (ref 27.0–33.0)
MCHC: 32.6 g/dL (ref 32.0–36.0)
MCV: 94.5 fL (ref 80.0–100.0)
MPV: 9.5 fL (ref 7.5–12.5)
Monocytes Relative: 10.1 %
Neutro Abs: 4139 cells/uL (ref 1500–7800)
Neutrophils Relative %: 51.1 %
Platelets: 296 10*3/uL (ref 140–400)
RBC: 4.38 10*6/uL (ref 3.80–5.10)
RDW: 12.4 % (ref 11.0–15.0)
Total Lymphocyte: 35.5 %
WBC: 8.1 10*3/uL (ref 3.8–10.8)

## 2019-07-05 LAB — COMPREHENSIVE METABOLIC PANEL
AG Ratio: 2 (calc) (ref 1.0–2.5)
ALT: 14 U/L (ref 6–29)
AST: 18 U/L (ref 10–35)
Albumin: 4.4 g/dL (ref 3.6–5.1)
Alkaline phosphatase (APISO): 33 U/L — ABNORMAL LOW (ref 37–153)
BUN: 16 mg/dL (ref 7–25)
CO2: 28 mmol/L (ref 20–32)
Calcium: 9.6 mg/dL (ref 8.6–10.4)
Chloride: 101 mmol/L (ref 98–110)
Creat: 0.85 mg/dL (ref 0.60–0.93)
Globulin: 2.2 g/dL (calc) (ref 1.9–3.7)
Glucose, Bld: 93 mg/dL (ref 65–99)
Potassium: 4.5 mmol/L (ref 3.5–5.3)
Sodium: 137 mmol/L (ref 135–146)
Total Bilirubin: 0.5 mg/dL (ref 0.2–1.2)
Total Protein: 6.6 g/dL (ref 6.1–8.1)

## 2019-07-05 LAB — LIPID PANEL
Cholesterol: 201 mg/dL — ABNORMAL HIGH (ref <200)
HDL: 93 mg/dL (ref >=50)
LDL Cholesterol (Calc): 96 mg/dL (calc)
Non-HDL Cholesterol (Calc): 108 mg/dL (calc) (ref <130)
Total CHOL/HDL Ratio: 2.2 (calc) (ref <5.0)
Triglycerides: 41 mg/dL (ref <150)

## 2019-07-15 ENCOUNTER — Other Ambulatory Visit: Payer: Self-pay

## 2019-07-15 ENCOUNTER — Ambulatory Visit (INDEPENDENT_AMBULATORY_CARE_PROVIDER_SITE_OTHER): Payer: PPO | Admitting: Family Medicine

## 2019-07-15 ENCOUNTER — Encounter: Payer: Self-pay | Admitting: Family Medicine

## 2019-07-15 VITALS — BP 132/68 | HR 76 | Temp 97.1°F | Resp 14 | Ht 64.0 in | Wt 132.0 lb

## 2019-07-15 DIAGNOSIS — Z0000 Encounter for general adult medical examination without abnormal findings: Secondary | ICD-10-CM | POA: Diagnosis not present

## 2019-07-15 MED ORDER — ALPRAZOLAM 0.5 MG PO TABS: 0.5000 mg | ORAL_TABLET | Freq: Three times a day (TID) | ORAL | 0 refills | Status: DC | PRN

## 2019-07-15 MED ORDER — DICLOFENAC SODIUM 75 MG PO TBEC: 75.0000 mg | DELAYED_RELEASE_TABLET | Freq: Two times a day (BID) | ORAL | 3 refills | Status: DC

## 2019-07-15 NOTE — Progress Notes (Signed)
Subjective:    Patient ID: Taylor Taylor, female    DOB: December 17, 1945, 74 y.o.   MRN: [id number]  HPI Patient is a very pleasant 74 year old Caucasian female who presents today for complete physical exam.  Her mammogram was last performed in July and was normal.  She has no history of abnormal Pap smears and therefore does not require Pap smear.  Her last colonoscopy was in 2018 and therefore is up-to-date.  Her last bone density test was last year and was within normal limit.  She she denies any depression, falls, or memory loss.  She does report occasional panic attacks at night when she lays down to go to sleep.  These are characterized by feeling like she cannot breathe through her nose, feeling like she is smothering.  When she gets to sleep she is fine.  She denies any angina or shortness of breath during the day.  She denies any tachycardia or palpitations.  Lab on 07/04/2019  Component Date Value Ref Range Status  . Cholesterol 07/04/2019 201* <200 mg/dL Final  . HDL 07/04/2019 93  > OR = 50 mg/dL Final  . Triglycerides 07/04/2019 41  <150 mg/dL Final  . LDL Cholesterol (Calc) 07/04/2019 96  mg/dL (calc) Final   Comment: Reference range: <100 . Desirable range <100 mg/dL for primary prevention;   <70 mg/dL for patients with CHD or diabetic patients  with > or = 2 CHD risk factors. Marland Kitchen LDL-C is now calculated using the Martin-Hopkins  calculation, which is a validated novel method providing  better accuracy than the Friedewald equation in the  estimation of LDL-C.  Cresenciano Genre et al. Annamaria Helling. [id number]): 2061-2068  (http://education.[facility].com/faq/FAQ164)   . Total CHOL/HDL Ratio 07/04/2019 2.2  <5.0 (calc) Final  . Non-HDL Cholesterol (Calc) 07/04/2019 108  <130 mg/dL (calc) Final   Comment: For patients with diabetes plus 1 major ASCVD risk  factor, treating to a non-HDL-C goal of <100 mg/dL  (LDL-C of <70 mg/dL) is considered a therapeutic  option.   . Glucose, Bld  07/04/2019 93  65 - 99 mg/dL Final   Comment: .            Fasting reference interval .   . BUN 07/04/2019 16  7 - 25 mg/dL Final  . Creat 07/04/2019 0.85  0.60 - 0.93 mg/dL Final   Comment: For patients >7 years of age, the reference limit for Creatinine is approximately 13% higher for people identified as African-American. .   Havery Moros Ratio 123XX123 NOT APPLICABLE  6 - 22 (calc) Final  . Sodium 07/04/2019 137  135 - 146 mmol/L Final  . Potassium 07/04/2019 4.5  3.5 - 5.3 mmol/L Final  . Chloride 07/04/2019 101  98 - 110 mmol/L Final  . CO2 07/04/2019 28  20 - 32 mmol/L Final  . Calcium 07/04/2019 9.6  8.6 - 10.4 mg/dL Final  . Total Protein 07/04/2019 6.6  6.1 - 8.1 g/dL Final  . Albumin 07/04/2019 4.4  3.6 - 5.1 g/dL Final  . Globulin 07/04/2019 2.2  1.9 - 3.7 g/dL (calc) Final  . AG Ratio 07/04/2019 2.0  1.0 - 2.5 (calc) Final  . Total Bilirubin 07/04/2019 0.5  0.2 - 1.2 mg/dL Final  . Alkaline phosphatase (APISO) 07/04/2019 33* 37 - 153 U/L Final  . AST 07/04/2019 18  10 - 35 U/L Final  . ALT 07/04/2019 14  6 - 29 U/L Final  . WBC 07/04/2019 8.1  3.8 - 10.8 Thousand/uL Final  .  RBC 07/04/2019 4.38  3.80 - 5.10 Million/uL Final  . Hemoglobin 07/04/2019 13.5  11.7 - 15.5 g/dL Final  . HCT 07/04/2019 41.4  35.0 - 45.0 % Final  . MCV 07/04/2019 94.5  80.0 - 100.0 fL Final  . MCH 07/04/2019 30.8  27.0 - 33.0 pg Final  . MCHC 07/04/2019 32.6  32.0 - 36.0 g/dL Final  . RDW 07/04/2019 12.4  11.0 - 15.0 % Final  . Platelets 07/04/2019 296  140 - 400 Thousand/uL Final  . MPV 07/04/2019 9.5  7.5 - 12.5 fL Final  . Neutro Abs 07/04/2019 4,139  1,500 - 7,800 cells/uL Final  . Lymphs Abs 07/04/2019 2,876  850 - 3,900 cells/uL Final  . Absolute Monocytes 07/04/2019 818  200 - 950 cells/uL Final  . Eosinophils Absolute 07/04/2019 219  15 - 500 cells/uL Final  . Basophils Absolute 07/04/2019 49  0 - 200 cells/uL Final  . Neutrophils Relative % 07/04/2019 51.1  % Final  .  Total Lymphocyte 07/04/2019 35.5  % Final  . Monocytes Relative 07/04/2019 10.1  % Final  . Eosinophils Relative 07/04/2019 2.7  % Final  . Basophils Relative 07/04/2019 0.6  % Final    Past Medical History:  Diagnosis Date  . Allergy    Seasonal  . Arthritis    back  . GERD (gastroesophageal reflux disease)    in past   Past Surgical History:  Procedure Laterality Date  . CERVICAL DISC SURGERY     over 10 years  . COLONOSCOPY     Current Outpatient Medications on File Prior to Visit  Medication Sig Dispense Refill  . acetaminophen (TYLENOL) 500 MG tablet Take 500 mg by mouth 2 (two) times daily.    Marland Kitchen estradiol (ESTRACE) 0.5 MG tablet Take 0.5 mg by mouth daily.     . ferrous sulfate 325 (65 FE) MG tablet Take 325 mg by mouth daily with breakfast.    . Fish Oil-Cholecalciferol (FISH OIL + D3) 1200-1000 MG-UNIT CAPS Take 1 tablet by mouth daily.    . Glucosamine-Chondroit-Vit C-Mn (GLUCOSAMINE CHONDR 1500 COMPLX) CAPS Take 1 capsule by mouth 2 (two) times daily.    . Melatonin 10 MG CAPS Take 10 mg by mouth at bedtime as needed (sleep).     . Multiple Vitamins-Minerals (WOMENS MULTI VITAMIN & MINERAL PO) Take 1 tablet by mouth daily.    . progesterone (PROMETRIUM) 100 MG capsule Take 1 capsule (100 mg total) by mouth daily. 90 capsule 3  . Red Yeast Rice 600 MG CAPS Take 600 mg by mouth daily.     . Turmeric 500 MG TABS Take 500 mg by mouth daily.     . vitamin B-12 (CYANOCOBALAMIN) 100 MCG tablet Take 100 mcg by mouth daily.    . vitamin E 100 UNIT capsule Take 100 Units by mouth daily.     Current Facility-Administered Medications on File Prior to Visit  Medication Dose Route Frequency Provider Last Rate Last Admin  . 0.9 %  sodium chloride infusion  500 mL Intravenous Continuous Pyrtle, Lajuan Lines, MD       Allergies  Allergen Reactions  . Pollen Extract Itching    sneezing   Social History   Socioeconomic History  . Marital status: Married    Spouse name: Not on file    . Number of children: Not on file  . Years of education: Not on file  . Highest education level: Not on file  Occupational History  . Not on  file  Tobacco Use  . Smoking status: Never Smoker  . Smokeless tobacco: Never Used  Substance and Sexual Activity  . Alcohol use: No  . Drug use: No  . Sexual activity: Not on file  Other Topics Concern  . Not on file  Social History Narrative  . Not on file   Social Determinants of Health   Financial Resource Strain:   . Difficulty of Paying Living Expenses:   Food Insecurity:   . Worried About Charity fundraiser in the Last Year:   . Arboriculturist in the Last Year:   Transportation Needs:   . Film/video editor (Medical):   Marland Kitchen Lack of Transportation (Non-Medical):   Physical Activity:   . Days of Exercise per Week:   . Minutes of Exercise per Session:   Stress:   . Feeling of Stress :   Social Connections:   . Frequency of Communication with Friends and Family:   . Frequency of Social Gatherings with Friends and Family:   . Attends Religious Services:   . Active Member of Clubs or Organizations:   . Attends Archivist Meetings:   Marland Kitchen Marital Status:   Intimate Partner Violence:   . Fear of Current or Ex-Partner:   . Emotionally Abused:   Marland Kitchen Physically Abused:   . Sexually Abused:       Review of Systems  All other systems reviewed and are negative.      Objective:   Physical Exam  Constitutional: She is oriented to person, place, and time. She appears well-developed and well-nourished. No distress.  HENT:  Head: Normocephalic and atraumatic.  Right Ear: External ear normal.  Left Ear: External ear normal.  Nose: Nose normal.  Mouth/Throat: Oropharynx is clear and moist. No oropharyngeal exudate.  Eyes: Pupils are equal, round, and reactive to light. Conjunctivae and EOM are normal.  Neck: No JVD present. No tracheal deviation present. No thyromegaly present.  Cardiovascular: Normal rate, regular  rhythm and normal heart sounds. Exam reveals no gallop and no friction rub.  No murmur heard. Pulmonary/Chest: Effort normal and breath sounds normal. No respiratory distress. She has no wheezes. She has no rales. She exhibits no tenderness.  Abdominal: Soft. Bowel sounds are normal. She exhibits no distension and no mass. There is no abdominal tenderness. There is no rebound and no guarding.  Musculoskeletal:        General: No tenderness. Normal range of motion.     Cervical back: Neck supple.  Lymphadenopathy:    She has no cervical adenopathy.  Neurological: She is alert and oriented to person, place, and time. She displays normal reflexes. No cranial nerve deficit. She exhibits normal muscle tone. Coordination normal.  Skin: No rash noted. She is not diaphoretic.  Vitals reviewed.         Assessment & Plan:  General medical exam  Patient's labs are excellent.  Immunizations are up to date.  Cancer screening is up to date.  Continue to encourage her to wean off HRT.  We will try Xanax 0.5 mg p.o. nightly as needed panic attacks to see if this helps.  I encouraged the patient to use this sparingly to avoid dependency or habituation.  Patient denies any falls, memory loss, or depression.

## 2019-08-13 ENCOUNTER — Other Ambulatory Visit: Payer: Self-pay | Admitting: Family Medicine

## 2019-08-13 DIAGNOSIS — Z1231 Encounter for screening mammogram for malignant neoplasm of breast: Secondary | ICD-10-CM

## 2019-09-26 ENCOUNTER — Ambulatory Visit: Payer: PPO | Admitting: Orthopaedic Surgery

## 2019-09-26 ENCOUNTER — Ambulatory Visit (INDEPENDENT_AMBULATORY_CARE_PROVIDER_SITE_OTHER): Payer: PPO

## 2019-09-26 ENCOUNTER — Other Ambulatory Visit: Payer: Self-pay

## 2019-09-26 ENCOUNTER — Encounter: Payer: Self-pay | Admitting: Orthopaedic Surgery

## 2019-09-26 VITALS — Ht 63.0 in | Wt 125.0 lb

## 2019-09-26 DIAGNOSIS — G8929 Other chronic pain: Secondary | ICD-10-CM | POA: Diagnosis not present

## 2019-09-26 DIAGNOSIS — M25561 Pain in right knee: Secondary | ICD-10-CM | POA: Diagnosis not present

## 2019-09-26 DIAGNOSIS — M25551 Pain in right hip: Secondary | ICD-10-CM | POA: Diagnosis not present

## 2019-09-26 DIAGNOSIS — M170 Bilateral primary osteoarthritis of knee: Secondary | ICD-10-CM | POA: Insufficient documentation

## 2019-09-26 MED ORDER — METHYLPREDNISOLONE ACETATE 40 MG/ML IJ SUSP
80.0000 mg | INTRAMUSCULAR | Status: AC | PRN
  Administered 2019-09-26: 80 mg via INTRA_ARTICULAR

## 2019-09-26 MED ORDER — BUPIVACAINE HCL 0.5 % IJ SOLN
2.0000 mL | INTRAMUSCULAR | Status: AC | PRN
  Administered 2019-09-26: 2 mL via INTRA_ARTICULAR

## 2019-09-26 MED ORDER — LIDOCAINE HCL 1 % IJ SOLN
2.0000 mL | INTRAMUSCULAR | Status: AC | PRN
  Administered 2019-09-26: 2 mL

## 2019-09-26 NOTE — Progress Notes (Signed)
Office Visit Note   [name]           Date of Birth: [birth year]           MRN: [id number] Visit Date: 09/26/2019              Requested by: [name], MD [address] [place],  [place] [address] PCP: [name], MD   Assessment & Plan: Visit Diagnoses:  1. Chronic pain of right knee   2. Bilateral primary osteoarthritis of knee     Plan: Bethena Roys has been to the office a number of occasions for problems referable to her right hip.  She has had an MRI scan demonstrating some peritendinitis as well as some mild degenerative changes about both hips I have injected the area over the greater trochanter on several occasions with good relief.  Presently she is having a problem with her right knee and does not feel like it is referred from her hip.  She has had some difficulty getting up and around from a sitting position with symptoms consistent with arthritis.  Her films demonstrate some mild to moderate changes in the patella and the medial compartment.  I am going to inject the right knee with cortisone today and monitor her response  Follow-Up Instructions: Return if symptoms worsen or fail to improve.   Orders:  Orders Placed This Encounter  Procedures  . Large Joint Inj: R knee  . XR KNEE 3 VIEW RIGHT   No orders of the defined types were placed in this encounter.     Procedures: Large Joint Inj: R knee on 09/26/2019 2:41 PM Indications: pain and diagnostic evaluation Details: 25 G 1.5 in needle, anteromedial approach  Arthrogram: No  Medications: 2 mL lidocaine 1 %; 2 mL bupivacaine 0.5 %; 80 mg methylPREDNISolone acetate 40 MG/ML Procedure, treatment alternatives, risks and benefits explained, specific risks discussed. Consent was given by the patient. Immediately prior to procedure a time out was called to verify the correct patient, procedure, equipment, support staff and site/side marked as required. Patient was prepped and draped in the  usual sterile fashion.       Clinical Data: No additional findings.   Subjective: Chief Complaint  Patient presents with  . Right Knee - Pain  Patient presents today for right knee pain. She said that she went to the beach last week and started to have pain in her knee. She said that it hurts all over. She states that she has been favoring it when she walks. No swelling, giving way, or grinding. She takes Diclofenac and Tylenol for pain.  Presently not having much trouble with her right hip.  She has had a prior MRI scan revealing some peritendinitis and mild degenerative changes about the hip joint.  She is also had an MRI scan of her lumbar spine performed several years ago demonstrating areas of disc space narrowing and and arthritis.  There is always a possibility that some of her pain could be referred.  Presently not having too much trouble with her back or radiculopathy  HPI  Review of Systems  Constitutional: Negative for fatigue.  HENT: Negative for facial swelling.   Eyes: Negative for pain.  Respiratory: Negative for shortness of breath.   Cardiovascular: Negative for leg swelling.  Gastrointestinal: Negative for constipation and diarrhea.  Endocrine: Negative for cold intolerance and heat intolerance.  Genitourinary: Negative for difficulty urinating.  Musculoskeletal: Negative for joint swelling.  Skin: Negative for rash.  Allergic/Immunologic: Negative for food allergies.  Neurological: Negative for weakness.  Psychiatric/Behavioral: Positive for sleep disturbance.     Objective: Vital Signs: Ht 5\' 3"  (1.6 m)   Wt 125 lb (56.7 kg)   BMI 22.14 kg/m   Physical Exam Constitutional:      Appearance: She is well-developed.  Eyes:     Pupils: Pupils are equal, round, and reactive to light.  Pulmonary:     Effort: Pulmonary effort is normal.  Skin:    General: Skin is warm and dry.  Neurological:     Mental Status: She is alert and oriented to person, place,  and time.  Psychiatric:        Behavior: Behavior normal.     Ortho Exam awake alert and oriented x3.  Comfortable sitting.  Straight leg raise negative.  Very minimal discomfort over the lateral aspect of her right hip and no pain with range of motion.  Has some patellar crepitation right knee but no pain with complete compression of the patella.  Mild medial joint pain.  None laterally.  Full extension.  No effusion or evidence of instability.  No popliteal pain or mass and no calf pain  Specialty Comments:  No specialty comments available.  Imaging: XR KNEE 3 VIEW RIGHT  Result Date: 09/26/2019 Films of the right knee are obtained in several projections standing.  There is just slight decrease in the medial joint space with some subchondral sclerosis.  Faint calcification within the menisci which might be CPPD.  There are some degenerative changes about the patella but it does track in the midline.  No obvious changes laterally.  Films are consistent with moderate osteoarthritis    PMFS History: Patient Active Problem List   Diagnosis Date Noted  . Bilateral primary osteoarthritis of knee 09/26/2019  . Pain in right hip 04/30/2019  . Neck pain on right side 12/25/2018  . Pain in right shoulder 12/11/2018  . Pain in right hand 05/18/2018  . Pain in left hand 05/18/2018  . Unilateral primary osteoarthritis, right knee 08/24/2016  . Hypertriglyceridemia without hypercholesterolemia 09/03/2013  . CHEST PAIN-PRECORDIAL 08/31/2009  . CHEST PAIN, ATYPICAL 08/28/2009   Past Medical History:  Diagnosis Date  . Allergy    Seasonal  . Arthritis    back  . GERD (gastroesophageal reflux disease)    in past    Family History  Problem Relation Age of Onset  . Parkinson's disease Mother   . Brain cancer Father   . Breast cancer Neg Hx     Past Surgical History:  Procedure Laterality Date  . CERVICAL DISC SURGERY     over 10 years  . COLONOSCOPY     Social History    Occupational History  . Not on file  Tobacco Use  . Smoking status: Never Smoker  . Smokeless tobacco: Never Used  Vaping Use  . Vaping Use: Never used  Substance and Sexual Activity  . Alcohol use: No  . Drug use: No  . Sexual activity: Not on file

## 2019-10-01 ENCOUNTER — Other Ambulatory Visit: Payer: Self-pay

## 2019-10-01 ENCOUNTER — Ambulatory Visit
Admission: RE | Admit: 2019-10-01 | Discharge: 2019-10-01 | Disposition: A | Discharge status: Home / Self Care | Payer: PPO | Source: Ambulatory Visit | Attending: Family Medicine | Admitting: Family Medicine

## 2019-10-01 DIAGNOSIS — Z1231 Encounter for screening mammogram for malignant neoplasm of breast: Secondary | ICD-10-CM | POA: Diagnosis not present

## 2019-11-21 ENCOUNTER — Telehealth: Payer: Self-pay | Admitting: Family Medicine

## 2019-11-21 NOTE — Progress Notes (Signed)
  Chronic Care Management   Note  11/21/2019 Name: Taylor Taylor MRN: [id number] DOB: [birth year]  Taylor Taylor is a 74 y.o. year old female who is a primary care patient of [name], MD. I reached out to Rubin Payor by phone today in response to a referral sent by Taylor Taylor's PCP, [name], MD.   Taylor Taylor was given information about Chronic Care Management services today including:  1. CCM service includes personalized support from designated clinical staff supervised by her physician, including individualized plan of care and coordination with other care providers 2. 24/7 contact phone numbers for assistance for urgent and routine care needs. 3. Service will only be billed when office clinical staff spend 20 minutes or more in a month to coordinate care. 4. Only one practitioner may furnish and bill the service in a calendar month. 5. The patient may stop CCM services at any time (effective at the end of the month) by phone call to the office staff.   Patient agreed to services and verbal consent obtained.   Follow up plan:   Carley Perdue [facility] Sched[facility]

## 2019-11-28 ENCOUNTER — Encounter: Payer: Self-pay | Admitting: Orthopaedic Surgery

## 2019-11-28 ENCOUNTER — Ambulatory Visit: Payer: PPO | Admitting: Orthopaedic Surgery

## 2019-11-28 ENCOUNTER — Other Ambulatory Visit: Payer: Self-pay

## 2019-11-28 VITALS — Ht 63.0 in | Wt 125.0 lb

## 2019-11-28 DIAGNOSIS — M1711 Unilateral primary osteoarthritis, right knee: Secondary | ICD-10-CM | POA: Diagnosis not present

## 2019-11-28 MED ORDER — BUPIVACAINE HCL 0.5 % IJ SOLN
2.0000 mL | INTRAMUSCULAR | Status: AC | PRN
  Administered 2019-11-28: 2 mL via INTRA_ARTICULAR

## 2019-11-28 MED ORDER — METHYLPREDNISOLONE ACETATE 40 MG/ML IJ SUSP
80.0000 mg | INTRAMUSCULAR | Status: AC | PRN
  Administered 2019-11-28: 80 mg via INTRA_ARTICULAR

## 2019-11-28 MED ORDER — LIDOCAINE HCL 1 % IJ SOLN
2.0000 mL | INTRAMUSCULAR | Status: AC | PRN
  Administered 2019-11-28: 2 mL

## 2019-11-28 NOTE — Progress Notes (Signed)
Office Visit Note   [name]           Date of Birth: [birth year]           MRN: [id number] Visit Date: 11/28/2019              Requested by: [name], MD [address] [facility] [place],  [place] [address] PCP: [name], MD   Assessment & Plan: Visit Diagnoses:  1. Unilateral primary osteoarthritis, right knee     Plan: Taylor Taylor is experiencing pain along the lateral aspect of her right thigh with 2 localized areas of tenderness about the greater trochanter of the hip and the lateral knee joint.  She has had prior cortisone injections over the greater trochanter with the possibility of a bursitis and had a cortisone injection 2 months ago along the medial aspect of her right knee.  She had evidence of patellofemoral and medial joint arthritis.  The lateral joint appeared to be clear but she does hurt directly over the lateral joint.  From a diagnostic and therapeutic standpoint) inject the lateral aspect of her right knee and evaluate and monitor her response.  Pain may be referred from a more proximal location  Follow-Up Instructions: Return if symptoms worsen or fail to improve.   Orders:  Orders Placed This Encounter  Procedures   Large Joint Inj: R knee   No orders of the defined types were placed in this encounter.     Procedures: Large Joint Inj: R knee on 11/28/2019 3:04 PM Indications: pain and diagnostic evaluation Details: 25 G 1.5 in needle, anteromedial approach  Arthrogram: No  Medications: 2 mL lidocaine 1 %; 2 mL bupivacaine 0.5 %; 80 mg methylPREDNISolone acetate 40 MG/ML Procedure, treatment alternatives, risks and benefits explained, specific risks discussed. Consent was given by the patient. Immediately prior to procedure a time out was called to verify the correct patient, procedure, equipment, support staff and site/side marked as required. Patient was prepped and draped in the usual sterile fashion.       Clinical  Data: No additional findings.   Subjective: Chief Complaint  Patient presents with   Right Knee - Pain  Patient presents today for recurrent right knee pain. She was last here on 09/26/2019 and received a cortisone injection. She states that the pain returned about 3 weeks ago. She is wanting to get another cortisone injection today. She also states that she has noticed pain down her right leg at night if she lays on the right side. She takes Voltaren daily.  Also seen in March of this year for lateral right hip pain.  I injected the greater trochanter.  Films of her hip revealed minimal arthritis.  She does have some arthritic changes of her lumbar spine is also a possibility of origin of her pain  HPI  Review of Systems  Constitutional: Negative for fatigue.  HENT: Negative for ear pain.   Eyes: Negative for pain.  Respiratory: Negative for shortness of breath.   Cardiovascular: Negative for leg swelling.  Gastrointestinal: Negative for constipation and diarrhea.  Endocrine: Negative for cold intolerance and heat intolerance.  Genitourinary: Negative for difficulty urinating.  Musculoskeletal: Negative for joint swelling.  Skin: Negative for rash.  Allergic/Immunologic: Negative for food allergies.  Neurological: Negative for weakness.  Hematological: Does not bruise/bleed easily.  Psychiatric/Behavioral: Negative for sleep disturbance.     Objective: Vital Signs: Ht 5\' 3"  (1.6 m)    Wt 125 lb (  56.7 kg)    BMI 22.14 kg/m   Physical Exam Constitutional:      Appearance: She is well-developed.  Eyes:     Pupils: Pupils are equal, round, and reactive to light.  Pulmonary:     Effort: Pulmonary effort is normal.  Skin:    General: Skin is warm and dry.  Neurological:     Mental Status: She is alert and oriented to person, place, and time.  Psychiatric:        Behavior: Behavior normal.     Ortho Exam awake alert and oriented x3.  Comfortable sitting.  Straight leg raise  negative.  Painless range of motion right hip with internal and external rotation.  Does have some tenderness over the greater trochanter of her right hip but without crepitation.  Also having pain directly over the lateral aspect of her right knee more anteriorly than posteriorly.  Does have palpable osteophyte or bony protuberance of the tibia at the level of the lateral knee joint.  No effusion.  Full extension of flexed over 100 degrees without instability.  No pain along the IT band  Specialty Comments:  No specialty comments available.  Imaging: No results found.   PMFS History: Patient Active Problem List   Diagnosis Date Noted   Bilateral primary osteoarthritis of knee 09/26/2019   Pain in right hip 04/30/2019   Neck pain on right side 12/25/2018   Pain in right shoulder 12/11/2018   Pain in right hand 05/18/2018   Pain in left hand 05/18/2018   Unilateral primary osteoarthritis, right knee 08/24/2016   Hypertriglyceridemia without hypercholesterolemia 09/03/2013   CHEST PAIN-PRECORDIAL 08/31/2009   CHEST PAIN, ATYPICAL 08/28/2009   Past Medical History:  Diagnosis Date   Allergy    Seasonal   Arthritis    back   GERD (gastroesophageal reflux disease)    in past    Family History  Problem Relation Age of Onset   Parkinson's disease Mother    Brain cancer Father    Breast cancer Neg Hx     Past Surgical History:  Procedure Laterality Date   CERVICAL DISC SURGERY     over 10 years   COLONOSCOPY     Social History   Occupational History   Not on file  Tobacco Use   Smoking status: Never Smoker   Smokeless tobacco: Never Used  Vaping Use   Vaping Use: Never used  Substance and Sexual Activity   Alcohol use: No   Drug use: No   Sexual activity: Not on file

## 2019-12-18 DIAGNOSIS — Z01419 Encounter for gynecological examination (general) (routine) without abnormal findings: Secondary | ICD-10-CM | POA: Diagnosis not present

## 2019-12-30 NOTE — Chronic Care Management (AMB) (Addendum)
 Chronic Care Management Pharmacy  Name: Taylor Taylor  MRN: [id number] DOB: 01/27/1946  Chief Complaint/ HPI  Taylor Taylor,  74 y.o. , female presents for their Initial CCM visit with the clinical pharmacist In office.  PCP : Pickard, Warren T, MD  Their chronic conditions include: Osteoarthritis, Hypertriglyceridemia.  Office Visits: 07/15/2019 (Pickard) -  Occasional panic attacks at night when she goes to sleep, cannot breathe through her nose causing anxiety She was given Xanax 0.5mg prn at bedtime for sleep  Consult Visit: 11/28/2019 ([place], ortho) Pain in R thigh, given cortisone injection   Medications: Outpatient Encounter Medications as of 01/02/2020  Medication Sig   acetaminophen (TYLENOL) 500 MG tablet Take 500 mg by mouth 2 (two) times daily.   ALPRAZolam (XANAX) 0.5 MG tablet Take 1 tablet (0.5 mg total) by mouth 3 (three) times daily as needed.   diclofenac (VOLTAREN) 75 MG EC tablet Take 1 tablet (75 mg total) by mouth 2 (two) times daily.   estradiol (ESTRACE) 0.5 MG tablet Take 0.5 mg by mouth daily.    ferrous sulfate 325 (65 FE) MG tablet Take 325 mg by mouth daily with breakfast.   Fish Oil-Cholecalciferol (FISH OIL + D3) 1200-1000 MG-UNIT CAPS Take 1 tablet by mouth daily.   Glucosamine-Chondroit-Vit C-Mn (GLUCOSAMINE CHONDR 1500 COMPLX) CAPS Take 1 capsule by mouth 2 (two) times daily.   Melatonin 10 MG CAPS Take 10 mg by mouth at bedtime as needed (sleep).    Multiple Vitamins-Minerals (WOMENS MULTI VITAMIN & MINERAL PO) Take 1 tablet by mouth daily.   progesterone (PROMETRIUM) 100 MG capsule Take 1 capsule (100 mg total) by mouth daily.   Red Yeast Rice 600 MG CAPS Take 600 mg by mouth daily.    Turmeric 500 MG TABS Take 500 mg by mouth daily.    vitamin B-12 (CYANOCOBALAMIN) 100 MCG tablet Take 100 mcg by mouth daily.   vitamin E 100 UNIT capsule Take 100 Units by mouth daily.   Facility-Administered Encounter Medications as of  01/02/2020  Medication   0.9 %  sodium chloride infusion     Current Diagnosis/Assessment:   Financial Resource Strain: Low Risk    Difficulty of Paying Living Expenses: Not hard at all    Goals Addressed             This Visit's Progress    Pharmacy Care Plan:       CARE PLAN ENTRY (see longitudinal plan of care for additional care plan information)  Current Barriers:  Chronic Disease Management support, education, and care coordination needs related to Hyperlipidemia and Osteoarthritis    Hyperlipidemia Lab Results  Component Value Date/Time   LDLCALC 96 07/04/2019 08:30 AM   Pharmacist Clinical Goal(s): Over the next 180 days, patient will work with PharmD and providers to maintain LDL goal < 100 Current regimen:  Red yeast rice Fish oil Interventions: Comprehensive medication review Counseled on cholesterol goals Patient self care activities - Over the next 180 days, patient will: Continue to take OTC medications as directed Continue diet low in saturated fats and sweets  Osteoarthritis Pharmacist Clinical Goal(s) Over the next 180 days, patient will work with PharmD and providers to optimize medication and minimize symptoms of Osteoarthritis Current regimen:  Diclofenac 75mg daily Tylenol 500mg as needed Interventions: Discussed avoidance of other NSAIDs Comprehensive OTC medication review Patient self care activities - Over the next 180 days, patient will: Continue to take medications as directed Contact providers with change or worsening of   symptoms  Medication management Pharmacist Clinical Goal(s): Over the next 180 days, patient will work with PharmD and providers to maintain optimal medication adherence Current pharmacy: [facility] [address] Interventions Comprehensive medication review performed. [place] for medication synchronization and delivery Patient self care activities - Over the next 180 days, patient will: Focus on  medication adherence by pill count Take medications as prescribed Report any questions or concerns to PharmD and/or provider(s)  Initial goal documentation         Hyperlipidemia   LDL goal < 100  Lipid Panel     Component Value Date/Time   CHOL 201 (H) 07/04/2019 0830   TRIG 41 07/04/2019 0830   HDL 93 07/04/2019 0830   LDLCALC 96 07/04/2019 0830    Hepatic Function Latest Ref Rng & Units 07/04/2019 11/05/2018 12/15/2017  Total Protein 6.1 - 8.1 g/dL 6.6 6.6 6.4  Albumin 3.6 - 5.1 g/dL - - -  AST 10 - 35 U/L _0 ALT 6 - 29 U/L _1 Alk Phosphatase 33 - 130 U/L - - -  Total Bilirubin 0.2 - 1.2 mg/dL 0.5 0.3 0.4     The 10-year ASCVD risk score Mikey Bussing DC Jr., et al., 2013) is: 9.5%   Values used to calculate the score:     Age: 52 years     Sex: Female     Is Non-Hispanic African American: No     Diabetic: No     Tobacco smoker: No     Systolic Blood Pressure: 876 mmHg     Is BP treated: No     HDL Cholesterol: 93 mg/dL     Total Cholesterol: 201 mg/dL   Patient has failed these meds in past: none noted Patient is currently controlled on the following medications:  Red Yeast Rice 673m Fish Oil  We discussed:  Her LDL is WNL, TC is just slightly above goal  No prescription medication at this time No concerns at this time  Plan  Continue current medications   Osteoarthritis   Patient has failed these meds in past: none noted Patient is currently controlled on the following medications:  Diclofenac 71mdaily Tylenol 50087mrn  We discussed:   Only taking one Voltaren tablet per day, per Dr. PicDennard Schaumannstructions Denies abnormal bruising or bleeding  Plan  Continue current medications Vaccines   Reviewed and discussed patient's vaccination history.    Immunization History  Administered Date(s) Administered   Fluad Quad(high Dose 65+) 12/06/2018   Hepatitis A 12/10/1997, 09/17/1998   Hepatitis B 12/10/1997, 01/09/1998, 08/11/1998    Influenza Split 01/18/2012   Influenza, High Dose Seasonal PF 12/22/2015, 12/15/2017   Influenza,inj,Quad PF,6+ Mos 01/01/2013, 01/07/2014, 12/30/2014   PFIZER SARS-COV-2 Vaccination 04/12/2019, 05/02/2019   Pneumococcal Conjugate-13 07/17/2014   Pneumococcal Polysaccharide-23 07/12/2011   Td 12/09/1997   Tdap 07/12/2011   Zoster 02/26/2010   Zoster Recombinat (Shingrix) 08/12/2016   She just revieved her COVID-19 booster and has gotten her flu shot already this year.   Plan  Vaccine status up to date. Medication Management   Miscellaneous medications:  Estradiol 0.5mg42mogesterone 100mg31m's:  MVI Turmeric Melatonin Cetirizine 10mg 81me was taking this during the day and complaining of feeling tired, recommended she try taking this at bedtime for a few weeks to see if that helps. Glucosamine Vitamin B-12 [facility]  Phone #  [phone] reports using no specific method to organize medications and  promote adherence. Patient denies missed doses of medication.  Verbal consent obtained for [facility] enhanced pharmacy services (medication synchronization, adherence packaging, delivery coordination). A medication sync plan was created to allow patient to get all medications delivered once every 30 to 90 days per patient preference. Patient understands they have freedom to choose pharmacy and clinical pharmacist will coordinate care between all prescribers and [facility].    , PharmD Clinical Pharmacist [facility] [phone]  I have collaborated with the care management provider regarding care management and care coordination activities outlined in this encounter and have reviewed this encounter including documentation in the note and care plan. I am certifying that I agree with the content of this note and encounter as supervising physician.  

## 2019-12-31 DIAGNOSIS — D1801 Hemangioma of skin and subcutaneous tissue: Secondary | ICD-10-CM | POA: Diagnosis not present

## 2019-12-31 DIAGNOSIS — L814 Other melanin hyperpigmentation: Secondary | ICD-10-CM | POA: Diagnosis not present

## 2019-12-31 DIAGNOSIS — L821 Other seborrheic keratosis: Secondary | ICD-10-CM | POA: Diagnosis not present

## 2019-12-31 DIAGNOSIS — H04121 Dry eye syndrome of right lacrimal gland: Secondary | ICD-10-CM | POA: Diagnosis not present

## 2019-12-31 DIAGNOSIS — D225 Melanocytic nevi of trunk: Secondary | ICD-10-CM | POA: Diagnosis not present

## 2019-12-31 DIAGNOSIS — H2513 Age-related nuclear cataract, bilateral: Secondary | ICD-10-CM | POA: Diagnosis not present

## 2020-01-01 ENCOUNTER — Other Ambulatory Visit: Payer: Self-pay | Admitting: *Deleted

## 2020-01-01 ENCOUNTER — Telehealth: Payer: Self-pay | Admitting: Pharmacist

## 2020-01-01 DIAGNOSIS — Z9189 Other specified personal risk factors, not elsewhere classified: Secondary | ICD-10-CM

## 2020-01-01 DIAGNOSIS — R260 Ataxic gait: Secondary | ICD-10-CM

## 2020-01-01 DIAGNOSIS — G3281 Cerebellar ataxia in diseases classified elsewhere: Secondary | ICD-10-CM

## 2020-01-01 DIAGNOSIS — H9193 Unspecified hearing loss, bilateral: Secondary | ICD-10-CM

## 2020-01-01 DIAGNOSIS — Z780 Asymptomatic menopausal state: Secondary | ICD-10-CM

## 2020-01-01 NOTE — Progress Notes (Signed)
    Chronic Care Management Pharmacy Assistant   Name: Taylor Taylor  MRN: [id number] DOB: 1945-05-21  Reason for Encounter: Initial Questions   PCP : [name], MD  Allergies:   Allergies  Allergen Reactions  . Pollen Extract Itching    sneezing    Medications: Outpatient Encounter Medications as of 01/01/2020  Medication Sig  . acetaminophen (TYLENOL) 500 MG tablet Take 500 mg by mouth 2 (two) times daily.  Marland Kitchen ALPRAZolam (XANAX) 0.5 MG tablet Take 1 tablet (0.5 mg total) by mouth 3 (three) times daily as needed.  . diclofenac (VOLTAREN) 75 MG EC tablet Take 1 tablet (75 mg total) by mouth 2 (two) times daily.  Marland Kitchen estradiol (ESTRACE) 0.5 MG tablet Take 0.5 mg by mouth daily.   . ferrous sulfate 325 (65 FE) MG tablet Take 325 mg by mouth daily with breakfast.  . Fish Oil-Cholecalciferol (FISH OIL + D3) 1200-1000 MG-UNIT CAPS Take 1 tablet by mouth daily.  . Glucosamine-Chondroit-Vit C-Mn (GLUCOSAMINE CHONDR 1500 COMPLX) CAPS Take 1 capsule by mouth 2 (two) times daily.  . Melatonin 10 MG CAPS Take 10 mg by mouth at bedtime as needed (sleep).   . Multiple Vitamins-Minerals (WOMENS MULTI VITAMIN & MINERAL PO) Take 1 tablet by mouth daily.  . progesterone (PROMETRIUM) 100 MG capsule Take 1 capsule (100 mg total) by mouth daily.  . Red Yeast Rice 600 MG CAPS Take 600 mg by mouth daily.   . Turmeric 500 MG TABS Take 500 mg by mouth daily.   . vitamin B-12 (CYANOCOBALAMIN) 100 MCG tablet Take 100 mcg by mouth daily.  . vitamin E 100 UNIT capsule Take 100 Units by mouth daily.   Facility-Administered Encounter Medications as of 01/01/2020  Medication  . 0.9 %  sodium chloride infusion    Current Diagnosis: Patient Active Problem List   Diagnosis Date Noted  . Bilateral primary osteoarthritis of knee 09/26/2019  . Pain in right hip 04/30/2019  . Neck pain on right side 12/25/2018  . Pain in right shoulder 12/11/2018  . Pain in right hand 05/18/2018  . Pain in left  hand 05/18/2018  . Unilateral primary osteoarthritis, right knee 08/24/2016  . Hypertriglyceridemia without hypercholesterolemia 09/03/2013  . CHEST PAIN-PRECORDIAL 08/31/2009  . CHEST PAIN, ATYPICAL 08/28/2009    Goals Addressed   None     Have you seen any other providers since your last visit? No   Any changes in your medications or health? No   Any side effects from any medications? Not that she is aware of   Do you have an symptoms or problems not managed by your medications? None   Any concerns about your health right now? No   Has your provider asked that you check blood pressure, blood sugar, or follow special diet at home? No   Do you get any type of exercise on a regular basis? Yes, patient states she goes to the [facility] a few   Can you think of a goal you would like to reach for your health? Patient would like to lose some weight    Do you have any problems getting your medications? No   Is there anything that you would like to discuss during the appointment? None at this time  Reminded the patient to please bring medications and supplements to appointment Thursday October 14th at 9:00am in the office.   Follow-Up:  Pharmacist Review   Fanny Skates, [place] Pharmacist Assistant [phone]

## 2020-01-02 ENCOUNTER — Other Ambulatory Visit: Payer: Self-pay

## 2020-01-02 ENCOUNTER — Ambulatory Visit: Payer: PPO | Admitting: Pharmacist

## 2020-01-02 DIAGNOSIS — M170 Bilateral primary osteoarthritis of knee: Secondary | ICD-10-CM

## 2020-01-02 DIAGNOSIS — E781 Pure hyperglyceridemia: Secondary | ICD-10-CM

## 2020-01-02 NOTE — Patient Instructions (Addendum)
Visit Information Thank you for meeting with me today!  I look forward to working with you to help you meet all of your healthcare goals and answer any questions you may have.  Feel free to contact me anytime!  Goals Addressed            This Visit's Progress   . Pharmacy Care Plan:       CARE PLAN ENTRY (see longitudinal plan of care for additional care plan information)  Current Barriers:  . Chronic Disease Management support, education, and care coordination needs related to Hyperlipidemia and Osteoarthritis    Hyperlipidemia Lab Results  Component Value Date/Time   LDLCALC 96 07/04/2019 08:30 AM   . Pharmacist Clinical Goal(s): o Over the next 180 days, patient will work with PharmD and providers to maintain LDL goal < 100 . Current regimen:  o Red yeast rice o Fish oil . Interventions: o Comprehensive medication review o Counseled on cholesterol goals . Patient self care activities - Over the next 180 days, patient will: o Continue to take OTC medications as directed o Continue diet low in saturated fats and sweets  Osteoarthritis . Pharmacist Clinical Goal(s) o Over the next 180 days, patient will work with PharmD and providers to optimize medication and minimize symptoms of Osteoarthritis . Current regimen:  o Diclofenac 75mg  daily o Tylenol 500mg  as needed . Interventions: o Discussed avoidance of other NSAIDs o Comprehensive OTC medication review . Patient self care activities - Over the next 180 days, patient will: o Continue to take medications as directed o Contact providers with change or worsening of symptoms  Medication management . Pharmacist Clinical Goal(s): o Over the next 180 days, patient will work with PharmD and providers to maintain optimal medication adherence . Current pharmacy: [facility] . Interventions o Comprehensive medication review performed. o [employer] for medication synchronization and delivery . Patient  self care activities - Over the next 180 days, patient will: o Focus on medication adherence by pill count o Take medications as prescribed o Report any questions or concerns to PharmD and/or provider(s)  Initial goal documentation        Taylor Taylor was given information about Chronic Care Management services today including:  1. CCM service includes personalized support from designated clinical staff supervised by her physician, including individualized plan of care and coordination with other care providers 2. 24/7 contact phone numbers for assistance for urgent and routine care needs. 3. Standard insurance, coinsurance, copays and deductibles apply for chronic care management only during months in which we provide at least 20 minutes of these services. Most insurances cover these services at 100%, however patients may be responsible for any copay, coinsurance and/or deductible if applicable. This service may help you avoid the need for more expensive face-to-face services. 4. Only one practitioner may furnish and bill the service in a calendar month. 5. The patient may stop CCM services at any time (effective at the end of the month) by phone call to the office staff.  Patient agreed to services and verbal consent obtained.   The patient verbalized understanding of instructions provided today and agreed to receive a mailed copy of patient instruction and/or educational materials. Telephone follow up appointment with pharmacy team member scheduled for: 6 months  Beverly Milch, PharmD Clinical Pharmacist [facility] [phone]   Osteoarthritis  Osteoarthritis is a type of arthritis that affects tissue that covers the ends of bones in joints (cartilage). Cartilage  acts as a cushion between the bones and helps them move smoothly. Osteoarthritis results when cartilage in the joints gets worn down. Osteoarthritis is sometimes called "wear and tear"  arthritis. Osteoarthritis is the most common form of arthritis. It often occurs in older people. It is a condition that gets worse over time (a progressive condition). Joints that are most often affected by this condition are in:  Fingers.  Toes.  Hips.  Knees.  Spine, including neck and lower back. What are the causes? This condition is caused by age-related wearing down of cartilage that covers the ends of bones. What increases the risk? The following factors may make you more likely to develop this condition:  Older age.  Being overweight or obese.  Overuse of joints, such as in athletes.  Past injury of a joint.  Past surgery on a joint.  Family history of osteoarthritis. What are the signs or symptoms? The main symptoms of this condition are pain, swelling, and stiffness in the joint. The joint may lose its shape over time. Small pieces of bone or cartilage may break off and float inside of the joint, which may cause more pain and damage to the joint. Small deposits of bone (osteophytes) may grow on the edges of the joint. Other symptoms may include:  A grating or scraping feeling inside the joint when you move it.  Popping or creaking sounds when you move. Symptoms may affect one or more joints. Osteoarthritis in a major joint, such as your knee or hip, can make it painful to walk or exercise. If you have osteoarthritis in your hands, you might not be able to grip items, twist your hand, or control small movements of your hands and fingers (fine motor skills). How is this diagnosed? This condition may be diagnosed based on:  Your medical history.  A physical exam.  Your symptoms.  X-rays of the affected joint(s).  Blood tests to rule out other types of arthritis. How is this treated? There is no cure for this condition, but treatment can help to control pain and improve joint function. Treatment plans may include:  A prescribed exercise program that allows for  rest and joint relief. You may work with a physical therapist.  A weight control plan.  Pain relief techniques, such as: ? Applying heat and cold to the joint. ? Electric pulses delivered to nerve endings under the skin (transcutaneous electrical nerve stimulation, or TENS). ? Massage. ? Certain nutritional supplements.  NSAIDs or prescription medicines to help relieve pain.  Medicine to help relieve pain and inflammation (corticosteroids). This can be given by mouth (orally) or as an injection.  Assistive devices, such as a brace, wrap, splint, specialized glove, or cane.  Surgery, such as: ? An osteotomy. This is done to reposition the bones and relieve pain or to remove loose pieces of bone and cartilage. ? Joint replacement surgery. You may need this surgery if you have very bad (advanced) osteoarthritis. Follow these instructions at home: Activity  Rest your affected joints as directed by your health care provider.  Do not drive or use heavy machinery while taking prescription pain medicine.  Exercise as directed. Your health care provider or physical therapist may recommend specific types of exercise, such as: ? Strengthening exercises. These are done to strengthen the muscles that support joints that are affected by arthritis. They can be performed with weights or with exercise bands to add resistance. ? Aerobic activities. These are exercises, such as brisk walking or  water aerobics, that get your heart pumping. ? Range-of-motion activities. These keep your joints easy to move. ? Balance and agility exercises. Managing pain, stiffness, and swelling      If directed, apply heat to the affected area as often as told by your health care provider. Use the heat source that your health care provider recommends, such as a moist heat pack or a heating pad. ? If you have a removable assistive device, remove it as told by your health care provider. ? Place a towel between your  skin and the heat source. If your health care provider tells you to keep the assistive device on while you apply heat, place a towel between the assistive device and the heat source. ? Leave the heat on for 20-30 minutes. ? Remove the heat if your skin turns bright red. This is especially important if you are unable to feel pain, heat, or cold. You may have a greater risk of getting burned.  If directed, put ice on the affected joint: ? If you have a removable assistive device, remove it as told by your health care provider. ? Put ice in a plastic bag. ? Place a towel between your skin and the bag. If your health care provider tells you to keep the assistive device on during icing, place a towel between the assistive device and the bag. ? Leave the ice on for 20 minutes, 2-3 times a day. General instructions  Take over-the-counter and prescription medicines only as told by your health care provider.  Maintain a healthy weight. Follow instructions from your health care provider for weight control. These may include dietary restrictions.  Do not use any products that contain nicotine or tobacco, such as cigarettes and e-cigarettes. These can delay bone healing. If you need help quitting, ask your health care provider.  Use assistive devices as directed by your health care provider.  Keep all follow-up visits as told by your health care provider. This is important. Where to find more information  [employer] of Arthritis and Musculoskeletal and Skin Diseases: www.niams.SouthExposed.es  [employer] on Aging: http://kim-miller.com/  American College of Rheumatology: www.rheumatology.org Contact a health care provider if:  Your skin turns red.  You develop a rash.  You have pain that gets worse.  You have a fever along with joint or muscle aches. Get help right away if:  You lose a lot of weight.  You suddenly lose your appetite.  You have night sweats. Summary  Osteoarthritis  is a type of arthritis that affects tissue covering the ends of bones in joints (cartilage).  This condition is caused by age-related wearing down of cartilage that covers the ends of bones.  The main symptom of this condition is pain, swelling, and stiffness in the joint.  There is no cure for this condition, but treatment can help to control pain and improve joint function. This information is not intended to replace advice given to you by your health care provider. Make sure you discuss any questions you have with your health care provider. Document Revised: 02/17/2017 Document Reviewed: 11/09/2015 Elsevier Patient Education  2020 [employer].

## 2020-01-15 ENCOUNTER — Other Ambulatory Visit: Payer: Self-pay

## 2020-01-15 ENCOUNTER — Ambulatory Visit: Payer: PPO | Admitting: Orthopaedic Surgery

## 2020-01-15 ENCOUNTER — Encounter: Payer: Self-pay | Admitting: Orthopaedic Surgery

## 2020-01-15 VITALS — Ht 63.0 in | Wt 125.0 lb

## 2020-01-15 DIAGNOSIS — M79641 Pain in right hand: Secondary | ICD-10-CM | POA: Diagnosis not present

## 2020-01-15 DIAGNOSIS — M25551 Pain in right hip: Secondary | ICD-10-CM | POA: Diagnosis not present

## 2020-01-15 MED ORDER — LIDOCAINE HCL 1 % IJ SOLN
0.5000 mL | INTRAMUSCULAR | Status: AC | PRN
  Administered 2020-01-15: .5 mL

## 2020-01-15 MED ORDER — BUPIVACAINE HCL 0.5 % IJ SOLN
2.0000 mL | INTRAMUSCULAR | Status: AC | PRN
  Administered 2020-01-15: 2 mL via INTRA_ARTICULAR

## 2020-01-15 MED ORDER — LIDOCAINE HCL 1 % IJ SOLN
2.0000 mL | INTRAMUSCULAR | Status: AC | PRN
  Administered 2020-01-15: 2 mL

## 2020-01-15 NOTE — Progress Notes (Signed)
Office Visit Note   [name]           Date of Birth: [birth year]           MRN: [id number] Visit Date: 01/15/2020              Requested by: [name], MD [id number] [facility] [place],  [place] [address] PCP: [name], MD   Assessment & Plan: Visit Diagnoses:  1. Pain in right hip   2. Pain in right hand     Plan: Taylor Taylor is experiencing recurrent pain over the greater trochanter of her right hip.  She is done well with local cortisone injection in that area in the past and would like to have another injection.  She does have some evidence of osteoarthritis in the hip joint which may be referring the pain but because she said good relief in the past will inject the greater trochanteric area.  In addition she does have arthritis at the base of both thumbs more on the right than the left and will inject that with betamethasone.  We tried a splint but it was uncomfortable we will see her back as needed  Follow-Up Instructions: Return if symptoms worsen or fail to improve.   Orders:  Orders Placed This Encounter  Procedures  . Large Joint Inj: R greater trochanter  . Hand/UE Inj: R thumb CMC   No orders of the defined types were placed in this encounter.     Procedures: Large Joint Inj: R greater trochanter on 01/15/2020 12:00 PM Indications: pain and diagnostic evaluation Details: 25 G 1.5 in needle  Arthrogram: No  Medications: 2 mL lidocaine 1 %; 2 mL bupivacaine 0.5 %  2 mL betamethasone Procedure, treatment alternatives, risks and benefits explained, specific risks discussed. Consent was given by the patient. Immediately prior to procedure a time out was called to verify the correct patient, procedure, equipment, support staff and site/side marked as required. Patient was prepped and draped in the usual sterile fashion.   Hand/UE Inj: R thumb CMC for osteoarthritis on 01/15/2020 12:01 PM Medications: 0.5 mL lidocaine 1 %  1/2 mL  betamethasone      Clinical Data: No additional findings.   Subjective: Chief Complaint  Patient presents with  . Right Hip - Pain  Recurrent symptoms of greater trochanteric bursitis right hip.  Has had cortisone in the past with good relief has some evidence of arthritis of her right hip by prior films which might be causing her discomfort.  No numbness or tingling referred pain distally.  Also having pain at the base of both thumbs with evidence of arthritis  HPI  Review of Systems   Objective: Vital Signs: Ht 5\' 3"  (1.6 m)   Wt 125 lb (56.7 kg)   BMI 22.14 kg/m   Physical Exam Constitutional:      Appearance: She is well-developed.  Eyes:     Pupils: Pupils are equal, round, and reactive to light.  Pulmonary:     Effort: Pulmonary effort is normal.  Skin:    General: Skin is warm and dry.  Neurological:     Mental Status: She is alert and oriented to person, place, and time.  Psychiatric:        Behavior: Behavior normal.     Ortho Exam awake alert and oriented x3.  Comfortable sitting.  Examination of the right hip demonstrates pain directly over the greater trochanter.  No skin changes  or crepitation.  Some minimal discomfort with range of motion of her right hip but not referred to the greater trochanter.  No distal edema.  Motor exam intact.  Has evidence of arthritis at the base of both thumbs.  There is some subluxation more on the right than the left with a positive grind test weakened grip related to the arthritis and the subluxation but neurologically intact  Specialty Comments:  No specialty comments available.  Imaging: No results found.   PMFS History: Patient Active Problem List   Diagnosis Date Noted  . Bilateral primary osteoarthritis of knee 09/26/2019  . Pain in right hip 04/30/2019  . Neck pain on right side 12/25/2018  . Pain in right shoulder 12/11/2018  . Pain in right hand 05/18/2018  . Pain in left hand 05/18/2018  .  Unilateral primary osteoarthritis, right knee 08/24/2016  . Hypertriglyceridemia without hypercholesterolemia 09/03/2013  . CHEST PAIN-PRECORDIAL 08/31/2009  . CHEST PAIN, ATYPICAL 08/28/2009   Past Medical History:  Diagnosis Date  . Allergy    Seasonal  . Arthritis    back  . GERD (gastroesophageal reflux disease)    in past    Family History  Problem Relation Age of Onset  . Parkinson's disease Mother   . Brain cancer Father   . Breast cancer Neg Hx     Past Surgical History:  Procedure Laterality Date  . CERVICAL DISC SURGERY     over 10 years  . COLONOSCOPY     Social History   Occupational History  . Not on file  Tobacco Use  . Smoking status: Never Smoker  . Smokeless tobacco: Never Used  Vaping Use  . Vaping Use: Never used  Substance and Sexual Activity  . Alcohol use: No  . Drug use: No  . Sexual activity: Not on file     Garald Balding, MD   Note - This record has been created using [employer].  Chart creation errors have been sought, but may not always  have been located. Such creation errors do not reflect on  the standard of medical care.

## 2020-01-15 NOTE — Progress Notes (Deleted)
   Procedure Note  [name]             Date of Birth: 01/18/1946           MRN: [id number]             Visit Date: 01/15/2020  Procedures: Visit Diagnoses:  1. Pain in right hip   2. Pain in right hand     Large Joint Inj: R greater trochanter on 01/15/2020 9:55 AM Indications: pain and diagnostic evaluation Details: 25 G 1.5 in needle  Arthrogram: No  Medications: 2 mL lidocaine 1 %; 2 mL bupivacaine 0.5 %  2 mL betamethasone Procedure, treatment alternatives, risks and benefits explained, specific risks discussed. Consent was given by the patient. Immediately prior to procedure a time out was called to verify the correct patient, procedure, equipment, support staff and site/side marked as required. Patient was prepped and draped in the usual sterile fashion.   Hand/UE Inj: R thumb CMC for osteoarthritis on 01/15/2020 9:56 AM Medications: 1 mL lidocaine 1 %  1/2 mL betamethasone

## 2020-01-17 ENCOUNTER — Ambulatory Visit: Payer: Self-pay | Admitting: Pharmacist

## 2020-01-17 NOTE — Chronic Care Management (AMB) (Addendum)
°  Chronic Care Management   Follow Up Note   01/17/2020 Name: Taylor Taylor MRN: [id number] DOB: May 25, 1945  Referred by: [name], MD Reason for referral : No chief complaint on file.   Taylor Taylor is a 74 y.o. year old female who is a primary care patient of Pickard, Cammie Mcgee, MD. The CCM team was consulted for assistance with chronic disease management and care coordination needs.    Review of patient status, including review of consultants reports, relevant laboratory and other test results, and collaboration with appropriate care team members and the patient's provider was performed as part of comprehensive patient evaluation and provision of chronic care management services.    SDOH (Social Determinants of Health) assessments performed: No See Care Plan activities for detailed interventions related to [facility])      Outpatient Encounter Medications as of 01/17/2020  Medication Sig   acetaminophen (TYLENOL) 500 MG tablet Take 500 mg by mouth 2 (two) times daily.   ALPRAZolam (XANAX) 0.5 MG tablet Take 1 tablet (0.5 mg total) by mouth 3 (three) times daily as needed.   diclofenac (VOLTAREN) 75 MG EC tablet Take 1 tablet (75 mg total) by mouth 2 (two) times daily.   estradiol (ESTRACE) 0.5 MG tablet Take 0.5 mg by mouth daily.    ferrous sulfate 325 (65 FE) MG tablet Take 325 mg by mouth daily with breakfast.   Fish Oil-Cholecalciferol (FISH OIL + D3) 1200-1000 MG-UNIT CAPS Take 1 tablet by mouth daily.   Glucosamine-Chondroit-Vit C-Mn (GLUCOSAMINE CHONDR 1500 COMPLX) CAPS Take 1 capsule by mouth 2 (two) times daily.   Melatonin 10 MG CAPS Take 10 mg by mouth at bedtime as needed (sleep).    Multiple Vitamins-Minerals (WOMENS MULTI VITAMIN & MINERAL PO) Take 1 tablet by mouth daily.   progesterone (PROMETRIUM) 100 MG capsule Take 1 capsule (100 mg total) by mouth daily.   Red Yeast Rice 600 MG CAPS Take 600 mg by mouth daily.    Turmeric 500 MG TABS Take 500 mg  by mouth daily.    vitamin B-12 (CYANOCOBALAMIN) 100 MCG tablet Take 100 mcg by mouth daily.   vitamin E 100 UNIT capsule Take 100 Units by mouth daily.   Facility-Administered Encounter Medications as of 01/17/2020  Medication   0.9 %  sodium chloride infusion     Objective:  Analyze care gaps for medicare and adherence for STAR measures.  Goals Addressed   None   Care gaps (1) - AWV not performed  Adherence - patient does not take any medication measured for adherence data.  [facility] and delivery with first delivery scheduled to be in January 2022.     Plan:   The patient has been provided with contact information for the care management team and has been advised to call with any health related questions or concerns.    Beverly Milch, PharmD Clinical Pharmacist [facility] [phone]   I have collaborated with the care management provider regarding care management and care coordination activities outlined in this encounter and have reviewed this encounter including documentation in the note and care plan. I am certifying that I agree with the content of this note and encounter as supervising physician.

## 2020-01-30 ENCOUNTER — Other Ambulatory Visit: Payer: Self-pay | Admitting: Family Medicine

## 2020-02-22 DIAGNOSIS — H1132 Conjunctival hemorrhage, left eye: Secondary | ICD-10-CM | POA: Diagnosis not present

## 2020-02-24 ENCOUNTER — Ambulatory Visit: Payer: PPO | Admitting: Family Medicine

## 2020-03-02 ENCOUNTER — Ambulatory Visit (INDEPENDENT_AMBULATORY_CARE_PROVIDER_SITE_OTHER): Payer: PPO | Admitting: Family Medicine

## 2020-03-02 ENCOUNTER — Other Ambulatory Visit: Payer: Self-pay

## 2020-03-02 VITALS — BP 130/66 | HR 67 | Temp 98.1°F | Ht 63.0 in | Wt 132.0 lb

## 2020-03-02 DIAGNOSIS — R0600 Dyspnea, unspecified: Secondary | ICD-10-CM

## 2020-03-02 DIAGNOSIS — R0609 Other forms of dyspnea: Secondary | ICD-10-CM

## 2020-03-02 DIAGNOSIS — R5382 Chronic fatigue, unspecified: Secondary | ICD-10-CM

## 2020-03-02 NOTE — Progress Notes (Signed)
Subjective:    Patient ID: Taylor Taylor, female    DOB: 06/09/1945, 74 y.o.   MRN: [id number]  HPI  10/04/18 Patient is a very pleasant 74 year old Caucasian female here for unusual sensation in the head.  She states that over the last 6 months she has had episodes where she will come extremely dizzy.  She will have to pause for a moment until the symptoms pass.  This Monday it occurred 3 times.  Each time it lasted less than 10 minutes.  There is no spinning of the room.  It is a lightheadedness and a feeling of disequilibrium.  Occasionally she will stagger when walking and she feels unsteady on her feet.  She denies any true vertigo.  She denies any hearing loss.  She denies any tinnitus.  She denies any orthostatic hypotension.  She denies any syncope or presyncope.  She denies any other neurologic deficit.  Cranial nerves II through XII are grossly intact with muscle strength 5/5 equal and symmetric in the upper and lower extremities.  Romberg test is normal.  However on heel-to-toe test, the patient staggers and falls into the wall twice while walking and had to catch herself her balance.  This is grossly abnormal for this patient.  Her Dix-Hallpike maneuver was negative on both sides.  At that time, my plan was: History is uncertain.  I believe most likely patient may be having episodes of mild vertigo however her exam today significant only for some mild ataxia on heel-to-toe walking in the hallway.  I will arrange an MRI to evaluate the cerebellum further.  Patient denies any syncope or near syncope.  11/05/18 MRI Brain: Cerebral volume is within normal limits for age. No restricted diffusion to suggest acute infarction. No midline shift, mass effect, evidence of mass lesion, ventriculomegaly, extra-axial collection or acute intracranial hemorrhage. Cervicomedullary junction and pituitary are within normal limits.  Pearline Cables and white matter signal is within normal limits for  age throughout the brain. No cortical encephalomalacia or definite chronic blood products. The deep gray nuclei, brainstem and cerebellum are normal for age.  No abnormal enhancement identified.  No dural thickening.  Vascular: Major intracranial vascular flow voids are preserved. Mild intracranial artery tortuosity. The left vertebral artery appears dominant. The major dural venous sinuses are enhancing and appear to be patent.  Skull and upper cervical spine: Negative visible cervical spine. Visualized bone marrow signal is within normal limits.  Sinuses/Orbits: Normal orbits.  Paranasal sinuses are clear.  Other: Mastoids are clear aside from trace fluid on the left which appears inconsequential (series 3, image 6). The other visible bilateral internal auditory structures appear normal. Normal stylomastoid foramina. Scalp and face soft tissues appear negative.  Patient is relieved that her MRI is normal however she states that she occasionally still feels like she drifts to the left when she walks.  She denies falling in the walls.  She denies tripping over objects.  However she states that she does feel more clumsy than normal.  She questions if she may be hypervigilant and making something out of nothing.  She denies dropping objects.  She denies knocking over stationary objects.  She denies falling.  She denies tripping going up or down steps.  She denies muscle weakness.  She denies muscle spasticity.  She denies trouble breathing.  She denies diplopia.  She denies muscle fatigue.  She denies loss of coordination.  She denies trouble swallowing.  She denies numbness or tingling in her extremities.  She denies weakness in her extremities.  Cranial nerves II through XII are grossly intact today.  Muscle strength is 5/5 equal and symmetric in the upper and lower extremities.  She has normal 2/4 reflexes checked at the biceps, brachioradialis, triceps, patella, and the Achilles.   Romberg testing is normal.  Finger-to-nose testing is normal.  Heel-to-shin testing is normal.  Patient has normal gait.  There is no evidence of ataxia in the hallway.  She is able to walk heel-to-toe without any difficulty.  There is no contralateral hip drop while standing on one leg at a time.  03/02/20 Wt Readings from Last 3 Encounters:  03/02/20 132 lb (59.9 kg)  01/15/20 125 lb (56.7 kg)  11/28/19 125 lb (56.7 kg)  Patient presents with several months of fatigue.  She states that she wakes up every morning and goes to the gym.  She gets home around lunchtime and she just has no energy the rest of the day.  She has to force herself to go the gym.  She describes it is more of a physical tired rather than a hypersomnolence.  She also reports increasing shortness of breath and dyspnea on exertion.  However she denies any chest pain or palpitations or syncope.  She denies any cough or hemoptysis or pleurisy.  She denies any nausea or vomiting or diarrhea.  She denies any melena or hematochezia or vaginal bleeding.  She denies any hematuria or dysuria.  She denies any muscle pains or joint pains or rashes.  She does report some depression and anhedonia.  She denies any insomnia.  She does report decreased energy.  She denies any suicidal ideation.  She denies any poor concentration.  She questions if depression could be causing some of her fatigue  Past Medical History:  Diagnosis Date  . Allergy    Seasonal  . Arthritis    back  . GERD (gastroesophageal reflux disease)    in past   Past Surgical History:  Procedure Laterality Date  . CERVICAL DISC SURGERY     over 10 years  . COLONOSCOPY     Current Outpatient Medications on File Prior to Visit  Medication Sig Dispense Refill  . acetaminophen (TYLENOL) 500 MG tablet Take 500 mg by mouth 2 (two) times daily.    Marland Kitchen ALPRAZolam (XANAX) 0.5 MG tablet Take 1 tablet (0.5 mg total) by mouth 3 (three) times daily as needed. 30 tablet 0  .  diclofenac (VOLTAREN) 75 MG EC tablet TAKE 1 TABLET BY MOUTH TWICE A DAY 90 tablet 3  . estradiol (ESTRACE) 0.5 MG tablet Take 0.5 mg by mouth daily.     . ferrous sulfate 325 (65 FE) MG tablet Take 325 mg by mouth daily with breakfast.    . Fish Oil-Cholecalciferol (FISH OIL + D3) 1200-1000 MG-UNIT CAPS Take 1 tablet by mouth daily.    . Glucosamine-Chondroit-Vit C-Mn (GLUCOSAMINE CHONDR 1500 COMPLX) CAPS Take 1 capsule by mouth 2 (two) times daily.    . Melatonin 10 MG CAPS Take 10 mg by mouth at bedtime as needed (sleep).     . Multiple Vitamins-Minerals (WOMENS MULTI VITAMIN & MINERAL PO) Take 1 tablet by mouth daily.    . progesterone (PROMETRIUM) 100 MG capsule Take 1 capsule (100 mg total) by mouth daily. 90 capsule 3  . Red Yeast Rice 600 MG CAPS Take 600 mg by mouth daily.     . Turmeric 500 MG TABS Take 500 mg by mouth daily.     Marland Kitchen  vitamin B-12 (CYANOCOBALAMIN) 100 MCG tablet Take 100 mcg by mouth daily.    . vitamin E 100 UNIT capsule Take 100 Units by mouth daily.     Current Facility-Administered Medications on File Prior to Visit  Medication Dose Route Frequency Provider Last Rate Last Admin  . 0.9 %  sodium chloride infusion  500 mL Intravenous Continuous Pyrtle, Lajuan Lines, MD       Allergies  Allergen Reactions  . Pollen Extract Itching    sneezing   Social History   Socioeconomic History  . Marital status: Married    Spouse name: Not on file  . Number of children: Not on file  . Years of education: Not on file  . Highest education level: Not on file  Occupational History  . Not on file  Tobacco Use  . Smoking status: Never Smoker  . Smokeless tobacco: Never Used  Vaping Use  . Vaping Use: Never used  Substance and Sexual Activity  . Alcohol use: No  . Drug use: No  . Sexual activity: Not on file  Other Topics Concern  . Not on file  Social History Narrative  . Not on file   Social Determinants of Health   Financial Resource Strain: Low Risk   . Difficulty  of Paying Living Expenses: Not hard at all  Food Insecurity: Not on file  Transportation Needs: Not on file  Physical Activity: Not on file  Stress: Not on file  Social Connections: Not on file  Intimate Partner Violence: Not on file    Past Medical History:  Diagnosis Date  . Allergy    Seasonal  . Arthritis    back  . GERD (gastroesophageal reflux disease)    in past   Past Surgical History:  Procedure Laterality Date  . CERVICAL DISC SURGERY     over 10 years  . COLONOSCOPY     Current Outpatient Medications on File Prior to Visit  Medication Sig Dispense Refill  . acetaminophen (TYLENOL) 500 MG tablet Take 500 mg by mouth 2 (two) times daily.    Marland Kitchen ALPRAZolam (XANAX) 0.5 MG tablet Take 1 tablet (0.5 mg total) by mouth 3 (three) times daily as needed. 30 tablet 0  . diclofenac (VOLTAREN) 75 MG EC tablet TAKE 1 TABLET BY MOUTH TWICE A DAY 90 tablet 3  . estradiol (ESTRACE) 0.5 MG tablet Take 0.5 mg by mouth daily.     . ferrous sulfate 325 (65 FE) MG tablet Take 325 mg by mouth daily with breakfast.    . Fish Oil-Cholecalciferol (FISH OIL + D3) 1200-1000 MG-UNIT CAPS Take 1 tablet by mouth daily.    . Glucosamine-Chondroit-Vit C-Mn (GLUCOSAMINE CHONDR 1500 COMPLX) CAPS Take 1 capsule by mouth 2 (two) times daily.    . Melatonin 10 MG CAPS Take 10 mg by mouth at bedtime as needed (sleep).     . Multiple Vitamins-Minerals (WOMENS MULTI VITAMIN & MINERAL PO) Take 1 tablet by mouth daily.    . progesterone (PROMETRIUM) 100 MG capsule Take 1 capsule (100 mg total) by mouth daily. 90 capsule 3  . Red Yeast Rice 600 MG CAPS Take 600 mg by mouth daily.     . Turmeric 500 MG TABS Take 500 mg by mouth daily.     . vitamin B-12 (CYANOCOBALAMIN) 100 MCG tablet Take 100 mcg by mouth daily.    . vitamin E 100 UNIT capsule Take 100 Units by mouth daily.     Current Facility-Administered Medications on File  Prior to Visit  Medication Dose Route Frequency Provider Last Rate Last Admin  .  0.9 %  sodium chloride infusion  500 mL Intravenous Continuous Pyrtle, Lajuan Lines, MD       Allergies  Allergen Reactions  . Pollen Extract Itching    sneezing   Social History   Socioeconomic History  . Marital status: Married    Spouse name: Not on file  . Number of children: Not on file  . Years of education: Not on file  . Highest education level: Not on file  Occupational History  . Not on file  Tobacco Use  . Smoking status: Never Smoker  . Smokeless tobacco: Never Used  Vaping Use  . Vaping Use: Never used  Substance and Sexual Activity  . Alcohol use: No  . Drug use: No  . Sexual activity: Not on file  Other Topics Concern  . Not on file  Social History Narrative  . Not on file   Social Determinants of Health   Financial Resource Strain: Low Risk   . Difficulty of Paying Living Expenses: Not hard at all  Food Insecurity: Not on file  Transportation Needs: Not on file  Physical Activity: Not on file  Stress: Not on file  Social Connections: Not on file  Intimate Partner Violence: Not on file      Review of Systems  All other systems reviewed and are negative.      Objective:   Physical Exam Vitals reviewed.  Constitutional:      General: She is not in acute distress.    Appearance: She is well-developed. She is not diaphoretic.  HENT:     Head: Normocephalic and atraumatic.     Right Ear: External ear normal.     Left Ear: External ear normal.     Nose: Nose normal.     Mouth/Throat:     Pharynx: No oropharyngeal exudate.  Eyes:     Conjunctiva/sclera: Conjunctivae normal.     Pupils: Pupils are equal, round, and reactive to light.  Neck:     Thyroid: No thyromegaly.     Vascular: No JVD.     Trachea: No tracheal deviation.  Cardiovascular:     Rate and Rhythm: Normal rate and regular rhythm.     Heart sounds: Normal heart sounds. No murmur heard. No friction rub. No gallop.   Pulmonary:     Effort: Pulmonary effort is normal. No  respiratory distress.     Breath sounds: Normal breath sounds. No wheezing or rales.  Chest:     Chest wall: No tenderness.  Abdominal:     General: Bowel sounds are normal. There is no distension.     Palpations: Abdomen is soft. There is no mass.     Tenderness: There is no abdominal tenderness. There is no guarding or rebound.  Musculoskeletal:        General: No tenderness. Normal range of motion.     Cervical back: Neck supple.  Lymphadenopathy:     Cervical: No cervical adenopathy.  Skin:    Findings: No rash.  Neurological:     Mental Status: She is alert and oriented to person, place, and time.     Cranial Nerves: No cranial nerve deficit.     Motor: No abnormal muscle tone.     Coordination: Coordination normal.     Deep Tendon Reflexes: Reflexes normal.           Assessment & Plan:  Chronic fatigue -  Plan: CBC with Differential/Platelet, COMPLETE METABOLIC PANEL WITH GFR, CK, TSH, Sedimentation rate  Dyspnea on exertion - Plan: ECHOCARDIOGRAM COMPLETE  Her exam today is completely normal.  She has presented previously a year ago with fatigue and at that time work-up was unremarkable however I will gladly check a CBC, CMP, CK, TSH, sedimentation rate.  Given her worsening dyspnea on exertion, I will check an echocardiogram.  She does report becoming easily winded simply climbing a flight of stairs which is unusual for her.  However assuming that the echocardiogram is normal, I will try empiric therapy with venlafaxine extended release 75 mg p.o. every morning and then gradually increasing to 150 mg p.o. every morning to see if depression could be causing her fatigue

## 2020-03-03 LAB — CBC WITH DIFFERENTIAL/PLATELET
Absolute Monocytes: 591 cells/uL (ref 200–950)
Basophils Absolute: 51 cells/uL (ref 0–200)
Basophils Relative: 0.7 %
Eosinophils Absolute: 168 cells/uL (ref 15–500)
Eosinophils Relative: 2.3 %
HCT: 38.4 % (ref 35.0–45.0)
Hemoglobin: 13 g/dL (ref 11.7–15.5)
Lymphs Abs: 2745 cells/uL (ref 850–3900)
MCH: 31.2 pg (ref 27.0–33.0)
MCHC: 33.9 g/dL (ref 32.0–36.0)
MCV: 92.1 fL (ref 80.0–100.0)
MPV: 10 fL (ref 7.5–12.5)
Monocytes Relative: 8.1 %
Neutro Abs: 3745 cells/uL (ref 1500–7800)
Neutrophils Relative %: 51.3 %
Platelets: 301 10*3/uL (ref 140–400)
RBC: 4.17 10*6/uL (ref 3.80–5.10)
RDW: 12.2 % (ref 11.0–15.0)
Total Lymphocyte: 37.6 %
WBC: 7.3 10*3/uL (ref 3.8–10.8)

## 2020-03-03 LAB — COMPLETE METABOLIC PANEL WITH GFR
AG Ratio: 1.7 (calc) (ref 1.0–2.5)
ALT: 10 U/L (ref 6–29)
AST: 15 U/L (ref 10–35)
Albumin: 4.3 g/dL (ref 3.6–5.1)
Alkaline phosphatase (APISO): 33 U/L — ABNORMAL LOW (ref 37–153)
BUN/Creatinine Ratio: 32 (calc) — ABNORMAL HIGH (ref 6–22)
BUN: 27 mg/dL — ABNORMAL HIGH (ref 7–25)
CO2: 28 mmol/L (ref 20–32)
Calcium: 10.2 mg/dL (ref 8.6–10.4)
Chloride: 104 mmol/L (ref 98–110)
Creat: 0.85 mg/dL (ref 0.60–0.93)
GFR, Est African American: 79 mL/min/{1.73_m2} (ref >=60)
GFR, Est Non African American: 68 mL/min/{1.73_m2} (ref >=60)
Globulin: 2.5 g/dL (calc) (ref 1.9–3.7)
Glucose, Bld: 92 mg/dL (ref 65–99)
Potassium: 5.3 mmol/L (ref 3.5–5.3)
Sodium: 138 mmol/L (ref 135–146)
Total Bilirubin: 0.3 mg/dL (ref 0.2–1.2)
Total Protein: 6.8 g/dL (ref 6.1–8.1)

## 2020-03-03 LAB — CK: Total CK: 67 U/L (ref 29–143)

## 2020-03-03 LAB — SEDIMENTATION RATE: Sed Rate: 2 mm/h (ref 0–30)

## 2020-03-03 LAB — TSH: TSH: 1.86 mIU/L (ref 0.40–4.50)

## 2020-03-04 ENCOUNTER — Encounter: Payer: Self-pay | Admitting: Family Medicine

## 2020-03-05 ENCOUNTER — Other Ambulatory Visit: Payer: Self-pay | Admitting: *Deleted

## 2020-03-05 MED ORDER — VENLAFAXINE HCL ER 75 MG PO CP24: ORAL_CAPSULE | ORAL | 0 refills | Status: DC

## 2020-03-05 MED ORDER — VENLAFAXINE HCL ER 150 MG PO CP24
150.0000 mg | ORAL_CAPSULE | Freq: Every day | ORAL | 1 refills | Status: DC
Start: 2020-04-05 — End: 2020-03-09

## 2020-03-09 ENCOUNTER — Other Ambulatory Visit: Payer: Self-pay

## 2020-03-09 ENCOUNTER — Ambulatory Visit: Payer: Self-pay | Admitting: Pharmacist

## 2020-03-09 MED ORDER — VENLAFAXINE HCL ER 150 MG PO CP24: 150.0000 mg | ORAL_CAPSULE | Freq: Every day | ORAL | 1 refills | Status: DC

## 2020-03-09 NOTE — Chronic Care Management (AMB) (Signed)
Chronic Care Management   Follow Up Note   03/09/2020 Name: Taylor Taylor MRN: [id number] DOB: 05/25/1945  Referred by: Taylor Frizzle, MD Reason for referral : No chief complaint on file.   Taylor Taylor is a 74 y.o. year old female who is a primary care patient of Pickard, Cammie Mcgee, MD. The CCM team was consulted for assistance with chronic disease management and care coordination needs.    Review of patient status, including review of consultants reports, relevant laboratory and other test results, and collaboration with appropriate care team members and the patient's provider was performed as part of comprehensive patient evaluation and provision of chronic care management services.    SDOH (Social Determinants of Health) assessments performed: No See Care Plan activities for detailed interventions related to [name]     Outpatient Encounter Medications as of 03/09/2020  Medication Sig  . acetaminophen (TYLENOL) 500 MG tablet Take 500 mg by mouth 2 (two) times daily.  Marland Kitchen ALPRAZolam (XANAX) 0.5 MG tablet Take 1 tablet (0.5 mg total) by mouth 3 (three) times daily as needed.  . cholecalciferol (VITAMIN D3) 25 MCG (1000 UNIT) tablet Take 125 mcg by mouth daily.  . diclofenac (VOLTAREN) 75 MG EC tablet TAKE 1 TABLET BY MOUTH TWICE A DAY  . estradiol (ESTRACE) 0.5 MG tablet Take 0.5 mg by mouth daily.   . ferrous sulfate 325 (65 FE) MG tablet Take 325 mg by mouth daily with breakfast.  . Fish Oil-Cholecalciferol (FISH OIL + D3) 1200-1000 MG-UNIT CAPS Take 1 tablet by mouth daily.  . Glucosamine-Chondroit-Vit C-Mn (GLUCOSAMINE CHONDR 1500 COMPLX) CAPS Take 1 capsule by mouth 2 (two) times daily.  . Melatonin 10 MG CAPS Take 10 mg by mouth at bedtime as needed (sleep).   . Multiple Vitamins-Minerals (WOMENS MULTI VITAMIN & MINERAL PO) Take 1 tablet by mouth daily.  . progesterone (PROMETRIUM) 100 MG capsule Take 1 capsule (100 mg total) by mouth daily.  . Red Yeast Rice 600  MG CAPS Take 600 mg by mouth daily.   . Turmeric 500 MG TABS Take 500 mg by mouth daily.   [name] ON 04/05/2020] venlafaxine XR (EFFEXOR XR) 150 MG 24 hr capsule Take 1 capsule (150 mg total) by mouth daily with breakfast. Begin after titration of 75mg .  . venlafaxine XR (EFFEXOR XR) 75 MG 24 hr capsule Take (1) cap PO QD x2 weeks, then increase to (2) cap PO.  . vitamin B-12 (CYANOCOBALAMIN) 100 MCG tablet Take 100 mcg by mouth daily.  . vitamin E 100 UNIT capsule Take 100 Units by mouth daily.   Facility-Administered Encounter Medications as of 03/09/2020  Medication  . 0.9 %  sodium chloride infusion     Objective:  Contacted patient to update her medications with [facility] since there have been changes since her onboarding form was submitted.  Goals Addressed   None     There are no care plans to display for this patient.  Patient states that she is trying to wean herself off of the estradiol and progesterone so she will not likely need these refilled.  Her new medication regimen is as follow:  Myrbetriq 50mg  daily - she has approx 4 weeks left - contacted specialist for new Rx to be called in to [facility]. Effexor 150mg  daily - she has approx 45 days left Diclofenac 75mg  - she reports she is now only taking once daily and now has approx 60 days left.  We will need a new prescription  to reflect the new dose.  Updated [facility] pharmacy on the medication changes for coordination of enhanced medication services.  Informed patient pharmacy will be in contact to walk her through that process.  Plan:   The patient has been provided with contact information for the care management team and has been advised to call with any health related questions or concerns.    Taylor Taylor, PharmD Clinical Pharmacist Taylor Taylor [phone]

## 2020-03-11 ENCOUNTER — Telehealth: Payer: Self-pay | Admitting: Family Medicine

## 2020-03-11 NOTE — Telephone Encounter (Signed)
Pt hasn't heard from anyone concerning referral for a ECHOCARDIOGRAM

## 2020-03-16 ENCOUNTER — Ambulatory Visit: Payer: Self-pay | Admitting: Pharmacist

## 2020-03-16 NOTE — Chronic Care Management (AMB) (Signed)
  Chronic Care Management   Follow Up Note   03/16/2020 Name: Taylor Taylor MRN: [id number] DOB: 01-23-1946  Referred by: Taylor Frizzle, MD Reason for referral : No chief complaint on file.   Taylor Taylor is a 74 y.o. year old female who is a primary care patient of Pickard, Cammie Mcgee, MD. The CCM team was consulted for assistance with chronic disease management and care coordination needs.    Review of patient status, including review of consultants reports, relevant laboratory and other test results, and collaboration with appropriate care team members and the patient's provider was performed as part of comprehensive patient evaluation and provision of chronic care management services.    SDOH (Social Determinants of Health) assessments performed: No See Care Plan activities for detailed interventions related to [facility])     Outpatient Encounter Medications as of 03/16/2020  Medication Sig  . acetaminophen (TYLENOL) 500 MG tablet Take 500 mg by mouth 2 (two) times daily.  Marland Kitchen ALPRAZolam (XANAX) 0.5 MG tablet Take 1 tablet (0.5 mg total) by mouth 3 (three) times daily as needed.  . cholecalciferol (VITAMIN D3) 25 MCG (1000 UNIT) tablet Take 125 mcg by mouth daily.  . diclofenac (VOLTAREN) 75 MG EC tablet TAKE 1 TABLET BY MOUTH TWICE A DAY  . estradiol (ESTRACE) 0.5 MG tablet Take 0.5 mg by mouth daily.   . ferrous sulfate 325 (65 FE) MG tablet Take 325 mg by mouth daily with breakfast.  . Fish Oil-Cholecalciferol (FISH OIL + D3) 1200-1000 MG-UNIT CAPS Take 1 tablet by mouth daily.  . Glucosamine-Chondroit-Vit C-Mn (GLUCOSAMINE CHONDR 1500 COMPLX) CAPS Take 1 capsule by mouth 2 (two) times daily.  . Melatonin 10 MG CAPS Take 10 mg by mouth at bedtime as needed (sleep).   . Multiple Vitamins-Minerals (WOMENS MULTI VITAMIN & MINERAL PO) Take 1 tablet by mouth daily.  . progesterone (PROMETRIUM) 100 MG capsule Take 1 capsule (100 mg total) by mouth daily.  . Red Yeast Rice 600  MG CAPS Take 600 mg by mouth daily.   . Turmeric 500 MG TABS Take 500 mg by mouth daily.   [name] ON 04/05/2020] venlafaxine XR (EFFEXOR XR) 150 MG 24 hr capsule Take 1 capsule (150 mg total) by mouth daily with breakfast. Begin after titration of 75mg .  . venlafaxine XR (EFFEXOR XR) 75 MG 24 hr capsule Take (1) cap PO QD x2 weeks, then increase to (2) cap PO.  . vitamin B-12 (CYANOCOBALAMIN) 100 MCG tablet Take 100 mcg by mouth daily.  . vitamin E 100 UNIT capsule Take 100 Units by mouth daily.   Facility-Administered Encounter Medications as of 03/16/2020  Medication  . 0.9 %  sodium chloride infusion     Objective:  Contact [facility] to refill Myrbetriq as it was called in to patients old pharmacy. Goals Addressed   None     There are no care plans to display for this patient.  Spoke to nurse who corrected prescription for Myrbetriq to come to [facility].  Patient now has all refills at [facility] and will be set up for her first delivery at the end of January.  Patient notified and aware to not pick up medication at [facility].  Plan:   The patient has been provided with contact information for the care management team and has been advised to call with any health related questions or concerns.    Beverly Milch, PharmD Clinical Pharmacist [place] [phone]

## 2020-03-17 ENCOUNTER — Other Ambulatory Visit: Payer: Self-pay

## 2020-03-17 DIAGNOSIS — R072 Precordial pain: Secondary | ICD-10-CM

## 2020-03-17 DIAGNOSIS — R0789 Other chest pain: Secondary | ICD-10-CM

## 2020-03-30 ENCOUNTER — Encounter: Payer: Self-pay | Admitting: Family Medicine

## 2020-04-01 ENCOUNTER — Encounter (HOSPITAL_COMMUNITY): Payer: Self-pay | Admitting: Family Medicine

## 2020-04-02 ENCOUNTER — Other Ambulatory Visit: Payer: Self-pay | Admitting: Family Medicine

## 2020-04-10 ENCOUNTER — Ambulatory Visit (HOSPITAL_COMMUNITY): Payer: PPO | Attending: Cardiology

## 2020-04-10 ENCOUNTER — Other Ambulatory Visit: Payer: Self-pay

## 2020-04-10 DIAGNOSIS — R0600 Dyspnea, unspecified: Secondary | ICD-10-CM | POA: Diagnosis not present

## 2020-04-10 DIAGNOSIS — R0609 Other forms of dyspnea: Secondary | ICD-10-CM

## 2020-04-10 LAB — ECHOCARDIOGRAM COMPLETE
Area-P 1/2: 3.95 cm2
S' Lateral: 2.6 cm

## 2020-04-24 ENCOUNTER — Telehealth: Payer: Self-pay | Admitting: Pharmacist

## 2020-04-24 NOTE — Progress Notes (Addendum)
Chronic Care Management Pharmacy Assistant   Name: Taylor Taylor  MRN: [id number] DOB: 05/19/1945  Reason for Encounter: Medication Review  PCP : [name], MD  Allergies:   Allergies  Allergen Reactions   Pollen Extract Itching    sneezing    Medications: Outpatient Encounter Medications as of 04/24/2020  Medication Sig   acetaminophen (TYLENOL) 500 MG tablet Take 500 mg by mouth 2 (two) times daily.   ALPRAZolam (XANAX) 0.5 MG tablet Take 1 tablet (0.5 mg total) by mouth 3 (three) times daily as needed.   cholecalciferol (VITAMIN D3) 25 MCG (1000 UNIT) tablet Take 125 mcg by mouth daily.   diclofenac (VOLTAREN) 75 MG EC tablet TAKE 1 TABLET BY MOUTH TWICE A DAY   estradiol (ESTRACE) 0.5 MG tablet Take 0.5 mg by mouth daily.    ferrous sulfate 325 (65 FE) MG tablet Take 325 mg by mouth daily with breakfast.   Fish Oil-Cholecalciferol (FISH OIL + D3) 1200-1000 MG-UNIT CAPS Take 1 tablet by mouth daily.   Glucosamine-Chondroit-Vit C-Mn (GLUCOSAMINE CHONDR 1500 COMPLX) CAPS Take 1 capsule by mouth 2 (two) times daily.   Melatonin 10 MG CAPS Take 10 mg by mouth at bedtime as needed (sleep).    Multiple Vitamins-Minerals (WOMENS MULTI VITAMIN & MINERAL PO) Take 1 tablet by mouth daily.   progesterone (PROMETRIUM) 100 MG capsule Take 1 capsule (100 mg total) by mouth daily.   Red Yeast Rice 600 MG CAPS Take 600 mg by mouth daily.    Turmeric 500 MG TABS Take 500 mg by mouth daily.    venlafaxine XR (EFFEXOR XR) 150 MG 24 hr capsule Take 1 capsule (150 mg total) by mouth daily with breakfast. Begin after titration of 75mg .   venlafaxine XR (EFFEXOR-XR) 75 MG 24 hr capsule TAKE 1 CAPSULE BY MOUTH EVERY DAY FOR 2 WEEKS THEN INCRASE TO 2 CAPSULES EVERY DAY   vitamin B-12 (CYANOCOBALAMIN) 100 MCG tablet Take 100 mcg by mouth daily.   vitamin E 100 UNIT capsule Take 100 Units by mouth daily.   Facility-Administered Encounter Medications as of 04/24/2020  Medication   0.9 %   sodium chloride infusion    Current Diagnosis: Patient Active Problem List   Diagnosis Date Noted   Bilateral primary osteoarthritis of knee 09/26/2019   Pain in right hip 04/30/2019   Neck pain on right side 12/25/2018   Pain in right shoulder 12/11/2018   Pain in right hand 05/18/2018   Pain in left hand 05/18/2018   Unilateral primary osteoarthritis, right knee 08/24/2016   Hypertriglyceridemia without hypercholesterolemia 09/03/2013   CHEST PAIN-PRECORDIAL 08/31/2009   CHEST PAIN, ATYPICAL 08/28/2009    Goals Addressed   None    Reviewed chart for medication changes ahead of medication coordination call.  No OVs, Consults, or hospital visits since last care coordination call.  No medication changes indicated.  BP Readings from Last 3 Encounters:  03/02/20 130/66  07/15/19 132/68  12/25/18 126/62    No results found for: HGBA1C   Patient obtains medications through [facility]  90 Days   Last adherence delivery included:  Venlafaxine ER 150 mg for 14 DS on 04/17/20  Patient declined meds last month: Myrbetriq 50 Mg 1 tablet by mouth daily. (enough on hand)  Patient is due for next adherence delivery on: 05/01/20.  Called patient and reviewed medications and coordinated delivery.  This delivery to include: Myrbetriq 500Mg  1 tablet by mouth daily. Venlafaxine ER 150 Mg 1 capsule  by mouth every morning with food.  Patient declined the following medications: None.  Patient does not need any refills at this time.  Confirmed delivery date of 05/01/20, advised patient that pharmacy will contact them the morning of delivery.  Follow-Up:  Coordination of Enhanced Pharmacy Services and Pharmacist Review   Charlann Lange, [place] Pharmacist Assistant [phone]  10 minutes spent in review, coordination, and documentation.  Reviewed by: Beverly Milch, PharmD Clinical Pharmacist [facility] [phone]

## 2020-04-30 ENCOUNTER — Telehealth: Payer: Self-pay | Admitting: *Deleted

## 2020-04-30 ENCOUNTER — Telehealth: Payer: Self-pay | Admitting: Pharmacist

## 2020-04-30 MED ORDER — VENLAFAXINE HCL ER 150 MG PO CP24: 150.0000 mg | ORAL_CAPSULE | Freq: Every day | ORAL | 1 refills | Status: DC

## 2020-04-30 NOTE — Progress Notes (Addendum)
° ° °  Chronic Care Management Pharmacy Assistant   Name: Taylor Taylor  MRN: [id number] DOB: [birth year]  Reason for Encounter: Medication Review   PCP : [name], MD  Allergies:   Allergies  Allergen Reactions   Pollen Extract Itching    sneezing    Medications: Outpatient Encounter Medications as of 04/30/2020  Medication Sig   acetaminophen (TYLENOL) 500 MG tablet Take 500 mg by mouth 2 (two) times daily.   ALPRAZolam (XANAX) 0.5 MG tablet Take 1 tablet (0.5 mg total) by mouth 3 (three) times daily as needed.   cholecalciferol (VITAMIN D3) 25 MCG (1000 UNIT) tablet Take 125 mcg by mouth daily.   diclofenac (VOLTAREN) 75 MG EC tablet TAKE 1 TABLET BY MOUTH TWICE A DAY   estradiol (ESTRACE) 0.5 MG tablet Take 0.5 mg by mouth daily.    ferrous sulfate 325 (65 FE) MG tablet Take 325 mg by mouth daily with breakfast.   Fish Oil-Cholecalciferol (FISH OIL + D3) 1200-1000 MG-UNIT CAPS Take 1 tablet by mouth daily.   Glucosamine-Chondroit-Vit C-Mn (GLUCOSAMINE CHONDR 1500 COMPLX) CAPS Take 1 capsule by mouth 2 (two) times daily.   Melatonin 10 MG CAPS Take 10 mg by mouth at bedtime as needed (sleep).    Multiple Vitamins-Minerals (WOMENS MULTI VITAMIN & MINERAL PO) Take 1 tablet by mouth daily.   progesterone (PROMETRIUM) 100 MG capsule Take 1 capsule (100 mg total) by mouth daily.   Red Yeast Rice 600 MG CAPS Take 600 mg by mouth daily.    Turmeric 500 MG TABS Take 500 mg by mouth daily.    venlafaxine XR (EFFEXOR XR) 150 MG 24 hr capsule Take 1 capsule (150 mg total) by mouth daily with breakfast. Begin after titration of 75mg .   venlafaxine XR (EFFEXOR-XR) 75 MG 24 hr capsule TAKE 1 CAPSULE BY MOUTH EVERY DAY FOR 2 WEEKS THEN INCRASE TO 2 CAPSULES EVERY DAY   vitamin B-12 (CYANOCOBALAMIN) 100 MCG tablet Take 100 mcg by mouth daily.   vitamin E 100 UNIT capsule Take 100 Units by mouth daily.   Facility-Administered Encounter Medications as of 04/30/2020  Medication   0.9  %  sodium chloride infusion    Current Diagnosis: Patient Active Problem List   Diagnosis Date Noted   Bilateral primary osteoarthritis of knee 09/26/2019   Pain in right hip 04/30/2019   Neck pain on right side 12/25/2018   Pain in right shoulder 12/11/2018   Pain in right hand 05/18/2018   Pain in left hand 05/18/2018   Unilateral primary osteoarthritis, right knee 08/24/2016   Hypertriglyceridemia without hypercholesterolemia 09/03/2013   CHEST PAIN-PRECORDIAL 08/31/2009   CHEST PAIN, ATYPICAL 08/28/2009    Goals Addressed   None    Called patient to verify if she was okay with a 60 DS of Venlafaxine ER 150 mg instead of a 90 DS till we could get a Rx refill from her doctors office. Patient stated she would rather have the 51 DS Rx simply because of the cost. Requested a refill for the Rx from her doctors office this morning.   Follow-Up:  Pharmacist Review   Charlann Lange, RMA Clinical Pharmacist Assistant [phone]  4 minutes spent in review, coordination, and documentation.  Reviewed by: Beverly Milch, PharmD Clinical Pharmacist [facility] [phone]

## 2020-04-30 NOTE — Telephone Encounter (Signed)
-----   Message from Quentin sent at 04/30/2020 10:46 AM EST ----- Regarding: Medication refill Good morning [facility] for Venlafaxine ER 150 mg for 90 DS with refills thank you.

## 2020-05-04 ENCOUNTER — Ambulatory Visit: Payer: PPO | Admitting: Family Medicine

## 2020-05-04 ENCOUNTER — Other Ambulatory Visit: Payer: Self-pay

## 2020-05-04 ENCOUNTER — Encounter: Payer: Self-pay | Admitting: Family Medicine

## 2020-05-04 DIAGNOSIS — M79644 Pain in right finger(s): Secondary | ICD-10-CM

## 2020-05-04 NOTE — Progress Notes (Signed)
Office Visit Note   [name]           Date of Birth: 20-Aug-1945           MRN: [id number] Visit Date: 05/04/2020 Requested by: [name], MD [address] [place],  [place] [address] PCP: [name], MD  Subjective: Chief Complaint  Patient presents with  . Right Thumb - Pain    Thumb is painful and swollen again. Has had cortisone injection by Dr. Durward Fortes a few months ago. It does help. That thumb also triggers - would like to know what can be done about that.     HPI: She is here with right thumb pain.  Symptoms started in the past couple weeks.  She had a CMC injection in October which helped, the joint does not seem to be bothering her.  But she complains of pain on the volar aspect of her MCP area as well as some triggering.  She has been using Voltaren gel.  She takes glucosamine chronically.               ROS:   All other systems were reviewed and are negative.  Objective: Vital Signs: There were no vitals taken for this visit.  Physical Exam:  General:  Alert and oriented, in no acute distress. Pulm:  Breathing unlabored. Psy:  Normal mood, congruent affect.  Right hand: She has visible deformity of the thumb CMC joint but no tenderness to palpation there.  She has a tender nodule at the A1 pulley of the right thumb and this seems to reproduce her pain.  She also has triggering at the A1 pulley with flexion of the IP joint.  Imaging: No results found.  Assessment & Plan: 1.  Right trigger thumb -We discussed options and elected to try splinting the thumb in extension for the next week, she will use ice to the palm of her hand and Voltaren gel.  Could inject with cortisone if triggering persists. 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      Procedures: No procedures performed        PMFS History: Patient Active Problem List   Diagnosis Date Noted  . Bilateral primary osteoarthritis of knee  09/26/2019  . Pain in right hip 04/30/2019  . Neck pain on right side 12/25/2018  . Pain in right shoulder 12/11/2018  . Pain in right hand 05/18/2018  . Pain in left hand 05/18/2018  . Unilateral primary osteoarthritis, right knee 08/24/2016  . Hypertriglyceridemia without hypercholesterolemia 09/03/2013  . CHEST PAIN-PRECORDIAL 08/31/2009  . CHEST PAIN, ATYPICAL 08/28/2009   Past Medical History:  Diagnosis Date  . Allergy    Seasonal  . Arthritis    back  . GERD (gastroesophageal reflux disease)    in past    Family History  Problem Relation Age of Onset  . Parkinson's disease Mother   . Brain cancer Father   . Breast cancer Neg Hx     Past Surgical History:  Procedure Laterality Date  . CERVICAL DISC SURGERY     over 10 years  . COLONOSCOPY     Social History   Occupational History  . Not on file  Tobacco Use  . Smoking status: Never Smoker  . Smokeless tobacco: Never Used  Vaping Use  . Vaping Use: Never used  Substance and Sexual Activity  . Alcohol use: No  . Drug use: No  . Sexual  activity: Not on file

## 2020-05-04 NOTE — Telephone Encounter (Signed)
Patient had echo on 04/10/20 information was documented on order.

## 2020-05-26 ENCOUNTER — Telehealth: Payer: Self-pay | Admitting: Pharmacist

## 2020-05-26 NOTE — Progress Notes (Addendum)
° ° °  Chronic Care Management Pharmacy Assistant   Name: Taylor Taylor  MRN: [id number] DOB: 17-Sep-1945  Reason for Encounter: Disease State   Medications: Outpatient Encounter Medications as of 05/26/2020  Medication Sig   acetaminophen (TYLENOL) 500 MG tablet Take 500 mg by mouth 2 (two) times daily.   ALPRAZolam (XANAX) 0.5 MG tablet Take 1 tablet (0.5 mg total) by mouth 3 (three) times daily as needed.   cholecalciferol (VITAMIN D3) 25 MCG (1000 UNIT) tablet Take 125 mcg by mouth daily.   diclofenac (VOLTAREN) 75 MG EC tablet TAKE 1 TABLET BY MOUTH TWICE A DAY   estradiol (ESTRACE) 0.5 MG tablet Take 0.5 mg by mouth daily.    ferrous sulfate 325 (65 FE) MG tablet Take 325 mg by mouth daily with breakfast.   Fish Oil-Cholecalciferol (FISH OIL + D3) 1200-1000 MG-UNIT CAPS Take 1 tablet by mouth daily.   Glucosamine-Chondroit-Vit C-Mn (GLUCOSAMINE CHONDR 1500 COMPLX) CAPS Take 1 capsule by mouth 2 (two) times daily.   Melatonin 10 MG CAPS Take 10 mg by mouth at bedtime as needed (sleep).    Multiple Vitamins-Minerals (WOMENS MULTI VITAMIN & MINERAL PO) Take 1 tablet by mouth daily.   progesterone (PROMETRIUM) 100 MG capsule Take 1 capsule (100 mg total) by mouth daily.   Red Yeast Rice 600 MG CAPS Take 600 mg by mouth daily.    Turmeric 500 MG TABS Take 500 mg by mouth daily.    venlafaxine XR (EFFEXOR XR) 150 MG 24 hr capsule Take 1 capsule (150 mg total) by mouth daily with breakfast.   vitamin B-12 (CYANOCOBALAMIN) 100 MCG tablet Take 100 mcg by mouth daily.   vitamin E 100 UNIT capsule Take 100 Units by mouth daily.   Facility-Administered Encounter Medications as of 05/26/2020  Medication   0.9 %  sodium chloride infusion    Reviewed chart for medication changes ahead of medication coordination call.  No OVs, hospital visits since last care coordination call.  Consults: 05/04/20 Orthopedics Hilts, Legrand Como, MD. Right thumb pain. Per note: Patient was advised stay in the  splint for another week as well as to use ice and Voltaren gel. Discussed cortisone injection.  BP Readings from Last 3 Encounters:  03/02/20 130/66  07/15/19 132/68  12/25/18 126/62    No results found for: HGBA1C   Patient obtains medications through [facility]  90 Days   Last adherence delivery included: Myrbetriq 500 Mg 1 tablet by mouth daily. Venlafaxine ER 150 Mg 1 capsule by mouth every morning with food.  Patient declined meds last month: None  Patient is not due for an adherence delivery at this time.  Called patient and reviewed medications and coordinated delivery.  This delivery to include: None  Patient declined the following medications: Myrbetriq 500 Mg 1 tablet by mouth daily. Venlafaxine ER 150 Mg 1 capsule by mouth every morning with food.  Patient does not need any refills at this time   Follow Up: Pharmacist Review  Charlann Lange, [place] Pharmacist Assistant [phone]  10 minutes spent in review, coordination, and documentation.  Reviewed by: Beverly Milch, PharmD Clinical Pharmacist [facility] [phone]

## 2020-06-22 ENCOUNTER — Telehealth: Payer: Self-pay | Admitting: Pharmacist

## 2020-06-22 NOTE — Progress Notes (Addendum)
    Chronic Care Management Pharmacy Assistant   Name: Taylor Taylor  MRN: [id number] DOB: 12-23-1945  Reason for Encounter: Medication Review-Medication Coordination Call.   Medications: Outpatient Encounter Medications as of 06/22/2020  Medication Sig   acetaminophen (TYLENOL) 500 MG tablet Take 500 mg by mouth 2 (two) times daily.   ALPRAZolam (XANAX) 0.5 MG tablet Take 1 tablet (0.5 mg total) by mouth 3 (three) times daily as needed.   cholecalciferol (VITAMIN D3) 25 MCG (1000 UNIT) tablet Take 125 mcg by mouth daily.   diclofenac (VOLTAREN) 75 MG EC tablet TAKE 1 TABLET BY MOUTH TWICE A DAY   estradiol (ESTRACE) 0.5 MG tablet Take 0.5 mg by mouth daily.    ferrous sulfate 325 (65 FE) MG tablet Take 325 mg by mouth daily with breakfast.   Fish Oil-Cholecalciferol (FISH OIL + D3) 1200-1000 MG-UNIT CAPS Take 1 tablet by mouth daily.   Glucosamine-Chondroit-Vit C-Mn (GLUCOSAMINE CHONDR 1500 COMPLX) CAPS Take 1 capsule by mouth 2 (two) times daily.   Melatonin 10 MG CAPS Take 10 mg by mouth at bedtime as needed (sleep).    Multiple Vitamins-Minerals (WOMENS MULTI VITAMIN & MINERAL PO) Take 1 tablet by mouth daily.   progesterone (PROMETRIUM) 100 MG capsule Take 1 capsule (100 mg total) by mouth daily.   Red Yeast Rice 600 MG CAPS Take 600 mg by mouth daily.    Turmeric 500 MG TABS Take 500 mg by mouth daily.    venlafaxine XR (EFFEXOR XR) 150 MG 24 hr capsule Take 1 capsule (150 mg total) by mouth daily with breakfast.   vitamin B-12 (CYANOCOBALAMIN) 100 MCG tablet Take 100 mcg by mouth daily.   vitamin E 100 UNIT capsule Take 100 Units by mouth daily.   Facility-Administered Encounter Medications as of 06/22/2020  Medication   0.9 %  sodium chloride infusion   Reviewed chart for medication changes ahead of medication coordination call.  No OVs, Consults, or hospital visits since last care coordination call.  No medication changes indicated.  BP Readings from Last 3 Encounters:   03/02/20 130/66  07/15/19 132/68  12/25/18 126/62    No results found for: HGBA1C   Patient obtains medications through [facility]  90 Days   Last adherence delivery included:  Myrbetriq 500 Mg 1 tablet by mouth daily. Venlafaxine ER 150 Mg 1 capsule by mouth every morning with food.  Patient declined meds last month: Myrbetriq 500 Mg 1 tablet by mouth daily. Venlafaxine ER 150 Mg 1 capsule by mouth every morning with food.  Patient is not due for an adherence delivery at this time.  Called patient and reviewed medications and coordinated delivery.  This delivery to include: None  Patient declined the following medications: Myrbetriq 500 Mg 1 tablet by mouth daily. Venlafaxine ER 150 Mg 1 capsule by mouth every morning with food.  Patient does not need refills at this time.   Follow-Up:Pharmacist Review  Charlann Lange, RMA Clinical Pharmacist Assistant [phone]   5 minutes spent in review, coordination, and documentation.  Reviewed by: Beverly Milch, PharmD Clinical Pharmacist [facility] [phone]

## 2020-06-24 ENCOUNTER — Telehealth: Payer: Self-pay | Admitting: *Deleted

## 2020-06-24 ENCOUNTER — Telehealth: Payer: Self-pay | Admitting: Pharmacist

## 2020-06-24 MED ORDER — DICLOFENAC SODIUM 75 MG PO TBEC: 75.0000 mg | DELAYED_RELEASE_TABLET | Freq: Two times a day (BID) | ORAL | 3 refills | Status: DC

## 2020-06-24 NOTE — Telephone Encounter (Signed)
-----   Message from Rosetta Posner sent at 06/24/2020 11:11 AM EDT ----- Regarding: Medication Refill Good morning. Can you please send Diclofenac 75 mg to [facility] for 90 DS. Thank you.

## 2020-06-24 NOTE — Progress Notes (Addendum)
    Chronic Care Management Pharmacy Assistant   Name: Taylor Taylor  MRN: [id number] DOB: [birth year]  Reason for Encounter: Medication Review  Medications: Outpatient Encounter Medications as of 06/24/2020  Medication Sig   acetaminophen (TYLENOL) 500 MG tablet Take 500 mg by mouth 2 (two) times daily.   ALPRAZolam (XANAX) 0.5 MG tablet Take 1 tablet (0.5 mg total) by mouth 3 (three) times daily as needed.   cholecalciferol (VITAMIN D3) 25 MCG (1000 UNIT) tablet Take 125 mcg by mouth daily.   diclofenac (VOLTAREN) 75 MG EC tablet TAKE 1 TABLET BY MOUTH TWICE A DAY   estradiol (ESTRACE) 0.5 MG tablet Take 0.5 mg by mouth daily.    ferrous sulfate 325 (65 FE) MG tablet Take 325 mg by mouth daily with breakfast.   Fish Oil-Cholecalciferol (FISH OIL + D3) 1200-1000 MG-UNIT CAPS Take 1 tablet by mouth daily.   Glucosamine-Chondroit-Vit C-Mn (GLUCOSAMINE CHONDR 1500 COMPLX) CAPS Take 1 capsule by mouth 2 (two) times daily.   Melatonin 10 MG CAPS Take 10 mg by mouth at bedtime as needed (sleep).    Multiple Vitamins-Minerals (WOMENS MULTI VITAMIN & MINERAL PO) Take 1 tablet by mouth daily.   progesterone (PROMETRIUM) 100 MG capsule Take 1 capsule (100 mg total) by mouth daily.   Red Yeast Rice 600 MG CAPS Take 600 mg by mouth daily.    Turmeric 500 MG TABS Take 500 mg by mouth daily.    venlafaxine XR (EFFEXOR XR) 150 MG 24 hr capsule Take 1 capsule (150 mg total) by mouth daily with breakfast.   vitamin B-12 (CYANOCOBALAMIN) 100 MCG tablet Take 100 mcg by mouth daily.   vitamin E 100 UNIT capsule Take 100 Units by mouth daily.   Facility-Administered Encounter Medications as of 06/24/2020  Medication   0.9 %  sodium chloride infusion   I spoke with the patient about a new medication Diclofenac 75 mg. Requested that the medication be sent to [facility]. She has asked for it to be delivered on Friday the 8th for an 90 DS. An ACUTE form has been filled out and completed.     Follow-Up:Pharmacist Review  Charlann Lange, RMA Clinical Pharmacist Assistant [phone]  3 minutes spent in review, coordination, and documentation.  Reviewed by: Beverly Milch, PharmD Clinical Pharmacist [facility] [phone]

## 2020-06-24 NOTE — Telephone Encounter (Signed)
Prescription sent to pharmacy.

## 2020-06-26 NOTE — Progress Notes (Signed)
Chronic Care Management Pharmacy Note  07/02/2020 Name:  Taylor Taylor MRN:  [id number] DOB:  1945-05-14  Subjective: Taylor Taylor is an 75 y.o. year old female who is a primary patient of Pickard, Cammie Mcgee, MD.  The CCM team was consulted for assistance with disease management and care coordination needs.    Engaged with patient by telephone for follow up visit in response to provider referral for pharmacy case management and/or care coordination services.   Consent to Services:  The patient was given the following information about Chronic Care Management services today, agreed to services, and gave verbal consent: 1. CCM service includes personalized support from designated clinical staff supervised by the primary care provider, including individualized plan of care and coordination with other care providers 2. 24/7 contact phone numbers for assistance for urgent and routine care needs. 3. Service will only be billed when office clinical staff spend 20 minutes or more in a month to coordinate care. 4. Only one practitioner may furnish and bill the service in a calendar month. 5.The patient may stop CCM services at any time (effective at the end of the month) by phone call to the office staff. 6. The patient will be responsible for cost sharing (co-pay) of up to 20% of the service fee (after annual deductible is met). Patient agreed to services and consent obtained.  Patient Care Team: [name], MD as PCP - General (Family Medicine) Edythe Clarity, [facility] as Pharmacist (Pharmacist)  Recent office visits: None since last CCM visit  Recent consult visits: 05/04/20 Orthopedics Hilts, Legrand Como, MD. Right thumb pain. Per note: Patient was advised stay in the splint for another week as well as to use ice and Voltaren gel. Discussed cortisone injection.  Hospital visits: None in previous 6 months  Objective:  Lab Results  Component Value Date   CREATININE 0.85  03/02/2020   BUN 27 (H) 03/02/2020   GFRNONAA 68 03/02/2020   GFRAA 79 03/02/2020   NA 138 03/02/2020   K 5.3 03/02/2020   CALCIUM 10.2 03/02/2020   CO2 28 03/02/2020   GLUCOSE 92 03/02/2020    No results found for: HGBA1C, FRUCTOSAMINE, GFR, MICROALBUR  Last diabetic Eye exam: No results found for: HMDIABEYEEXA  Last diabetic Foot exam: No results found for: HMDIABFOOTEX   Lab Results  Component Value Date   CHOL 201 (H) 07/04/2019   HDL 93 07/04/2019   LDLCALC 96 07/04/2019   TRIG 41 07/04/2019   CHOLHDL 2.2 07/04/2019    Hepatic Function Latest Ref Rng & Units 03/02/2020 07/04/2019 11/05/2018  Total Protein 6.1 - 8.1 g/dL 6.8 6.6 6.6  Albumin 3.6 - 5.1 g/dL - - -  AST 10 - 35 U/L _0 ALT 6 - 29 U/L _1 Alk Phosphatase 33 - 130 U/L - - -  Total Bilirubin 0.2 - 1.2 mg/dL 0.3 0.5 0.3    Lab Results  Component Value Date/Time   TSH 1.86 03/02/2020 03:50 PM   TSH 1.38 11/05/2018 04:33 PM    CBC Latest Ref Rng & Units 03/02/2020 07/04/2019 12/24/2018  WBC 3.8 - 10.8 Thousand/uL 7.3 8.1 5.5  Hemoglobin 11.7 - 15.5 g/dL 13.0 13.5 13.8  Hematocrit 35.0 - 45.0 % 38.4 41.4 41.5  Platelets 140 - 400 Thousand/uL 301 296 287    Lab Results  Component Value Date/Time   VD25OH 63 11/12/2015 09:02 AM    Clinical ASCVD: No  The 10-year ASCVD risk score Mikey Bussing  DC Jr., et al., 2013) is: 14.7%   Values used to calculate the score:     Age: 39 years     Sex: Female     Is Non-Hispanic African American: No     Diabetic: No     Tobacco smoker: No     Systolic Blood Pressure: 916 mmHg     Is BP treated: No     HDL Cholesterol: 93 mg/dL     Total Cholesterol: 201 mg/dL    Depression screen [facility] 2/9 07/15/2019 02/21/2018 05/25/2017  Decreased Interest 0 0 0  Down, Depressed, Hopeless 0 0 0  PHQ - 2 Score 0 0 0  Altered sleeping - 3 -  Tired, decreased energy - 0 -  Change in appetite - 0 -  Feeling bad or failure about yourself  - 0 -  Trouble concentrating - 1 -   Moving slowly or fidgety/restless - 0 -  Suicidal thoughts - 0 -  PHQ-9 Score - 4 -  Difficult doing work/chores - Somewhat difficult -      Social History   Tobacco Use  Smoking Status Never Smoker  Smokeless Tobacco Never Used   BP Readings from Last 3 Encounters:  03/02/20 130/66  07/15/19 132/68  12/25/18 126/62   Pulse Readings from Last 3 Encounters:  03/02/20 67  07/15/19 76  12/25/18 73   Wt Readings from Last 3 Encounters:  03/02/20 132 lb (59.9 kg)  01/15/20 125 lb (56.7 kg)  11/28/19 125 lb (56.7 kg)   BMI Readings from Last 3 Encounters:  03/02/20 23.38 kg/m  01/15/20 22.14 kg/m  11/28/19 22.14 kg/m    Assessment/Interventions: Review of patient past medical history, allergies, medications, health status, including review of consultants reports, laboratory and other test data, was performed as part of comprehensive evaluation and provision of chronic care management services.   SDOH:  (Social Determinants of Health) assessments and interventions performed: Yes   Financial Resource Strain: Low Risk   . Difficulty of Paying Living Expenses: Not hard at all    SDOH Screenings   Alcohol Screen: Low Risk   . Last Alcohol Screening Score (AUDIT): 0  Depression (PHQ2-9): Low Risk   . PHQ-2 Score: 0  Financial Resource Strain: Low Risk   . Difficulty of Paying Living Expenses: Not hard at all  Food Insecurity: Not on file  Housing: Not on file  Physical Activity: Not on file  Social Connections: Not on file  Stress: Not on file  Tobacco Use: Low Risk   . Smoking Tobacco Use: Never Smoker  . Smokeless Tobacco Use: Never Used  Transportation Needs: Not on file    CCM Care Plan  Allergies  Allergen Reactions  . Pollen Extract Itching    sneezing    Medications Reviewed Today    Reviewed by Edythe Clarity, [facility] (Pharmacist) on 07/02/20 at 1503  Med List Status: <None>  Medication Order Taking? Sig Documenting Provider Last Dose Status  Informant  0.9 %  sodium chloride infusion [id number]   Pyrtle, Lajuan Lines, MD  Active   acetaminophen (TYLENOL) 500 MG tablet [id number] Yes Take 500 mg by mouth 2 (two) times daily. [provider] Taking Active Self       Patient not taking:      Discontinued 07/02/20 1503 (No longer needed (for PRN medications))   cholecalciferol (VITAMIN D3) 25 MCG (1000 UNIT) tablet [id number] Yes Take 125 mcg by mouth daily. [provider] Taking Active   diclofenac (  VOLTAREN) 75 MG EC tablet [id number]  Take 1 tablet (75 mg total) by mouth 2 (two) times daily. [name], MD  Active        Patient not taking:      Discontinued 07/02/20 1503 (No longer needed (for PRN medications))            Med Note (WILLIS, SANDY B   Thu May 25, 2017 10:37 AM)    ferrous sulfate 325 (65 FE) MG tablet [id number]  Take 325 mg by mouth daily with breakfast. [provider]  Active Self  Fish Oil-Cholecalciferol (FISH OIL + D3) 1200-1000 MG-UNIT CAPS [id number]  Take 1 tablet by mouth daily. [provider]  Active Self  Glucosamine-Chondroit-Vit C-Mn (GLUCOSAMINE CHONDR 1500 COMPLX) CAPS [id number]  Take 1 capsule by mouth 2 (two) times daily. [provider]  Active Self  Melatonin 10 MG CAPS [id number]  Take 10 mg by mouth at bedtime as needed (sleep).  [provider]  Active Self  Multiple Vitamins-Minerals (WOMENS MULTI VITAMIN & MINERAL PO) [id number]  Take 1 tablet by mouth daily. [provider]  Active Self       Patient not taking:      Discontinued 07/02/20 1503 (No longer needed (for PRN medications))   Red Yeast Rice 600 MG CAPS [id number]  Take 600 mg by mouth daily.  [provider]  Active Self  Turmeric 500 MG TABS [id number]  Take 500 mg by mouth daily.  [provider]  Active Self  venlafaxine XR (EFFEXOR XR) 150 MG 24 hr capsule [id number] Yes Take 1 capsule (150 mg total) by mouth daily with breakfast. [name], MD Taking  Active   vitamin B-12 (CYANOCOBALAMIN) 100 MCG tablet [id number]  Take 100 mcg by mouth daily. [provider]  Active Self  vitamin E 100 UNIT capsule [id number]  Take 100 Units by mouth daily. [provider]  Active Self          Patient Active Problem List   Diagnosis Date Noted  . Bilateral primary osteoarthritis of knee 09/26/2019  . Pain in right hip 04/30/2019  . Neck pain on right side 12/25/2018  . Pain in right shoulder 12/11/2018  . Pain in right hand 05/18/2018  . Pain in left hand 05/18/2018  . Unilateral primary osteoarthritis, right knee 08/24/2016  . Hypertriglyceridemia without hypercholesterolemia 09/03/2013  . CHEST PAIN-PRECORDIAL 08/31/2009  . CHEST PAIN, ATYPICAL 08/28/2009    Immunization History  Administered Date(s) Administered  . Fluad Quad(high Dose 65+) 12/06/2018  . Hepatitis A 12/10/1997, 09/17/1998  . Hepatitis B 12/10/1997, 01/09/1998, 08/11/1998  . Influenza Split 01/18/2012  . Influenza, High Dose Seasonal PF 12/22/2015, 12/15/2017  . Influenza,inj,Quad PF,6+ Mos 01/01/2013, 01/07/2014, 12/30/2014  . PFIZER(Purple Top)SARS-COV-2 Vaccination 04/12/2019, 05/02/2019  . Pneumococcal Conjugate-13 07/17/2014  . Pneumococcal Polysaccharide-23 07/12/2011  . Td 12/09/1997  . Tdap 07/12/2011  . Zoster 02/26/2010  . Zoster Recombinat (Shingrix) 08/12/2016    Conditions to be addressed/monitored:  Osteoarthritis, Hypertriglyceridemia  Care Plan : General Pharmacy (Adult)  Updates made by Edythe Clarity, RPH since 07/02/2020 12:00 AM    Problem: HLD, Osteoarthritis   Priority: High  Onset Date: 07/02/2020    Long-Range Goal: Patient-Specific Goal   Start Date: 07/02/2020  Expected End Date: 01/01/2021  This Visit's Progress: On track  Priority: High  Note:   Current Barriers:  . No specific barriers identified at this time  Pharmacist Clinical Goal(s):  Marland Kitchen Patient will verbalize ability  to afford treatment  regimen . achieve adherence to monitoring guidelines and medication adherence to achieve therapeutic efficacy . contact provider office for questions/concerns as evidenced notation of same in electronic health record through collaboration with PharmD and provider.   Interventions: . 1:1 collaboration with [name], MD regarding development and update of comprehensive plan of care as evidenced by provider attestation and co-signature . Inter-disciplinary care team collaboration (see longitudinal plan of care) . Comprehensive medication review performed; medication list updated in electronic medical record  Hyperlipidemia: (LDL goal < 100, TG < 150 -Controlled -Current treatment: . Red Yeast Rice . Fish Oil 1071m daily -Medications previously tried: none noted   -Educated on Cholesterol goals;  Importance of limiting foods high in cholesterol; Exercise goal of 150 minutes per week; -Recommended continue current management strategy.  Patient would benefit from updated lipid panel  Osteoarthritis (Goal: Minimize symptoms) -Controlled -Current treatment  . Voltaren 754mEC daily . Tylenol 50027mMedications previously tried: none noted  -Pain is controlled with Voltaren -Counseled on avoidance of other NSAIDs with Voltaren treatment, patient aware. Recommended continue current medications   Patient Goals/Self-Care Activities . Patient will:  - take medications as prescribed focus on medication adherence by pill packs Continue to work to bring total cholesterol to goal.  Follow Up Plan: The care management team will reach out to the patient again over the next 180 days.        Medication Assistance: None required.  Patient affirms current coverage meets needs.  Patient's preferred pharmacy is:  [facility] [id number]. [place]: [phone]: [phone]  pill box? No - synchronization through [facility] Pt endorses 100% compliance  We discussed: Benefits of medication synchronization, packaging and delivery as well as enhanced pharmacist oversight with [facility]. Patient decided to: Utilize [facility] for medication synchronization, packaging and delivery  Care Plan and Follow Up Patient Decision:  Patient agrees to Care Plan and Follow-up.  Plan: The care management team will reach out to the patient again over the next 180 days.  ChrBeverly MilchharmD Clinical Pharmacist [phone]

## 2020-06-30 DIAGNOSIS — H04122 Dry eye syndrome of left lacrimal gland: Secondary | ICD-10-CM | POA: Diagnosis not present

## 2020-07-02 ENCOUNTER — Ambulatory Visit (INDEPENDENT_AMBULATORY_CARE_PROVIDER_SITE_OTHER): Payer: PPO | Admitting: Pharmacist

## 2020-07-02 DIAGNOSIS — M170 Bilateral primary osteoarthritis of knee: Secondary | ICD-10-CM | POA: Diagnosis not present

## 2020-07-02 DIAGNOSIS — E781 Pure hyperglyceridemia: Secondary | ICD-10-CM | POA: Diagnosis not present

## 2020-07-02 NOTE — Patient Instructions (Addendum)
Visit Information  Goals Addressed            This Visit's Progress   . Cope with Pain-Osteoarthritis       Follow Up Date 10/18/20    - learn relaxation techniques - tell myself I can (not I can't) - use distraction techniques - use relaxation during pain    Why is this important?    Living with joint pain and enjoying your life may be hard.   Feelings like depression or anger can make your pain worse.   Learning ways to cope may help you find some relief from the pain.    Notes:       Patient Care Plan: General Pharmacy (Adult)    Problem Identified: HLD, Osteoarthritis   Priority: High  Onset Date: 07/02/2020    Long-Range Goal: Patient-Specific Goal   Start Date: 07/02/2020  Expected End Date: 01/01/2021  This Visit's Progress: On track  Priority: High  Note:   Current Barriers:  . No specific barriers identified at this time  Pharmacist Clinical Goal(s):  Marland Kitchen Patient will verbalize ability to afford treatment regimen . achieve adherence to monitoring guidelines and medication adherence to achieve therapeutic efficacy . contact provider office for questions/concerns as evidenced notation of same in electronic health record through collaboration with PharmD and provider.   Interventions: . 1:1 collaboration with [name], MD regarding development and update of comprehensive plan of care as evidenced by provider attestation and co-signature . Inter-disciplinary care team collaboration (see longitudinal plan of care) . Comprehensive medication review performed; medication list updated in electronic medical record  Hyperlipidemia: (LDL goal < 100, TG < 150 -Controlled -Current treatment: . Red Yeast Rice . Fish Oil 1000mg  daily -Medications previously tried: none noted   -Educated on Cholesterol goals;  Importance of limiting foods high in cholesterol; Exercise goal of 150 minutes per week; -Recommended continue current management strategy.  Patient  would benefit from updated lipid panel  Osteoarthritis (Goal: Minimize symptoms) -Controlled -Current treatment  . Voltaren 75mg  EC daily . Tylenol 500mg  -Medications previously tried: none noted  -Pain is controlled with Voltaren -Counseled on avoidance of other NSAIDs with Voltaren treatment, patient aware. Recommended continue current medications   Patient Goals/Self-Care Activities . Patient will:  - take medications as prescribed focus on medication adherence by pill packs Continue to work to bring total cholesterol to goal.  Follow Up Plan: The care management team will reach out to the patient again over the next 180 days.        The patient verbalized understanding of instructions, educational materials, and care plan provided today and agreed to receive a mailed copy of patient instructions, educational materials, and care plan.  Telephone follow up appointment with pharmacy team member scheduled for: 6 months  Edythe Clarity, [facility]  Preventing High Cholesterol Cholesterol is a white, waxy substance similar to fat that the human body needs to help build cells. The liver makes all the cholesterol that a person's body needs. Having high cholesterol (hypercholesterolemia) increases your risk for heart disease and stroke. Extra or excess cholesterol comes from the food that you eat. High cholesterol can often be prevented with diet and lifestyle changes. If you already have high cholesterol, you can control it with diet, lifestyle changes, and medicines. How can high cholesterol affect me? If you have high cholesterol, fatty deposits (plaques) may build up on the walls of your blood vessels. The blood vessels that carry blood away from your heart  are called arteries. Plaques make the arteries narrower and stiffer. This in turn can:  Restrict or block blood flow and cause blood clots to form.  Increase your risk for heart attack and stroke. What can increase my risk for  high cholesterol? This condition is more likely to develop in people who:  Eat foods that are high in saturated fat or cholesterol. Saturated fat is mostly found in foods that come from animal sources.  Are overweight.  Are not getting enough exercise.  Have a family history of high cholesterol (familial hypercholesterolemia). What actions can I take to prevent this? Nutrition  Eat less saturated fat.  Avoid trans fats (partially hydrogenated oils). These are often found in margarine and in some baked goods, fried foods, and snacks bought in packages.  Avoid precooked or cured meat, such as bacon, sausages, or meat loaves.  Avoid foods and drinks that have added sugars.  Eat more fruits, vegetables, and whole grains.  Choose healthy sources of protein, such as fish, poultry, lean cuts of red meat, beans, peas, lentils, and nuts.  Choose healthy sources of fat, such as: ? Nuts. ? Vegetable oils, especially olive oil. ? Fish that have healthy fats, such as omega-3 fatty acids. These fish include mackerel or salmon.   Lifestyle  Lose weight if you are overweight. Maintaining a healthy body mass index (BMI) can help prevent or control high cholesterol. It can also lower your risk for diabetes and high blood pressure. Ask your health care provider to help you with a diet and exercise plan to lose weight safely.  Do not use any products that contain nicotine or tobacco, such as cigarettes, e-cigarettes, and chewing tobacco. If you need help quitting, ask your health care provider. Alcohol use  Do not drink alcohol if: ? Your health care provider tells you not to drink. ? You are pregnant, may be pregnant, or are planning to become pregnant.  If you drink alcohol: ? Limit how much you use to:  0-1 drink a day for women.  0-2 drinks a day for men. ? Be aware of how much alcohol is in your drink. In the [place] one drink equals one 12 oz bottle of beer (355 mL), one 5 oz glass of  wine (148 mL), or one 1 oz glass of hard liquor (44 mL). Activity  Get enough exercise. Do exercises as told by your health care provider.  Each week, do at least 150 minutes of exercise that takes a medium level of effort (moderate-intensity exercise). This kind of exercise: ? Makes your heart beat faster while allowing you to still be able to talk. ? Can be done in short sessions several times a day or longer sessions a few times a week. For example, on 5 days each week, you could walk fast or ride your bike 3 times a day for 10 minutes each time.   Medicines  Your health care provider may recommend medicines to help lower cholesterol. This may be a medicine to lower the amount of cholesterol that your liver makes. You may need medicine if: ? Diet and lifestyle changes have not lowered your cholesterol enough. ? You have high cholesterol and other risk factors for heart disease or stroke.  Take over-the-counter and prescription medicines only as told by your health care provider. General information  Manage your risk factors for high cholesterol. Talk with your health care provider about all your risk factors and how to lower your risk.  Manage other  conditions that you have, such as diabetes or high blood pressure (hypertension).  Have blood tests to check your cholesterol levels at regular points in time as told by your health care provider.  Keep all follow-up visits as told by your health care provider. This is important. Where to find more information  American Heart Association: www.heart.org  National Heart, Lung, and Blood Institute: https://wilson-eaton.com/ Summary  High cholesterol increases your risk for heart disease and stroke. By keeping your cholesterol level low, you can reduce your risk for these conditions.  High cholesterol can often be prevented with diet and lifestyle changes.  Work with your health care provider to manage your risk factors, and have your blood  tested regularly. This information is not intended to replace advice given to you by your health care provider. Make sure you discuss any questions you have with your health care provider. Document Revised: 12/18/2018 Document Reviewed: 12/18/2018 Elsevier Patient Education  [place].

## 2020-07-09 ENCOUNTER — Other Ambulatory Visit: Payer: Self-pay

## 2020-07-09 ENCOUNTER — Ambulatory Visit: Payer: PPO | Admitting: Orthopaedic Surgery

## 2020-07-09 ENCOUNTER — Encounter: Payer: Self-pay | Admitting: Orthopaedic Surgery

## 2020-07-09 VITALS — Ht 63.0 in | Wt 132.0 lb

## 2020-07-09 DIAGNOSIS — M25551 Pain in right hip: Secondary | ICD-10-CM

## 2020-07-09 DIAGNOSIS — M1711 Unilateral primary osteoarthritis, right knee: Secondary | ICD-10-CM

## 2020-07-09 MED ORDER — LIDOCAINE HCL 1 % IJ SOLN
2.0000 mL | INTRAMUSCULAR | Status: AC | PRN
  Administered 2020-07-09: 2 mL

## 2020-07-09 MED ORDER — METHYLPREDNISOLONE ACETATE 40 MG/ML IJ SUSP
80.0000 mg | INTRAMUSCULAR | Status: AC | PRN
  Administered 2020-07-09: 80 mg via INTRA_ARTICULAR

## 2020-07-09 MED ORDER — BUPIVACAINE HCL 0.25 % IJ SOLN
2.0000 mL | INTRAMUSCULAR | Status: AC | PRN
  Administered 2020-07-09: 2 mL via INTRA_ARTICULAR

## 2020-07-09 NOTE — Progress Notes (Signed)
Office Visit Note   [name]           Date of Birth: 01-16-1946           MRN: [id number] Visit Date: 07/09/2020              Requested by: [name], MD [id number] [facility] [place],  [place] [id number] PCP: [name], MD   Assessment & Plan: Visit Diagnoses:  1. Pain in right hip   2. Unilateral primary osteoarthritis, right knee     Plan: Recurrent symptoms of greater trochanteric bursitis right hip and osteoarthritis right knee.  Will inject both with Depo-Medrol as she has done very well in the past with similar injections.  Return as needed  Follow-Up Instructions: Return if symptoms worsen or fail to improve.   Orders:  Orders Placed This Encounter  Procedures  . Large Joint Inj: R knee  . Large Joint Inj: R greater trochanter   No orders of the defined types were placed in this encounter.     Procedures: Large Joint Inj: R knee on 07/09/2020 2:58 PM Indications: pain and diagnostic evaluation Details: 25 G 1.5 in needle, anteromedial approach  Arthrogram: No  Medications: 2 mL lidocaine 1 %; 80 mg methylPREDNISolone acetate 40 MG/ML; 2 mL bupivacaine 0.25 % Procedure, treatment alternatives, risks and benefits explained, specific risks discussed. Consent was given by the patient. Immediately prior to procedure a time out was called to verify the correct patient, procedure, equipment, support staff and site/side marked as required. Patient was prepped and draped in the usual sterile fashion.   Large Joint Inj: R greater trochanter on 07/09/2020 2:58 PM Indications: pain and diagnostic evaluation Details: 25 G 1.5 in needle  Arthrogram: No  Medications: 2 mL lidocaine 1 %; 80 mg methylPREDNISolone acetate 40 MG/ML; 2 mL bupivacaine 0.25 % Procedure, treatment alternatives, risks and benefits explained, specific risks discussed. Consent was given by the patient. Immediately prior to procedure a time out was called to verify the  correct patient, procedure, equipment, support staff and site/side marked as required. Patient was prepped and draped in the usual sterile fashion.       Clinical Data: No additional findings.   Subjective: Chief Complaint  Patient presents with  . Right Hip - Pain  Patient presents today for recurrent right hip and thigh pain. She was here in October and received a greater trochanter injection. She states that the injection helped for quite some time. She is wanting to get another injection today.   HPI  Review of Systems   Objective: Vital Signs: Ht 5\' 3"  (1.6 m)   Wt 132 lb (59.9 kg)   BMI 23.38 kg/m   Physical Exam Constitutional:      Appearance: She is well-developed.  Eyes:     Pupils: Pupils are equal, round, and reactive to light.  Pulmonary:     Effort: Pulmonary effort is normal.  Skin:    General: Skin is warm and dry.  Neurological:     Mental Status: She is alert and oriented to person, place, and time.  Psychiatric:        Behavior: Behavior normal.     Ortho Exam awake alert and oriented x3.  Comfortable sitting.  Walk without a limp.  Does have local tenderness over the greater trochanter of the right hip without any skin changes or crepitation.  Has mild medial joint pain right knee related to arthritis but no  popping clicking.  No patella pain no effusion.  Full extension flexed over 100 degrees Specialty Comments:  No specialty comments available.  Imaging: No results found.   PMFS History: Patient Active Problem List   Diagnosis Date Noted  . Bilateral primary osteoarthritis of knee 09/26/2019  . Pain in right hip 04/30/2019  . Neck pain on right side 12/25/2018  . Pain in right shoulder 12/11/2018  . Pain in right hand 05/18/2018  . Pain in left hand 05/18/2018  . Unilateral primary osteoarthritis, right knee 08/24/2016  . Hypertriglyceridemia without hypercholesterolemia 09/03/2013  . CHEST PAIN-PRECORDIAL 08/31/2009  . CHEST PAIN,  ATYPICAL 08/28/2009   Past Medical History:  Diagnosis Date  . Allergy    Seasonal  . Arthritis    back  . GERD (gastroesophageal reflux disease)    in past    Family History  Problem Relation Age of Onset  . Parkinson's disease Mother   . Brain cancer Father   . Breast cancer Neg Hx     Past Surgical History:  Procedure Laterality Date  . CERVICAL DISC SURGERY     over 10 years  . COLONOSCOPY     Social History   Occupational History  . Not on file  Tobacco Use  . Smoking status: Never Smoker  . Smokeless tobacco: Never Used  Vaping Use  . Vaping Use: Never used  Substance and Sexual Activity  . Alcohol use: No  . Drug use: No  . Sexual activity: Not on file

## 2020-07-20 ENCOUNTER — Other Ambulatory Visit: Payer: PPO

## 2020-07-20 ENCOUNTER — Other Ambulatory Visit: Payer: Self-pay

## 2020-07-20 DIAGNOSIS — E781 Pure hyperglyceridemia: Secondary | ICD-10-CM | POA: Diagnosis not present

## 2020-07-21 ENCOUNTER — Other Ambulatory Visit: Payer: PPO

## 2020-07-21 DIAGNOSIS — H04123 Dry eye syndrome of bilateral lacrimal glands: Secondary | ICD-10-CM | POA: Diagnosis not present

## 2020-07-21 DIAGNOSIS — H25013 Cortical age-related cataract, bilateral: Secondary | ICD-10-CM | POA: Diagnosis not present

## 2020-07-21 LAB — COMPLETE METABOLIC PANEL WITH GFR
AG Ratio: 1.8 (calc) (ref 1.0–2.5)
ALT: 14 U/L (ref 6–29)
AST: 13 U/L (ref 10–35)
Albumin: 4.2 g/dL (ref 3.6–5.1)
Alkaline phosphatase (APISO): 41 U/L (ref 37–153)
BUN: 25 mg/dL (ref 7–25)
CO2: 27 mmol/L (ref 20–32)
Calcium: 9.4 mg/dL (ref 8.6–10.4)
Chloride: 104 mmol/L (ref 98–110)
Creat: 0.81 mg/dL (ref 0.60–0.93)
GFR, Est African American: 83 mL/min/{1.73_m2} (ref >=60)
GFR, Est Non African American: 72 mL/min/{1.73_m2} (ref >=60)
Globulin: 2.3 g/dL (calc) (ref 1.9–3.7)
Glucose, Bld: 80 mg/dL (ref 65–99)
Potassium: 4.2 mmol/L (ref 3.5–5.3)
Sodium: 140 mmol/L (ref 135–146)
Total Bilirubin: 0.5 mg/dL (ref 0.2–1.2)
Total Protein: 6.5 g/dL (ref 6.1–8.1)

## 2020-07-21 LAB — LIPID PANEL
Cholesterol: 193 mg/dL (ref <200)
HDL: 84 mg/dL (ref >=50)
LDL Cholesterol (Calc): 94 mg/dL (calc)
Non-HDL Cholesterol (Calc): 109 mg/dL (calc) (ref <130)
Total CHOL/HDL Ratio: 2.3 (calc) (ref <5.0)
Triglycerides: 60 mg/dL (ref <150)

## 2020-07-21 LAB — HEMOGLOBIN A1C
Hgb A1c MFr Bld: 5.4 % of total Hgb (ref <5.7)
Mean Plasma Glucose: 108 mg/dL
eAG (mmol/L): 6 mmol/L

## 2020-07-22 NOTE — Progress Notes (Addendum)
Subjective:   Taylor Taylor is a 75 y.o. female who presents for Medicare Annual (Subsequent) preventive examination.  Review of Systems    N/A  Cardiac Risk Factors include: advanced age (>84men, >61 women)     Objective:    Today's Vitals   07/23/20 0950  BP: 100/60  Pulse: 76  Temp: 98.3 F (36.8 C)  TempSrc: Oral  SpO2: 97%  Weight: 126 lb 8 oz (57.4 kg)  Height: 5\' 3"  (1.6 m)   Body mass index is 22.41 kg/m.  Advanced Directives 07/23/2020 12/24/2018  Does Patient Have a Medical Advance Directive? Yes Yes  Type of Paramedic of [place];Living will [place];Living will  Does patient want to make changes to medical advance directive? No - Patient declined -  Copy of [place] in Chart? No - copy requested -    Current Medications (verified) Outpatient Encounter Medications as of 07/23/2020  Medication Sig  . acetaminophen (TYLENOL) 500 MG tablet Take 500 mg by mouth 2 (two) times daily.  . cholecalciferol (VITAMIN D3) 25 MCG (1000 UNIT) tablet Take 125 mcg by mouth daily.  . diclofenac (VOLTAREN) 75 MG EC tablet Take 1 tablet (75 mg total) by mouth 2 (two) times daily.  . ferrous sulfate 325 (65 FE) MG tablet Take 325 mg by mouth daily with breakfast.  . Fish Oil-Cholecalciferol (FISH OIL + D3) 1200-1000 MG-UNIT CAPS Take 1 tablet by mouth daily.  . Glucosamine-Chondroit-Vit C-Mn (GLUCOSAMINE CHONDR 1500 COMPLX) CAPS Take 1 capsule by mouth 2 (two) times daily.  . Melatonin 10 MG CAPS Take 10 mg by mouth at bedtime as needed (sleep).   . Multiple Vitamins-Minerals (WOMENS MULTI VITAMIN & MINERAL PO) Take 1 tablet by mouth daily.  . Red Yeast Rice 600 MG CAPS Take 600 mg by mouth daily.   . Turmeric 500 MG TABS Take 500 mg by mouth daily.   Marland Kitchen venlafaxine XR (EFFEXOR XR) 150 MG 24 hr capsule Take 1 capsule (150 mg total) by mouth daily with breakfast.  . vitamin B-12 (CYANOCOBALAMIN) 100 MCG tablet  Take 100 mcg by mouth daily.  . vitamin E 100 UNIT capsule Take 100 Units by mouth daily.   Facility-Administered Encounter Medications as of 07/23/2020  Medication  . 0.9 %  sodium chloride infusion    Allergies (verified) Pollen extract   History: Past Medical History:  Diagnosis Date  . Allergy    Seasonal  . Arthritis    back  . GERD (gastroesophageal reflux disease)    in past   Past Surgical History:  Procedure Laterality Date  . CERVICAL DISC SURGERY     over 10 years  . COLONOSCOPY     Family History  Problem Relation Age of Onset  . Parkinson's disease Mother   . Brain cancer Father   . Breast cancer Neg Hx    Social History   Socioeconomic History  . Marital status: Married    Spouse name: Not on file  . Number of children: Not on file  . Years of education: Not on file  . Highest education level: Not on file  Occupational History  . Not on file  Tobacco Use  . Smoking status: Never Smoker  . Smokeless tobacco: Never Used  Vaping Use  . Vaping Use: Never used  Substance and Sexual Activity  . Alcohol use: No  . Drug use: No  . Sexual activity: Not on file  Other Topics Concern  .  Not on file  Social History Narrative  . Not on file   Social Determinants of Health   Financial Resource Strain: Low Risk   . Difficulty of Paying Living Expenses: Not hard at all  Food Insecurity: No Food Insecurity  . Worried About Charity fundraiser in the Last Year: Never true  . Ran Out of Food in the Last Year: Never true  Transportation Needs: No Transportation Needs  . Lack of Transportation (Medical): No  . Lack of Transportation (Non-Medical): No  Physical Activity: Sufficiently Active  . Days of Exercise per Week: 5 days  . Minutes of Exercise per Session: 70 min  Stress: No Stress Concern Present  . Feeling of Stress : Not at all  Social Connections: Moderately Integrated  . Frequency of Communication with Friends and Family: More than three times  a week  . Frequency of Social Gatherings with Friends and Family: Three times a week  . Attends Religious Services: More than 4 times per year  . Active Member of Clubs or Organizations: No  . Attends Archivist Meetings: Never  . Marital Status: Married    Tobacco Counseling Counseling given: Not Answered   Clinical Intake:  Pre-visit preparation completed: Yes  Pain : No/denies pain     Nutritional Risks: None Diabetes: No  How often do you need to have someone help you when you read instructions, pamphlets, or other written materials from your doctor or pharmacy?: 1 - Never  Diabetic? No   Interpreter Needed?: No  Information entered by :: [place] of Daily Living In your present state of health, do you have any difficulty performing the following activities: 07/23/2020  Hearing? N  Vision? N  Difficulty concentrating or making decisions? N  Walking or climbing stairs? N  Dressing or bathing? N  Doing errands, shopping? N  Preparing Food and eating ? N  Using the Toilet? N  In the past six months, have you accidently leaked urine? N  Do you have problems with loss of bowel control? N  Managing your Medications? N  Managing your Finances? N  Housekeeping or managing your Housekeeping? N  Some recent data might be hidden    Patient Care Team: [name], MD as PCP - General (Family Medicine) Edythe Clarity, [facility] as Pharmacist (Pharmacist)  Indicate any recent Medical Services you may have received from other than [facility] providers in the past year (date may be approximate).     Assessment:   This is a routine wellness examination for Taylor Taylor.  Hearing/Vision screen  Hearing Screening   125Hz  250Hz  500Hz  1000Hz  2000Hz  3000Hz  4000Hz  6000Hz  8000Hz   Right ear:           Left ear:           Vision Screening Comments: Patient gets eye examined frequently due to dry eye disease. Wears glasses   Dietary issues and exercise  activities discussed: Current Exercise Habits: Home exercise routine, Type of exercise: walking;strength training/weights, Time (Minutes): 60, Frequency (Times/Week): 5, Weekly Exercise (Minutes/Week): 300, Intensity: Moderate  Goals Addressed   None    Depression Screen PHQ 2/9 Scores 07/23/2020 07/15/2019 02/21/2018 05/25/2017 05/25/2017 09/03/2013  PHQ - 2 Score 0 0 0 0 0 0  PHQ- 9 Score - - 4 - - -    Fall Risk Fall Risk  07/23/2020 07/15/2019 02/21/2018 05/25/2017 05/25/2017  Falls in the past year? 0 0 0 No No  Number falls in past yr:  0 - 0 - -  Injury with Fall? 0 - 0 - -  Risk for fall due to : No Fall Risks - - - -  Follow up Falls evaluation completed;Falls prevention discussed Falls evaluation completed Falls evaluation completed - -    FALL RISK PREVENTION PERTAINING TO THE HOME:  Any stairs in or around the home? No  If so, are there any without handrails? No  Home free of loose throw rugs in walkways, pet beds, electrical cords, etc? Yes  Adequate lighting in your home to reduce risk of falls? Yes   ASSISTIVE DEVICES UTILIZED TO PREVENT FALLS:  Life alert? No  Use of a cane, walker or w/c? No  Grab bars in the bathroom? No  Shower chair or bench in shower? Yes  Elevated toilet seat or a handicapped toilet? No   TIMED UP AND GO:  Was the test performed? Yes .  Length of time to ambulate 10 feet: 3 sec.   Gait steady and fast without use of assistive device  Cognitive Function:     Normal cognitive status assessed by direct observation by this Nurse Health Advisor. No abnormalities found.      Immunizations Immunization History  Administered Date(s) Administered  . Fluad Quad(high Dose 65+) 12/06/2018  . Hepatitis A 12/10/1997, 09/17/1998  . Hepatitis B 12/10/1997, 01/09/1998, 08/11/1998  . Hepatitis B, ped/adol 12/10/1997, 01/09/1998, 08/11/1998  . Influenza Inj Mdck Quad With Preservative 11/19/2016, 11/20/2019  . Influenza Split 01/18/2012  . Influenza, High  Dose Seasonal PF 12/22/2015, 10/27/2016, 12/15/2017, 12/06/2018  . Influenza,inj,Quad PF,6+ Mos 01/01/2013, 01/07/2014, 12/30/2014  . Influenza-Unspecified 12/20/2014, 10/27/2016  . PFIZER(Purple Top)SARS-COV-2 Vaccination 04/12/2019, 05/02/2019, 07/20/2020  . Pneumococcal Conjugate-13 07/17/2014  . Pneumococcal Polysaccharide-23 07/12/2011  . Td 12/09/1997  . Tdap 07/12/2011  . Zoster 02/26/2010  . Zoster Recombinat (Shingrix) 08/12/2016, 12/24/2016    TDAP status: Up to date  Flu Vaccine status: Up to date  Pneumococcal vaccine status: Up to date  Covid-19 vaccine status: Completed vaccines  Qualifies for Shingles Vaccine? Yes   Zostavax completed Yes   Shingrix Completed?: Yes  Screening Tests Health Maintenance  Topic Date Due  . MAMMOGRAM  09/30/2020  . INFLUENZA VACCINE  10/19/2020  . TETANUS/TDAP  07/11/2021  . DEXA SCAN  Completed  . COVID-19 Vaccine  Completed  . Hepatitis C Screening  Completed  . PNA vac Low Risk Adult  Completed  . HPV VACCINES  Aged Out    Health Maintenance  There are no preventive care reminders to display for this patient.  Colorectal cancer screening: No longer required.   Mammogram status: Completed 10/01/2019. Repeat every year  Bone Density status: Completed 09/20/2018. Results reflect: Bone density results: NORMAL. Repeat every 0 years.  Lung Cancer Screening: (Low Dose CT Chest recommended if Age 79-80 years, 30 pack-year currently smoking OR have quit w/in 15years.) does not qualify.   Lung Cancer Screening Referral: N/A   Additional Screening:  Hepatitis C Screening: does qualify; Completed 11/12/2015  Vision Screening: Recommended annual ophthalmology exams for early detection of glaucoma and other disorders of the eye. Is the patient up to date with their annual eye exam?  Yes  Who is the provider or what is the name of the office in which the patient attends annual eye exams? Dr. Valetta Close  If pt is not established with  a provider, would they like to be referred to a provider to establish care? No .   Dental Screening: Recommended annual dental exams  for proper oral hygiene  Community Resource Referral / Chronic Care Management: CRR required this visit?  No   CCM required this visit?  No      Plan:     I have personally reviewed and noted the following in the patient's chart:   . Medical and social history . Use of alcohol, tobacco or illicit drugs  . Current medications and supplements including opioid prescriptions.  . Functional ability and status . Nutritional status . Physical activity . Advanced directives . List of other physicians . Hospitalizations, surgeries, and ER visits in previous 12 months . Vitals . Screenings to include cognitive, depression, and falls . Referrals and appointments  In addition, I have reviewed and discussed with patient certain preventive protocols, quality metrics, and best practice recommendations. A written personalized care plan for preventive services as well as general preventive health recommendations were provided to patient.     Ofilia Neas, LPN   10/20/4233   Nurse Notes: None

## 2020-07-23 ENCOUNTER — Telehealth: Payer: Self-pay | Admitting: Pharmacist

## 2020-07-23 ENCOUNTER — Ambulatory Visit (INDEPENDENT_AMBULATORY_CARE_PROVIDER_SITE_OTHER): Payer: PPO

## 2020-07-23 ENCOUNTER — Other Ambulatory Visit: Payer: Self-pay

## 2020-07-23 VITALS — BP 100/60 | HR 76 | Temp 98.3°F | Ht 63.0 in | Wt 126.5 lb

## 2020-07-23 DIAGNOSIS — Z0000 Encounter for general adult medical examination without abnormal findings: Secondary | ICD-10-CM

## 2020-07-23 NOTE — Patient Instructions (Signed)
Taylor Taylor , Thank you for taking time to come for your Medicare Wellness Visit. I appreciate your ongoing commitment to your health goals. Please review the following plan we discussed and let me know if I can assist you in the future.   Screening recommendations/referrals: Colonoscopy: No longer required  Mammogram: Up to date, next due 09/30/2020 Bone Density: Up to date, next due 09/20/2023 Recommended yearly ophthalmology/optometry visit for glaucoma screening and checkup Recommended yearly dental visit for hygiene and checkup  Vaccinations: Influenza vaccine: Up to date, next due fall 2022  Pneumococcal vaccine: Completed series  Tdap vaccine: Up to date, next due 07/11/2021 Shingles vaccine: Completed series     Advanced directives: please bring in copies of your advanced medical directives so that we may scan them into your chart.   Conditions/risks identified: None   Next appointment: 07/29/2021 @ 9:45 am with [place] 65 Years and Older, Female Preventive care refers to lifestyle choices and visits with your health care provider that can promote health and wellness. What does preventive care include?  A yearly physical exam. This is also called an annual well check.  Dental exams once or twice a year.  Routine eye exams. Ask your health care provider how often you should have your eyes checked.  Personal lifestyle choices, including:  Daily care of your teeth and gums.  Regular physical activity.  Eating a healthy diet.  Avoiding tobacco and drug use.  Limiting alcohol use.  Practicing safe sex.  Taking low-dose aspirin every day.  Taking vitamin and mineral supplements as recommended by your health care provider. What happens during an annual well check? The services and screenings done by your health care provider during your annual well check will depend on your age, overall health, lifestyle risk factors, and family  history of disease. Counseling  Your health care provider may ask you questions about your:  Alcohol use.  Tobacco use.  Drug use.  Emotional well-being.  Home and relationship well-being.  Sexual activity.  Eating habits.  History of falls.  Memory and ability to understand (cognition).  Work and work Statistician.  Reproductive health. Screening  You may have the following tests or measurements:  Height, weight, and BMI.  Blood pressure.  Lipid and cholesterol levels. These may be checked every 5 years, or more frequently if you are over 38 years old.  Skin check.  Lung cancer screening. You may have this screening every year starting at age 1 if you have a 30-pack-year history of smoking and currently smoke or have quit within the past 15 years.  Fecal occult blood test (FOBT) of the stool. You may have this test every year starting at age 20.  Flexible sigmoidoscopy or colonoscopy. You may have a sigmoidoscopy every 5 years or a colonoscopy every 10 years starting at age 54.  Hepatitis C blood test.  Hepatitis B blood test.  Sexually transmitted disease (STD) testing.  Diabetes screening. This is done by checking your blood sugar (glucose) after you have not eaten for a while (fasting). You may have this done every 1-3 years.  Bone density scan. This is done to screen for osteoporosis. You may have this done starting at age 2.  Mammogram. This may be done every 1-2 years. Talk to your health care provider about how often you should have regular mammograms. Talk with your health care provider about your test results, treatment options, and if necessary, the need for more  tests. Vaccines  Your health care provider may recommend certain vaccines, such as:  Influenza vaccine. This is recommended every year.  Tetanus, diphtheria, and acellular pertussis (Tdap, Td) vaccine. You may need a Td booster every 10 years.  Zoster vaccine. You may need this after  age 33.  Pneumococcal 13-valent conjugate (PCV13) vaccine. One dose is recommended after age 76.  Pneumococcal polysaccharide (PPSV23) vaccine. One dose is recommended after age 74. Talk to your health care provider about which screenings and vaccines you need and how often you need them. This information is not intended to replace advice given to you by your health care provider. Make sure you discuss any questions you have with your health care provider. Document Released: 04/03/2015 Document Revised: 11/25/2015 Document Reviewed: 01/06/2015 Elsevier Interactive Patient Education  2017 [place] Prevention in the Home Falls can cause injuries. They can happen to people of all ages. There are many things you can do to make your home safe and to help prevent falls. What can I do on the outside of my home?  Regularly fix the edges of walkways and driveways and fix any cracks.  Remove anything that might make you trip as you walk through a door, such as a raised step or threshold.  Trim any bushes or trees on the path to your home.  Use bright outdoor lighting.  Clear any walking paths of anything that might make someone trip, such as rocks or tools.  Regularly check to see if handrails are loose or broken. Make sure that both sides of any steps have handrails.  Any raised decks and porches should have guardrails on the edges.  Have any leaves, snow, or ice cleared regularly.  Use sand or salt on walking paths during winter.  Clean up any spills in your garage right away. This includes oil or grease spills. What can I do in the bathroom?  Use night lights.  Install grab bars by the toilet and in the tub and shower. Do not use towel bars as grab bars.  Use non-skid mats or decals in the tub or shower.  If you need to sit down in the shower, use a plastic, non-slip stool.  Keep the floor dry. Clean up any water that spills on the floor as soon as it  happens.  Remove soap buildup in the tub or shower regularly.  Attach bath mats securely with double-sided non-slip rug tape.  Do not have throw rugs and other things on the floor that can make you trip. What can I do in the bedroom?  Use night lights.  Make sure that you have a light by your bed that is easy to reach.  Do not use any sheets or blankets that are too big for your bed. They should not hang down onto the floor.  Have a firm chair that has side arms. You can use this for support while you get dressed.  Do not have throw rugs and other things on the floor that can make you trip. What can I do in the kitchen?  Clean up any spills right away.  Avoid walking on wet floors.  Keep items that you use a lot in easy-to-reach places.  If you need to reach something above you, use a strong step stool that has a grab bar.  Keep electrical cords out of the way.  Do not use floor polish or wax that makes floors slippery. If you must use wax, use non-skid floor  wax.  Do not have throw rugs and other things on the floor that can make you trip. What can I do with my stairs?  Do not leave any items on the stairs.  Make sure that there are handrails on both sides of the stairs and use them. Fix handrails that are broken or loose. Make sure that handrails are as long as the stairways.  Check any carpeting to make sure that it is firmly attached to the stairs. Fix any carpet that is loose or worn.  Avoid having throw rugs at the top or bottom of the stairs. If you do have throw rugs, attach them to the floor with carpet tape.  Make sure that you have a light switch at the top of the stairs and the bottom of the stairs. If you do not have them, ask someone to add them for you. What else can I do to help prevent falls?  Wear shoes that:  Do not have high heels.  Have rubber bottoms.  Are comfortable and fit you well.  Are closed at the toe. Do not wear sandals.  If you  use a stepladder:  Make sure that it is fully opened. Do not climb a closed stepladder.  Make sure that both sides of the stepladder are locked into place.  Ask someone to hold it for you, if possible.  Clearly mark and make sure that you can see:  Any grab bars or handrails.  First and last steps.  Where the edge of each step is.  Use tools that help you move around (mobility aids) if they are needed. These include:  Canes.  Walkers.  Scooters.  Crutches.  Turn on the lights when you go into a dark area. Replace any light bulbs as soon as they burn out.  Set up your furniture so you have a clear path. Avoid moving your furniture around.  If any of your floors are uneven, fix them.  If there are any pets around you, be aware of where they are.  Review your medicines with your doctor. Some medicines can make you feel dizzy. This can increase your chance of falling. Ask your doctor what other things that you can do to help prevent falls. This information is not intended to replace advice given to you by your health care provider. Make sure you discuss any questions you have with your health care provider. Document Released: 01/01/2009 Document Revised: 08/13/2015 Document Reviewed: 04/11/2014 Elsevier Interactive Patient Education  2017 [employer].

## 2020-07-23 NOTE — Progress Notes (Addendum)
    Chronic Care Management Pharmacy Assistant   Name: Taylor Taylor  MRN: [id number] DOB: 1945/07/23  Reason for Encounter: Medication Review   Conditions to be addressed/monitored: Osteoarthritis, HLD.   Recent office visits:  None since 07/02/20  Recent consult visits:  07/09/20 Orthopedics Garald Balding, MD. For Pain in right hip and right knee. Per note: depo-medrol was injection in right knee. No medication changes.   Hospital visits:  None since 07/02/20   Medications: Outpatient Encounter Medications as of 07/23/2020  Medication Sig   acetaminophen (TYLENOL) 500 MG tablet Take 500 mg by mouth 2 (two) times daily.   cholecalciferol (VITAMIN D3) 25 MCG (1000 UNIT) tablet Take 125 mcg by mouth daily.   diclofenac (VOLTAREN) 75 MG EC tablet Take 1 tablet (75 mg total) by mouth 2 (two) times daily.   ferrous sulfate 325 (65 FE) MG tablet Take 325 mg by mouth daily with breakfast.   Fish Oil-Cholecalciferol (FISH OIL + D3) 1200-1000 MG-UNIT CAPS Take 1 tablet by mouth daily.   Glucosamine-Chondroit-Vit C-Mn (GLUCOSAMINE CHONDR 1500 COMPLX) CAPS Take 1 capsule by mouth 2 (two) times daily.   Melatonin 10 MG CAPS Take 10 mg by mouth at bedtime as needed (sleep).    Multiple Vitamins-Minerals (WOMENS MULTI VITAMIN & MINERAL PO) Take 1 tablet by mouth daily.   Red Yeast Rice 600 MG CAPS Take 600 mg by mouth daily.    Turmeric 500 MG TABS Take 500 mg by mouth daily.    venlafaxine XR (EFFEXOR XR) 150 MG 24 hr capsule Take 1 capsule (150 mg total) by mouth daily with breakfast.   vitamin B-12 (CYANOCOBALAMIN) 100 MCG tablet Take 100 mcg by mouth daily.   vitamin E 100 UNIT capsule Take 100 Units by mouth daily.   Facility-Administered Encounter Medications as of 07/23/2020  Medication   0.9 %  sodium chloride infusion   Patient requested a refill for myrbetriq. I called the office to request a refill be sent to [facility]. They informed me that the patient was over due  for her follow-up and would have to be seen before any refills was sent over. Informed the patient and she voiced understanding and stated she would call them soon and schedule a follow up appointment.    Follow-Up:Pharmacist Review  Charlann Lange, RMA Clinical Pharmacist Assistant [phone]  5 minutes spent in review, coordination, and documentation.  Reviewed by: Beverly Milch, PharmD Clinical Pharmacist [facility] [phone]

## 2020-08-03 DIAGNOSIS — R350 Frequency of micturition: Secondary | ICD-10-CM | POA: Diagnosis not present

## 2020-08-03 DIAGNOSIS — R3915 Urgency of urination: Secondary | ICD-10-CM | POA: Diagnosis not present

## 2020-08-03 DIAGNOSIS — R351 Nocturia: Secondary | ICD-10-CM | POA: Diagnosis not present

## 2020-08-03 DIAGNOSIS — N3946 Mixed incontinence: Secondary | ICD-10-CM | POA: Diagnosis not present

## 2020-08-19 ENCOUNTER — Other Ambulatory Visit: Payer: Self-pay | Admitting: Family Medicine

## 2020-08-19 ENCOUNTER — Telehealth: Payer: Self-pay | Admitting: Pharmacist

## 2020-08-19 DIAGNOSIS — Z1231 Encounter for screening mammogram for malignant neoplasm of breast: Secondary | ICD-10-CM

## 2020-08-19 NOTE — Progress Notes (Addendum)
    Chronic Care Management Pharmacy Assistant   Name: Taylor Taylor  MRN: [id number] DOB: [birth year]   Reason for Encounter: Medication Review/Medication Coordination Call.  Medications: Outpatient Encounter Medications as of 08/19/2020  Medication Sig   acetaminophen (TYLENOL) 500 MG tablet Take 500 mg by mouth 2 (two) times daily.   cholecalciferol (VITAMIN D3) 25 MCG (1000 UNIT) tablet Take 125 mcg by mouth daily.   diclofenac (VOLTAREN) 75 MG EC tablet Take 1 tablet (75 mg total) by mouth 2 (two) times daily.   ferrous sulfate 325 (65 FE) MG tablet Take 325 mg by mouth daily with breakfast.   Fish Oil-Cholecalciferol (FISH OIL + D3) 1200-1000 MG-UNIT CAPS Take 1 tablet by mouth daily.   Glucosamine-Chondroit-Vit C-Mn (GLUCOSAMINE CHONDR 1500 COMPLX) CAPS Take 1 capsule by mouth 2 (two) times daily.   Melatonin 10 MG CAPS Take 10 mg by mouth at bedtime as needed (sleep).    Multiple Vitamins-Minerals (WOMENS MULTI VITAMIN & MINERAL PO) Take 1 tablet by mouth daily.   Red Yeast Rice 600 MG CAPS Take 600 mg by mouth daily.    Turmeric 500 MG TABS Take 500 mg by mouth daily.    venlafaxine XR (EFFEXOR XR) 150 MG 24 hr capsule Take 1 capsule (150 mg total) by mouth daily with breakfast.   vitamin B-12 (CYANOCOBALAMIN) 100 MCG tablet Take 100 mcg by mouth daily.   vitamin E 100 UNIT capsule Take 100 Units by mouth daily.   Facility-Administered Encounter Medications as of 08/19/2020  Medication   0.9 %  sodium chloride infusion   Reviewed chart for medication changes ahead of medication coordination call.  No OVs, Consults, or hospital visits since last care coordination call.  No medication changes indicated.  BP Readings from Last 3 Encounters:  07/23/20 100/60  03/02/20 130/66  07/15/19 132/68    Lab Results  Component Value Date   HGBA1C 5.4 07/20/2020     Patient obtains medications through Vials  90 Days   Last adherence delivery included:  Myrbetriq 50 mg    Patient declined meds last month Venlafaxine ER 150 mg Diclofenac 75 mg   Patient is due for next adherence delivery on: 08/27/20.  Called patient and reviewed medications and coordinated delivery.  This delivery to include: Venlafaxine ER 150 mg  Patient declined the following medications: Myrbetriq 50 mg  Diclofenac 75 mg  Patient needs refills for: Venlafaxine ER 150 mg-requested.  Confirmed delivery date of 08/27/20, advised patient that pharmacy will contact them the morning of delivery.  Follow-Up:Pharmacist Review  Charlann Lange, RMA Clinical Pharmacist Assistant [phone]  10 minutes spent in review, coordination, and documentation.  Reviewed by: Beverly Milch, PharmD Clinical Pharmacist [facility] [phone]

## 2020-08-20 ENCOUNTER — Telehealth: Payer: Self-pay | Admitting: *Deleted

## 2020-08-20 MED ORDER — VENLAFAXINE HCL ER 150 MG PO CP24: 150.0000 mg | ORAL_CAPSULE | Freq: Every day | ORAL | 1 refills | Status: DC

## 2020-08-20 NOTE — Telephone Encounter (Signed)
-----   Message from Rosetta Posner sent at 08/20/2020  3:40 PM EDT ----- Regarding: Medication Refill Hi can you send a refill for venlafaxine ER 150 mg to [facility] for a 90 DS.

## 2020-08-20 NOTE — Telephone Encounter (Signed)
Prescription sent to pharmacy.

## 2020-08-25 DIAGNOSIS — H2513 Age-related nuclear cataract, bilateral: Secondary | ICD-10-CM | POA: Diagnosis not present

## 2020-10-14 ENCOUNTER — Ambulatory Visit
Admission: RE | Admit: 2020-10-14 | Discharge: 2020-10-14 | Disposition: A | Discharge status: Home / Self Care | Payer: PPO | Source: Ambulatory Visit | Attending: Family Medicine | Admitting: Family Medicine

## 2020-10-14 ENCOUNTER — Other Ambulatory Visit: Payer: Self-pay

## 2020-10-14 DIAGNOSIS — Z1231 Encounter for screening mammogram for malignant neoplasm of breast: Secondary | ICD-10-CM

## 2020-10-15 ENCOUNTER — Encounter: Payer: Self-pay | Admitting: Orthopaedic Surgery

## 2020-10-15 ENCOUNTER — Ambulatory Visit: Payer: PPO | Admitting: Orthopaedic Surgery

## 2020-10-15 ENCOUNTER — Ambulatory Visit (INDEPENDENT_AMBULATORY_CARE_PROVIDER_SITE_OTHER): Payer: PPO

## 2020-10-15 VITALS — Ht 63.0 in | Wt 126.0 lb

## 2020-10-15 DIAGNOSIS — M25551 Pain in right hip: Secondary | ICD-10-CM

## 2020-10-15 DIAGNOSIS — M25521 Pain in right elbow: Secondary | ICD-10-CM

## 2020-10-15 HISTORY — DX: Pain in right elbow: M25.521

## 2020-10-15 MED ORDER — METHYLPREDNISOLONE ACETATE 40 MG/ML IJ SUSP
40.0000 mg | INTRAMUSCULAR | Status: AC | PRN
  Administered 2020-10-15: 40 mg via INTRAMUSCULAR

## 2020-10-15 MED ORDER — LIDOCAINE HCL 2 % IJ SOLN
2.0000 mg | INTRAMUSCULAR | Status: AC | PRN
  Administered 2020-10-15: 2 mg

## 2020-10-15 NOTE — Progress Notes (Signed)
Office Visit Note   [name]           Date of Birth: 05-20-1945           MRN: [id number] Visit Date: 10/15/2020              Requested by: [name], MD [address] [facility] [place],  [place] [address] PCP: [name], MD   Assessment & Plan: Visit Diagnoses:  1. Pain in right elbow   2. Pain in right hip     Plan: Medial epicondylitis right elbow related to working with machines at a gym.  No evidence of median or ulnar nerve compression.  Have suggested Voltaren gel and avoid activities creating the pain I think the rest alone will make a difference.  Been having some recurrent pain in area of trigger point tenderness below the greater trochanter laterally right hip.  Will inject this with cortisone as an has helped in the past  Follow-Up Instructions: Return if symptoms worsen or fail to improve.   Orders:  Orders Placed This Encounter  Procedures   XR Elbow 2 Views Right   No orders of the defined types were placed in this encounter.     Procedures: Trigger Point Inj  Date/Time: 10/15/2020 3:16 PM Performed by: Garald Balding, MD Authorized by: Garald Balding, MD   Indications:  Pain Total # of Trigger Points:  1 Location: hip   Needle Size:  25 G Approach:  Lateral Medications #1:  2 mg lidocaine 2 %; 40 mg methylPREDNISolone acetate 40 MG/ML   Clinical Data: No additional findings.   Subjective: Chief Complaint  Patient presents with   Right Elbow - Pain   Right Leg - Pain  Patient presents today for recurrent right upper leg pain. She received a trochanteric bursa injection in April and would like to get another one today. She also has complaints of right medial elbow pain. She noticed in  while at the gym a month ago. She said that there was no injury, but was on a machine with weights and bumped her elbow while pushing arms forward and together. She said that she cannot do that machine anymore due to pain.  She is right hand dominant. She is taking Tylenol as needed.  HPI  Review of Systems   Objective: Vital Signs: Ht '5\' 3"'$  (1.6 m)   Wt 126 lb (57.2 kg)   BMI 22.32 kg/m   Physical Exam Constitutional:      Appearance: She is well-developed.  Eyes:     Pupils: Pupils are equal, round, and reactive to light.  Pulmonary:     Effort: Pulmonary effort is normal.  Skin:    General: Skin is warm and dry.  Neurological:     Mental Status: She is alert and oriented to person, place, and time.  Psychiatric:        Behavior: Behavior normal.    Ortho Exam right elbow tenderness over the medial epicondyle but without skin change or erythema.  No swelling.  No pain referable to the ulnar nerve and no Tinel's.  Full range of motion of the elbow in pronation supination flexion extension.  No pain laterally.  Tenderness over the medial epicondyles is mild.  Some pain with grip in flexion. Localized areas of trigger point tenderness along the vastus lateralis muscle inferior to the greater trochanter right hip.  No masses no skin changes  Specialty Comments:  No specialty  comments available.  Imaging: XR Elbow 2 Views Right  Result Date: 10/15/2020 Films of the right elbow were obtained in several projections.  There is no evidence of a fat pad sign or acute change.  There is a little ectopic calcification along the lateral epicondyle but patient is symptomatic along the medial epicondyle where there were no abnormalities.  Symptoms are consistent with medial epicondylitis    PMFS History: Patient Active Problem List   Diagnosis Date Noted   Pain in right elbow 10/15/2020   Bilateral primary osteoarthritis of knee 09/26/2019   Pain in right hip 04/30/2019   Neck pain on right side 12/25/2018   Pain in right shoulder 12/11/2018   Pain in right hand 05/18/2018   Pain in left hand 05/18/2018   Unilateral primary osteoarthritis, right knee 08/24/2016   Hypertriglyceridemia without  hypercholesterolemia 09/03/2013   CHEST PAIN-PRECORDIAL 08/31/2009   CHEST PAIN, ATYPICAL 08/28/2009   Past Medical History:  Diagnosis Date   Allergy    Seasonal   Arthritis    back   GERD (gastroesophageal reflux disease)    in past    Family History  Problem Relation Age of Onset   Parkinson's disease Mother    Brain cancer Father    Breast cancer Neg Hx     Past Surgical History:  Procedure Laterality Date   CERVICAL DISC SURGERY     over 10 years   COLONOSCOPY     Social History   Occupational History   Not on file  Tobacco Use   Smoking status: Never   Smokeless tobacco: Never  Vaping Use   Vaping Use: Never used  Substance and Sexual Activity   Alcohol use: No   Drug use: No   Sexual activity: Not on file

## 2020-10-16 ENCOUNTER — Telehealth: Payer: Self-pay

## 2020-10-16 DIAGNOSIS — Z9189 Other specified personal risk factors, not elsewhere classified: Secondary | ICD-10-CM

## 2020-10-16 DIAGNOSIS — Z780 Asymptomatic menopausal state: Secondary | ICD-10-CM

## 2020-10-16 NOTE — Telephone Encounter (Signed)
Ok to order 

## 2020-10-16 NOTE — Telephone Encounter (Signed)
Orders placed.

## 2020-10-16 NOTE — Telephone Encounter (Signed)
Pt called requesting a referral to get a bone density test performed. Please advise  Cb#: [phone]

## 2020-10-28 ENCOUNTER — Ambulatory Visit
Admission: RE | Admit: 2020-10-28 | Discharge: 2020-10-28 | Disposition: A | Discharge status: Home / Self Care | Payer: PPO | Source: Ambulatory Visit | Attending: Family Medicine | Admitting: Family Medicine

## 2020-10-28 ENCOUNTER — Other Ambulatory Visit: Payer: Self-pay

## 2020-10-28 DIAGNOSIS — Z780 Asymptomatic menopausal state: Secondary | ICD-10-CM | POA: Diagnosis not present

## 2020-10-28 DIAGNOSIS — M85852 Other specified disorders of bone density and structure, left thigh: Secondary | ICD-10-CM | POA: Diagnosis not present

## 2020-10-28 DIAGNOSIS — Z9189 Other specified personal risk factors, not elsewhere classified: Secondary | ICD-10-CM

## 2020-11-02 ENCOUNTER — Other Ambulatory Visit: Payer: Self-pay

## 2020-11-02 ENCOUNTER — Ambulatory Visit (INDEPENDENT_AMBULATORY_CARE_PROVIDER_SITE_OTHER): Payer: PPO | Admitting: Family Medicine

## 2020-11-02 VITALS — BP 122/68 | HR 74 | Temp 98.4°F | Resp 16 | Ht 63.0 in | Wt 122.0 lb

## 2020-11-02 DIAGNOSIS — M8580 Other specified disorders of bone density and structure, unspecified site: Secondary | ICD-10-CM | POA: Diagnosis not present

## 2020-11-02 MED ORDER — NEOMYCIN-POLYMYXIN-HC 3.5-10000-1 OT SOLN: 3.0000 [drp] | Freq: Four times a day (QID) | OTIC | 0 refills | Status: DC

## 2020-11-02 NOTE — Progress Notes (Signed)
Subjective:    Patient ID: Taylor Taylor, female    DOB: 1945/05/18, 75 y.o.   MRN: [id number]  HPI  DEXA- ASSESSMENT: The BMD measured at Femur Neck Left is 0.884 g/cm2 with a T-score of -1.1. This patient is considered osteopenic/low bone mass according to [facility]) criteria.   The quality of the exam is good. The lumbar spine was excluded due to degenerative changes. The patient does not meet FRAX criteria.   Site Region Measured Date Measured Age YA BMD Significant CHANGE T-score DualFemur Neck Left 10/28/2020 74.4 -1.1 0.884 g/cm2 * DualFemur Neck Left 09/20/2018 72.3 -0.1 1.030 g/cm2 *   DualFemur Total Mean 10/28/2020 74.4 -0.5 0.943 g/cm2 DualFemur Total Mean 09/20/2018 72.3 -0.3 0.965 g/cm2   Left Forearm Radius 33% 10/28/2020 74.4 -0.4 0.850 g/cm2   Patient is a very pleasant 75 year old Caucasian female who presents today to discuss her bone density test.  In January she discontinued her hormone replacement therapy at the recommendation of her gynecologist.  This could potentially contribute to the decline in her T score compared to 2 years ago.  She is doing weightbearing exercise on a daily basis.  She is taking calcium approximately 1200 mg a day as well as vitamin D.  She would like to discuss these findings because the test result was not clear.  She also reports a squishing sound in her ears.  She states that she started this since Friday.  On examination there is a cerumen impaction in her right auditory canal.  Her left auditory canal is swollen and there appears to be a small amount of white exudate at the posterior canal suggesting swimmer's ear.  I believe that that is what is most likely causing the squishing sound or fluid sound in her ears.  She denies any sinus infection or head congestion Past Medical History:  Diagnosis Date   Allergy    Seasonal   Arthritis    back   GERD (gastroesophageal reflux disease)    in past   Past  Surgical History:  Procedure Laterality Date   CERVICAL DISC SURGERY     over 10 years   COLONOSCOPY     Current Outpatient Medications on File Prior to Visit  Medication Sig Dispense Refill   acetaminophen (TYLENOL) 500 MG tablet Take 500 mg by mouth 2 (two) times daily.     cholecalciferol (VITAMIN D3) 25 MCG (1000 UNIT) tablet Take 125 mcg by mouth daily.     diclofenac (VOLTAREN) 75 MG EC tablet Take 1 tablet (75 mg total) by mouth 2 (two) times daily. 90 tablet 3   ferrous sulfate 325 (65 FE) MG tablet Take 325 mg by mouth daily with breakfast.     Fish Oil-Cholecalciferol (FISH OIL + D3) 1200-1000 MG-UNIT CAPS Take 1 tablet by mouth daily.     Glucosamine-Chondroit-Vit C-Mn (GLUCOSAMINE CHONDR 1500 COMPLX) CAPS Take 1 capsule by mouth 2 (two) times daily.     Melatonin 10 MG CAPS Take 10 mg by mouth at bedtime as needed (sleep).      Multiple Vitamins-Minerals (WOMENS MULTI VITAMIN & MINERAL PO) Take 1 tablet by mouth daily.     Red Yeast Rice 600 MG CAPS Take 600 mg by mouth daily.      Turmeric 500 MG TABS Take 500 mg by mouth daily.      venlafaxine XR (EFFEXOR XR) 150 MG 24 hr capsule Take 1 capsule (150 mg total) by mouth daily with breakfast. 90  capsule 1   vitamin B-12 (CYANOCOBALAMIN) 100 MCG tablet Take 100 mcg by mouth daily.     vitamin E 100 UNIT capsule Take 100 Units by mouth daily.     Current Facility-Administered Medications on File Prior to Visit  Medication Dose Route Frequency Provider Last Rate Last Admin   0.9 %  sodium chloride infusion  500 mL Intravenous Continuous Pyrtle, Lajuan Lines, MD       Allergies  Allergen Reactions   Pollen Extract Itching    sneezing   Social History   Socioeconomic History   Marital status: Married    Spouse name: Not on file   Number of children: Not on file   Years of education: Not on file   Highest education level: Not on file  Occupational History   Not on file  Tobacco Use   Smoking status: Never   Smokeless  tobacco: Never  Vaping Use   Vaping Use: Never used  Substance and Sexual Activity   Alcohol use: No   Drug use: No   Sexual activity: Not on file  Other Topics Concern   Not on file  Social History Narrative   Not on file   Social Determinants of Health   Financial Resource Strain: Low Risk    Difficulty of Paying Living Expenses: Not hard at all  Food Insecurity: No Food Insecurity   Worried About Estate manager/land agent of Food in the Last Year: Never true   Ran Out of Food in the Last Year: Never true  Transportation Needs: No Transportation Needs   Lack of Transportation (Medical): No   Lack of Transportation (Non-Medical): No  Physical Activity: Sufficiently Active   Days of Exercise per Week: 5 days   Minutes of Exercise per Session: 70 min  Stress: No Stress Concern Present   Feeling of Stress : Not at all  Social Connections: Moderately Integrated   Frequency of Communication with Friends and Family: More than three times a week   Frequency of Social Gatherings with Friends and Family: Three times a week   Attends Religious Services: More than 4 times per year   Active Member of Clubs or Organizations: No   Attends Archivist Meetings: Never   Marital Status: Married  Human resources officer Violence: Not At Risk   Fear of Current or Ex-Partner: No   Emotionally Abused: No   Physically Abused: No   Sexually Abused: No    Past Medical History:  Diagnosis Date   Allergy    Seasonal   Arthritis    back   GERD (gastroesophageal reflux disease)    in past   Past Surgical History:  Procedure Laterality Date   CERVICAL DISC SURGERY     over 10 years   COLONOSCOPY     Current Outpatient Medications on File Prior to Visit  Medication Sig Dispense Refill   acetaminophen (TYLENOL) 500 MG tablet Take 500 mg by mouth 2 (two) times daily.     cholecalciferol (VITAMIN D3) 25 MCG (1000 UNIT) tablet Take 125 mcg by mouth daily.     diclofenac (VOLTAREN) 75 MG EC tablet Take  1 tablet (75 mg total) by mouth 2 (two) times daily. 90 tablet 3   ferrous sulfate 325 (65 FE) MG tablet Take 325 mg by mouth daily with breakfast.     Fish Oil-Cholecalciferol (FISH OIL + D3) 1200-1000 MG-UNIT CAPS Take 1 tablet by mouth daily.     Glucosamine-Chondroit-Vit C-Mn (GLUCOSAMINE CHONDR 1500 COMPLX) CAPS Take  1 capsule by mouth 2 (two) times daily.     Melatonin 10 MG CAPS Take 10 mg by mouth at bedtime as needed (sleep).      Multiple Vitamins-Minerals (WOMENS MULTI VITAMIN & MINERAL PO) Take 1 tablet by mouth daily.     Red Yeast Rice 600 MG CAPS Take 600 mg by mouth daily.      Turmeric 500 MG TABS Take 500 mg by mouth daily.      venlafaxine XR (EFFEXOR XR) 150 MG 24 hr capsule Take 1 capsule (150 mg total) by mouth daily with breakfast. 90 capsule 1   vitamin B-12 (CYANOCOBALAMIN) 100 MCG tablet Take 100 mcg by mouth daily.     vitamin E 100 UNIT capsule Take 100 Units by mouth daily.     Current Facility-Administered Medications on File Prior to Visit  Medication Dose Route Frequency Provider Last Rate Last Admin   0.9 %  sodium chloride infusion  500 mL Intravenous Continuous Pyrtle, Lajuan Lines, MD       Allergies  Allergen Reactions   Pollen Extract Itching    sneezing   Social History   Socioeconomic History   Marital status: Married    Spouse name: Not on file   Number of children: Not on file   Years of education: Not on file   Highest education level: Not on file  Occupational History   Not on file  Tobacco Use   Smoking status: Never   Smokeless tobacco: Never  Vaping Use   Vaping Use: Never used  Substance and Sexual Activity   Alcohol use: No   Drug use: No   Sexual activity: Not on file  Other Topics Concern   Not on file  Social History Narrative   Not on file   Social Determinants of Health   Financial Resource Strain: Low Risk    Difficulty of Paying Living Expenses: Not hard at all  Food Insecurity: No Food Insecurity   Worried About  Estate manager/land agent of Food in the Last Year: Never true   Ran Out of Food in the Last Year: Never true  Transportation Needs: No Transportation Needs   Lack of Transportation (Medical): No   Lack of Transportation (Non-Medical): No  Physical Activity: Sufficiently Active   Days of Exercise per Week: 5 days   Minutes of Exercise per Session: 70 min  Stress: No Stress Concern Present   Feeling of Stress : Not at all  Social Connections: Moderately Integrated   Frequency of Communication with Friends and Family: More than three times a week   Frequency of Social Gatherings with Friends and Family: Three times a week   Attends Religious Services: More than 4 times per year   Active Member of Clubs or Organizations: No   Attends Archivist Meetings: Never   Marital Status: Married  Human resources officer Violence: Not At Risk   Fear of Current or Ex-Partner: No   Emotionally Abused: No   Physically Abused: No   Sexually Abused: No      Review of Systems  All other systems reviewed and are negative.     Objective:   Physical Exam Vitals reviewed.  Constitutional:      General: She is not in acute distress.    Appearance: She is well-developed. She is not diaphoretic.  HENT:     Head: Normocephalic and atraumatic.     Right Ear: External ear normal.     Left Ear: External ear normal.  Nose: Nose normal.     Mouth/Throat:     Pharynx: No oropharyngeal exudate.  Eyes:     Conjunctiva/sclera: Conjunctivae normal.     Pupils: Pupils are equal, round, and reactive to light.  Neck:     Thyroid: No thyromegaly.     Vascular: No JVD.     Trachea: No tracheal deviation.  Cardiovascular:     Rate and Rhythm: Normal rate and regular rhythm.     Heart sounds: Normal heart sounds. No murmur heard.   No friction rub. No gallop.  Pulmonary:     Effort: Pulmonary effort is normal. No respiratory distress.     Breath sounds: Normal breath sounds. No wheezing or rales.  Chest:      Chest wall: No tenderness.  Abdominal:     General: Bowel sounds are normal. There is no distension.     Palpations: Abdomen is soft. There is no mass.     Tenderness: There is no abdominal tenderness. There is no guarding or rebound.  Musculoskeletal:        General: No tenderness. Normal range of motion.     Cervical back: Neck supple.  Lymphadenopathy:     Cervical: No cervical adenopathy.  Skin:    Findings: No rash.  Neurological:     Mental Status: She is alert and oriented to person, place, and time.     Cranial Nerves: No cranial nerve deficit.     Motor: No abnormal muscle tone.     Coordination: Coordination normal.     Deep Tendon Reflexes: Reflexes normal.          Assessment & Plan:  Osteopenia, unspecified location Continue calcium 1200 mg a day along with vitamin D 1000 to 2000 units a day along with weightbearing exercise.  Recheck bone density in 2 years.  For the time being we have decided to stay away from hormone replacement therapy.  I will remove the cerumen impaction in her right ear with irrigation/lavage and treat swimmer's ear with Cortisporin HC otic 2 to 3 drops 4 times a day for 1 week

## 2020-11-05 DIAGNOSIS — H04123 Dry eye syndrome of bilateral lacrimal glands: Secondary | ICD-10-CM | POA: Diagnosis not present

## 2020-11-16 ENCOUNTER — Telehealth: Payer: Self-pay | Admitting: Pharmacist

## 2020-11-16 NOTE — Chronic Care Management (AMB) (Addendum)
    Chronic Care Management Pharmacy Assistant   Name: Taylor Taylor  MRN: [id number] DOB: 07/20/1945  Reason for Encounter: Medication Coordination Call    Recent office visits:  11/02/2020 [facility] (PCP) [name], MD; visit for ostepenia, continue calcium 1200 mg a day along with Vitamin D 1000 to 2000 units a day with weightbearing exercise  Recent consult visits:  10/15/2020 [facility] (orthopedics) Garald Balding, MD; voltaren gel for elbow pain  Hospital visits:  None in previous 6 months  Medications: Outpatient Encounter Medications as of 11/16/2020  Medication Sig   acetaminophen (TYLENOL) 500 MG tablet Take 500 mg by mouth 2 (two) times daily.   Calcium Carbonate-Vit D-Min (CALCIUM 1200 PO) Take 1 tablet by mouth in the morning and at bedtime.   cholecalciferol (VITAMIN D3) 25 MCG (1000 UNIT) tablet Take 125 mcg by mouth daily.   diclofenac (VOLTAREN) 75 MG EC tablet Take 1 tablet (75 mg total) by mouth 2 (two) times daily.   ferrous sulfate 325 (65 FE) MG tablet Take 325 mg by mouth daily with breakfast.   Fish Oil-Cholecalciferol (FISH OIL + D3) 1200-1000 MG-UNIT CAPS Take 1 tablet by mouth daily.   Glucosamine-Chondroit-Vit C-Mn (GLUCOSAMINE CHONDR 1500 COMPLX) CAPS Take 1 capsule by mouth 2 (two) times daily.   Melatonin 10 MG CAPS Take 10 mg by mouth at bedtime as needed (sleep).    Multiple Vitamins-Minerals (WOMENS MULTI VITAMIN & MINERAL PO) Take 1 tablet by mouth daily.   neomycin-polymyxin-hydrocortisone (CORTISPORIN) OTIC solution Place 3 drops into both ears 4 (four) times daily.   Red Yeast Rice 600 MG CAPS Take 600 mg by mouth daily.    Turmeric 500 MG TABS Take 500 mg by mouth daily.    venlafaxine XR (EFFEXOR XR) 150 MG 24 hr capsule Take 1 capsule (150 mg total) by mouth daily with breakfast.   vitamin B-12 (CYANOCOBALAMIN) 100 MCG tablet Take 100 mcg by mouth daily.   vitamin E 100 UNIT capsule Take 100 Units by mouth daily.   Facility-Administered  Encounter Medications as of 11/16/2020  Medication   0.9 %  sodium chloride infusion   Reviewed chart for medication changes ahead of medication coordination call.   BP Readings from Last 3 Encounters:  11/02/20 122/68  07/23/20 100/60  03/02/20 130/66    Lab Results  Component Value Date   HGBA1C 5.4 07/20/2020     Patient obtains medications through Vials  90 Days  Last adherence delivery included:  Venlafaxine ER 150 mg   Patient is due for next adherence delivery on: 11/25/2020. Called patient and reviewed medications and coordinated delivery.  This delivery to include: Venlafaxine ER 150 mg Diclofenac 75 mg   Patient declined the following medications: Myrbetriq - patient states she will call if she changes her mind.   Confirmed delivery date of 11/25/2020, advised patient that pharmacy will contact them the morning of delivery.  April D Calhoun, [place] Pharmacist Assistant [phone]   6 minutes spent in review, coordination, and documentation.  Reviewed by: Beverly Milch, PharmD Clinical Pharmacist [phone]

## 2020-11-24 ENCOUNTER — Ambulatory Visit: Payer: PPO | Admitting: Orthopaedic Surgery

## 2020-11-25 DIAGNOSIS — H2512 Age-related nuclear cataract, left eye: Secondary | ICD-10-CM | POA: Diagnosis not present

## 2020-11-25 DIAGNOSIS — H25812 Combined forms of age-related cataract, left eye: Secondary | ICD-10-CM | POA: Diagnosis not present

## 2020-12-15 ENCOUNTER — Encounter: Payer: Self-pay | Admitting: Orthopaedic Surgery

## 2020-12-15 ENCOUNTER — Other Ambulatory Visit: Payer: Self-pay

## 2020-12-15 ENCOUNTER — Ambulatory Visit: Payer: PPO | Admitting: Orthopaedic Surgery

## 2020-12-15 DIAGNOSIS — M25551 Pain in right hip: Secondary | ICD-10-CM

## 2020-12-15 MED ORDER — LIDOCAINE HCL 1 % IJ SOLN
2.0000 mL | INTRAMUSCULAR | Status: AC | PRN
  Administered 2020-12-15: 2 mL

## 2020-12-15 MED ORDER — BUPIVACAINE HCL 0.25 % IJ SOLN
2.0000 mL | INTRAMUSCULAR | Status: AC | PRN
Start: 2020-12-15 — End: 2020-12-15
  Administered 2020-12-15: 2 mL via INTRA_ARTICULAR

## 2020-12-15 MED ORDER — METHYLPREDNISOLONE ACETATE 40 MG/ML IJ SUSP
80.0000 mg | INTRAMUSCULAR | Status: AC | PRN
  Administered 2020-12-15: 80 mg via INTRA_ARTICULAR

## 2020-12-15 NOTE — Progress Notes (Signed)
Office Visit Note   [name]           Date of Birth: [birth year]           MRN: [id number] Visit Date: 12/15/2020              Requested by: [name], MD [address] [facility] [place],  [place] [address] PCP: [name], MD   Assessment & Plan: Visit Diagnoses:  1. Pain in right hip     Plan: Recurrent pain below the greater trochanter lateral proximal right thigh.  Has had successful cortisone injection in the same location in the past.  Will reinject today.  No pain over the trochanter or the knee.  No masses  Follow-Up Instructions: Return in about 2 weeks (around 12/29/2020).   Orders:  No orders of the defined types were placed in this encounter.  No orders of the defined types were placed in this encounter.     Procedures: Large Joint Inj: R greater trochanter on 12/15/2020 4:02 PM Indications: pain and diagnostic evaluation Details: 25 G 1.5 in needle  Arthrogram: No  Medications: 2 mL lidocaine 1 %; 80 mg methylPREDNISolone acetate 40 MG/ML; 2 mL bupivacaine 0.25 %  Injected area of tenderness along the IT band below the greater trochanter right thigh Procedure, treatment alternatives, risks and benefits explained, specific risks discussed. Consent was given by the patient. Immediately prior to procedure a time out was called to verify the correct patient, procedure, equipment, support staff and site/side marked as required. Patient was prepped and draped in the usual sterile fashion.      Clinical Data: No additional findings.   Subjective: Chief Complaint  Patient presents with   Right Hip - Pain  Patient presents today for right thigh pain. She received an injection at the end of July and states that it helped. She has been hurting again for a month. She wants to get another injection today.   HPI  Review of Systems   Objective: Vital Signs: There were no vitals taken for this visit.  Physical  Exam Constitutional:      Appearance: She is well-developed.  Eyes:     Pupils: Pupils are equal, round, and reactive to light.  Pulmonary:     Effort: Pulmonary effort is normal.  Skin:    General: Skin is warm and dry.  Neurological:     Mental Status: She is alert and oriented to person, place, and time.  Psychiatric:        Behavior: Behavior normal.    Ortho Exam localized tenderness below the greater trochanter in the lateral right thigh.  Localizes to the IT band.  No masses.  No crepitation.  No defects.  Specialty Comments:  No specialty comments available.  Imaging: No results found.   PMFS History: Patient Active Problem List   Diagnosis Date Noted   Pain in right elbow 10/15/2020   Bilateral primary osteoarthritis of knee 09/26/2019   Pain in right hip 04/30/2019   Neck pain on right side 12/25/2018   Pain in right shoulder 12/11/2018   Pain in right hand 05/18/2018   Pain in left hand 05/18/2018   Unilateral primary osteoarthritis, right knee 08/24/2016   Hypertriglyceridemia without hypercholesterolemia 09/03/2013   CHEST PAIN-PRECORDIAL 08/31/2009   CHEST PAIN, ATYPICAL 08/28/2009   Past Medical History:  Diagnosis Date   Allergy    Seasonal   Arthritis    back   GERD (gastroesophageal  reflux disease)    in past    Family History  Problem Relation Age of Onset   Parkinson's disease Mother    Brain cancer Father    Breast cancer Neg Hx     Past Surgical History:  Procedure Laterality Date   CERVICAL DISC SURGERY     over 10 years   COLONOSCOPY     Social History   Occupational History   Not on file  Tobacco Use   Smoking status: Never   Smokeless tobacco: Never  Vaping Use   Vaping Use: Never used  Substance and Sexual Activity   Alcohol use: No   Drug use: No   Sexual activity: Not on file

## 2020-12-18 DIAGNOSIS — L821 Other seborrheic keratosis: Secondary | ICD-10-CM | POA: Diagnosis not present

## 2020-12-18 DIAGNOSIS — D485 Neoplasm of uncertain behavior of skin: Secondary | ICD-10-CM | POA: Diagnosis not present

## 2020-12-18 DIAGNOSIS — L298 Other pruritus: Secondary | ICD-10-CM | POA: Diagnosis not present

## 2020-12-28 ENCOUNTER — Encounter: Payer: Self-pay | Admitting: Family Medicine

## 2020-12-28 ENCOUNTER — Other Ambulatory Visit: Payer: Self-pay | Admitting: Family Medicine

## 2020-12-28 DIAGNOSIS — Z01419 Encounter for gynecological examination (general) (routine) without abnormal findings: Secondary | ICD-10-CM | POA: Diagnosis not present

## 2020-12-28 MED ORDER — ALENDRONATE SODIUM 70 MG PO TABS: 70.0000 mg | ORAL_TABLET | ORAL | 3 refills | Status: DC

## 2020-12-29 ENCOUNTER — Ambulatory Visit: Payer: PPO | Admitting: Orthopaedic Surgery

## 2021-01-04 ENCOUNTER — Telehealth: Payer: PPO

## 2021-01-27 ENCOUNTER — Other Ambulatory Visit: Payer: Self-pay | Admitting: Physician Assistant

## 2021-01-27 ENCOUNTER — Other Ambulatory Visit: Payer: Self-pay

## 2021-01-27 ENCOUNTER — Encounter: Payer: Self-pay | Admitting: Orthopaedic Surgery

## 2021-01-27 ENCOUNTER — Ambulatory Visit: Payer: PPO | Admitting: Orthopaedic Surgery

## 2021-01-27 ENCOUNTER — Ambulatory Visit: Payer: Self-pay

## 2021-01-27 DIAGNOSIS — M545 Low back pain, unspecified: Secondary | ICD-10-CM | POA: Diagnosis not present

## 2021-01-27 MED ORDER — METHYLPREDNISOLONE 4 MG PO TBPK: ORAL_TABLET | ORAL | 0 refills | Status: DC

## 2021-01-27 NOTE — Progress Notes (Signed)
Office Visit Note   [name]           Date of Birth: 1945-10-14           MRN: [id number] Visit Date: 01/27/2021              Requested by: [name], MD [address] [facility] [place],  [place] [address] PCP: [name], MD   Assessment & Plan: Visit Diagnoses:  1. Acute bilateral low back pain, unspecified whether sciatica present     Plan: Patient is a pleasant 75 year old active woman who comes in with a 3 to 4-week history of lower back pain with sciatica radiating down her right leg to her calf.  She describes the pain in her leg as throbbing.  She denies any injury.  She does workout in the gym on a regular basis but has done this for many years.  She notices the pain most at night.  She cannot lie on either side but if she lays on her back this seems to improve it.  She also notices the pain after she has been sitting for a while and attempts to get up.  She denies any numbness or tingling.  She denies any loss of bowel or bladder control.  Her strength is actually quite good and symmetrical bilaterally.  Previous MRI reviewed from 2019 does show some disc bulging to the right impinging on the L5 nerve root.  In addition she has degenerative changes throughout her spine from L2-3 to L5-S1.  At the time of the MRI in 2019 she was placed on a Medrol Dosepak at the same time the MRI was ordered.  She did not follow-up after the MRI and hopefully the Dosepak was helpful in decreasing the inflammation.  We will go forward and order another Dosepak for her today.  She also takes Voltaren orally once a day in the morning.  We have asked that she hold on taking this while she is on the steroid and to be sure to take the steroid with food.  When she is finished with the steroid she can try taking Voltaren in the evening since most of her symptoms seem to be at night.  She denies any groin pain or hip pain.  Follow-Up Instructions: No follow-ups on file.   Orders:   Orders Placed This Encounter  Procedures   XR Lumbar Spine 2-3 Views   No orders of the defined types were placed in this encounter.     Procedures: No procedures performed   Clinical Data: No additional findings.   Subjective: No chief complaint on file. Patient presents today for lower back pain. She said that it started to hurt 3-4weeks ago. No known injury. She said that her pain travels down both legs, but worse in the right leg. She feels like the right leg is weaker. She takes over the counter medicine. Her pain seems worse when she lays down at night.     Review of Systems  All other systems reviewed and are negative.   Objective: Vital Signs: There were no vitals taken for this visit.  Physical Exam Constitutional:      Appearance: Normal appearance.  Eyes:     Extraocular Movements: Extraocular movements intact.  Pulmonary:     Effort: Pulmonary effort is normal.     Breath sounds: Normal breath sounds.  Neurological:     General: No focal deficit present.     Mental Status:  She is alert.  Psychiatric:        Mood and Affect: Mood normal.    Ortho Exam Examination of her lower back.  She has no tenderness to palpation throughout the spine.  She has pain reproduced in her lower back and into her right buttock with forward flexion and some with extension.  Side to side bending does not seem to really affect it much.  Her strength is symmetric bilaterally and 5 out of 5.  Deep tendon reflexes are symmetric.  She has a negative straight leg raise on the left on the right side straight leg raise reproduces some throbbing in her calf. Specialty Comments:  No specialty comments available.  Imaging: No results found.   PMFS History: Patient Active Problem List   Diagnosis Date Noted   Pain in right elbow 10/15/2020   Bilateral primary osteoarthritis of knee 09/26/2019   Pain in right hip 04/30/2019   Neck pain on right side 12/25/2018   Pain in right  shoulder 12/11/2018   Pain in right hand 05/18/2018   Pain in left hand 05/18/2018   Unilateral primary osteoarthritis, right knee 08/24/2016   Hypertriglyceridemia without hypercholesterolemia 09/03/2013   CHEST PAIN-PRECORDIAL 08/31/2009   CHEST PAIN, ATYPICAL 08/28/2009   Past Medical History:  Diagnosis Date   Allergy    Seasonal   Arthritis    back   GERD (gastroesophageal reflux disease)    in past    Family History  Problem Relation Age of Onset   Parkinson's disease Mother    Brain cancer Father    Breast cancer Neg Hx     Past Surgical History:  Procedure Laterality Date   CERVICAL DISC SURGERY     over 10 years   COLONOSCOPY     Social History   Occupational History   Not on file  Tobacco Use   Smoking status: Never   Smokeless tobacco: Never  Vaping Use   Vaping Use: Never used  Substance and Sexual Activity   Alcohol use: No   Drug use: No   Sexual activity: Not on file

## 2021-02-03 ENCOUNTER — Other Ambulatory Visit: Payer: Self-pay

## 2021-02-03 ENCOUNTER — Encounter: Payer: Self-pay | Admitting: Orthopaedic Surgery

## 2021-02-03 DIAGNOSIS — M545 Low back pain, unspecified: Secondary | ICD-10-CM

## 2021-02-16 ENCOUNTER — Telehealth: Payer: Self-pay | Admitting: Pharmacist

## 2021-02-16 ENCOUNTER — Other Ambulatory Visit: Payer: Self-pay

## 2021-02-16 MED ORDER — VENLAFAXINE HCL ER 150 MG PO CP24: 150.0000 mg | ORAL_CAPSULE | Freq: Every day | ORAL | 2 refills | Status: DC

## 2021-02-16 MED ORDER — DICLOFENAC SODIUM 75 MG PO TBEC: 75.0000 mg | DELAYED_RELEASE_TABLET | Freq: Two times a day (BID) | ORAL | 2 refills | Status: DC

## 2021-02-16 NOTE — Chronic Care Management (AMB) (Addendum)
Chronic Care Management Pharmacy Assistant   Name: Taylor Taylor  MRN: [id number] DOB: June 13, 1945   Reason for Encounter: Medication Coordination Call   Recent office visits:  None  Recent consult visits:  01/27/2021 OV (orthopedics) Garald Balding, MD; We will go forward and order another Dosepak for her today.  She also takes Voltaren orally once a day in the morning.  We have asked that she hold on taking this while she is on the steroid and to be sure to take the steroid with food.  When she is finished with the steroid she can try taking Voltaren in the evening since most of her symptoms seem to be at night.   12/15/2020 OV (orthopedics) Garald Balding, MD; no medication changes indicated.  Hospital visits:  None in previous 6 months  Medications: Outpatient Encounter Medications as of 02/16/2021  Medication Sig   acetaminophen (TYLENOL) 500 MG tablet Take 500 mg by mouth 2 (two) times daily.   alendronate (FOSAMAX) 70 MG tablet Take 1 tablet (70 mg total) by mouth every 7 (seven) days. Take with a full glass of water on an empty stomach.   Calcium Carbonate-Vit D-Min (CALCIUM 1200 PO) Take 1 tablet by mouth in the morning and at bedtime.   cholecalciferol (VITAMIN D3) 25 MCG (1000 UNIT) tablet Take 125 mcg by mouth daily.   diclofenac (VOLTAREN) 75 MG EC tablet Take 1 tablet (75 mg total) by mouth 2 (two) times daily.   ferrous sulfate 325 (65 FE) MG tablet Take 325 mg by mouth daily with breakfast.   Fish Oil-Cholecalciferol (FISH OIL + D3) 1200-1000 MG-UNIT CAPS Take 1 tablet by mouth daily.   Glucosamine-Chondroit-Vit C-Mn (GLUCOSAMINE CHONDR 1500 COMPLX) CAPS Take 1 capsule by mouth 2 (two) times daily.   Melatonin 10 MG CAPS Take 10 mg by mouth at bedtime as needed (sleep).    methylPREDNISolone (MEDROL DOSEPAK) 4 MG TBPK tablet Take as directed on package.   Multiple Vitamins-Minerals (WOMENS MULTI VITAMIN & MINERAL PO) Take 1 tablet by mouth daily.    Red Yeast Rice 600 MG CAPS Take 600 mg by mouth daily.    Turmeric 500 MG TABS Take 500 mg by mouth daily.    venlafaxine XR (EFFEXOR XR) 150 MG 24 hr capsule Take 1 capsule (150 mg total) by mouth daily with breakfast.   vitamin B-12 (CYANOCOBALAMIN) 100 MCG tablet Take 100 mcg by mouth daily.   vitamin E 100 UNIT capsule Take 100 Units by mouth daily.   Facility-Administered Encounter Medications as of 02/16/2021  Medication   0.9 %  sodium chloride infusion    Reviewed chart for medication changes ahead of medication coordination call.  No OVs, Consults, or hospital visits since last care coordination call/Pharmacist visit. (If appropriate, list visit date, provider name)  No medication changes indicated OR if recent visit, treatment plan here.  BP Readings from Last 3 Encounters:  11/02/20 122/68  07/23/20 100/60  03/02/20 130/66    Lab Results  Component Value Date   HGBA1C 5.4 07/20/2020     Patient obtains medications through Vials  90 Days    Patient is due for next adherence delivery on: 02/24/2021. Called patient and reviewed medications and coordinated delivery.  This delivery to include: Alendronate 70 mg once a week Diclofenac Sodium 75 mg twice a day Venlafaxine ER 150 mg once a day  Patient needs refills for: Diclofenac Sodium 75 mg Venlafaxine ER 150 mg -Rx refill request sent to [facility]  Rx refill pool.  Confirmed delivery date of 02/24/2021, advised patient that pharmacy will contact them the morning of delivery.   April D Calhoun, [place] Pharmacist Assistant [phone]   10 minutes spent in review, coordination, and documentation.  Reviewed by: Beverly Milch, PharmD Clinical Pharmacist [phone]

## 2021-02-17 ENCOUNTER — Telehealth: Payer: Self-pay | Admitting: Physical Medicine and Rehabilitation

## 2021-02-17 NOTE — Telephone Encounter (Signed)
Tried calling to discuss as [facility] is out of the office for the next several days. No answer.

## 2021-02-17 NOTE — Telephone Encounter (Signed)
Pt returned call to [employer]. Told pt to listen out for phone. Please call pt at [phone].

## 2021-02-17 NOTE — Telephone Encounter (Signed)
Pt called requesting a call back from [facility]. Pt did not disclose reasoning for call. Pt phone number is [phone].

## 2021-02-18 ENCOUNTER — Telehealth: Payer: PPO

## 2021-02-18 NOTE — Telephone Encounter (Signed)
Patient asking to go ahead and be put on the schedule for an injection, she has appt Monday and I told her that we can discuss this Monday because [facility] is not here today to schedule these

## 2021-02-18 NOTE — Progress Notes (Deleted)
Chronic Care Management Pharmacy Note  02/18/2021 Name:  Taylor Taylor MRN:  [id number] DOB:  11/25/1945  Subjective: Taylor Taylor is an 75 y.o. year old female who is a primary patient of Pickard, Cammie Mcgee, MD.  The CCM team was consulted for assistance with disease management and care coordination needs.    Engaged with patient by telephone for follow up visit in response to provider referral for pharmacy case management and/or care coordination services.   Consent to Services:  The patient was given the following information about Chronic Care Management services today, agreed to services, and gave verbal consent: 1. CCM service includes personalized support from designated clinical staff supervised by the primary care provider, including individualized plan of care and coordination with other care providers 2. 24/7 contact phone numbers for assistance for urgent and routine care needs. 3. Service will only be billed when office clinical staff spend 20 minutes or more in a month to coordinate care. 4. Only one practitioner may furnish and bill the service in a calendar month. 5.The patient may stop CCM services at any time (effective at the end of the month) by phone call to the office staff. 6. The patient will be responsible for cost sharing (co-pay) of up to 20% of the service fee (after annual deductible is met). Patient agreed to services and consent obtained.  Patient Care Team: [name], MD as PCP - General (Family Medicine) Edythe Clarity, [facility] as Pharmacist (Pharmacist)  Recent office visits:  None   Recent consult visits:  01/27/2021 OV (orthopedics) Garald Balding, MD; We will go forward and order another Dosepak for her today.  She also takes Voltaren orally once a day in the morning.  We have asked that she hold on taking this while she is on the steroid and to be sure to take the steroid with food.  When she is finished with the steroid she can  try taking Voltaren in the evening since most of her symptoms seem to be at night.    12/15/2020 OV (orthopedics) Garald Balding, MD; no medication changes indicated.   Hospital visits:  None in previous 6 months  Objective:  Lab Results  Component Value Date   CREATININE 0.81 07/20/2020   BUN 25 07/20/2020   GFRNONAA 72 07/20/2020   GFRAA 83 07/20/2020   NA 140 07/20/2020   K 4.2 07/20/2020   CALCIUM 9.4 07/20/2020   CO2 27 07/20/2020   GLUCOSE 80 07/20/2020    Lab Results  Component Value Date/Time   HGBA1C 5.4 07/20/2020 08:21 AM    Last diabetic Eye exam: No results found for: HMDIABEYEEXA  Last diabetic Foot exam: No results found for: HMDIABFOOTEX   Lab Results  Component Value Date   CHOL 193 07/20/2020   HDL 84 07/20/2020   LDLCALC 94 07/20/2020   TRIG 60 07/20/2020   CHOLHDL 2.3 07/20/2020    Hepatic Function Latest Ref Rng & Units 07/20/2020 03/02/2020 07/04/2019  Total Protein 6.1 - 8.1 g/dL 6.5 6.8 6.6  Albumin 3.6 - 5.1 g/dL - - -  AST 10 - 35 U/L 13 15 18   ALT 6 - 29 U/L 14 10 14   Alk Phosphatase 33 - 130 U/L - - -  Total Bilirubin 0.2 - 1.2 mg/dL 0.5 0.3 0.5    Lab Results  Component Value Date/Time   TSH 1.86 03/02/2020 03:50 PM   TSH 1.38 11/05/2018 04:33 PM    CBC Latest Ref Rng &  Units 03/02/2020 07/04/2019 12/24/2018  WBC 3.8 - 10.8 Thousand/uL 7.3 8.1 5.5  Hemoglobin 11.7 - 15.5 g/dL 13.0 13.5 13.8  Hematocrit 35.0 - 45.0 % 38.4 41.4 41.5  Platelets 140 - 400 Thousand/uL 301 296 287    Lab Results  Component Value Date/Time   VD25OH 63 11/12/2015 09:02 AM    Clinical ASCVD: No  The 10-year ASCVD risk score (Arnett DK, et al., 2019) is: 11.2%   Values used to calculate the score:     Age: 15 years     Sex: Female     Is Non-Hispanic African American: No     Diabetic: No     Tobacco smoker: No     Systolic Blood Pressure: 817 mmHg     Is BP treated: No     HDL Cholesterol: 84 mg/dL     Total Cholesterol: 193 mg/dL     Depression screen [facility] 2/9 07/23/2020 07/15/2019 02/21/2018  Decreased Interest 0 0 0  Down, Depressed, Hopeless 0 0 0  PHQ - 2 Score 0 0 0  Altered sleeping - - 3  Tired, decreased energy - - 0  Change in appetite - - 0  Feeling bad or failure about yourself  - - 0  Trouble concentrating - - 1  Moving slowly or fidgety/restless - - 0  Suicidal thoughts - - 0  PHQ-9 Score - - 4  Difficult doing work/chores - - Somewhat difficult      Social History   Tobacco Use  Smoking Status Never  Smokeless Tobacco Never   BP Readings from Last 3 Encounters:  11/02/20 122/68  07/23/20 100/60  03/02/20 130/66   Pulse Readings from Last 3 Encounters:  11/02/20 74  07/23/20 76  03/02/20 67   Wt Readings from Last 3 Encounters:  11/02/20 122 lb (55.3 kg)  10/15/20 126 lb (57.2 kg)  07/23/20 126 lb 8 oz (57.4 kg)   BMI Readings from Last 3 Encounters:  11/02/20 21.61 kg/m  10/15/20 22.32 kg/m  07/23/20 22.41 kg/m    Assessment/Interventions: Review of patient past medical history, allergies, medications, health status, including review of consultants reports, laboratory and other test data, was performed as part of comprehensive evaluation and provision of chronic care management services.   SDOH:  (Social Determinants of Health) assessments and interventions performed: Yes   Financial Resource Strain: Low Risk    Difficulty of Paying Living Expenses: Not hard at all    SDOH Screenings   Alcohol Screen: Low Risk    Last Alcohol Screening Score (AUDIT): 0  Depression (PHQ2-9): Low Risk    PHQ-2 Score: 0  Financial Resource Strain: Low Risk    Difficulty of Paying Living Expenses: Not hard at all  Food Insecurity: No Food Insecurity   Worried About Charity fundraiser in the Last Year: Never true   Ran Out of Food in the Last Year: Never true  Housing: Low Risk    Last Housing Risk Score: 0  Physical Activity: Sufficiently Active   Days of Exercise per Week: 5 days    Minutes of Exercise per Session: 70 min  Social Connections: Moderately Integrated   Frequency of Communication with Friends and Family: More than three times a week   Frequency of Social Gatherings with Friends and Family: Three times a week   Attends Religious Services: More than 4 times per year   Active Member of Clubs or Organizations: No   Attends Archivist Meetings: Never  Marital Status: Married  Stress: No Stress Concern Present   Feeling of Stress : Not at all  Tobacco Use: Low Risk    Smoking Tobacco Use: Never   Smokeless Tobacco Use: Never   Passive Exposure: Not on file  Transportation Needs: No Transportation Needs   Lack of Transportation (Medical): No   Lack of Transportation (Non-Medical): No    CCM Care Plan  Allergies  Allergen Reactions   Pollen Extract Itching    sneezing    Medications Reviewed Today     Reviewed by Persons, Bevely Palmer, PA (Physician Assistant) on 01/27/21 at 1442  Med List Status: <None>   Medication Order Taking? Sig Documenting Provider Last Dose Status Informant  0.9 %  sodium chloride infusion [id number]   Pyrtle, Lajuan Lines, MD  Active   acetaminophen (TYLENOL) 500 MG tablet [id number] Yes Take 500 mg by mouth 2 (two) times daily. [provider] Taking Active Self  alendronate (FOSAMAX) 70 MG tablet [id number] Yes Take 1 tablet (70 mg total) by mouth every 7 (seven) days. Take with a full glass of water on an empty stomach. [name], MD Taking Active   Calcium Carbonate-Vit D-Min (CALCIUM 1200 PO) [id number] Yes Take 1 tablet by mouth in the morning and at bedtime. [provider] Taking Active   cholecalciferol (VITAMIN D3) 25 MCG (1000 UNIT) tablet [id number] Yes Take 125 mcg by mouth daily. [provider] Taking Active   diclofenac (VOLTAREN) 75 MG EC tablet [id number] Yes Take 1 tablet (75 mg total) by mouth 2 (two) times daily. [name], MD Taking Active   ferrous sulfate 325 (65  FE) MG tablet [id number] Yes Take 325 mg by mouth daily with breakfast. [provider] Taking Active Self  Fish Oil-Cholecalciferol (FISH OIL + D3) 1200-1000 MG-UNIT CAPS [id number] Yes Take 1 tablet by mouth daily. [provider] Taking Active Self  Glucosamine-Chondroit-Vit C-Mn (GLUCOSAMINE CHONDR 1500 COMPLX) CAPS [id number] Yes Take 1 capsule by mouth 2 (two) times daily. [provider] Taking Active Self  Melatonin 10 MG CAPS [id number] Yes Take 10 mg by mouth at bedtime as needed (sleep).  [provider] Taking Active Self  methylPREDNISolone (MEDROL DOSEPAK) 4 MG TBPK tablet [id number] Yes Take as directed on package. Garald Balding, MD  Active   Multiple Vitamins-Minerals (WOMENS MULTI VITAMIN & MINERAL PO) [id number] Yes Take 1 tablet by mouth daily. [provider] Taking Active Self  Red Yeast Rice 600 MG CAPS [id number] Yes Take 600 mg by mouth daily.  [provider] Taking Active Self  Turmeric 500 MG TABS [id number] Yes Take 500 mg by mouth daily.  [provider] Taking Active Self  venlafaxine XR (EFFEXOR XR) 150 MG 24 hr capsule [id number] Yes Take 1 capsule (150 mg total) by mouth daily with breakfast. [name], MD Taking Active   vitamin B-12 (CYANOCOBALAMIN) 100 MCG tablet [id number] Yes Take 100 mcg by mouth daily. [provider] Taking Active Self  vitamin E 100 UNIT capsule [id number] Yes Take 100 Units by mouth daily. [provider] Taking Active Self            Patient Active Problem List   Diagnosis Date Noted   Pain in right elbow 10/15/2020   Bilateral primary osteoarthritis of knee 09/26/2019   Pain in right hip 04/30/2019   Neck pain on right side 12/25/2018   Pain in right shoulder 12/11/2018   Pain in right hand 05/18/2018   Pain  in left hand 05/18/2018   Unilateral primary osteoarthritis, right knee 08/24/2016   Hypertriglyceridemia without hypercholesterolemia  09/03/2013   CHEST PAIN-PRECORDIAL 08/31/2009   CHEST PAIN, ATYPICAL 08/28/2009    Immunization History  Administered Date(s) Administered   Fluad Quad(high Dose 65+) 12/06/2018   Hepatitis A 12/10/1997, 09/17/1998   Hepatitis B 12/10/1997, 01/09/1998, 08/11/1998   Hepatitis B, ped/adol 12/10/1997, 01/09/1998, 08/11/1998   Influenza Inj Mdck Quad With Preservative 11/19/2016, 11/20/2019   Influenza Split 01/18/2012   Influenza, High Dose Seasonal PF 12/22/2015, 10/27/2016, 12/15/2017, 12/06/2018   Influenza,inj,Quad PF,6+ Mos 01/01/2013, 01/07/2014, 12/30/2014   Influenza-Unspecified 12/20/2014, 10/27/2016, 12/23/2020   PFIZER(Purple Top)SARS-COV-2 Vaccination 04/12/2019, 05/02/2019, 07/20/2020   Pfizer Covid-19 Vaccine Bivalent Booster 83yr & up 12/23/2020   Pneumococcal Conjugate-13 07/17/2014   Pneumococcal Polysaccharide-23 07/12/2011   Td 12/09/1997   Tdap 07/12/2011   Zoster Recombinat (Shingrix) 08/12/2016, 12/24/2016   Zoster, Live 02/26/2010    Conditions to be addressed/monitored:  Osteoarthritis, Hypertriglyceridemia  There are no care plans that you recently modified to display for this patient.    Medication Assistance: None required.  Patient affirms current coverage meets needs.  Patient's preferred pharmacy is:  [facility] [place] [address] [address] [place]Phone: [phone]: [phone] [id number] [place]: [phone]: [phone] pill box? No - synchronization through [facility] Pt endorses 100% compliance  We discussed: Benefits of medication synchronization, packaging and delivery as well as enhanced pharmacist oversight with [facility]. Patient decided to: Utilize [facility] for medication synchronization, packaging and delivery  Care Plan and Follow Up  Patient Decision:  Patient agrees to Care Plan and Follow-up.  Plan: The care management team will reach out to the patient again over the next 180 days.  ChBeverly MilchPharmD Clinical Pharmacist [facility] ([phone]  Current Barriers:  No specific barriers identified at this time  Pharmacist Clinical Goal(s):  Patient will verbalize ability to afford treatment regimen achieve adherence to monitoring guidelines and medication adherence to achieve therapeutic efficacy contact provider office for questions/concerns as evidenced notation of same in electronic health record through collaboration with PharmD and provider.   Interventions: 1:1 collaboration with PiSusy FrizzleMD regarding development and update of comprehensive plan of care as evidenced by provider attestation and co-signature Inter-disciplinary care team collaboration (see longitudinal plan of care) Comprehensive medication review performed; medication list updated in electronic medical record  Hyperlipidemia: (LDL goal < 100, TG < 150 -Controlled -Current treatment: Red Yeast Rice Fish Oil 100058maily -Medications previously tried: none noted   -Educated on Cholesterol goals;  Importance of limiting foods high in cholesterol; Exercise goal of 150 minutes per week; -Recommended continue current management strategy.  Patient would benefit from updated lipid panel  Osteoarthritis (Goal: Minimize symptoms) -Controlled -Current treatment  Voltaren 64m90m daily Tylenol 500mg37mdications previously tried: none noted  -Pain is controlled with Voltaren -Counseled on avoidance of other NSAIDs with Voltaren treatment, patient aware. Recommended continue current medications  Osteopenia (Goal Prevent fractures) -Controlled -Last DEXA Scan: 10/28/20   T-Score femoral neck: -1.1  T-Score forearm radius: -0.4 -Patient is a candidate for pharmacologic treatment due to previous therapy  with HRT. -Current treatment  Alendronate 70mg 69m weekly Calcium 1200mg d53m + Vitamin D 1000units daily -Medications previously tried: ***  -{Osteoporosis Counseling:23892} -{CCMPHARMDINTERVENTION:25122}  Patient Goals/Self-Care Activities Patient will:  - take medications as prescribed focus on medication adherence by pill packs Continue to work to bring total cholesterol to goal.  Follow Up Plan: The care management team will reach out to the patient again over the next 180 days.

## 2021-02-18 NOTE — Telephone Encounter (Signed)
IC

## 2021-02-22 ENCOUNTER — Ambulatory Visit: Payer: PPO | Admitting: Physical Medicine and Rehabilitation

## 2021-02-22 ENCOUNTER — Other Ambulatory Visit: Payer: Self-pay

## 2021-02-22 ENCOUNTER — Encounter: Payer: Self-pay | Admitting: Physical Medicine and Rehabilitation

## 2021-02-22 VITALS — BP 118/63 | HR 72 | Ht 63.0 in | Wt 124.0 lb

## 2021-02-22 DIAGNOSIS — M5116 Intervertebral disc disorders with radiculopathy, lumbar region: Secondary | ICD-10-CM | POA: Diagnosis not present

## 2021-02-22 DIAGNOSIS — M47816 Spondylosis without myelopathy or radiculopathy, lumbar region: Secondary | ICD-10-CM | POA: Diagnosis not present

## 2021-02-22 DIAGNOSIS — M79604 Pain in right leg: Secondary | ICD-10-CM | POA: Diagnosis not present

## 2021-02-22 NOTE — Progress Notes (Signed)
Sarai January - 75 y.o. female MRN [id number]  Date of birth: 04-06-1945  Office Visit Note: Visit Date: 02/22/2021 PCP: [name], MD Referred by: [name], MD  Subjective: Chief Complaint  Patient presents with   Lower Back - Pain   HPI: Lerae Langham is a 75 y.o. female who comes in today per the request of Dr. Joni Fears for evaluation of right sided leg pain. Patient reports pain has been ongoing for several years and was seen in our office in 2018 where she received right L5 transforaminal epidural steroid injection that gave her significant and sustained pain relief until recently. Patient reports pain is exacerbated by sleeping on right side, describes as a sore and throbbing sensation, currently rates as 8 out of 10. Patient reports some relief of pain with lying flat on her back, home exercise regimen and use of topical pain creams. Patient states she did attend formal physical therapy many years ago and reports these treatments did help to alleviate pain. Patient's lumbar MRI from 2019 exhibits multi-level facet hypertrophy, right subarticular disc protrusion at L4-5 with right lateral recess stenosis and potential L5 nerve root impingement, no high grade spinal canal stenosis noted. Patient reports she has been getting right iliotibial band injections by Dr. Joni Fears which does help her right lateral thigh pain, however she now reports her entire right leg is sore and throbbing. Historically patient has done well in the past with intermittent right L5 transforaminal epidural steroid injections and she reports her pain is unchanged from previous years and would like to try repeating injection. Patient states she is a very active person, goes to gym 5 days a week and walks on treadmill and performs strength training. Patient reports pain is becoming more severe recently and is making it difficult for her to complete work outs. Patient denies focal  weakness, numbness and tingling. Patient denies recent trauma or falls.   Review of Systems  Musculoskeletal:        Left leg throbbing pain  Neurological:  Negative for tingling, sensory change, focal weakness and weakness.  All other systems reviewed and are negative. Otherwise per HPI.  Assessment & Plan: Visit Diagnoses:    ICD-10-CM   1. Pain in right leg  M79.604 Ambulatory referral to Physical Medicine Rehab    2. Intervertebral disc disorders with radiculopathy, lumbar region  M51.16 Ambulatory referral to Physical Medicine Rehab    3. Facet hypertrophy of lumbar region  M47.816        Plan: Findings:  Chronic, worsening and severe right sided leg pain. At this time she does not complain of back discomfort, however right leg issues have worsened over the last several months. Patient continues to have pain despite good conservative therapies such as home exercise program, formal physical therapy, and use of topical pain medications. Patient's clinical presentation and exam are consistent with L5 nerve pattern. We believe the next step is to repeat right L5 transforaminal epidural injection under fluoroscopic guidance. Historically, patient has done well with intermittent epidural steroid injections. Patient encouraged to remain active and to continue home exercise regimen as tolerated. We feel that we can get patient in quickly for her injection. No red flag symptoms noted upon exam today.  Depending on relief patient would likely do well with decompression and microdiscectomy at that level.   Meds & Orders: No orders of the defined types were placed in this encounter.   Orders Placed This Encounter  Procedures   Ambulatory referral to Physical Medicine Rehab     Follow-up: Return for Right L5 transforaminal epidural steroid injection.   Procedures: No procedures performed      Clinical History: MRI LUMBAR SPINE WITHOUT CONTRAST   TECHNIQUE: Multiplanar, multisequence MR  imaging of the lumbar spine was performed. No intravenous contrast was administered.   COMPARISON:  12/23/2008   FINDINGS: Segmentation:  Standard.   Alignment: New trace anterolisthesis of T12 on L1. Unchanged trace retrolisthesis of L5 on S1.   Vertebrae: No fracture. Minimal degenerative endplate edema at E9-H3. Unchanged 6 mm STIR hyperintense focus in the L3 vertebral body, likely a benign hemangioma.   Conus medullaris and cauda equina: Conus extends to the L1 level. Conus and cauda equina appear normal.   Paraspinal and other soft tissues: Unremarkable.   Disc levels:   Disc desiccation from L2-3 to L5-S1. Chronic severe disc space narrowing at L5-S1.   T12-L1: New severe disc space narrowing. Mild disc bulging and facet arthrosis without stenosis.   L1-2: Negative.   L2-3: Mild disc bulging, small left foraminal disc protrusion, and mild facet and ligamentum flavum hypertrophy. Disc degeneration has slightly progressed, overall mildly progressed from prior but without significant stenosis. Small facet joint effusions.   L3-4: Mild disc bulging and mild-to-moderate facet and ligamentum flavum hypertrophy result in new mild left and borderline right lateral recess stenosis without significant spinal or neural foraminal stenosis. Small facet joint effusions.   L4-5: Mild disc bulging, a new small right paracentral to subarticular disc protrusion, and mild-to-moderate facet and ligamentum flavum hypertrophy result in new moderate right lateral recess stenosis with potential right L5 nerve root impingement. No spinal or neural foraminal stenosis.   L5-S1: Circumferential disc osteophyte complex, disc space height loss, and mild facet hypertrophy result in mild bilateral neural foraminal stenosis without spinal stenosis, unchanged.   IMPRESSION: 1. New right subarticular disc protrusion at L4-5 with right lateral recess stenosis and potential L5 nerve root  impingement. 2. New mild left lateral recess stenosis at L3-4. 3. New advanced disc degeneration at T12-L1 without stenosis. 4. Chronically advanced L5-S1 disc degeneration with unchanged mild bilateral neural foraminal stenosis.     Electronically Signed   By: Logan Bores M.D.   On: 11/07/2017 13:46   She reports that she has never smoked. She has never used smokeless tobacco.  Recent Labs    07/20/20 0821  HGBA1C 5.4    Objective:  VS:  HT:5\' 3"  (160 cm)   WT:124 lb (56.2 kg)  BMI:21.97    BP:118/63  HR:72bpm  TEMP: ( )  RESP:  Physical Exam Vitals and nursing note reviewed.  HENT:     Head: Normocephalic and atraumatic.     Right Ear: External ear normal.     Left Ear: External ear normal.     Nose: Nose normal.     Mouth/Throat:     Mouth: Mucous membranes are moist.  Eyes:     Extraocular Movements: Extraocular movements intact.  Cardiovascular:     Rate and Rhythm: Normal rate.     Pulses: Normal pulses.  Pulmonary:     Effort: Pulmonary effort is normal.  Abdominal:     General: Abdomen is flat. There is no distension.  Musculoskeletal:        General: Tenderness present.     Cervical back: Normal range of motion.     Comments: Pt is slow to rise from seated position to standing. Good lumbar range of  motion. Strong distal strength without clonus, no pain upon palpation of greater trochanters. Sensation intact bilaterally. Dysesthesias noted to right L5 dermatome. Walks independently, gait steady.    Skin:    General: Skin is warm and dry.     Capillary Refill: Capillary refill takes less than 2 seconds.  Neurological:     General: No focal deficit present.     Mental Status: She is alert.  Psychiatric:        Mood and Affect: Mood normal.    Ortho Exam  Imaging: No results found.  Past Medical/Family/Surgical/Social History: Medications & Allergies reviewed per EMR, new medications updated. Patient Active Problem List   Diagnosis Date Noted    Pain in right elbow 10/15/2020   Bilateral primary osteoarthritis of knee 09/26/2019   Pain in right hip 04/30/2019   Neck pain on right side 12/25/2018   Pain in right shoulder 12/11/2018   Pain in right hand 05/18/2018   Pain in left hand 05/18/2018   Unilateral primary osteoarthritis, right knee 08/24/2016   Hypertriglyceridemia without hypercholesterolemia 09/03/2013   CHEST PAIN-PRECORDIAL 08/31/2009   CHEST PAIN, ATYPICAL 08/28/2009   Past Medical History:  Diagnosis Date   Allergy    Seasonal   Arthritis    back   GERD (gastroesophageal reflux disease)    in past   Family History  Problem Relation Age of Onset   Parkinson's disease Mother    Brain cancer Father    Breast cancer Neg Hx    Past Surgical History:  Procedure Laterality Date   CERVICAL DISC SURGERY     over 10 years   COLONOSCOPY     Social History   Occupational History   Not on file  Tobacco Use   Smoking status: Never   Smokeless tobacco: Never  Vaping Use   Vaping Use: Never used  Substance and Sexual Activity   Alcohol use: No   Drug use: No   Sexual activity: Not on file

## 2021-02-22 NOTE — Progress Notes (Signed)
Patient has been seeing Dr. Durward Fortes for right leg pain and having injections.  She now is having throbbing in her right leg (upper and lower) when she lays down at night. The only way she can get comfortable to sleep is on her back and she is not a back sleeper. She denies numbness and tingling. She takes voltaren daily and uses voltaren gel at night.    Numeric Pain Rating Scale and Functional Assessment Average Pain 8   In the last MONTH (on 0-10 scale) has pain interfered with the following?  1. General activity like being  able to carry out your everyday physical activities such as walking, climbing stairs, carrying groceries, or moving a chair?  Rating(5)    -BT, -Dye Allergies.

## 2021-03-03 ENCOUNTER — Encounter: Payer: Self-pay | Admitting: Physical Medicine and Rehabilitation

## 2021-03-03 ENCOUNTER — Ambulatory Visit: Payer: Self-pay

## 2021-03-03 ENCOUNTER — Ambulatory Visit: Payer: PPO | Admitting: Physical Medicine and Rehabilitation

## 2021-03-03 ENCOUNTER — Other Ambulatory Visit: Payer: Self-pay

## 2021-03-03 VITALS — BP 107/73 | HR 78

## 2021-03-03 DIAGNOSIS — M5416 Radiculopathy, lumbar region: Secondary | ICD-10-CM

## 2021-03-03 MED ORDER — METHYLPREDNISOLONE ACETATE 80 MG/ML IJ SUSP
80.0000 mg | Freq: Once | INTRAMUSCULAR | Status: AC
  Administered 2021-03-03: 09:00:00 80 mg

## 2021-03-03 NOTE — Procedures (Signed)
Lumbosacral Transforaminal Epidural Steroid Injection - Sub-Pedicular Approach with Fluoroscopic Guidance  [name]      Date of Birth: 1945/06/14 MRN: [id number] PCP: [name], MD      Visit Date: 03/03/2021   Universal Protocol:    Date/Time: 03/03/2021  Consent Given By: the patient  Position: PRONE  Additional Comments: Vital signs were monitored before and after the procedure. Patient was prepped and draped in the usual sterile fashion. The correct patient, procedure, and site was verified.   Injection Procedure Details:   Procedure diagnoses: Lumbar radiculopathy [M54.16]    Meds Administered:  Meds ordered this encounter  Medications   methylPREDNISolone acetate (DEPO-MEDROL) injection 80 mg    Laterality: Right  Location/Site: L5  Needle:5.0 in., 22 ga.  Short bevel or Quincke spinal needle  Needle Placement: Transforaminal  Findings:    -Comments: Excellent flow of contrast along the nerve, nerve root and into the epidural space.  Procedure Details: After squaring off the end-plates to get a true AP view, the C-arm was positioned so that an oblique view of the foramen as noted above was visualized. The target area is just inferior to the "nose of the scotty dog" or sub pedicular. The soft tissues overlying this structure were infiltrated with 2-3 ml. of 1% Lidocaine without Epinephrine.  The spinal needle was inserted toward the target using a "trajectory" view along the fluoroscope beam.  Under AP and lateral visualization, the needle was advanced so it did not puncture dura and was located close the 6 O'Clock position of the pedical in AP tracterory. Biplanar projections were used to confirm position. Aspiration was confirmed to be negative for CSF and/or blood. A 1-2 ml. volume of Isovue-250 was injected and flow of contrast was noted at each level. Radiographs were obtained for documentation purposes.   After attaining the  desired flow of contrast documented above, a 0.5 to 1.0 ml test dose of 0.25% Marcaine was injected into each respective transforaminal space.  The patient was observed for 90 seconds post injection.  After no sensory deficits were reported, and normal lower extremity motor function was noted,   the above injectate was administered so that equal amounts of the injectate were placed at each foramen (level) into the transforaminal epidural space.   Additional Comments:  The patient tolerated the procedure well Dressing: 2 x 2 sterile gauze and Band-Aid    Post-procedure details: Patient was observed during the procedure. Post-procedure instructions were reviewed.  Patient left the clinic in stable condition.

## 2021-03-03 NOTE — Progress Notes (Signed)
Taylor Taylor - 75 y.o. female MRN [id number]  Date of birth: 1945-09-30  Office Visit Note: Visit Date: 03/03/2021 PCP: [name], MD Referred by: [name], MD  Subjective: Chief Complaint  Patient presents with   Lower Back - Pain   Right Leg - Pain   HPI:  Taylor Taylor is a 75 y.o. female who comes in today at the request of Barnet Pall, FNP for planned Right L5-S1 Lumbar Transforaminal epidural steroid injection with fluoroscopic guidance.  The patient has failed conservative care including home exercise, medications, time and activity modification.  This injection will be diagnostic and hopefully therapeutic.  Please see requesting physician notes for further details and justification.  ROS Otherwise per HPI.  Assessment & Plan: Visit Diagnoses:    ICD-10-CM   1. Lumbar radiculopathy  M54.16 XR C-ARM NO REPORT    Epidural Steroid injection    methylPREDNISolone acetate (DEPO-MEDROL) injection 80 mg      Plan: No additional findings.   Meds & Orders:  Meds ordered this encounter  Medications   methylPREDNISolone acetate (DEPO-MEDROL) injection 80 mg    Orders Placed This Encounter  Procedures   XR C-ARM NO REPORT   Epidural Steroid injection    Follow-up: Return if symptoms worsen or fail to improve.   Procedures: No procedures performed  Lumbosacral Transforaminal Epidural Steroid Injection - Sub-Pedicular Approach with Fluoroscopic Guidance  [name]      Date of Birth: 13-Sep-1945 MRN: [id number] PCP: [name], MD      Visit Date: 03/03/2021   Universal Protocol:    Date/Time: 03/03/2021  Consent Given By: the patient  Position: PRONE  Additional Comments: Vital signs were monitored before and after the procedure. Patient was prepped and draped in the usual sterile fashion. The correct patient, procedure, and site was verified.   Injection Procedure Details:   Procedure diagnoses:  Lumbar radiculopathy [M54.16]    Meds Administered:  Meds ordered this encounter  Medications   methylPREDNISolone acetate (DEPO-MEDROL) injection 80 mg    Laterality: Right  Location/Site: L5  Needle:5.0 in., 22 ga.  Short bevel or Quincke spinal needle  Needle Placement: Transforaminal  Findings:    -Comments: Excellent flow of contrast along the nerve, nerve root and into the epidural space.  Procedure Details: After squaring off the end-plates to get a true AP view, the C-arm was positioned so that an oblique view of the foramen as noted above was visualized. The target area is just inferior to the "nose of the scotty dog" or sub pedicular. The soft tissues overlying this structure were infiltrated with 2-3 ml. of 1% Lidocaine without Epinephrine.  The spinal needle was inserted toward the target using a "trajectory" view along the fluoroscope beam.  Under AP and lateral visualization, the needle was advanced so it did not puncture dura and was located close the 6 O'Clock position of the pedical in AP tracterory. Biplanar projections were used to confirm position. Aspiration was confirmed to be negative for CSF and/or blood. A 1-2 ml. volume of Isovue-250 was injected and flow of contrast was noted at each level. Radiographs were obtained for documentation purposes.   After attaining the desired flow of contrast documented above, a 0.5 to 1.0 ml test dose of 0.25% Marcaine was injected into each respective transforaminal space.  The patient was observed for 90 seconds post injection.  After no sensory deficits were reported, and normal lower extremity motor function was noted,  the above injectate was administered so that equal amounts of the injectate were placed at each foramen (level) into the transforaminal epidural space.   Additional Comments:  The patient tolerated the procedure well Dressing: 2 x 2 sterile gauze and Band-Aid    Post-procedure details: Patient was  observed during the procedure. Post-procedure instructions were reviewed.  Patient left the clinic in stable condition.   Clinical History: MRI LUMBAR SPINE WITHOUT CONTRAST   TECHNIQUE: Multiplanar, multisequence MR imaging of the lumbar spine was performed. No intravenous contrast was administered.   COMPARISON:  12/23/2008   FINDINGS: Segmentation:  Standard.   Alignment: New trace anterolisthesis of T12 on L1. Unchanged trace retrolisthesis of L5 on S1.   Vertebrae: No fracture. Minimal degenerative endplate edema at S5-K8. Unchanged 6 mm STIR hyperintense focus in the L3 vertebral body, likely a benign hemangioma.   Conus medullaris and cauda equina: Conus extends to the L1 level. Conus and cauda equina appear normal.   Paraspinal and other soft tissues: Unremarkable.   Disc levels:   Disc desiccation from L2-3 to L5-S1. Chronic severe disc space narrowing at L5-S1.   T12-L1: New severe disc space narrowing. Mild disc bulging and facet arthrosis without stenosis.   L1-2: Negative.   L2-3: Mild disc bulging, small left foraminal disc protrusion, and mild facet and ligamentum flavum hypertrophy. Disc degeneration has slightly progressed, overall mildly progressed from prior but without significant stenosis. Small facet joint effusions.   L3-4: Mild disc bulging and mild-to-moderate facet and ligamentum flavum hypertrophy result in new mild left and borderline right lateral recess stenosis without significant spinal or neural foraminal stenosis. Small facet joint effusions.   L4-5: Mild disc bulging, a new small right paracentral to subarticular disc protrusion, and mild-to-moderate facet and ligamentum flavum hypertrophy result in new moderate right lateral recess stenosis with potential right L5 nerve root impingement. No spinal or neural foraminal stenosis.   L5-S1: Circumferential disc osteophyte complex, disc space height loss, and mild facet hypertrophy  result in mild bilateral neural foraminal stenosis without spinal stenosis, unchanged.   IMPRESSION: 1. New right subarticular disc protrusion at L4-5 with right lateral recess stenosis and potential L5 nerve root impingement. 2. New mild left lateral recess stenosis at L3-4. 3. New advanced disc degeneration at T12-L1 without stenosis. 4. Chronically advanced L5-S1 disc degeneration with unchanged mild bilateral neural foraminal stenosis.     Electronically Signed   By: Logan Bores M.D.   On: 11/07/2017 13:46     Objective:  VS:  HT:     WT:    BMI:      BP:107/73   HR:78bpm   TEMP: ( )   RESP:  Physical Exam Vitals and nursing note reviewed.  Constitutional:      General: She is not in acute distress.    Appearance: Normal appearance. She is not ill-appearing.  HENT:     Head: Normocephalic and atraumatic.     Right Ear: External ear normal.     Left Ear: External ear normal.  Eyes:     Extraocular Movements: Extraocular movements intact.  Cardiovascular:     Rate and Rhythm: Normal rate.     Pulses: Normal pulses.  Pulmonary:     Effort: Pulmonary effort is normal. No respiratory distress.  Abdominal:     General: There is no distension.     Palpations: Abdomen is soft.  Musculoskeletal:        General: Tenderness present.     Cervical back:  Neck supple.     Right lower leg: No edema.     Left lower leg: No edema.     Comments: Patient has good distal strength with no pain over the greater trochanters.  No clonus or focal weakness.  Skin:    Findings: No erythema, lesion or rash.  Neurological:     General: No focal deficit present.     Mental Status: She is alert and oriented to person, place, and time.     Sensory: No sensory deficit.     Motor: No weakness or abnormal muscle tone.     Coordination: Coordination normal.  Psychiatric:        Mood and Affect: Mood normal.        Behavior: Behavior normal.     Imaging: No results found.

## 2021-03-03 NOTE — Progress Notes (Signed)
Pt state lower back pain that travels down her right lag. Pt state laying down makes the pain worse. Pt state she takes over the counter pain meds to help ease her pain.  Numeric Pain Rating Scale and Functional Assessment Average Pain 7   In the last MONTH (on 0-10 scale) has pain interfered with the following?  1. General activity like being  able to carry out your everyday physical activities such as walking, climbing stairs, carrying groceries, or moving a chair?  Rating(10)   +Driver, -BT, -Dye Allergies.

## 2021-03-24 ENCOUNTER — Telehealth: Payer: Self-pay | Admitting: Physical Medicine and Rehabilitation

## 2021-03-24 NOTE — Telephone Encounter (Signed)
Pt called stating she got a back injection 3-4 weeks ago and would like to get another; she would like a CB to set that appt.   [phone]

## 2021-04-01 ENCOUNTER — Ambulatory Visit: Payer: Self-pay

## 2021-04-01 ENCOUNTER — Other Ambulatory Visit: Payer: Self-pay

## 2021-04-01 ENCOUNTER — Ambulatory Visit: Payer: PPO | Admitting: Physical Medicine and Rehabilitation

## 2021-04-01 ENCOUNTER — Encounter: Payer: Self-pay | Admitting: Physical Medicine and Rehabilitation

## 2021-04-01 VITALS — BP 107/75 | HR 91

## 2021-04-01 DIAGNOSIS — M5416 Radiculopathy, lumbar region: Secondary | ICD-10-CM | POA: Diagnosis not present

## 2021-04-01 MED ORDER — METHYLPREDNISOLONE ACETATE 80 MG/ML IJ SUSP
80.0000 mg | Freq: Once | INTRAMUSCULAR | Status: AC
  Administered 2021-04-01: 80 mg

## 2021-04-01 NOTE — Procedures (Signed)
Lumbosacral Transforaminal Epidural Steroid Injection - Sub-Pedicular Approach with Fluoroscopic Guidance  [name]      Date of Birth: 04-16-1945 MRN: [id number] PCP: [name], MD      Visit Date: 04/01/2021   Universal Protocol:    Date/Time: 04/01/2021  Consent Given By: the patient  Position: PRONE  Additional Comments: Vital signs were monitored before and after the procedure. Patient was prepped and draped in the usual sterile fashion. The correct patient, procedure, and site was verified.   Injection Procedure Details:   Procedure diagnoses: Lumbar radiculopathy [M54.16]    Meds Administered:  Meds ordered this encounter  Medications   methylPREDNISolone acetate (DEPO-MEDROL) injection 80 mg    Laterality: Right  Location/Site: L5  Needle:5.0 in., 22 ga.  Short bevel or Quincke spinal needle  Needle Placement: Transforaminal  Findings:    -Comments: Excellent flow of contrast along the nerve, nerve root and into the epidural space.  Procedure Details: After squaring off the end-plates to get a true AP view, the C-arm was positioned so that an oblique view of the foramen as noted above was visualized. The target area is just inferior to the "nose of the scotty dog" or sub pedicular. The soft tissues overlying this structure were infiltrated with 2-3 ml. of 1% Lidocaine without Epinephrine.  The spinal needle was inserted toward the target using a "trajectory" view along the fluoroscope beam.  Under AP and lateral visualization, the needle was advanced so it did not puncture dura and was located close the 6 O'Clock position of the pedical in AP tracterory. Biplanar projections were used to confirm position. Aspiration was confirmed to be negative for CSF and/or blood. A 1-2 ml. volume of Isovue-250 was injected and flow of contrast was noted at each level. Radiographs were obtained for documentation purposes.   After attaining the  desired flow of contrast documented above, a 0.5 to 1.0 ml test dose of 0.25% Marcaine was injected into each respective transforaminal space.  The patient was observed for 90 seconds post injection.  After no sensory deficits were reported, and normal lower extremity motor function was noted,   the above injectate was administered so that equal amounts of the injectate were placed at each foramen (level) into the transforaminal epidural space.   Additional Comments:  The patient tolerated the procedure well Dressing: 2 x 2 sterile gauze and Band-Aid    Post-procedure details: Patient was observed during the procedure. Post-procedure instructions were reviewed.  Patient left the clinic in stable condition.

## 2021-04-01 NOTE — Patient Instructions (Signed)

## 2021-04-01 NOTE — Progress Notes (Signed)
Pt state lower back pain that travels down her right leg. Pt state laying down makes the pain worse. Pt state she takes over the counter pain meds to help ease her pain.  Numeric Pain Rating Scale and Functional Assessment Average Pain 5   In the last MONTH (on 0-10 scale) has pain interfered with the following?  1. General activity like being  able to carry out your everyday physical activities such as walking, climbing stairs, carrying groceries, or moving a chair?  Rating(9)   +Driver, -BT, -Dye Allergies.

## 2021-04-01 NOTE — Progress Notes (Signed)
Taylor Taylor - 76 y.o. female MRN [id number]  Date of birth: 01-11-1946  Office Visit Note: Visit Date: 04/01/2021 PCP: [name], MD Referred by: [name], MD  Subjective: Chief Complaint  Patient presents with   Lower Back - Pain   Right Leg - Pain   HPI:  Taylor Taylor is a 76 y.o. female who comes in today for planned repeat Right L5-S1  Lumbar Transforaminal epidural steroid injection with fluoroscopic guidance.  The patient has failed conservative care including home exercise, medications, time and activity modification.  This injection will be diagnostic and hopefully therapeutic.  Please see requesting physician notes for further details and justification. Patient received more than 50% pain relief from prior injection. Over 80% for 2 weeks. If not long lasting will get new lumbar spine MRI as last was 2019.  Referring: Barnet Pall, FNP  ROS Otherwise per HPI.  Assessment & Plan: Visit Diagnoses:    ICD-10-CM   1. Lumbar radiculopathy  M54.16 XR C-ARM NO REPORT    Epidural Steroid injection    methylPREDNISolone acetate (DEPO-MEDROL) injection 80 mg      Plan: No additional findings.   Meds & Orders:  Meds ordered this encounter  Medications   methylPREDNISolone acetate (DEPO-MEDROL) injection 80 mg    Orders Placed This Encounter  Procedures   XR C-ARM NO REPORT   Epidural Steroid injection    Follow-up: Return for visit to requesting provider as needed.   Procedures: No procedures performed  Lumbosacral Transforaminal Epidural Steroid Injection - Sub-Pedicular Approach with Fluoroscopic Guidance  [name]      Date of Birth: [birth year] MRN: [id number] PCP: [name], MD      Visit Date: 04/01/2021   Universal Protocol:    Date/Time: 04/01/2021  Consent Given By: the patient  Position: PRONE  Additional Comments: Vital signs were monitored before and after the procedure. Patient was  prepped and draped in the usual sterile fashion. The correct patient, procedure, and site was verified.   Injection Procedure Details:   Procedure diagnoses: Lumbar radiculopathy [M54.16]    Meds Administered:  Meds ordered this encounter  Medications   methylPREDNISolone acetate (DEPO-MEDROL) injection 80 mg    Laterality: Right  Location/Site: L5  Needle:5.0 in., 22 ga.  Short bevel or Quincke spinal needle  Needle Placement: Transforaminal  Findings:    -Comments: Excellent flow of contrast along the nerve, nerve root and into the epidural space.  Procedure Details: After squaring off the end-plates to get a true AP view, the C-arm was positioned so that an oblique view of the foramen as noted above was visualized. The target area is just inferior to the "nose of the scotty dog" or sub pedicular. The soft tissues overlying this structure were infiltrated with 2-3 ml. of 1% Lidocaine without Epinephrine.  The spinal needle was inserted toward the target using a "trajectory" view along the fluoroscope beam.  Under AP and lateral visualization, the needle was advanced so it did not puncture dura and was located close the 6 O'Clock position of the pedical in AP tracterory. Biplanar projections were used to confirm position. Aspiration was confirmed to be negative for CSF and/or blood. A 1-2 ml. volume of Isovue-250 was injected and flow of contrast was noted at each level. Radiographs were obtained for documentation purposes.   After attaining the desired flow of contrast documented above, a 0.5 to 1.0 ml test dose of 0.25% Marcaine was injected into  each respective transforaminal space.  The patient was observed for 90 seconds post injection.  After no sensory deficits were reported, and normal lower extremity motor function was noted,   the above injectate was administered so that equal amounts of the injectate were placed at each foramen (level) into the transforaminal epidural  space.   Additional Comments:  The patient tolerated the procedure well Dressing: 2 x 2 sterile gauze and Band-Aid    Post-procedure details: Patient was observed during the procedure. Post-procedure instructions were reviewed.  Patient left the clinic in stable condition.    Clinical History: MRI LUMBAR SPINE WITHOUT CONTRAST   TECHNIQUE: Multiplanar, multisequence MR imaging of the lumbar spine was performed. No intravenous contrast was administered.   COMPARISON:  12/23/2008   FINDINGS: Segmentation:  Standard.   Alignment: New trace anterolisthesis of T12 on L1. Unchanged trace retrolisthesis of L5 on S1.   Vertebrae: No fracture. Minimal degenerative endplate edema at H4-T6. Unchanged 6 mm STIR hyperintense focus in the L3 vertebral body, likely a benign hemangioma.   Conus medullaris and cauda equina: Conus extends to the L1 level. Conus and cauda equina appear normal.   Paraspinal and other soft tissues: Unremarkable.   Disc levels:   Disc desiccation from L2-3 to L5-S1. Chronic severe disc space narrowing at L5-S1.   T12-L1: New severe disc space narrowing. Mild disc bulging and facet arthrosis without stenosis.   L1-2: Negative.   L2-3: Mild disc bulging, small left foraminal disc protrusion, and mild facet and ligamentum flavum hypertrophy. Disc degeneration has slightly progressed, overall mildly progressed from prior but without significant stenosis. Small facet joint effusions.   L3-4: Mild disc bulging and mild-to-moderate facet and ligamentum flavum hypertrophy result in new mild left and borderline right lateral recess stenosis without significant spinal or neural foraminal stenosis. Small facet joint effusions.   L4-5: Mild disc bulging, a new small right paracentral to subarticular disc protrusion, and mild-to-moderate facet and ligamentum flavum hypertrophy result in new moderate right lateral recess stenosis with potential right L5  nerve root impingement. No spinal or neural foraminal stenosis.   L5-S1: Circumferential disc osteophyte complex, disc space height loss, and mild facet hypertrophy result in mild bilateral neural foraminal stenosis without spinal stenosis, unchanged.   IMPRESSION: 1. New right subarticular disc protrusion at L4-5 with right lateral recess stenosis and potential L5 nerve root impingement. 2. New mild left lateral recess stenosis at L3-4. 3. New advanced disc degeneration at T12-L1 without stenosis. 4. Chronically advanced L5-S1 disc degeneration with unchanged mild bilateral neural foraminal stenosis.     Electronically Signed   By: Logan Bores M.D.   On: 11/07/2017 13:46     Objective:  VS:  HT:     WT:    BMI:      BP:107/75   HR:91bpm   TEMP: ( )   RESP:  Physical Exam Vitals and nursing note reviewed.  Constitutional:      General: She is not in acute distress.    Appearance: Normal appearance. She is not ill-appearing.  HENT:     Head: Normocephalic and atraumatic.     Right Ear: External ear normal.     Left Ear: External ear normal.  Eyes:     Extraocular Movements: Extraocular movements intact.  Cardiovascular:     Rate and Rhythm: Normal rate.     Pulses: Normal pulses.  Pulmonary:     Effort: Pulmonary effort is normal. No respiratory distress.  Abdominal:  General: There is no distension.     Palpations: Abdomen is soft.  Musculoskeletal:        General: Tenderness present.     Cervical back: Neck supple.     Right lower leg: No edema.     Left lower leg: No edema.     Comments: Patient has good distal strength with no pain over the greater trochanters.  No clonus or focal weakness.  Skin:    Findings: No erythema, lesion or rash.  Neurological:     General: No focal deficit present.     Mental Status: She is alert and oriented to person, place, and time.     Sensory: No sensory deficit.     Motor: No weakness or abnormal muscle tone.      Coordination: Coordination normal.  Psychiatric:        Mood and Affect: Mood normal.        Behavior: Behavior normal.     Imaging: No results found.

## 2021-04-27 ENCOUNTER — Ambulatory Visit (INDEPENDENT_AMBULATORY_CARE_PROVIDER_SITE_OTHER): Payer: PPO | Admitting: Family Medicine

## 2021-04-27 ENCOUNTER — Encounter: Payer: Self-pay | Admitting: Family Medicine

## 2021-04-27 ENCOUNTER — Other Ambulatory Visit: Payer: Self-pay

## 2021-04-27 VITALS — BP 118/62 | HR 74 | Temp 98.0°F | Resp 16 | Wt 127.0 lb

## 2021-04-27 DIAGNOSIS — A084 Viral intestinal infection, unspecified: Secondary | ICD-10-CM | POA: Diagnosis not present

## 2021-04-27 NOTE — Progress Notes (Signed)
Subjective:    Patient ID: Taylor Taylor, female    DOB: 1946/02/18, 76 y.o.   MRN: [id number]  HPI  Patient is a very pleasant 76 year old Caucasian female who presents today to discuss diarrhea.  The diarrhea began Sunday.  She denies any bloody diarrhea.  She denies any fevers.  She denies any intense lower abdominal pain.  She denies any vomiting.  The diarrhea is slightly better today.  She denies any recent travel.  She is taken to negative COVID test.  She denies any cough or chest congestion or shortness of breath.  On a side note she would like to resume her hormones.  We did stop them due to potential risk of breast cancer, etc.  However she states that the hot flashes are unbearable.  She is on venlafaxine and trying black cohosh with no significant relief.  She is aware of the increased risk of breast cancer, thromboembolism, stroke and she is willing to accept the slight increased risk for her age range on hormones due to the quality of life issues that she is experiencing. Past Medical History:  Diagnosis Date   Allergy    Seasonal   Arthritis    back   GERD (gastroesophageal reflux disease)    in past   Past Surgical History:  Procedure Laterality Date   CERVICAL DISC SURGERY     over 10 years   COLONOSCOPY     Current Outpatient Medications on File Prior to Visit  Medication Sig Dispense Refill   acetaminophen (TYLENOL) 500 MG tablet Take 500 mg by mouth 2 (two) times daily.     alendronate (FOSAMAX) 70 MG tablet Take 1 tablet (70 mg total) by mouth every 7 (seven) days. Take with a full glass of water on an empty stomach. 12 tablet 3   Calcium Carbonate-Vit D-Min (CALCIUM 1200 PO) Take 1 tablet by mouth in the morning and at bedtime.     cholecalciferol (VITAMIN D3) 25 MCG (1000 UNIT) tablet Take 125 mcg by mouth daily.     diclofenac (VOLTAREN) 75 MG EC tablet Take 1 tablet (75 mg total) by mouth 2 (two) times daily. 90 tablet 2   ferrous sulfate 325 (65 FE) MG  tablet Take 325 mg by mouth daily with breakfast.     Fish Oil-Cholecalciferol (FISH OIL + D3) 1200-1000 MG-UNIT CAPS Take 1 tablet by mouth daily.     Glucosamine-Chondroit-Vit C-Mn (GLUCOSAMINE CHONDR 1500 COMPLX) CAPS Take 1 capsule by mouth 2 (two) times daily.     Melatonin 10 MG CAPS Take 10 mg by mouth at bedtime as needed (sleep).      Multiple Vitamins-Minerals (WOMENS MULTI VITAMIN & MINERAL PO) Take 1 tablet by mouth daily.     Red Yeast Rice 600 MG CAPS Take 600 mg by mouth daily.      Turmeric 500 MG TABS Take 500 mg by mouth daily.      venlafaxine XR (EFFEXOR XR) 150 MG 24 hr capsule Take 1 capsule (150 mg total) by mouth daily with breakfast. 90 capsule 2   vitamin B-12 (CYANOCOBALAMIN) 100 MCG tablet Take 100 mcg by mouth daily.     vitamin E 100 UNIT capsule Take 100 Units by mouth daily.     Current Facility-Administered Medications on File Prior to Visit  Medication Dose Route Frequency Provider Last Rate Last Admin   0.9 %  sodium chloride infusion  500 mL Intravenous Continuous Pyrtle, Lajuan Lines, MD  Allergies  Allergen Reactions   Pollen Extract Itching    sneezing   Social History   Socioeconomic History   Marital status: Married    Spouse name: Not on file   Number of children: Not on file   Years of education: Not on file   Highest education level: Not on file  Occupational History   Not on file  Tobacco Use   Smoking status: Never   Smokeless tobacco: Never  Vaping Use   Vaping Use: Never used  Substance and Sexual Activity   Alcohol use: No   Drug use: No   Sexual activity: Not on file  Other Topics Concern   Not on file  Social History Narrative   Not on file   Social Determinants of Health   Financial Resource Strain: Low Risk    Difficulty of Paying Living Expenses: Not hard at all  Food Insecurity: No Food Insecurity   Worried About Running Out of Food in the Last Year: Never true   [place] in the Last Year: Never true   Transportation Needs: No Transportation Needs   Lack of Transportation (Medical): No   Lack of Transportation (Non-Medical): No  Physical Activity: Sufficiently Active   Days of Exercise per Week: 5 days   Minutes of Exercise per Session: 70 min  Stress: No Stress Concern Present   Feeling of Stress : Not at all  Social Connections: Moderately Integrated   Frequency of Communication with Friends and Family: More than three times a week   Frequency of Social Gatherings with Friends and Family: Three times a week   Attends Religious Services: More than 4 times per year   Active Member of Clubs or Organizations: No   Attends Archivist Meetings: Never   Marital Status: Married  Human resources officer Violence: Not At Risk   Fear of Current or Ex-Partner: No   Emotionally Abused: No   Physically Abused: No   Sexually Abused: No    Past Medical History:  Diagnosis Date   Allergy    Seasonal   Arthritis    back   GERD (gastroesophageal reflux disease)    in past   Past Surgical History:  Procedure Laterality Date   CERVICAL DISC SURGERY     over 10 years   COLONOSCOPY     Current Outpatient Medications on File Prior to Visit  Medication Sig Dispense Refill   acetaminophen (TYLENOL) 500 MG tablet Take 500 mg by mouth 2 (two) times daily.     alendronate (FOSAMAX) 70 MG tablet Take 1 tablet (70 mg total) by mouth every 7 (seven) days. Take with a full glass of water on an empty stomach. 12 tablet 3   Calcium Carbonate-Vit D-Min (CALCIUM 1200 PO) Take 1 tablet by mouth in the morning and at bedtime.     cholecalciferol (VITAMIN D3) 25 MCG (1000 UNIT) tablet Take 125 mcg by mouth daily.     diclofenac (VOLTAREN) 75 MG EC tablet Take 1 tablet (75 mg total) by mouth 2 (two) times daily. 90 tablet 2   ferrous sulfate 325 (65 FE) MG tablet Take 325 mg by mouth daily with breakfast.     Fish Oil-Cholecalciferol (FISH OIL + D3) 1200-1000 MG-UNIT CAPS Take 1 tablet by mouth daily.      Glucosamine-Chondroit-Vit C-Mn (GLUCOSAMINE CHONDR 1500 COMPLX) CAPS Take 1 capsule by mouth 2 (two) times daily.     Melatonin 10 MG CAPS Take 10 mg by mouth at  bedtime as needed (sleep).      Multiple Vitamins-Minerals (WOMENS MULTI VITAMIN & MINERAL PO) Take 1 tablet by mouth daily.     Red Yeast Rice 600 MG CAPS Take 600 mg by mouth daily.      Turmeric 500 MG TABS Take 500 mg by mouth daily.      venlafaxine XR (EFFEXOR XR) 150 MG 24 hr capsule Take 1 capsule (150 mg total) by mouth daily with breakfast. 90 capsule 2   vitamin B-12 (CYANOCOBALAMIN) 100 MCG tablet Take 100 mcg by mouth daily.     vitamin E 100 UNIT capsule Take 100 Units by mouth daily.     Current Facility-Administered Medications on File Prior to Visit  Medication Dose Route Frequency Provider Last Rate Last Admin   0.9 %  sodium chloride infusion  500 mL Intravenous Continuous Pyrtle, Lajuan Lines, MD       Allergies  Allergen Reactions   Pollen Extract Itching    sneezing   Social History   Socioeconomic History   Marital status: Married    Spouse name: Not on file   Number of children: Not on file   Years of education: Not on file   Highest education level: Not on file  Occupational History   Not on file  Tobacco Use   Smoking status: Never   Smokeless tobacco: Never  Vaping Use   Vaping Use: Never used  Substance and Sexual Activity   Alcohol use: No   Drug use: No   Sexual activity: Not on file  Other Topics Concern   Not on file  Social History Narrative   Not on file   Social Determinants of Health   Financial Resource Strain: Low Risk    Difficulty of Paying Living Expenses: Not hard at all  Food Insecurity: No Food Insecurity   Worried About Estate manager/land agent of Food in the Last Year: Never true   Ran Out of Food in the Last Year: Never true  Transportation Needs: No Transportation Needs   Lack of Transportation (Medical): No   Lack of Transportation (Non-Medical): No  Physical Activity:  Sufficiently Active   Days of Exercise per Week: 5 days   Minutes of Exercise per Session: 70 min  Stress: No Stress Concern Present   Feeling of Stress : Not at all  Social Connections: Moderately Integrated   Frequency of Communication with Friends and Family: More than three times a week   Frequency of Social Gatherings with Friends and Family: Three times a week   Attends Religious Services: More than 4 times per year   Active Member of Clubs or Organizations: No   Attends Archivist Meetings: Never   Marital Status: Married  Human resources officer Violence: Not At Risk   Fear of Current or Ex-Partner: No   Emotionally Abused: No   Physically Abused: No   Sexually Abused: No      Review of Systems  All other systems reviewed and are negative.     Objective:   Physical Exam Vitals reviewed.  Constitutional:      General: She is not in acute distress.    Appearance: She is well-developed. She is not diaphoretic.  HENT:     Head: Normocephalic and atraumatic.     Right Ear: External ear normal.     Left Ear: External ear normal.     Nose: Nose normal.     Mouth/Throat:     Pharynx: No oropharyngeal exudate.  Eyes:  Conjunctiva/sclera: Conjunctivae normal.     Pupils: Pupils are equal, round, and reactive to light.  Neck:     Thyroid: No thyromegaly.     Vascular: No JVD.     Trachea: No tracheal deviation.  Cardiovascular:     Rate and Rhythm: Normal rate and regular rhythm.     Heart sounds: Normal heart sounds. No murmur heard.   No friction rub. No gallop.  Pulmonary:     Effort: Pulmonary effort is normal. No respiratory distress.     Breath sounds: Normal breath sounds. No wheezing or rales.  Chest:     Chest wall: No tenderness.  Abdominal:     General: Abdomen is flat. Bowel sounds are normal. There is no distension.     Palpations: Abdomen is soft. There is no shifting dullness, hepatomegaly or mass.     Tenderness: There is no abdominal  tenderness. There is no guarding or rebound.  Musculoskeletal:        General: No tenderness. Normal range of motion.     Cervical back: Neck supple.  Lymphadenopathy:     Cervical: No cervical adenopathy.  Skin:    Findings: No rash.  Neurological:     Mental Status: She is alert and oriented to person, place, and time.     Cranial Nerves: No cranial nerve deficit.     Motor: No abnormal muscle tone.     Coordination: Coordination normal.     Deep Tendon Reflexes: Reflexes normal.         Viral gastroenteritis I believe the patient has a viral gastroenteritis.  I recommended pushing fluids such as Gatorade and using Imodium over-the-counter as needed for diarrhea.  I anticipate gradual self-limited resolution over the next 2 to 3 days.  Recheck immediately if she develops high fever, bloody diarrhea, or abdominal pain.  We also discussed resuming hormone replacement therapy.  I outlined the increased risk of breast cancer, thromboembolism, and stroke.  She has no personal history and no family history of these.  Therefore she elects to resume hormone replacement in the future however she wants to recover from the viral illness first.

## 2021-04-28 ENCOUNTER — Telehealth: Payer: Self-pay

## 2021-04-28 NOTE — Telephone Encounter (Signed)
Pt called in stating that she just had a visit with pcp earlier this week and discussed using a hormone med. Pt states that she would like to go ahead and try that med if pcp would send the prescription for this med to [facility]. Please advise.  Cb#: [phone]

## 2021-04-29 NOTE — Telephone Encounter (Signed)
LMTRC

## 2021-04-29 NOTE — Telephone Encounter (Signed)
Patient returned call.  Requesting refills of the following hormones:  estradiol (ESTRACE) 1 MG tablet [[id number]]  progesterone (PROMETRIUM) 100 MG capsule [[id number]]  Pharmacy confirmed as   [facility] - [place], [place] - [address]  [address], [place] [address]  Phone:  [phone]  Fax:  [phone]   Please advise at [phone].

## 2021-04-30 ENCOUNTER — Encounter: Payer: Self-pay | Admitting: Family Medicine

## 2021-04-30 ENCOUNTER — Ambulatory Visit (INDEPENDENT_AMBULATORY_CARE_PROVIDER_SITE_OTHER): Payer: PPO | Admitting: Family Medicine

## 2021-04-30 ENCOUNTER — Other Ambulatory Visit: Payer: Self-pay | Admitting: Family Medicine

## 2021-04-30 ENCOUNTER — Other Ambulatory Visit: Payer: Self-pay

## 2021-04-30 VITALS — BP 112/68 | HR 84 | Temp 97.3°F | Resp 18 | Ht 63.0 in | Wt 127.0 lb

## 2021-04-30 DIAGNOSIS — R5383 Other fatigue: Secondary | ICD-10-CM | POA: Diagnosis not present

## 2021-04-30 DIAGNOSIS — G9339 Other post infection and related fatigue syndromes: Secondary | ICD-10-CM

## 2021-04-30 DIAGNOSIS — A084 Viral intestinal infection, unspecified: Secondary | ICD-10-CM

## 2021-04-30 DIAGNOSIS — E538 Deficiency of other specified B group vitamins: Secondary | ICD-10-CM | POA: Diagnosis not present

## 2021-04-30 MED ORDER — ESTRADIOL 1 MG PO TABS: 1.0000 mg | ORAL_TABLET | Freq: Every day | ORAL | 3 refills | Status: DC

## 2021-04-30 MED ORDER — PROGESTERONE MICRONIZED 100 MG PO CAPS: 100.0000 mg | ORAL_CAPSULE | Freq: Every day | ORAL | 3 refills | Status: DC

## 2021-04-30 NOTE — Progress Notes (Signed)
Subjective:    Patient ID: Taylor Taylor, female    DOB: [birth year], 76 y.o.   MRN: [id number]  HPI  04/27/21 Patient is a very pleasant 76 year old Caucasian female who presents today to discuss diarrhea.  The diarrhea began Sunday.  She denies any bloody diarrhea.  She denies any fevers.  She denies any intense lower abdominal pain.  She denies any vomiting.  The diarrhea is slightly better today.  She denies any recent travel.  She is taken two negative COVID tests.  She denies any cough or chest congestion or shortness of breath.  On a side note she would like to resume her hormones.  We did stop them due to potential risk of breast cancer, etc.  However she states that the hot flashes are unbearable.  She is on venlafaxine and trying black cohosh with no significant relief.  She is aware of the increased risk of breast cancer, thromboembolism, stroke and she is willing to accept the slight increased risk for her age range on hormones due to the quality of life issues that she is experiencing.  At that time, my plan was: I believe the patient has a viral gastroenteritis.  I recommended pushing fluids such as Gatorade and using Imodium over-the-counter as needed for diarrhea.  I anticipate gradual self-limited resolution over the next 2 to 3 days.  Recheck immediately if she develops high fever, bloody diarrhea, or abdominal pain.  We also discussed resuming hormone replacement therapy.  I outlined the increased risk of breast cancer, thromboembolism, and stroke.  She has no personal history and no family history of these.  Therefore she elects to resume hormone replacement in the future however she wants to recover from the viral illness first.  04/30/21 Patient returns today reporting malaise and fatigue.  The frequency of the diarrhea has improved.  She denies any bloody diarrhea however she does report constant nausea.  She is not vomiting however.  She really just feels tired and weak.  She  denies any fever.  She denies any sinus pain.  She denies any sore throat.  She denies any otalgia.  She denies any chest pain or cough, or pleurisy or shortness of breath.  She denies any hemoptysis.  She denies any abdominal pain.  She denies any dysuria or hematuria or urgency or frequency.  She denies any vaginal discharge.  She denies any rashes.  She does report occasional muscle aches Past Medical History:  Diagnosis Date   Allergy    Seasonal   Arthritis    back   GERD (gastroesophageal reflux disease)    in past   Past Surgical History:  Procedure Laterality Date   CERVICAL DISC SURGERY     over 10 years   COLONOSCOPY     Current Outpatient Medications on File Prior to Visit  Medication Sig Dispense Refill   acetaminophen (TYLENOL) 500 MG tablet Take 500 mg by mouth 2 (two) times daily.     alendronate (FOSAMAX) 70 MG tablet Take 1 tablet (70 mg total) by mouth every 7 (seven) days. Take with a full glass of water on an empty stomach. 12 tablet 3   Calcium Carbonate-Vit D-Min (CALCIUM 1200 PO) Take 1 tablet by mouth in the morning and at bedtime.     cholecalciferol (VITAMIN D3) 25 MCG (1000 UNIT) tablet Take 125 mcg by mouth daily.     diclofenac (VOLTAREN) 75 MG EC tablet Take 1 tablet (75 mg total) by mouth 2 (two)  times daily. 90 tablet 2   estradiol (ESTRACE) 1 MG tablet Take 1 tablet (1 mg total) by mouth daily. 90 tablet 3   ferrous sulfate 325 (65 FE) MG tablet Take 325 mg by mouth daily with breakfast.     Fish Oil-Cholecalciferol (FISH OIL + D3) 1200-1000 MG-UNIT CAPS Take 1 tablet by mouth daily.     Glucosamine-Chondroit-Vit C-Mn (GLUCOSAMINE CHONDR 1500 COMPLX) CAPS Take 1 capsule by mouth 2 (two) times daily.     Melatonin 10 MG CAPS Take 10 mg by mouth at bedtime as needed (sleep).      Multiple Vitamins-Minerals (WOMENS MULTI VITAMIN & MINERAL PO) Take 1 tablet by mouth daily.     progesterone (PROMETRIUM) 100 MG capsule Take 1 capsule (100 mg total) by mouth  daily. 90 capsule 3   Red Yeast Rice 600 MG CAPS Take 600 mg by mouth daily.      Turmeric 500 MG TABS Take 500 mg by mouth daily.      venlafaxine XR (EFFEXOR XR) 150 MG 24 hr capsule Take 1 capsule (150 mg total) by mouth daily with breakfast. 90 capsule 2   vitamin B-12 (CYANOCOBALAMIN) 100 MCG tablet Take 100 mcg by mouth daily.     vitamin E 100 UNIT capsule Take 100 Units by mouth daily.     Current Facility-Administered Medications on File Prior to Visit  Medication Dose Route Frequency Provider Last Rate Last Admin   0.9 %  sodium chloride infusion  500 mL Intravenous Continuous Pyrtle, Lajuan Lines, MD       Allergies  Allergen Reactions   Pollen Extract Itching    sneezing   Social History   Socioeconomic History   Marital status: Married    Spouse name: Not on file   Number of children: Not on file   Years of education: Not on file   Highest education level: Not on file  Occupational History   Not on file  Tobacco Use   Smoking status: Never   Smokeless tobacco: Never  Vaping Use   Vaping Use: Never used  Substance and Sexual Activity   Alcohol use: No   Drug use: No   Sexual activity: Not on file  Other Topics Concern   Not on file  Social History Narrative   Not on file   Social Determinants of Health   Financial Resource Strain: Low Risk    Difficulty of Paying Living Expenses: Not hard at all  Food Insecurity: No Food Insecurity   Worried About Estate manager/land agent of Food in the Last Year: Never true   Ran Out of Food in the Last Year: Never true  Transportation Needs: No Transportation Needs   Lack of Transportation (Medical): No   Lack of Transportation (Non-Medical): No  Physical Activity: Sufficiently Active   Days of Exercise per Week: 5 days   Minutes of Exercise per Session: 70 min  Stress: No Stress Concern Present   Feeling of Stress : Not at all  Social Connections: Moderately Integrated   Frequency of Communication with Friends and Family: More than  three times a week   Frequency of Social Gatherings with Friends and Family: Three times a week   Attends Religious Services: More than 4 times per year   Active Member of Clubs or Organizations: No   Attends Archivist Meetings: Never   Marital Status: Married  Human resources officer Violence: Not At Risk   Fear of Current or Ex-Partner: No   Emotionally Abused:  No   Physically Abused: No   Sexually Abused: No    Past Medical History:  Diagnosis Date   Allergy    Seasonal   Arthritis    back   GERD (gastroesophageal reflux disease)    in past   Past Surgical History:  Procedure Laterality Date   CERVICAL DISC SURGERY     over 10 years   COLONOSCOPY     Current Outpatient Medications on File Prior to Visit  Medication Sig Dispense Refill   acetaminophen (TYLENOL) 500 MG tablet Take 500 mg by mouth 2 (two) times daily.     alendronate (FOSAMAX) 70 MG tablet Take 1 tablet (70 mg total) by mouth every 7 (seven) days. Take with a full glass of water on an empty stomach. 12 tablet 3   Calcium Carbonate-Vit D-Min (CALCIUM 1200 PO) Take 1 tablet by mouth in the morning and at bedtime.     cholecalciferol (VITAMIN D3) 25 MCG (1000 UNIT) tablet Take 125 mcg by mouth daily.     diclofenac (VOLTAREN) 75 MG EC tablet Take 1 tablet (75 mg total) by mouth 2 (two) times daily. 90 tablet 2   estradiol (ESTRACE) 1 MG tablet Take 1 tablet (1 mg total) by mouth daily. 90 tablet 3   ferrous sulfate 325 (65 FE) MG tablet Take 325 mg by mouth daily with breakfast.     Fish Oil-Cholecalciferol (FISH OIL + D3) 1200-1000 MG-UNIT CAPS Take 1 tablet by mouth daily.     Glucosamine-Chondroit-Vit C-Mn (GLUCOSAMINE CHONDR 1500 COMPLX) CAPS Take 1 capsule by mouth 2 (two) times daily.     Melatonin 10 MG CAPS Take 10 mg by mouth at bedtime as needed (sleep).      Multiple Vitamins-Minerals (WOMENS MULTI VITAMIN & MINERAL PO) Take 1 tablet by mouth daily.     progesterone (PROMETRIUM) 100 MG capsule  Take 1 capsule (100 mg total) by mouth daily. 90 capsule 3   Red Yeast Rice 600 MG CAPS Take 600 mg by mouth daily.      Turmeric 500 MG TABS Take 500 mg by mouth daily.      venlafaxine XR (EFFEXOR XR) 150 MG 24 hr capsule Take 1 capsule (150 mg total) by mouth daily with breakfast. 90 capsule 2   vitamin B-12 (CYANOCOBALAMIN) 100 MCG tablet Take 100 mcg by mouth daily.     vitamin E 100 UNIT capsule Take 100 Units by mouth daily.     Current Facility-Administered Medications on File Prior to Visit  Medication Dose Route Frequency Provider Last Rate Last Admin   0.9 %  sodium chloride infusion  500 mL Intravenous Continuous Pyrtle, Lajuan Lines, MD       Allergies  Allergen Reactions   Pollen Extract Itching    sneezing   Social History   Socioeconomic History   Marital status: Married    Spouse name: Not on file   Number of children: Not on file   Years of education: Not on file   Highest education level: Not on file  Occupational History   Not on file  Tobacco Use   Smoking status: Never   Smokeless tobacco: Never  Vaping Use   Vaping Use: Never used  Substance and Sexual Activity   Alcohol use: No   Drug use: No   Sexual activity: Not on file  Other Topics Concern   Not on file  Social History Narrative   Not on file   Social Determinants of Health  Financial Resource Strain: Low Risk    Difficulty of Paying Living Expenses: Not hard at all  Food Insecurity: No Food Insecurity   Worried About Charity fundraiser in the Last Year: Never true   Ran Out of Food in the Last Year: Never true  Transportation Needs: No Transportation Needs   Lack of Transportation (Medical): No   Lack of Transportation (Non-Medical): No  Physical Activity: Sufficiently Active   Days of Exercise per Week: 5 days   Minutes of Exercise per Session: 70 min  Stress: No Stress Concern Present   Feeling of Stress : Not at all  Social Connections: Moderately Integrated   Frequency of  Communication with Friends and Family: More than three times a week   Frequency of Social Gatherings with Friends and Family: Three times a week   Attends Religious Services: More than 4 times per year   Active Member of Clubs or Organizations: No   Attends Archivist Meetings: Never   Marital Status: Married  Human resources officer Violence: Not At Risk   Fear of Current or Ex-Partner: No   Emotionally Abused: No   Physically Abused: No   Sexually Abused: No      Review of Systems  All other systems reviewed and are negative.     Objective:   Physical Exam Vitals reviewed.  Constitutional:      General: She is not in acute distress.    Appearance: She is well-developed. She is not diaphoretic.  HENT:     Head: Normocephalic and atraumatic.     Right Ear: External ear normal.     Left Ear: External ear normal.     Nose: Nose normal.     Mouth/Throat:     Pharynx: No oropharyngeal exudate.  Eyes:     Conjunctiva/sclera: Conjunctivae normal.     Pupils: Pupils are equal, round, and reactive to light.  Neck:     Thyroid: No thyromegaly.     Vascular: No JVD.     Trachea: No tracheal deviation.  Cardiovascular:     Rate and Rhythm: Normal rate and regular rhythm.     Heart sounds: Normal heart sounds. No murmur heard.   No friction rub. No gallop.  Pulmonary:     Effort: Pulmonary effort is normal. No respiratory distress.     Breath sounds: Normal breath sounds. No wheezing or rales.  Chest:     Chest wall: No tenderness.  Abdominal:     General: Abdomen is flat. Bowel sounds are normal. There is no distension.     Palpations: Abdomen is soft. There is no shifting dullness, hepatomegaly or mass.     Tenderness: There is no abdominal tenderness. There is no guarding or rebound.  Musculoskeletal:        General: No tenderness. Normal range of motion.     Cervical back: Neck supple.  Lymphadenopathy:     Cervical: No cervical adenopathy.  Skin:    Findings: No  rash.  Neurological:     Mental Status: She is alert and oriented to person, place, and time.     Cranial Nerves: No cranial nerve deficit.     Motor: No abnormal muscle tone.     Coordination: Coordination normal.     Deep Tendon Reflexes: Reflexes normal.         Viral gastroenteritis  Other post infection and related fatigue syndromes - Plan: CBC with Differential/Platelet, COMPLETE METABOLIC PANEL WITH GFR, TSH Patient's physical exam today  is reassuring.  She is not toxic appearing the remainder of her exam is benign.  I performed a COVID test today in the office which was negative. I will check CBC CMP and TSH.  I anticipate that her malaise and fatigue is postviral malaise and fatigue simply will take time to recover similar to mono or CMV.  Recommended rest and fluids.

## 2021-05-01 LAB — CBC WITH DIFFERENTIAL/PLATELET
Absolute Monocytes: 718 cells/uL (ref 200–950)
Basophils Absolute: 41 cells/uL (ref 0–200)
Basophils Relative: 0.6 %
Eosinophils Absolute: 179 cells/uL (ref 15–500)
Eosinophils Relative: 2.6 %
HCT: 40.1 % (ref 35.0–45.0)
Hemoglobin: 13.5 g/dL (ref 11.7–15.5)
Lymphs Abs: 1870 cells/uL (ref 850–3900)
MCH: 31.6 pg (ref 27.0–33.0)
MCHC: 33.7 g/dL (ref 32.0–36.0)
MCV: 93.9 fL (ref 80.0–100.0)
MPV: 9.2 fL (ref 7.5–12.5)
Monocytes Relative: 10.4 %
Neutro Abs: 4092 cells/uL (ref 1500–7800)
Neutrophils Relative %: 59.3 %
Platelets: 292 10*3/uL (ref 140–400)
RBC: 4.27 10*6/uL (ref 3.80–5.10)
RDW: 12.5 % (ref 11.0–15.0)
Total Lymphocyte: 27.1 %
WBC: 6.9 10*3/uL (ref 3.8–10.8)

## 2021-05-01 LAB — COMPLETE METABOLIC PANEL WITH GFR
AG Ratio: 2 (calc) (ref 1.0–2.5)
ALT: 17 U/L (ref 6–29)
AST: 14 U/L (ref 10–35)
Albumin: 4.4 g/dL (ref 3.6–5.1)
Alkaline phosphatase (APISO): 41 U/L (ref 37–153)
BUN: 23 mg/dL (ref 7–25)
CO2: 28 mmol/L (ref 20–32)
Calcium: 9.8 mg/dL (ref 8.6–10.4)
Chloride: 101 mmol/L (ref 98–110)
Creat: 0.87 mg/dL (ref 0.60–1.00)
Globulin: 2.2 g/dL (calc) (ref 1.9–3.7)
Glucose, Bld: 94 mg/dL (ref 65–99)
Potassium: 4.8 mmol/L (ref 3.5–5.3)
Sodium: 137 mmol/L (ref 135–146)
Total Bilirubin: 0.4 mg/dL (ref 0.2–1.2)
Total Protein: 6.6 g/dL (ref 6.1–8.1)
eGFR: 70 mL/min/{1.73_m2} (ref >=60)

## 2021-05-01 LAB — TSH: TSH: 1.09 mIU/L (ref 0.40–4.50)

## 2021-05-01 LAB — IRON: Iron: 92 ug/dL (ref 45–160)

## 2021-05-01 LAB — VITAMIN B12: Vitamin B-12: >2000 pg/mL — ABNORMAL HIGH (ref 200–1100)

## 2021-05-02 ENCOUNTER — Encounter: Payer: Self-pay | Admitting: Family Medicine

## 2021-05-03 ENCOUNTER — Encounter: Payer: Self-pay | Admitting: Family Medicine

## 2021-05-12 ENCOUNTER — Telehealth: Payer: Self-pay | Admitting: Physical Medicine and Rehabilitation

## 2021-05-12 DIAGNOSIS — M48061 Spinal stenosis, lumbar region without neurogenic claudication: Secondary | ICD-10-CM

## 2021-05-12 DIAGNOSIS — M5116 Intervertebral disc disorders with radiculopathy, lumbar region: Secondary | ICD-10-CM

## 2021-05-12 DIAGNOSIS — M5416 Radiculopathy, lumbar region: Secondary | ICD-10-CM

## 2021-05-12 NOTE — Telephone Encounter (Signed)
Patient called needing to schedule an appointment with Dr Ernestina Patches for an injection in her back. Patient is asking for a morning appointment. The number to contact patient is [phone]

## 2021-05-14 ENCOUNTER — Other Ambulatory Visit: Payer: Self-pay

## 2021-05-14 ENCOUNTER — Telehealth: Payer: Self-pay | Admitting: Pharmacist

## 2021-05-14 MED ORDER — DICLOFENAC SODIUM 75 MG PO TBEC: 75.0000 mg | DELAYED_RELEASE_TABLET | Freq: Two times a day (BID) | ORAL | 2 refills | Status: DC

## 2021-05-14 NOTE — Progress Notes (Addendum)
Chronic Care Management Pharmacy Assistant   Name: Lilyana Lippman  MRN: [id number] DOB: [birth year]   Reason for Encounter: Medication Coordination Call    Recent office visits:  04/30/2021 OV (PCP) [name], MD; no medication changes indicated.  04/27/2021 [facility] (PCP) [name], MD; no medication changes indicated.  Recent consult visits:  02/22/2021 [facility] (Phys Med) Magnus Sinning, MD; no medication changes indicated.  Hospital visits:  None in previous 6 months  Medications: Outpatient Encounter Medications as of 05/14/2021  Medication Sig   acetaminophen (TYLENOL) 500 MG tablet Take 500 mg by mouth 2 (two) times daily.   alendronate (FOSAMAX) 70 MG tablet Take 1 tablet (70 mg total) by mouth every 7 (seven) days. Take with a full glass of water on an empty stomach.   Calcium Carbonate-Vit D-Min (CALCIUM 1200 PO) Take 1 tablet by mouth in the morning and at bedtime.   cholecalciferol (VITAMIN D3) 25 MCG (1000 UNIT) tablet Take 125 mcg by mouth daily.   diclofenac (VOLTAREN) 75 MG EC tablet Take 1 tablet (75 mg total) by mouth 2 (two) times daily.   estradiol (ESTRACE) 1 MG tablet Take 1 tablet (1 mg total) by mouth daily.   ferrous sulfate 325 (65 FE) MG tablet Take 325 mg by mouth daily with breakfast.   Fish Oil-Cholecalciferol (FISH OIL + D3) 1200-1000 MG-UNIT CAPS Take 1 tablet by mouth daily.   Glucosamine-Chondroit-Vit C-Mn (GLUCOSAMINE CHONDR 1500 COMPLX) CAPS Take 1 capsule by mouth 2 (two) times daily.   Melatonin 10 MG CAPS Take 10 mg by mouth at bedtime as needed (sleep).    Multiple Vitamins-Minerals (WOMENS MULTI VITAMIN & MINERAL PO) Take 1 tablet by mouth daily.   progesterone (PROMETRIUM) 100 MG capsule Take 1 capsule (100 mg total) by mouth daily.   Red Yeast Rice 600 MG CAPS Take 600 mg by mouth daily.    Turmeric 500 MG TABS Take 500 mg by mouth daily.    venlafaxine XR (EFFEXOR XR) 150 MG 24 hr capsule Take 1 capsule (150 mg total) by  mouth daily with breakfast.   vitamin B-12 (CYANOCOBALAMIN) 100 MCG tablet Take 100 mcg by mouth daily.   vitamin E 100 UNIT capsule Take 100 Units by mouth daily.   Facility-Administered Encounter Medications as of 05/14/2021  Medication   0.9 %  sodium chloride infusion   Reviewed chart for medication changes ahead of medication coordination call.  BP Readings from Last 3 Encounters:  04/30/21 112/68  04/27/21 118/62  04/01/21 107/75    Lab Results  Component Value Date   HGBA1C 5.4 07/20/2020     Patient obtains medications through [facility]  90 Days   Last adherence delivery included:  Alendronate 70 mg once a week Diclofenac Sodium 75 mg twice a day Venlafaxine ER 150 mg once a day  Patient is due for next adherence delivery on: 05/25/2021. Called patient and reviewed medications and coordinated delivery.  This delivery to include: Alendronate 70 mg once a week Diclofenac Sodium 75 mg twice a day Venlafaxine ER 150 mg once a day Estradiol 1 mg once a day Progesterone 100 mg once a day  Patient needs refills for: Diclofenac Sodium 75 mg twice a day -Refill request sent to [facility]  Confirmed delivery date of 05/25/2021, advised patient that pharmacy will contact them the morning of delivery.   Care Gaps: Medicare Annual Wellness: Completed 07/23/2020 Hemoglobin A1C: 5.4% on 07/20/2020 Colonoscopy: Completed 06/07/2016 Dexa Scan: Completed Mammogram: Next due on  10/14/2021  Future Appointments  Date Time Provider [place]  07/29/2021  9:00 AM [facility] HEALTH ADVISOR [facility] None   April D Calhoun, [place] Pharmacist Assistant [phone]   10 minutes spent in review, coordination, and documentation.  Reviewed by: Beverly Milch, PharmD Clinical Pharmacist [phone]

## 2021-05-17 ENCOUNTER — Other Ambulatory Visit: Payer: Self-pay | Admitting: Physical Medicine and Rehabilitation

## 2021-05-17 ENCOUNTER — Telehealth: Payer: Self-pay | Admitting: Physical Medicine and Rehabilitation

## 2021-05-17 MED ORDER — TRAMADOL HCL 50 MG PO TABS: 50.0000 mg | ORAL_TABLET | Freq: Three times a day (TID) | ORAL | 0 refills | Status: DC | PRN

## 2021-05-17 NOTE — Telephone Encounter (Signed)
Pt called requesting to let Taylor Taylor know her MRI has been moved to tomorrow morning the  2/28. Call pt at [phone].

## 2021-05-17 NOTE — Telephone Encounter (Signed)
Patient called asked if she can get an injection before she gets the MRI? The number to contact patient is [phone]

## 2021-05-18 ENCOUNTER — Other Ambulatory Visit: Payer: Self-pay

## 2021-05-18 ENCOUNTER — Ambulatory Visit
Admission: RE | Admit: 2021-05-18 | Discharge: 2021-05-18 | Disposition: A | Discharge status: Home / Self Care | Payer: PPO | Source: Ambulatory Visit | Attending: Physical Medicine and Rehabilitation | Admitting: Physical Medicine and Rehabilitation

## 2021-05-18 DIAGNOSIS — M48061 Spinal stenosis, lumbar region without neurogenic claudication: Secondary | ICD-10-CM | POA: Diagnosis not present

## 2021-05-18 DIAGNOSIS — M545 Low back pain, unspecified: Secondary | ICD-10-CM | POA: Diagnosis not present

## 2021-05-19 ENCOUNTER — Telehealth: Payer: Self-pay | Admitting: Physical Medicine and Rehabilitation

## 2021-05-19 DIAGNOSIS — M5416 Radiculopathy, lumbar region: Secondary | ICD-10-CM

## 2021-05-19 NOTE — Telephone Encounter (Signed)
Need to schedule her for right L5-S1 interlam and tell her we can go over MRI at that time. 30 min. Placed order. ?

## 2021-05-27 ENCOUNTER — Ambulatory Visit: Payer: Self-pay

## 2021-05-27 ENCOUNTER — Encounter: Payer: Self-pay | Admitting: Physical Medicine and Rehabilitation

## 2021-05-27 ENCOUNTER — Other Ambulatory Visit: Payer: Self-pay

## 2021-05-27 ENCOUNTER — Ambulatory Visit: Payer: PPO | Admitting: Physical Medicine and Rehabilitation

## 2021-05-27 VITALS — BP 106/68 | HR 89

## 2021-05-27 DIAGNOSIS — M5416 Radiculopathy, lumbar region: Secondary | ICD-10-CM

## 2021-05-27 MED ORDER — METHYLPREDNISOLONE ACETATE 80 MG/ML IJ SUSP
80.0000 mg | Freq: Once | INTRAMUSCULAR | Status: AC
  Administered 2021-05-27: 80 mg

## 2021-05-27 NOTE — Patient Instructions (Signed)

## 2021-05-27 NOTE — Progress Notes (Signed)
Pt state lower back pain that travels down her right leg. Pt state laying down makes the pain worse. Pt state she takes over the counter pain meds to help ease her pain. Pt has hx of inj on 04/01/21 pt state it helped. Pt mention she still has sum pain while laying down. ? ?Numeric Pain Rating Scale and Functional Assessment ?Average Pain 5 ? ? ?In the last MONTH (on 0-10 scale) has pain interfered with the following? ? ?1. General activity like being  able to carry out your everyday physical activities such as walking, climbing stairs, carrying groceries, or moving a chair?  ?Rating(9) ? ? ?+Driver, -BT, -Dye Allergies. ? ?

## 2021-05-29 ENCOUNTER — Other Ambulatory Visit: Payer: PPO

## 2021-06-07 NOTE — Progress Notes (Signed)
? ?Taylor Taylor - 76 y.o. female MRN [id number]  Date of birth: [birth year] ? ?Office Visit Note: ?Visit Date: 05/27/2021 ?PCP: [name], MD ?Referred by: [name], MD ? ?Subjective: ?Chief Complaint  ?Patient presents with  ? Lower Back - Pain  ? Right Leg - Pain  ? ?HPI:  Taylor Taylor is a 77 y.o. female who comes in today at the request of Barnet Pall, FNP for planned Right L5-S1 Lumbar Interlaminar epidural steroid injection with fluoroscopic guidance.  The patient has failed conservative care including home exercise, medications, time and activity modification.  This injection will be diagnostic and hopefully therapeutic.  Please see requesting physician notes for further details and justification.  Patient had updated MRI of the lumbar spine since the last time we saw her.  This is reviewed below and reviewed with her today with spine models and imaging.  It really does not show any changes from 3 years ago.  She has lateral recess narrowing at L4-5 as well as by foraminal narrowing at L5-S1.  No frank nerve compression.  Pretty classic L5 radicular symptoms but unresponsive to recent L5 transforaminal injection at least for any long-term relief.  Diagnostically did help.  We are going to attempt interlaminar injection to see if that does any better.  Consideration would be given to get some medication management after that if it is not helping.  Does not seem to be overtly a surgical candidate.. ? ?ROS Otherwise per HPI. ? ?Assessment & Plan: ?Visit Diagnoses:  ?  ICD-10-CM   ?1. Lumbar radiculopathy  M54.16 XR C-ARM NO REPORT  ?  Epidural Steroid injection  ?  methylPREDNISolone acetate (DEPO-MEDROL) injection 80 mg  ?  ?  ?Plan: No additional findings.  ? ?Meds & Orders:  ?Meds ordered this encounter  ?Medications  ? methylPREDNISolone acetate (DEPO-MEDROL) injection 80 mg  ?  ?Orders Placed This Encounter  ?Procedures  ? XR C-ARM NO REPORT  ? Epidural Steroid injection  ?   ?Follow-up: Return if symptoms worsen or fail to improve.  ? ?Procedures: ?No procedures performed  ?Lumbar Epidural Steroid Injection - Interlaminar Approach with Fluoroscopic Guidance ? ?[name]      ?Date of Birth: 02-20-1946 ?MRN: [id number] ?PCP: [name], MD      ?Visit Date: 05/27/2021 ?  ?Universal Protocol:    ? ?Consent Given By: the patient ? ?Position: PRONE ? ?Additional Comments: ?Vital signs were monitored before and after the procedure. ?Patient was prepped and draped in the usual sterile fashion. ?The correct patient, procedure, and site was verified. ? ? ?Injection Procedure Details:  ? ?Procedure diagnoses: Lumbar radiculopathy [M54.16]  ? ?Meds Administered:  ?Meds ordered this encounter  ?Medications  ? methylPREDNISolone acetate (DEPO-MEDROL) injection 80 mg  ?  ? ?Laterality: Right ? ?Location/Site:  L5-S1 ? ?Needle: 3.5 in., 20 ga. Tuohy ? ?Needle Placement: Paramedian epidural ? ?Findings:  ? -Comments: Excellent flow of contrast into the epidural space. ? ?Procedure Details: ?Using a paramedian approach from the side mentioned above, the region overlying the inferior lamina was localized under fluoroscopic visualization and the soft tissues overlying this structure were infiltrated with 4 ml. of 1% Lidocaine without Epinephrine. The Tuohy needle was inserted into the epidural space using a paramedian approach.  ? ?The epidural space was localized using loss of resistance along with counter oblique bi-planar fluoroscopic views.  After negative aspirate for air, blood, and CSF, a 2 ml. volume of Isovue-250 was  injected into the epidural space and the flow of contrast was observed. Radiographs were obtained for documentation purposes.   ? ?The injectate was administered into the level noted above. ? ? ?Additional Comments:  ?The patient tolerated the procedure well ?Dressing: 2 x 2 sterile gauze and Band-Aid ?  ? ?Post-procedure details: ?Patient was observed  during the procedure. ?Post-procedure instructions were reviewed. ? ?Patient left the clinic in stable condition.   ? ?Clinical History: ?MRI LUMBAR SPINE WITHOUT CONTRAST ?  ?TECHNIQUE: ?Multiplanar, multisequence MR imaging of the lumbar spine was ?performed. No intravenous contrast was administered. ?  ?COMPARISON:  11/07/2017 ?  ?FINDINGS: ?Segmentation:  Standard. ?  ?Alignment:  Physiologic. ?  ?Vertebrae:  No fracture, evidence of discitis, or bone lesion. ?  ?Conus medullaris and cauda equina: Conus extends to the L1 level. ?Conus and cauda equina appear normal. ?  ?Paraspinal and other soft tissues: Negative. ?  ?Disc levels: ?  ?L1-L2: Normal disc space and facet joints. No spinal canal stenosis. ?No neural foraminal stenosis. ?  ?L2-L3: Progression of small disc bulge with facet hypertrophy. No ?spinal canal stenosis. No neural foraminal stenosis. ?  ?L3-L4: Unchanged left asymmetric disc bulge and mild facet ?hypertrophy. Left lateral recess narrowing without central spinal ?canal stenosis. No neural foraminal stenosis. ?  ?L4-L5: Unchanged right asymmetric disc bulge and superimposed right ?subarticular protrusion. Right lateral recess narrowing without ?central spinal canal stenosis. No neural foraminal stenosis. ?  ?L5-S1: Disc space narrowing with small bulge and endplate spurring. ?No spinal canal stenosis. Unchanged moderate bilateral neural ?foraminal stenosis. ?  ?Visualized sacrum: Normal. ?  ?IMPRESSION: ?1. Unchanged moderate bilateral L5-S1 neural foraminal stenosis. ?2. Unchanged right L4-5 lateral recess stenosis. ?3. Unchanged left L3-4 lateral recess stenosis. ?  ?  ?Electronically Signed ?  By: Ulyses Jarred M.D. ?  On: 05/19/2021 03:01  ? ? ? ?Objective:  VS:  HT:    WT:   BMI:     BP:106/68  HR:89bpm  TEMP: ( )  RESP:  ?Physical Exam ?Vitals and nursing note reviewed.  ?Constitutional:   ?   General: She is not in acute distress. ?   Appearance: Normal appearance. She is not  ill-appearing.  ?HENT:  ?   Head: Normocephalic and atraumatic.  ?   Right Ear: External ear normal.  ?   Left Ear: External ear normal.  ?Eyes:  ?   Extraocular Movements: Extraocular movements intact.  ?Cardiovascular:  ?   Rate and Rhythm: Normal rate.  ?   Pulses: Normal pulses.  ?Pulmonary:  ?   Effort: Pulmonary effort is normal. No respiratory distress.  ?Abdominal:  ?   General: There is no distension.  ?   Palpations: Abdomen is soft.  ?Musculoskeletal:     ?   General: Tenderness present.  ?   Cervical back: Neck supple.  ?   Right lower leg: No edema.  ?   Left lower leg: No edema.  ?   Comments: Patient has good distal strength with no pain over the greater trochanters.  No clonus or focal weakness.  ?Skin: ?   Findings: No erythema, lesion or rash.  ?Neurological:  ?   General: No focal deficit present.  ?   Mental Status: She is alert and oriented to person, place, and time.  ?   Sensory: No sensory deficit.  ?   Motor: No weakness or abnormal muscle tone.  ?   Coordination: Coordination normal.  ?Psychiatric:     ?  Mood and Affect: Mood normal.     ?   Behavior: Behavior normal.  ?  ? ?Imaging: ?No results found. ?

## 2021-06-07 NOTE — Procedures (Signed)
Lumbar Epidural Steroid Injection - Interlaminar Approach with Fluoroscopic Guidance ? ?[name]      ?Date of Birth: 1946-01-09 ?MRN: [id number] ?PCP: [name], MD      ?Visit Date: 05/27/2021 ?  ?Universal Protocol:    ? ?Consent Given By: the patient ? ?Position: PRONE ? ?Additional Comments: ?Vital signs were monitored before and after the procedure. ?Patient was prepped and draped in the usual sterile fashion. ?The correct patient, procedure, and site was verified. ? ? ?Injection Procedure Details:  ? ?Procedure diagnoses: Lumbar radiculopathy [M54.16]  ? ?Meds Administered:  ?Meds ordered this encounter  ?Medications  ? methylPREDNISolone acetate (DEPO-MEDROL) injection 80 mg  ?  ? ?Laterality: Right ? ?Location/Site:  L5-S1 ? ?Needle: 3.5 in., 20 ga. Tuohy ? ?Needle Placement: Paramedian epidural ? ?Findings:  ? -Comments: Excellent flow of contrast into the epidural space. ? ?Procedure Details: ?Using a paramedian approach from the side mentioned above, the region overlying the inferior lamina was localized under fluoroscopic visualization and the soft tissues overlying this structure were infiltrated with 4 ml. of 1% Lidocaine without Epinephrine. The Tuohy needle was inserted into the epidural space using a paramedian approach.  ? ?The epidural space was localized using loss of resistance along with counter oblique bi-planar fluoroscopic views.  After negative aspirate for air, blood, and CSF, a 2 ml. volume of Isovue-250 was injected into the epidural space and the flow of contrast was observed. Radiographs were obtained for documentation purposes.   ? ?The injectate was administered into the level noted above. ? ? ?Additional Comments:  ?The patient tolerated the procedure well ?Dressing: 2 x 2 sterile gauze and Band-Aid ?  ? ?Post-procedure details: ?Patient was observed during the procedure. ?Post-procedure instructions were reviewed. ? ?Patient left the clinic in stable  condition.  ?

## 2021-06-14 ENCOUNTER — Telehealth: Payer: Self-pay

## 2021-06-14 NOTE — Telephone Encounter (Signed)
Patient called into the office and would like to set up an apt to get an injection for Dr. Ernestina Patches  ?

## 2021-06-21 ENCOUNTER — Ambulatory Visit: Payer: PPO | Admitting: Physical Medicine and Rehabilitation

## 2021-06-21 ENCOUNTER — Encounter: Payer: Self-pay | Admitting: Physical Medicine and Rehabilitation

## 2021-06-21 VITALS — BP 102/66 | HR 101

## 2021-06-21 DIAGNOSIS — M5116 Intervertebral disc disorders with radiculopathy, lumbar region: Secondary | ICD-10-CM | POA: Diagnosis not present

## 2021-06-21 DIAGNOSIS — M48061 Spinal stenosis, lumbar region without neurogenic claudication: Secondary | ICD-10-CM | POA: Diagnosis not present

## 2021-06-21 DIAGNOSIS — M5416 Radiculopathy, lumbar region: Secondary | ICD-10-CM

## 2021-06-21 MED ORDER — PREGABALIN 50 MG PO CAPS: ORAL_CAPSULE | ORAL | 0 refills | Status: DC

## 2021-06-21 NOTE — Progress Notes (Signed)
Pt state lower back pain that travels down her right leg. Pt state laying down makes the pain worse. Pt state she takes over the counter pain meds to help ease her pain. Pt has hx of inj on 05/27/21 pt state it didn't help. ? ? ?Numeric Pain Rating Scale and Functional Assessment ?Average Pain 8 ?Pain Right Now 5 ?My pain is intermittent, sharp, dull, and aching ?Pain is worse with: walking, bending, standing, some activites, and laying down ?Pain improves with: medication ? ? ?In the last MONTH (on 0-10 scale) has pain interfered with the following? ? ?1. General activity like being  able to carry out your everyday physical activities such as walking, climbing stairs, carrying groceries, or moving a chair?  ?Rating(6) ? ?2. Relation with others like being able to carry out your usual social activities and roles such as  activities at home, at work and in your community. ?Rating(7) ? ?3. Enjoyment of life such that you have  been bothered by emotional problems such as feeling anxious, depressed or irritable?  ?Rating(8) ? ? ?

## 2021-06-21 NOTE — Progress Notes (Signed)
? ?Taylor Taylor - 76 y.o. female MRN [id number]  Date of birth: 1945/11/21 ? ?Office Visit Note: ?Visit Date: 06/21/2021 ?PCP: [name], MD ?Referred by: [name], MD ? ?Subjective: ?Chief Complaint  ?Patient presents with  ? Lower Back - Pain  ? Right Leg - Pain  ? ?HPI: Taylor Taylor is a 76 y.o. female who comes in today for evaluation of chronic bilateral lower back pain radiating down right leg, also reports intermittent pain to left leg. Patient reports pain has been ongoing for several months. Patient reports her pain is exacerbated by sleeping on right side. She describes pain as constant sore, aching and cramping sensation, currently rates as 0 out of 10. Patient reports some relief of pain with home exercise regimen, rest and use of medications. Patient states she did attend formal physical therapy many years ago and reports these treatments did help to alleviate pain. Patients recent lumbar MRI exhibits lateral recess narrowing on the left at L3-L4, lateral recess narrowing on the right at L4-L5 and moderate biforaminal narrowing at L5-S1. No high grade spinal canal stenosis noted. Patient recently had right L5-S1 interlaminar epidural steroid injection performed in our office on 05/27/2021 and reports complete resolution of right sided radicular pain when sleeping. Patient states she is now sleeping without significant pain and is able to be more active. Patient denies focal weakness, numbness and tingling. Patient denies recent trauma or falls.  ? ? ? ?Review of Systems  ?Musculoskeletal:  Positive for back pain.  ?Neurological:  Negative for tingling, sensory change, focal weakness and weakness.  ?All other systems reviewed and are negative. Otherwise per HPI. ? ?Assessment & Plan: ?Visit Diagnoses:  ?  ICD-10-CM   ?1. Lumbar radiculopathy  M54.16   ?  ?2. Radiculopathy due to lumbar intervertebral disc disorder  M51.16   ?  ?3. Stenosis of lateral recess of lumbar spine   M48.061   ?  ?   ?Plan: Findings:  ?Chronic, worsening and severe bilateral lower back pain radiating down right leg, there is also intermittent radiation to left leg. Patient continues to have severe pain despite good conservative therapies such as physical therapy, home exercise regimen, rest and use of medications. Historically, patients clinical presentation and exam have fit with classic L5 nerve pattern, however at this time her pain is more non dermatomal as it radiates to entirety of both legs. Patient did get significant relief of right leg radicular symptoms with recent right L5-S1 epidural steroid injection. I did discuss treatment plan with patient in detail today, at this time we do not feel repeating lumbar epidural steroid injection would be beneficial to patient. I also discussed medication management with patient and placed a prescription for Lyrica today, patient instructed to start with 50 mg at night for 1 week, then will titrate up to twice a day as tolerated. I would like to see patient back in approximately 4 weeks for re-evaluation. She is agreeable with plan and has no further questions at this time. No red flag symptoms noted upon exam today.   ? ?Meds & Orders:  ?Meds ordered this encounter  ?Medications  ? DISCONTD: pregabalin (LYRICA) 50 MG capsule  ?  Sig: Take 1 tablet by mouth at night for 5-7 nights, then take 1 tablet in the morning and 1 tablet at night.  ?  Dispense:  60 capsule  ?  Refill:  0  ?  Order Specific Question:   Supervising Provider  ?  AnswerMagnus Sinning [[id number]]  ? pregabalin (LYRICA) 50 MG capsule  ?  Sig: Take 1 tablet by mouth at night for 5-7 nights, then take 1 tablet in the morning and 1 tablet at night.  ?  Dispense:  60 capsule  ?  Refill:  0  ?  Order Specific Question:   Supervising Provider  ?  AnswerMagnus Sinning [[id number]]  ? No orders of the defined types were placed in this encounter. ?  ?Follow-up: Return for 4 week follow up for  re-evaluation.  ? ?Procedures: ?No procedures performed  ?   ? ?Clinical History: ?MRI LUMBAR SPINE WITHOUT CONTRAST ?  ?TECHNIQUE: ?Multiplanar, multisequence MR imaging of the lumbar spine was ?performed. No intravenous contrast was administered. ?  ?COMPARISON:  11/07/2017 ?  ?FINDINGS: ?Segmentation:  Standard. ?  ?Alignment:  Physiologic. ?  ?Vertebrae:  No fracture, evidence of discitis, or bone lesion. ?  ?Conus medullaris and cauda equina: Conus extends to the L1 level. ?Conus and cauda equina appear normal. ?  ?Paraspinal and other soft tissues: Negative. ?  ?Disc levels: ?  ?L1-L2: Normal disc space and facet joints. No spinal canal stenosis. ?No neural foraminal stenosis. ?  ?L2-L3: Progression of small disc bulge with facet hypertrophy. No ?spinal canal stenosis. No neural foraminal stenosis. ?  ?L3-L4: Unchanged left asymmetric disc bulge and mild facet ?hypertrophy. Left lateral recess narrowing without central spinal ?canal stenosis. No neural foraminal stenosis. ?  ?L4-L5: Unchanged right asymmetric disc bulge and superimposed right ?subarticular protrusion. Right lateral recess narrowing without ?central spinal canal stenosis. No neural foraminal stenosis. ?  ?L5-S1: Disc space narrowing with small bulge and endplate spurring. ?No spinal canal stenosis. Unchanged moderate bilateral neural ?foraminal stenosis. ?  ?Visualized sacrum: Normal. ?  ?IMPRESSION: ?1. Unchanged moderate bilateral L5-S1 neural foraminal stenosis. ?2. Unchanged right L4-5 lateral recess stenosis. ?3. Unchanged left L3-4 lateral recess stenosis. ?  ?  ?Electronically Signed ?  By: Ulyses Jarred M.D. ?  On: 05/19/2021 03:01  ? ?She reports that she has never smoked. She has never used smokeless tobacco.  ?Recent Labs  ?  07/20/20 ?0821  ?HGBA1C 5.4  ? ? ?Objective:  VS:  HT:    WT:   BMI:     BP:102/66  HR: ?(!) 101bpm  TEMP: ( )  RESP:  ?Physical Exam ?Vitals and nursing note reviewed.  ?HENT:  ?   Head: Normocephalic and  atraumatic.  ?   Right Ear: External ear normal.  ?   Left Ear: External ear normal.  ?   Nose: Nose normal.  ?   Mouth/Throat:  ?   Mouth: Mucous membranes are moist.  ?Eyes:  ?   Extraocular Movements: Extraocular movements intact.  ?Cardiovascular:  ?   Rate and Rhythm: Normal rate.  ?   Pulses: Normal pulses.  ?Pulmonary:  ?   Effort: Pulmonary effort is normal.  ?Abdominal:  ?   General: Abdomen is flat. There is no distension.  ?Musculoskeletal:     ?   General: Tenderness present.  ?   Cervical back: Normal range of motion.  ?   Comments: Pt is slow to rise from seated position to standing. Good lumbar range of motion. Strong distal strength without clonus, no pain upon palpation of greater trochanters. Sensation intact bilaterally. Dysesthesias noted to right L5 dermatome. Walks independently, gait steady.  ?Skin: ?   General: Skin is warm and dry.  ?   Capillary Refill:  Capillary refill takes less than 2 seconds.  ?Neurological:  ?   General: No focal deficit present.  ?   Mental Status: She is alert and oriented to person, place, and time.  ?Psychiatric:     ?   Mood and Affect: Mood normal.     ?   Behavior: Behavior normal.  ?  ?Ortho Exam ? ?Imaging: ?No results found. ? ?Past Medical/Family/Surgical/Social History: ?Medications & Allergies reviewed per EMR, new medications updated. ?Patient Active Problem List  ? Diagnosis Date Noted  ? Pain in right elbow 10/15/2020  ? Pain in right hip 04/30/2019  ? Neck pain on right side 12/25/2018  ? Pain in right shoulder 12/11/2018  ? Pain in right hand 05/18/2018  ? Pain in left hand 05/18/2018  ? Unilateral primary osteoarthritis, right knee 08/24/2016  ? Hypertriglyceridemia without hypercholesterolemia 09/03/2013  ? CHEST PAIN-PRECORDIAL 08/31/2009  ? CHEST PAIN, ATYPICAL 08/28/2009  ? ?Past Medical History:  ?Diagnosis Date  ? Allergy   ? Seasonal  ? Arthritis   ? back  ? GERD (gastroesophageal reflux disease)   ? in past  ? ?Family History  ?Problem  Relation Age of Onset  ? Parkinson's disease Mother   ? Brain cancer Father   ? Breast cancer Neg Hx   ? ?Past Surgical History:  ?Procedure Laterality Date  ? CERVICAL DISC SURGERY    ? over 10 years  ? COLONOSCOPY    ?

## 2021-06-24 ENCOUNTER — Ambulatory Visit: Payer: PPO | Admitting: Family Medicine

## 2021-06-24 ENCOUNTER — Encounter: Payer: Self-pay | Admitting: Physical Medicine and Rehabilitation

## 2021-07-02 ENCOUNTER — Ambulatory Visit: Payer: PPO | Admitting: Family Medicine

## 2021-07-06 ENCOUNTER — Ambulatory Visit: Payer: PPO | Admitting: Family Medicine

## 2021-07-07 ENCOUNTER — Other Ambulatory Visit: Payer: Self-pay | Admitting: Physical Medicine and Rehabilitation

## 2021-07-07 DIAGNOSIS — M5416 Radiculopathy, lumbar region: Secondary | ICD-10-CM

## 2021-07-07 NOTE — Progress Notes (Signed)
Referral placed to Dr. Sherley Bounds at [facility].  ?

## 2021-07-12 ENCOUNTER — Other Ambulatory Visit: Payer: Self-pay | Admitting: Physical Medicine and Rehabilitation

## 2021-07-19 ENCOUNTER — Ambulatory Visit: Payer: PPO | Admitting: Physical Medicine and Rehabilitation

## 2021-07-23 ENCOUNTER — Encounter: Payer: Self-pay | Admitting: Physical Medicine and Rehabilitation

## 2021-07-23 DIAGNOSIS — M5416 Radiculopathy, lumbar region: Secondary | ICD-10-CM | POA: Diagnosis not present

## 2021-07-27 ENCOUNTER — Ambulatory Visit (INDEPENDENT_AMBULATORY_CARE_PROVIDER_SITE_OTHER): Payer: PPO | Admitting: Family Medicine

## 2021-07-27 VITALS — BP 118/70 | HR 77 | Temp 98.2°F | Ht 63.0 in | Wt 125.0 lb

## 2021-07-27 DIAGNOSIS — R1011 Right upper quadrant pain: Secondary | ICD-10-CM | POA: Diagnosis not present

## 2021-07-27 DIAGNOSIS — R5382 Chronic fatigue, unspecified: Secondary | ICD-10-CM | POA: Diagnosis not present

## 2021-07-27 DIAGNOSIS — R1031 Right lower quadrant pain: Secondary | ICD-10-CM

## 2021-07-27 MED ORDER — DIAZEPAM 5 MG PO TABS: 5.0000 mg | ORAL_TABLET | Freq: Every evening | ORAL | 1 refills | Status: DC | PRN

## 2021-07-27 NOTE — Progress Notes (Signed)
? ?Subjective:  ? ? Patient ID: Taylor Taylor, female    DOB: 11/27/1945, 76 y.o.   MRN: [id number] ? ?HPI  ? ?Over the last year or so, have seen the patient multiple times with fatigue.  Today, CBC, CMP, TSH, B12 levels involving normal.  Her colonoscopy and mammograms are up-to-date.  There has not been an explanation for her fatigue.  Now she is dealing with severe low back pain.  She has spinal stenosis and her neurosurgeon is planning surgery.  She is not sleeping well due to the pain in her back.  This could be contributing to her fatigue however the fatigue predated the trouble with her back.  She is also worried a tremendous amount about her sister who has failing health.  Therefore there is definitely stress in her life however she does not consider herself depressed.  She still sees GYN ?Marland Kitchen  Therefore the question is whether she continued to have severe fatigue.  She denies any fevers or chills or weight loss.  She does have some nausea.  She is having some diarrhea.  She also has right lower quadrant abdominal pain.  She states the pain is intense and has been getting worse over the last week or so.  She denies any vaginal bleeding.  She denies any hematuria.  She denies any melena or hematochezia. ?Past Medical History:  ?Diagnosis Date  ? Allergy   ? Seasonal  ? Arthritis   ? back  ? GERD (gastroesophageal reflux disease)   ? in past  ? ?Past Surgical History:  ?Procedure Laterality Date  ? CERVICAL DISC SURGERY    ? over 10 years  ? COLONOSCOPY    ? ?Current Outpatient Medications on File Prior to Visit  ?Medication Sig Dispense Refill  ? acetaminophen (TYLENOL) 500 MG tablet Take 500 mg by mouth 2 (two) times daily.    ? alendronate (FOSAMAX) 70 MG tablet Take 1 tablet (70 mg total) by mouth every 7 (seven) days. Take with a full glass of water on an empty stomach. 12 tablet 3  ? Calcium Carbonate-Vit D-Min (CALCIUM 1200 PO) Take 1 tablet by mouth in the morning and at bedtime.    ?  cholecalciferol (VITAMIN D3) 25 MCG (1000 UNIT) tablet Take 125 mcg by mouth daily.    ? diclofenac (VOLTAREN) 75 MG EC tablet Take 1 tablet (75 mg total) by mouth 2 (two) times daily. 90 tablet 2  ? estradiol (ESTRACE) 1 MG tablet Take 1 tablet (1 mg total) by mouth daily. 90 tablet 3  ? ferrous sulfate 325 (65 FE) MG tablet Take 325 mg by mouth daily with breakfast.    ? Fish Oil-Cholecalciferol (FISH OIL + D3) 1200-1000 MG-UNIT CAPS Take 1 tablet by mouth daily.    ? Glucosamine-Chondroit-Vit C-Mn (GLUCOSAMINE CHONDR 1500 COMPLX) CAPS Take 1 capsule by mouth 2 (two) times daily.    ? Melatonin 10 MG CAPS Take 10 mg by mouth at bedtime as needed (sleep).     ? Multiple Vitamins-Minerals (WOMENS MULTI VITAMIN & MINERAL PO) Take 1 tablet by mouth daily.    ? progesterone (PROMETRIUM) 100 MG capsule Take 1 capsule (100 mg total) by mouth daily. 90 capsule 3  ? Red Yeast Rice 600 MG CAPS Take 600 mg by mouth daily.     ? Turmeric 500 MG TABS Take 500 mg by mouth daily.     ? venlafaxine XR (EFFEXOR XR) 150 MG 24 hr capsule Take 1 capsule (150 mg  total) by mouth daily with breakfast. 90 capsule 2  ? vitamin B-12 (CYANOCOBALAMIN) 100 MCG tablet Take 100 mcg by mouth daily.    ? vitamin E 100 UNIT capsule Take 100 Units by mouth daily.    ? pregabalin (LYRICA) 50 MG capsule Take 1 tablet by mouth at night for 5-7 nights, then take 1 tablet in the morning and 1 tablet at night. (Patient not taking: Reported on 07/27/2021) 60 capsule 0  ? traMADol (ULTRAM) 50 MG tablet Take 1 tablet (50 mg total) by mouth every 8 (eight) hours as needed for moderate pain or severe pain. (Patient not taking: Reported on 07/27/2021) 20 tablet 0  ? ?Current Facility-Administered Medications on File Prior to Visit  ?Medication Dose Route Frequency Provider Last Rate Last Admin  ? 0.9 %  sodium chloride infusion  500 mL Intravenous Continuous Pyrtle, Lajuan Lines, MD      ? ?Allergies  ?Allergen Reactions  ? Pollen Extract Itching  ?  sneezing  ? ?Social  History  ? ?Socioeconomic History  ? Marital status: Married  ?  Spouse name: Not on file  ? Number of children: Not on file  ? Years of education: Not on file  ? Highest education level: Not on file  ?Occupational History  ? Not on file  ?Tobacco Use  ? Smoking status: Never  ? Smokeless tobacco: Never  ?Vaping Use  ? Vaping Use: Never used  ?Substance and Sexual Activity  ? Alcohol use: No  ? Drug use: No  ? Sexual activity: Not on file  ?Other Topics Concern  ? Not on file  ?Social History Narrative  ? Not on file  ? ?Social Determinants of Health  ? ?Financial Resource Strain: Not on file  ?Food Insecurity: Not on file  ?Transportation Needs: Not on file  ?Physical Activity: Not on file  ?Stress: Not on file  ?Social Connections: Not on file  ?Intimate Partner Violence: Not on file  ? ? ?Past Medical History:  ?Diagnosis Date  ? Allergy   ? Seasonal  ? Arthritis   ? back  ? GERD (gastroesophageal reflux disease)   ? in past  ? ?Past Surgical History:  ?Procedure Laterality Date  ? CERVICAL DISC SURGERY    ? over 10 years  ? COLONOSCOPY    ? ?Current Outpatient Medications on File Prior to Visit  ?Medication Sig Dispense Refill  ? acetaminophen (TYLENOL) 500 MG tablet Take 500 mg by mouth 2 (two) times daily.    ? alendronate (FOSAMAX) 70 MG tablet Take 1 tablet (70 mg total) by mouth every 7 (seven) days. Take with a full glass of water on an empty stomach. 12 tablet 3  ? Calcium Carbonate-Vit D-Min (CALCIUM 1200 PO) Take 1 tablet by mouth in the morning and at bedtime.    ? cholecalciferol (VITAMIN D3) 25 MCG (1000 UNIT) tablet Take 125 mcg by mouth daily.    ? diclofenac (VOLTAREN) 75 MG EC tablet Take 1 tablet (75 mg total) by mouth 2 (two) times daily. 90 tablet 2  ? estradiol (ESTRACE) 1 MG tablet Take 1 tablet (1 mg total) by mouth daily. 90 tablet 3  ? ferrous sulfate 325 (65 FE) MG tablet Take 325 mg by mouth daily with breakfast.    ? Fish Oil-Cholecalciferol (FISH OIL + D3) 1200-1000 MG-UNIT CAPS Take  1 tablet by mouth daily.    ? Glucosamine-Chondroit-Vit C-Mn (GLUCOSAMINE CHONDR 1500 COMPLX) CAPS Take 1 capsule by mouth 2 (two) times daily.    ?  Melatonin 10 MG CAPS Take 10 mg by mouth at bedtime as needed (sleep).     ? Multiple Vitamins-Minerals (WOMENS MULTI VITAMIN & MINERAL PO) Take 1 tablet by mouth daily.    ? progesterone (PROMETRIUM) 100 MG capsule Take 1 capsule (100 mg total) by mouth daily. 90 capsule 3  ? Red Yeast Rice 600 MG CAPS Take 600 mg by mouth daily.     ? Turmeric 500 MG TABS Take 500 mg by mouth daily.     ? venlafaxine XR (EFFEXOR XR) 150 MG 24 hr capsule Take 1 capsule (150 mg total) by mouth daily with breakfast. 90 capsule 2  ? vitamin B-12 (CYANOCOBALAMIN) 100 MCG tablet Take 100 mcg by mouth daily.    ? vitamin E 100 UNIT capsule Take 100 Units by mouth daily.    ? pregabalin (LYRICA) 50 MG capsule Take 1 tablet by mouth at night for 5-7 nights, then take 1 tablet in the morning and 1 tablet at night. (Patient not taking: Reported on 07/27/2021) 60 capsule 0  ? traMADol (ULTRAM) 50 MG tablet Take 1 tablet (50 mg total) by mouth every 8 (eight) hours as needed for moderate pain or severe pain. (Patient not taking: Reported on 07/27/2021) 20 tablet 0  ? ?Current Facility-Administered Medications on File Prior to Visit  ?Medication Dose Route Frequency Provider Last Rate Last Admin  ? 0.9 %  sodium chloride infusion  500 mL Intravenous Continuous Pyrtle, Lajuan Lines, MD      ? ?Allergies  ?Allergen Reactions  ? Pollen Extract Itching  ?  sneezing  ? ?Social History  ? ?Socioeconomic History  ? Marital status: Married  ?  Spouse name: Not on file  ? Number of children: Not on file  ? Years of education: Not on file  ? Highest education level: Not on file  ?Occupational History  ? Not on file  ?Tobacco Use  ? Smoking status: Never  ? Smokeless tobacco: Never  ?Vaping Use  ? Vaping Use: Never used  ?Substance and Sexual Activity  ? Alcohol use: No  ? Drug use: No  ? Sexual activity: Not on file   ?Other Topics Concern  ? Not on file  ?Social History Narrative  ? Not on file  ? ?Social Determinants of Health  ? ?Financial Resource Strain: Not on file  ?Food Insecurity: Not on file  ?Transportation Needs: Not

## 2021-07-28 LAB — COMPLETE METABOLIC PANEL WITH GFR
AG Ratio: 1.6 (calc) (ref 1.0–2.5)
ALT: 15 U/L (ref 6–29)
AST: 15 U/L (ref 10–35)
Albumin: 4.4 g/dL (ref 3.6–5.1)
Alkaline phosphatase (APISO): 37 U/L (ref 37–153)
BUN: 18 mg/dL (ref 7–25)
CO2: 27 mmol/L (ref 20–32)
Calcium: 9.9 mg/dL (ref 8.6–10.4)
Chloride: 105 mmol/L (ref 98–110)
Creat: 0.9 mg/dL (ref 0.60–1.00)
Globulin: 2.7 g/dL (calc) (ref 1.9–3.7)
Glucose, Bld: 104 mg/dL — ABNORMAL HIGH (ref 65–99)
Potassium: 4.9 mmol/L (ref 3.5–5.3)
Sodium: 140 mmol/L (ref 135–146)
Total Bilirubin: 0.4 mg/dL (ref 0.2–1.2)
Total Protein: 7.1 g/dL (ref 6.1–8.1)
eGFR: 67 mL/min/{1.73_m2} (ref >=60)

## 2021-07-28 LAB — CBC WITH DIFFERENTIAL/PLATELET
Absolute Monocytes: 493 cells/uL (ref 200–950)
Basophils Absolute: 39 cells/uL (ref 0–200)
Basophils Relative: 0.7 %
Eosinophils Absolute: 202 cells/uL (ref 15–500)
Eosinophils Relative: 3.6 %
HCT: 42.6 % (ref 35.0–45.0)
Hemoglobin: 14 g/dL (ref 11.7–15.5)
Lymphs Abs: 1826 cells/uL (ref 850–3900)
MCH: 31 pg (ref 27.0–33.0)
MCHC: 32.9 g/dL (ref 32.0–36.0)
MCV: 94.5 fL (ref 80.0–100.0)
MPV: 10 fL (ref 7.5–12.5)
Monocytes Relative: 8.8 %
Neutro Abs: 3041 cells/uL (ref 1500–7800)
Neutrophils Relative %: 54.3 %
Platelets: 317 10*3/uL (ref 140–400)
RBC: 4.51 10*6/uL (ref 3.80–5.10)
RDW: 12.1 % (ref 11.0–15.0)
Total Lymphocyte: 32.6 %
WBC: 5.6 10*3/uL (ref 3.8–10.8)

## 2021-07-28 LAB — SEDIMENTATION RATE: Sed Rate: 2 mm/h (ref 0–30)

## 2021-07-29 ENCOUNTER — Ambulatory Visit: Payer: PPO

## 2021-07-30 ENCOUNTER — Other Ambulatory Visit: Payer: Self-pay | Admitting: Family Medicine

## 2021-07-30 DIAGNOSIS — H2511 Age-related nuclear cataract, right eye: Secondary | ICD-10-CM | POA: Diagnosis not present

## 2021-07-30 DIAGNOSIS — Z961 Presence of intraocular lens: Secondary | ICD-10-CM | POA: Diagnosis not present

## 2021-07-30 MED ORDER — MODAFINIL 200 MG PO TABS: 200.0000 mg | ORAL_TABLET | Freq: Every day | ORAL | 2 refills | Status: DC

## 2021-08-02 ENCOUNTER — Telehealth: Payer: Self-pay

## 2021-08-02 NOTE — Telephone Encounter (Signed)
Taylor Taylor Key: [id number] - PA Case ID: [id number] - Rx #: [id number] ?Modafinil  ? ?PA send to plan ?

## 2021-08-03 ENCOUNTER — Ambulatory Visit
Admission: RE | Admit: 2021-08-03 | Discharge: 2021-08-03 | Disposition: A | Discharge status: Home / Self Care | Payer: PPO | Source: Ambulatory Visit | Attending: Family Medicine | Admitting: Family Medicine

## 2021-08-03 DIAGNOSIS — K5730 Diverticulosis of large intestine without perforation or abscess without bleeding: Secondary | ICD-10-CM | POA: Diagnosis not present

## 2021-08-03 DIAGNOSIS — R1031 Right lower quadrant pain: Secondary | ICD-10-CM

## 2021-08-03 DIAGNOSIS — D259 Leiomyoma of uterus, unspecified: Secondary | ICD-10-CM | POA: Diagnosis not present

## 2021-08-03 MED ORDER — IOPAMIDOL (ISOVUE-300) INJECTION 61%
100.0000 mL | Freq: Once | INTRAVENOUS | Status: AC | PRN
  Administered 2021-08-03: 100 mL via INTRAVENOUS

## 2021-08-04 NOTE — Telephone Encounter (Signed)
Denied on May 15 ? ?Your Prior Authorization request has been denied.  ? ?The reason we denied your request is because prior authorization criteria was not met, and diagnosis submitted is not approved or medically accepted.  ? ?The requested drug must meet the following therapeutic criteria: ? ?Obstructive sleep anpea (OSA): DX  OSA defined by one of the following 15 or more obstructive respiratory events per hour of sleep confirmed by a sleep study ? ?

## 2021-08-05 ENCOUNTER — Ambulatory Visit (INDEPENDENT_AMBULATORY_CARE_PROVIDER_SITE_OTHER): Payer: PPO

## 2021-08-05 VITALS — Ht 63.0 in | Wt 125.0 lb

## 2021-08-05 DIAGNOSIS — Z0000 Encounter for general adult medical examination without abnormal findings: Secondary | ICD-10-CM | POA: Diagnosis not present

## 2021-08-05 NOTE — Progress Notes (Signed)
Subjective:   Taylor Taylor is a 76 y.o. female who presents for Medicare Annual (Subsequent) preventive examination. Virtual Visit via Telephone Note  I connected with  Taylor Taylor on 08/05/21 at 10:30 AM EDT by telephone and verified that I am speaking with the correct person using two identifiers.  Location: Patient: HOME Provider: [facility] Persons participating in the virtual visit: patient/Nurse Health Advisor   I discussed the limitations, risks, security and privacy concerns of performing an evaluation and management service by telephone and the availability of in person appointments. The patient expressed understanding and agreed to proceed.  Interactive audio and video telecommunications were attempted between this nurse and patient, however failed, due to patient having technical difficulties OR patient did not have access to video capability.  We continued and completed visit with audio only.  Some vital signs may be absent or patient reported.   Chriss Driver, LPN  Review of Systems     Cardiac Risk Factors include: advanced age (>23mn, >>66women);dyslipidemia;sedentary lifestyle;Other (see comment), Risk factor comments: Pinched nerve in back, Osteoarthritis.     Objective:    Today's Vitals   08/05/21 1035 08/05/21 1036  Weight: 125 lb (56.7 kg)   Height: '5\' 3"'$  (1.6 m)   PainSc:  5    Body mass index is 22.14 kg/m.     08/05/2021   10:41 AM 07/23/2020    9:57 AM 12/24/2018    6:40 AM  Advanced Directives  Does Patient Have a Medical Advance Directive? Yes Yes Yes  Type of AP[place]amed[place]of AA[place]Living will [place] will [place] will  Does patient want to make changes to medical advance directive?  No - Patient declined   Copy of [place] Chart? No - copy requested No - copy requested     Current Medications (verified) Outpatient  Encounter Medications as of 08/05/2021  Medication Sig   acetaminophen (TYLENOL) 500 MG tablet Take 500 mg by mouth 2 (two) times daily.   alendronate (FOSAMAX) 70 MG tablet Take 1 tablet (70 mg total) by mouth every 7 (seven) days. Take with a full glass of water on an empty stomach.   Calcium Carbonate-Vit D-Min (CALCIUM 1200 PO) Take 1 tablet by mouth in the morning and at bedtime.   cholecalciferol (VITAMIN D3) 25 MCG (1000 UNIT) tablet Take 125 mcg by mouth daily.   diazepam (VALIUM) 5 MG tablet Take 1 tablet (5 mg total) by mouth at bedtime as needed for anxiety.   diclofenac (VOLTAREN) 75 MG EC tablet Take 1 tablet (75 mg total) by mouth 2 (two) times daily.   estradiol (ESTRACE) 1 MG tablet Take 1 tablet (1 mg total) by mouth daily.   ferrous sulfate 325 (65 FE) MG tablet Take 325 mg by mouth daily with breakfast.   Fish Oil-Cholecalciferol (FISH OIL + D3) 1200-1000 MG-UNIT CAPS Take 1 tablet by mouth daily.   Glucosamine-Chondroit-Vit C-Mn (GLUCOSAMINE CHONDR 1500 COMPLX) CAPS Take 1 capsule by mouth 2 (two) times daily.   Melatonin 10 MG CAPS Take 10 mg by mouth at bedtime as needed (sleep).    modafinil (PROVIGIL) 200 MG tablet Take 1 tablet (200 mg total) by mouth daily.   Multiple Vitamins-Minerals (WOMENS MULTI VITAMIN & MINERAL PO) Take 1 tablet by mouth daily.   progesterone (PROMETRIUM) 100 MG capsule Take 1 capsule (100 mg total) by mouth daily.   Red Yeast Rice 600 MG CAPS  Take 600 mg by mouth daily.    traMADol (ULTRAM) 50 MG tablet Take 50-100 mg by mouth 4 (four) times daily as needed.   Turmeric 500 MG TABS Take 500 mg by mouth daily.    venlafaxine XR (EFFEXOR XR) 150 MG 24 hr capsule Take 1 capsule (150 mg total) by mouth daily with breakfast.   vitamin B-12 (CYANOCOBALAMIN) 100 MCG tablet Take 100 mcg by mouth daily.   vitamin E 100 UNIT capsule Take 100 Units by mouth daily.   Facility-Administered Encounter Medications as of 08/05/2021  Medication   0.9 %  sodium  chloride infusion    Allergies (verified) Pollen extract   History: Past Medical History:  Diagnosis Date   Allergy    Seasonal   Arthritis    back   GERD (gastroesophageal reflux disease)    in past   Past Surgical History:  Procedure Laterality Date   BACK SURGERY     Scheduled for 08/19/2021., Dr, Elyn Peers.   CERVICAL DISC SURGERY     over 10 years   COLONOSCOPY     Family History  Problem Relation Age of Onset   Parkinson's disease Mother    Brain cancer Father    Breast cancer Neg Hx    Social History   Socioeconomic History   Marital status: Married    Spouse name: Not on file   Number of children: Not on file   Years of education: Not on file   Highest education level: Not on file  Occupational History   Not on file  Tobacco Use   Smoking status: Never   Smokeless tobacco: Never  Vaping Use   Vaping Use: Never used  Substance and Sexual Activity   Alcohol use: No   Drug use: No   Sexual activity: Not on file  Other Topics Concern   Not on file  Social History Narrative   Not on file   Social Determinants of Health   Financial Resource Strain: Low Risk    Difficulty of Paying Living Expenses: Not hard at all  Food Insecurity: No Food Insecurity   Worried About Charity fundraiser in the Last Year: Never true   [place] in the Last Year: Never true  Transportation Needs: No Transportation Needs   Lack of Transportation (Medical): No   Lack of Transportation (Non-Medical): No  Physical Activity: Sufficiently Active   Days of Exercise per Week: 4 days   Minutes of Exercise per Session: 40 min  Stress: No Stress Concern Present   Feeling of Stress : Not at all  Social Connections: Socially Integrated   Frequency of Communication with Friends and Family: More than three times a week   Frequency of Social Gatherings with Friends and Family: More than three times a week   Attends Religious Services: More than 4 times per year   Active Member  of [employer] or Organizations: Yes   Attends Music therapist: More than 4 times per year   Marital Status: Married    Tobacco Counseling Counseling given: Not Answered   Clinical Intake:  Pre-visit preparation completed: Yes  Pain : 0-10 Pain Score: 5  Pain Type: Chronic pain Pain Location: Back Pain Descriptors / Indicators: Aching, Dull Pain Onset: More than a month ago Pain Frequency: Intermittent Pain Relieving Factors: Having back surgery June 1st with Dr. Elyn Peers.  Pain Relieving Factors: Having back surgery June 1st with Dr. Elyn Peers.  BMI - recorded: 22.14 Nutritional Status: BMI  of 19-24  Normal Nutritional Risks: Other (Comment) Diabetes: No  How often do you need to have someone help you when you read instructions, pamphlets, or other written materials from your doctor or pharmacy?: 1 - Never  Diabetic?NO  Interpreter Needed?: No  Information entered by :: mj Birney Belshe, lpn   Activities of Daily Living    08/05/2021   10:43 AM  In your present state of health, do you have any difficulty performing the following activities:  Hearing? 0  Vision? 0  Difficulty concentrating or making decisions? 0  Walking or climbing stairs? 0  Dressing or bathing? 0  Doing errands, shopping? 0  Preparing Food and eating ? N  Using the Toilet? N  In the past six months, have you accidently leaked urine? Y  Do you have problems with loss of bowel control? N  Managing your Medications? N  Managing your Finances? N  Housekeeping or managing your Housekeeping? N    Patient Care Team: [name], MD as PCP - General (Family Medicine) Edythe Clarity, [facility] as Pharmacist (Pharmacist)  Indicate any recent Medical Services you may have received from other than [facility] providers in the past year (date may be approximate).     Assessment:   This is a routine wellness examination for Taylor Taylor.  Hearing/Vision screen Hearing Screening - Comments:: No hearing  issues.  Vision Screening - Comments:: Glasses. Dr. Valetta Close. 08/03/2021.  Dietary issues and exercise activities discussed: Current Exercise Habits: Home exercise routine, Type of exercise: walking, Time (Minutes): 40, Frequency (Times/Week): 4, Weekly Exercise (Minutes/Week): 160, Intensity: Mild, Exercise limited by: cardiac condition(s);orthopedic condition(s)   Goals Addressed             This Visit's Progress    Exercise 3x per week (30 min per time)       Continue to exercise, eat well and stay healthy.        Depression Screen    08/05/2021   10:39 AM 07/23/2020    9:59 AM 07/15/2019    3:00 PM 02/21/2018   11:38 AM 05/25/2017   11:18 AM 05/25/2017   10:41 AM 09/03/2013   12:18 PM  PHQ 2/9 Scores  PHQ - 2 Score 0 0 0 0 0 0 0  PHQ- 9 Score    4       Fall Risk    08/05/2021   10:42 AM 07/23/2020    9:59 AM 07/15/2019    3:00 PM 02/21/2018   11:38 AM 05/25/2017   11:18 AM  Fall Risk   Falls in the past year? 0 0 0 0 No  Number falls in past yr: 0 0  0   Injury with Fall? 0 0  0   Risk for fall due to : Impaired balance/gait No Fall Risks     Follow up Falls prevention discussed Falls evaluation completed;Falls prevention discussed Falls evaluation completed Falls evaluation completed     FALL RISK PREVENTION PERTAINING TO THE HOME:  Any stairs in or around the home? Yes  If so, are there any without handrails? No  Home free of loose throw rugs in walkways, pet beds, electrical cords, etc? Yes  Adequate lighting in your home to reduce risk of falls? Yes   ASSISTIVE DEVICES UTILIZED TO PREVENT FALLS:  Life alert? No  Use of a cane, walker or w/c? No  Grab bars in the bathroom? No  Shower chair or bench in shower? Yes  Elevated toilet seat or a handicapped  toilet? No   TIMED UP AND GO:  Was the test performed? No .  Phone visit.  Cognitive Function:        08/05/2021   10:43 AM  6CIT Screen  What Year? 0 points  What month? 0 points  What time? 0 points   Count back from 20 0 points  Months in reverse 4 points  Repeat phrase 0 points  Total Score 4 points    Immunizations Immunization History  Administered Date(s) Administered   Fluad Quad(high Dose 65+) 12/06/2018   Hepatitis A 12/10/1997, 09/17/1998   Hepatitis A, Ped/Adol-2 Dose 12/10/1997, 09/17/1998   Hepatitis B 12/10/1997, 01/09/1998, 08/11/1998   Hepatitis B, ped/adol 12/10/1997, 01/09/1998, 08/11/1998   Influenza Inj Mdck Quad With Preservative 11/19/2016, 11/20/2019   Influenza Split 01/18/2012   Influenza, High Dose Seasonal PF 12/22/2015, 10/27/2016, 12/15/2017, 12/06/2018   Influenza,inj,Quad PF,6+ Mos 01/01/2013, 01/07/2014, 12/30/2014   Influenza-Unspecified 12/20/2014, 10/27/2016, 12/23/2020   PFIZER(Purple Top)SARS-COV-2 Vaccination 04/12/2019, 05/02/2019, 07/20/2020   Pfizer Covid-19 Vaccine Bivalent Booster 5yr & up 12/23/2020   Pneumococcal Conjugate-13 07/17/2014   Pneumococcal Polysaccharide-23 07/12/2011   Td 12/09/1997   Tdap 07/12/2011   Zoster Recombinat (Shingrix) 08/12/2016, 12/24/2016   Zoster, Live 02/26/2010    TDAP status: Due, Education has been provided regarding the importance of this vaccine. Advised may receive this vaccine at local pharmacy or Health Dept. Aware to provide a copy of the vaccination record if obtained from local pharmacy or Health Dept. Verbalized acceptance and understanding.  Flu Vaccine status: Up to date  Pneumococcal vaccine status: Up to date  Covid-19 vaccine status: Completed vaccines  Qualifies for Shingles Vaccine? Yes   Zostavax completed Yes   Shingrix Completed?: Yes  Screening Tests Health Maintenance  Topic Date Due   TETANUS/TDAP  12/17/2021 (Originally 07/11/2021)   MAMMOGRAM  10/14/2021   INFLUENZA VACCINE  10/19/2021   Pneumonia Vaccine 75 Years old  Completed   DEXA SCAN  Completed   COVID-19 Vaccine  Completed   Hepatitis C Screening  Completed   Zoster Vaccines- Shingrix  Completed   HPV  VACCINES  Aged Out    Health Maintenance  There are no preventive care reminders to display for this patient.   Colorectal cancer screening: No longer required.   Mammogram status: Completed 10/14/2020. Repeat every year  Bone Density status: Completed 10/28/2020. Results reflect: Bone density results: OSTEOPENIA. Repeat every 2 years.  Lung Cancer Screening: (Low Dose CT Chest recommended if Age 75-80years, 30 pack-year currently smoking OR have quit w/in 15years.) does not qualify.    Additional Screening:  Hepatitis C Screening: does qualify; Completed 11/12/2015  Vision Screening: Recommended annual ophthalmology exams for early detection of glaucoma and other disorders of the eye. Is the patient up to date with their annual eye exam?  Yes  Who is the provider or what is the name of the office in which the patient attends annual eye exams? Dr. BValetta CloseIf pt is not established with a provider, would they like to be referred to a provider to establish care? No .   Dental Screening: Recommended annual dental exams for proper oral hygiene  Community Resource Referral / Chronic Care Management: CRR required this visit?  No   CCM required this visit?  No      Plan:     I have personally reviewed and noted the following in the patient's chart:   Medical and social history Use of alcohol, tobacco or illicit drugs  Current medications  and supplements including opioid prescriptions.  Functional ability and status Nutritional status Physical activity Advanced directives List of other physicians Hospitalizations, surgeries, and ER visits in previous 12 months Vitals Screenings to include cognitive, depression, and falls Referrals and appointments  In addition, I have reviewed and discussed with patient certain preventive protocols, quality metrics, and best practice recommendations. A written personalized care plan for preventive services as well as general preventive health  recommendations were provided to patient.     Chriss Driver, LPN   [id number]   Nurse Notes: Discussed Tdap and how to obtain.

## 2021-08-05 NOTE — Patient Instructions (Signed)
Taylor Taylor , Thank you for taking time to come for your Medicare Wellness Visit. I appreciate your ongoing commitment to your health goals. Please review the following plan we discussed and let me know if I can assist you in the future.   Screening recommendations/referrals: Colonoscopy: Done 06/07/2016. No follow up required. Mammogram: Done 10/14/2020 Repeat annually  Bone Density: Done 10/28/2020 Repeat every 2 years  Recommended yearly ophthalmology/optometry visit for glaucoma screening and checkup Recommended yearly dental visit for hygiene and checkup  Vaccinations: Influenza vaccine: Done 12/23/2020 Repeat annually  Pneumococcal vaccine: Done 07/17/2014 and 07/12/2011. Tdap vaccine: Done 07/12/2011 Repeat in 10 years  Shingles vaccine: Done 02/26/2010, 08/12/2016 and 12/24/2016.   Covid-19:Done 04/12/2019, 05/02/2019 and 07/20/2020  Advanced directives: Please bring a copy of your health care power of attorney and living will to the office to be added to your chart at your convenience.   Conditions/risks identified: KEEP UP THE GOOD WORK!!  Next appointment: Follow up in one year for your annual wellness visit 2024.   Preventive Care 73 Years and Older, Female Preventive care refers to lifestyle choices and visits with your health care provider that can promote health and wellness. What does preventive care include? A yearly physical exam. This is also called an annual well check. Dental exams once or twice a year. Routine eye exams. Ask your health care provider how often you should have your eyes checked. Personal lifestyle choices, including: Daily care of your teeth and gums. Regular physical activity. Eating a healthy diet. Avoiding tobacco and drug use. Limiting alcohol use. Practicing safe sex. Taking low-dose aspirin every day. Taking vitamin and mineral supplements as recommended by your health care provider. What happens during an annual well check? The services and  screenings done by your health care provider during your annual well check will depend on your age, overall health, lifestyle risk factors, and family history of disease. Counseling  Your health care provider may ask you questions about your: Alcohol use. Tobacco use. Drug use. Emotional well-being. Home and relationship well-being. Sexual activity. Eating habits. History of falls. Memory and ability to understand (cognition). Work and work Statistician. Reproductive health. Screening  You may have the following tests or measurements: Height, weight, and BMI. Blood pressure. Lipid and cholesterol levels. These may be checked every 5 years, or more frequently if you are over 72 years old. Skin check. Lung cancer screening. You may have this screening every year starting at age 4 if you have a 30-pack-year history of smoking and currently smoke or have quit within the past 15 years. Fecal occult blood test (FOBT) of the stool. You may have this test every year starting at age 54. Flexible sigmoidoscopy or colonoscopy. You may have a sigmoidoscopy every 5 years or a colonoscopy every 10 years starting at age 4. Hepatitis C blood test. Hepatitis B blood test. Sexually transmitted disease (STD) testing. Diabetes screening. This is done by checking your blood sugar (glucose) after you have not eaten for a while (fasting). You may have this done every 1-3 years. Bone density scan. This is done to screen for osteoporosis. You may have this done starting at age 72. Mammogram. This may be done every 1-2 years. Talk to your health care provider about how often you should have regular mammograms. Talk with your health care provider about your test results, treatment options, and if necessary, the need for more tests. Vaccines  Your health care provider may recommend certain vaccines, such as: Influenza vaccine.  This is recommended every year. Tetanus, diphtheria, and acellular pertussis (Tdap,  Td) vaccine. You may need a Td booster every 10 years. Zoster vaccine. You may need this after age 72. Pneumococcal 13-valent conjugate (PCV13) vaccine. One dose is recommended after age 11. Pneumococcal polysaccharide (PPSV23) vaccine. One dose is recommended after age 67. Talk to your health care provider about which screenings and vaccines you need and how often you need them. This information is not intended to replace advice given to you by your health care provider. Make sure you discuss any questions you have with your health care provider. Document Released: 04/03/2015 Document Revised: 11/25/2015 Document Reviewed: 01/06/2015 Elsevier Interactive Patient Education  2017 [place] Prevention in the Home Falls can cause injuries. They can happen to people of all ages. There are many things you can do to make your home safe and to help prevent falls. What can I do on the outside of my home? Regularly fix the edges of walkways and driveways and fix any cracks. Remove anything that might make you trip as you walk through a door, such as a raised step or threshold. Trim any bushes or trees on the path to your home. Use bright outdoor lighting. Clear any walking paths of anything that might make someone trip, such as rocks or tools. Regularly check to see if handrails are loose or broken. Make sure that both sides of any steps have handrails. Any raised decks and porches should have guardrails on the edges. Have any leaves, snow, or ice cleared regularly. Use sand or salt on walking paths during winter. Clean up any spills in your garage right away. This includes oil or grease spills. What can I do in the bathroom? Use night lights. Install grab bars by the toilet and in the tub and shower. Do not use towel bars as grab bars. Use non-skid mats or decals in the tub or shower. If you need to sit down in the shower, use a plastic, non-slip stool. Keep the floor dry. Clean up any  water that spills on the floor as soon as it happens. Remove soap buildup in the tub or shower regularly. Attach bath mats securely with double-sided non-slip rug tape. Do not have throw rugs and other things on the floor that can make you trip. What can I do in the bedroom? Use night lights. Make sure that you have a light by your bed that is easy to reach. Do not use any sheets or blankets that are too big for your bed. They should not hang down onto the floor. Have a firm chair that has side arms. You can use this for support while you get dressed. Do not have throw rugs and other things on the floor that can make you trip. What can I do in the kitchen? Clean up any spills right away. Avoid walking on wet floors. Keep items that you use a lot in easy-to-reach places. If you need to reach something above you, use a strong step stool that has a grab bar. Keep electrical cords out of the way. Do not use floor polish or wax that makes floors slippery. If you must use wax, use non-skid floor wax. Do not have throw rugs and other things on the floor that can make you trip. What can I do with my stairs? Do not leave any items on the stairs. Make sure that there are handrails on both sides of the stairs and use them. Fix handrails  that are broken or loose. Make sure that handrails are as long as the stairways. Check any carpeting to make sure that it is firmly attached to the stairs. Fix any carpet that is loose or worn. Avoid having throw rugs at the top or bottom of the stairs. If you do have throw rugs, attach them to the floor with carpet tape. Make sure that you have a light switch at the top of the stairs and the bottom of the stairs. If you do not have them, ask someone to add them for you. What else can I do to help prevent falls? Wear shoes that: Do not have high heels. Have rubber bottoms. Are comfortable and fit you well. Are closed at the toe. Do not wear sandals. If you use a  stepladder: Make sure that it is fully opened. Do not climb a closed stepladder. Make sure that both sides of the stepladder are locked into place. Ask someone to hold it for you, if possible. Clearly mark and make sure that you can see: Any grab bars or handrails. First and last steps. Where the edge of each step is. Use tools that help you move around (mobility aids) if they are needed. These include: Canes. Walkers. Scooters. Crutches. Turn on the lights when you go into a dark area. Replace any light bulbs as soon as they burn out. Set up your furniture so you have a clear path. Avoid moving your furniture around. If any of your floors are uneven, fix them. If there are any pets around you, be aware of where they are. Review your medicines with your doctor. Some medicines can make you feel dizzy. This can increase your chance of falling. Ask your doctor what other things that you can do to help prevent falls. This information is not intended to replace advice given to you by your health care provider. Make sure you discuss any questions you have with your health care provider. Document Released: 01/01/2009 Document Revised: 08/13/2015 Document Reviewed: 04/11/2014 Elsevier Interactive Patient Education  2017 [employer].

## 2021-08-11 ENCOUNTER — Telehealth: Payer: Self-pay | Admitting: Pharmacist

## 2021-08-11 NOTE — Progress Notes (Signed)
Chronic Care Management Pharmacy Assistant   Name: Taylor Taylor  MRN: [id number] DOB: [birth year]   Reason for Encounter: Medication Coordination Call    Recent office visits:  07/27/2021 OV (PCP) [name], MD;  I will try the patient on Valium 5 mg p.o. nightly to see if the insomnia could be contributing to her fatigue.  If none of this is helping I would recommend trying Provigil for chronic fatigue syndrome.  Recent consult visits:  07/23/2021 [facility] ([facility]) [place]; no further information available.  06/21/2021 OV (Orthopedics) [place] Pall E, NP; no medication changes indicated.  Hospital visits:  None in previous 6 months  Medications: Outpatient Encounter Medications as of 08/11/2021  Medication Sig   acetaminophen (TYLENOL) 500 MG tablet Take 500 mg by mouth 2 (two) times daily.   alendronate (FOSAMAX) 70 MG tablet Take 1 tablet (70 mg total) by mouth every 7 (seven) days. Take with a full glass of water on an empty stomach.   Calcium Carbonate-Vit D-Min (CALCIUM 1200 PO) Take 1 tablet by mouth in the morning and at bedtime.   cholecalciferol (VITAMIN D3) 25 MCG (1000 UNIT) tablet Take 125 mcg by mouth daily.   diazepam (VALIUM) 5 MG tablet Take 1 tablet (5 mg total) by mouth at bedtime as needed for anxiety.   diclofenac (VOLTAREN) 75 MG EC tablet Take 1 tablet (75 mg total) by mouth 2 (two) times daily.   estradiol (ESTRACE) 1 MG tablet Take 1 tablet (1 mg total) by mouth daily.   ferrous sulfate 325 (65 FE) MG tablet Take 325 mg by mouth daily with breakfast.   Fish Oil-Cholecalciferol (FISH OIL + D3) 1200-1000 MG-UNIT CAPS Take 1 tablet by mouth daily.   Glucosamine-Chondroit-Vit C-Mn (GLUCOSAMINE CHONDR 1500 COMPLX) CAPS Take 1 capsule by mouth 2 (two) times daily.   Melatonin 10 MG CAPS Take 10 mg by mouth at bedtime as needed (sleep).    modafinil (PROVIGIL) 200 MG tablet Take 1 tablet (200 mg total) by  mouth daily.   Multiple Vitamins-Minerals (WOMENS MULTI VITAMIN & MINERAL PO) Take 1 tablet by mouth daily.   progesterone (PROMETRIUM) 100 MG capsule Take 1 capsule (100 mg total) by mouth daily.   Red Yeast Rice 600 MG CAPS Take 600 mg by mouth daily.    traMADol (ULTRAM) 50 MG tablet Take 50-100 mg by mouth 4 (four) times daily as needed.   Turmeric 500 MG TABS Take 500 mg by mouth daily.    venlafaxine XR (EFFEXOR XR) 150 MG 24 hr capsule Take 1 capsule (150 mg total) by mouth daily with breakfast.   vitamin B-12 (CYANOCOBALAMIN) 100 MCG tablet Take 100 mcg by mouth daily.   vitamin E 100 UNIT capsule Take 100 Units by mouth daily.   Facility-Administered Encounter Medications as of 08/11/2021  Medication   0.9 %  sodium chloride infusion   Reviewed chart for medication changes ahead of medication coordination call.  BP Readings from Last 3 Encounters:  07/27/21 118/70  06/21/21 102/66  05/27/21 106/68    Lab Results  Component Value Date   HGBA1C 5.4 07/20/2020     Patient obtains medications through Vials  90 Days   Last adherence delivery included:  Alendronate 70 mg once a week Diclofenac Sodium 75 mg twice a day Venlafaxine ER 150 mg once a day Estradiol 1 mg once a day Progesterone 100 mg once a day  Patient is due for next adherence delivery  on: 08/24/2021. Called patient and reviewed medications and coordinated delivery.  This delivery to include: Alendronate 70 mg once a week Diclofenac Sodium 75 mg twice a day Venlafaxine ER 150 mg once a day Estradiol 1 mg once a day Progesterone 100 mg once a day  Coordinated acute fill for (med) to be delivered (date).  Patient declined the following medications: Diazepam - patient states this medication makes her "too sleepy".  Patient needs refills for: Diclofenac sodium 75 mg twice a day.  Confirmed delivery date of 08/24/2021, advised patient that pharmacy will contact them the morning of delivery.  Care  Gaps: Medicare Annual Wellness: Completed 08/05/2021 Hemoglobin A1C: 5.4% on 07/20/2020 Colonoscopy: Last completed 1/12008 Dexa Scan: Completed Mammogram: Next due on 10/14/2021  Future Appointments  Date Time Provider [place]  08/11/2022 10:30 AM [facility] HEALTH ADVISOR [id number]   April D Calhoun, [place] Pharmacist Assistant [phone]

## 2021-08-12 ENCOUNTER — Other Ambulatory Visit: Payer: Self-pay | Admitting: *Deleted

## 2021-08-12 MED ORDER — DICLOFENAC SODIUM 75 MG PO TBEC: 75.0000 mg | DELAYED_RELEASE_TABLET | Freq: Two times a day (BID) | ORAL | 2 refills | Status: DC

## 2021-08-12 NOTE — Telephone Encounter (Signed)
Requested Prescriptions  Pending Prescriptions Disp Refills  . diclofenac (VOLTAREN) 75 MG EC tablet 90 tablet 2    Sig: Take 1 tablet (75 mg total) by mouth 2 (two) times daily.     Analgesics:  NSAIDS Failed - 08/12/2021  1:39 PM      Failed - Manual Review: Labs are only required if the patient has taken medication for more than 8 weeks.      Passed - Cr in normal range and within 360 days    Creat  Date Value Ref Range Status  07/27/2021 0.90 0.60 - 1.00 mg/dL Final         Passed - HGB in normal range and within 360 days    Hemoglobin  Date Value Ref Range Status  07/27/2021 14.0 11.7 - 15.5 g/dL Final   Hemoglobin, fingerstick  Date Value Ref Range Status  11/18/2014 12.0 12.0 - 16.0 g/dL Final         Passed - PLT in normal range and within 360 days    Platelets  Date Value Ref Range Status  07/27/2021 317 140 - 400 Thousand/uL Final         Passed - HCT in normal range and within 360 days    HCT  Date Value Ref Range Status  07/27/2021 42.6 35.0 - 45.0 % Final         Passed - eGFR is 30 or above and within 360 days    GFR, Est African American  Date Value Ref Range Status  07/20/2020 83 > OR = 60 mL/min/1.54m Final   GFR, Est Non African American  Date Value Ref Range Status  07/20/2020 72 > OR = 60 mL/min/1.727mFinal   eGFR  Date Value Ref Range Status  07/27/2021 67 > OR = 60 mL/min/1.7376minal    Comment:    The eGFR is based on the CKD-EPI 2021 equation. To calculate  the new eGFR from a previous Creatinine or Cystatin C result, go to https://www.kidney.org/professionals/ kdoqi/gfr%5Fcalculator          Passed - Patient is not pregnant      Passed - Valid encounter within last 12 months    Recent Outpatient Visits          2 weeks ago Right lower quadrant abdominal pain   [facility]   3 months ago Viral gastroenteritis   [facility]   3 months ago Viral  gastroenteritis   [facility]   9 months ago Osteopenia, unspecified location   [facility]   1 year ago Chronic fatigue   [facility] [place]

## 2021-08-12 NOTE — Telephone Encounter (Signed)
-----   Message from April D Calhoun sent at 08/12/2021 10:43 AM EDT ----- Regarding: Rx refill request Patient has an upcoming delivery with [facility], she will need refills on Diclofenac 75 mg twice daily to complete her order. Please send refills to [facility].  Thank you,  April D Calhoun, [place] Pharmacist Assistant [phone]

## 2021-08-19 DIAGNOSIS — M48061 Spinal stenosis, lumbar region without neurogenic claudication: Secondary | ICD-10-CM | POA: Diagnosis not present

## 2021-08-19 DIAGNOSIS — M5416 Radiculopathy, lumbar region: Secondary | ICD-10-CM | POA: Diagnosis not present

## 2021-09-06 ENCOUNTER — Other Ambulatory Visit: Payer: Self-pay | Admitting: Family Medicine

## 2021-09-06 DIAGNOSIS — Z1231 Encounter for screening mammogram for malignant neoplasm of breast: Secondary | ICD-10-CM

## 2021-09-10 DIAGNOSIS — M5416 Radiculopathy, lumbar region: Secondary | ICD-10-CM | POA: Diagnosis not present

## 2021-10-14 ENCOUNTER — Encounter: Payer: Self-pay | Admitting: Physical Medicine and Rehabilitation

## 2021-10-15 ENCOUNTER — Ambulatory Visit: Payer: PPO

## 2021-10-19 ENCOUNTER — Telehealth: Payer: Self-pay | Admitting: Physical Medicine and Rehabilitation

## 2021-10-19 NOTE — Telephone Encounter (Signed)
Patient called. She would like Megan to call her back. [phone]

## 2021-11-03 ENCOUNTER — Ambulatory Visit
Admission: RE | Admit: 2021-11-03 | Discharge: 2021-11-03 | Disposition: A | Discharge status: Home / Self Care | Payer: PPO | Source: Ambulatory Visit | Attending: Family Medicine | Admitting: Family Medicine

## 2021-11-03 DIAGNOSIS — Z1231 Encounter for screening mammogram for malignant neoplasm of breast: Secondary | ICD-10-CM

## 2021-11-10 DIAGNOSIS — R5383 Other fatigue: Secondary | ICD-10-CM | POA: Insufficient documentation

## 2021-11-10 HISTORY — DX: Other fatigue: R53.83

## 2021-11-11 ENCOUNTER — Telehealth: Payer: Self-pay | Admitting: Pharmacist

## 2021-11-11 ENCOUNTER — Other Ambulatory Visit: Payer: Self-pay

## 2021-11-11 NOTE — Telephone Encounter (Signed)
Requested medication (s) are due for refill today: yes  Requested medication (s) are on the active medication list: yes  Last refill:  all have different dates  Future visit scheduled: no  Notes to clinic:  some medications need manually rewiewed per NT protocol and effexor needs updated labs, please advise for refills.      Requested Prescriptions  Pending Prescriptions Disp Refills   alendronate (FOSAMAX) 70 MG tablet 12 tablet 3    Sig: Take 1 tablet (70 mg total) by mouth every 7 (seven) days. Take with a full glass of water on an empty stomach.     Endocrinology:  Bisphosphonates Failed - 11/11/2021 10:17 AM      Failed - Vitamin D in normal range and within 360 days    Vit D, 25-Hydroxy  Date Value Ref Range Status  11/12/2015 63 30 - 100 ng/mL Final    Comment:    Vitamin D Status           25-OH Vitamin D        Deficiency                <20 ng/mL        Insufficiency         20 - 29 ng/mL        Optimal             > or = 30 ng/mL   For 25-OH Vitamin D testing on patients on D2-supplementation and patients for whom quantitation of D2 and D3 fractions is required, the [facility] 25-OH VIT D, (D2,D3), LC/MS/MS is recommended: order code 85814 (patients > 2 yrs).          Failed - Mg Level in normal range and within 360 days    No results found for: "MG"       Failed - Phosphate in normal range and within 360 days    No results found for: "PHOS"       Passed - Ca in normal range and within 360 days    Calcium  Date Value Ref Range Status  07/27/2021 9.9 8.6 - 10.4 mg/dL Final         Passed - Cr in normal range and within 360 days    Creat  Date Value Ref Range Status  07/27/2021 0.90 0.60 - 1.00 mg/dL Final         Passed - eGFR is 30 or above and within 360 days    GFR, Est African American  Date Value Ref Range Status  07/20/2020 83 > OR = 60 mL/min/1.73m2 Final   GFR, Est Non African American  Date Value Ref Range Status  07/20/2020 72 > OR = 60  mL/min/1.73m2 Final   eGFR  Date Value Ref Range Status  07/27/2021 67 > OR = 60 mL/min/1.73m2 Final    Comment:    The eGFR is based on the CKD-EPI 2021 equation. To calculate  the new eGFR from a previous Creatinine or Cystatin C result, go to https://www.kidney.org/professionals/ kdoqi/gfr%5Fcalculator          Passed - Valid encounter within last 12 months    Recent Outpatient Visits           3 months ago Right lower quadrant abdominal pain   [facility] Pickard, Warren T, MD   6 months ago Viral gastroenteritis   [facility] Pickard, Warren T, MD   6 months ago Viral gastroenteritis   [facility] Pick[facility] Warren T, MD   1 year ago Osteopenia, unspecified location   [facility] Pickard, Warren T, MD   1 year ago Chronic fatigue   [facility] Pickard, Warren T, MD              Passed - Bone Mineral Density or Dexa Scan completed in the last 2 years       diclofenac (VOLTAREN) 75 MG EC tablet 90 tablet 2    Sig: Take 1 tablet (75 mg total) by mouth 2 (two) times daily.     Analgesics:  NSAIDS Failed - 11/11/2021 10:17 AM      Failed - Manual Review: Labs are only required if the patient has taken medication for more than 8 weeks.      Passed - Cr in normal range and within 360 days    Creat  Date Value Ref Range Status  07/27/2021 0.90 0.60 - 1.00 mg/dL Final         Passed - HGB in normal range and within 360 days    Hemoglobin  Date Value Ref Range Status  07/27/2021 14.0 11.7 - 15.5 g/dL Final   Hemoglobin, fingerstick  Date Value Ref Range Status  11/18/2014 12.0 12.0 - 16.0 g/dL Final         Passed - PLT in normal range and within 360 days    Platelets  Date Value Ref Range Status  07/27/2021 317 140 - 400 Thousand/uL Final         Passed - HCT in normal range and within 360 days    HCT  Date Value Ref Range Status  07/27/2021 42.6 35.0 - 45.0 % Final          Passed - eGFR is 30 or above and within 360 days    GFR, Est African American  Date Value Ref Range Status  07/20/2020 83 > OR = 60 mL/min/1.73m2 Final   GFR, Est Non African American  Date Value Ref Range Status  07/20/2020 72 > OR = 60 mL/min/1.73m2 Final   eGFR  Date Value Ref Range Status  07/27/2021 67 > OR = 60 mL/min/1.73m2 Final    Comment:    The eGFR is based on the CKD-EPI 2021 equation. To calculate  the new eGFR from a previous Creatinine or Cystatin C result, go to https://www.kidney.org/professionals/ kdoqi/gfr%5Fcalculator          Passed - Patient is not pregnant      Passed - Valid encounter within last 12 months    Recent Outpatient Visits           3 months ago Right lower quadrant abdominal pain   [facility] Pickard, Warren T, MD   6 months ago Viral gastroenteritis   [facility] Pickard, Warren T, MD   6 months ago Viral gastroenteritis   [facility] Pickard, Warren T, MD   1 year ago Osteopenia, unspecified location   [facility] Pickard, Warren T, MD   1 year ago Chronic fatigue   [facility] Pickard, Warren T, MD               modafinil (PROVIGIL) 200 MG tablet 30 tablet 2    Sig: Take 1 tablet (200 mg total) by mouth daily.     Off-Protocol Failed - 11/11/2021 10:17 AM      Failed - Medication not assigned to a protocol,   review manually.      Passed - Valid encounter within last 12 months    Recent Outpatient Visits           3 months ago Right lower quadrant abdominal pain   [place] [facility] Schaumann, Cammie Mcgee, MD   6 months ago Viral gastroenteritis   [place] [facility] Schaumann, Cammie Mcgee, MD   6 months ago Viral gastroenteritis   [place] [facility] Schaumann, Cammie Mcgee, MD   1 year ago Osteopenia, unspecified location   [place] Pickard, Cammie Mcgee, MD   1 year ago Chronic fatigue   [place] Dennard Schaumann, Cammie Mcgee, MD               venlafaxine XR (EFFEXOR XR) 150 MG 24 hr capsule 90 capsule 2    Sig: Take 1 capsule (150 mg total) by mouth daily with breakfast.     Psychiatry: Antidepressants - SNRI - desvenlafaxine & venlafaxine Failed - 11/11/2021 10:17 AM      Failed - Lipid Panel in normal range within the last 12 months    Cholesterol  Date Value Ref Range Status  07/20/2020 193 <200 mg/dL Final   LDL Cholesterol (Calc)  Date Value Ref Range Status  07/20/2020 94 mg/dL (calc) Final    Comment:    Reference range: <100 . Desirable range <100 mg/dL for primary prevention;   <70 mg/dL for patients with CHD or diabetic patients  with > or = 2 CHD risk factors. Marland Kitchen LDL-C is now calculated using the Martin-Hopkins  calculation, which is a validated novel method providing  better accuracy than the Friedewald equation in the  estimation of LDL-C.  Cresenciano Genre et al. Annamaria Helling. 0175;102(58): 2061-2068  (http://education.Quest[facility].com/faq/FAQ164)    HDL  Date Value Ref Range Status  07/20/2020 84 > OR = 50 mg/dL Final   Triglycerides  Date Value Ref Range Status  07/20/2020 60 <150 mg/dL Final         Passed - Cr in normal range and within 360 days    Creat  Date Value Ref Range Status  07/27/2021 0.90 0.60 - 1.00 mg/dL Final         Passed - Last BP in normal range    BP Readings from Last 1 Encounters:  07/27/21 118/70         Passed - Valid encounter within last 6 months    Recent Outpatient Visits           3 months ago Right lower quadrant abdominal pain   [place] [name], MD   6 months ago Viral gastroenteritis   [place] [facility] Schaumann, Cammie Mcgee, MD   6 months ago Viral gastroenteritis   [place] Denn[facility] Schaumann, Cammie Mcgee, MD   1 year ago Osteopenia, unspecified location   [place], Cammie Mcgee, MD   1 year ago Chronic fatigue   [place] Pickard, Cammie Mcgee, MD

## 2021-11-11 NOTE — Telephone Encounter (Signed)
-----   Message from April D Calhoun sent at 11/11/2021  9:46 AM EDT ----- Regarding: Rx refill request Patient has an upcoming delivery with [facility]. She will need refills on the following medications to complete her order:  Alendronate 70 mg once a week DiclofenacSodium75 mg twice a day Venlafaxine ER 150 mg once a day Modafanil 20 mg once daily  Please send refills to [facility].  Thank you,  April D Calhoun, [place] Pharmacist Assistant [phone]

## 2021-11-11 NOTE — Progress Notes (Signed)
Chronic Care Management Pharmacy Assistant   Name: Taylor Taylor  MRN: [id number] DOB: December 24, 1945   Reason for Encounter: Medication Coordination Call    Recent office visits:  None since last medication coordination call  Recent consult visits:  None since last medication coordination call  Hospital visits:  None in previous 6 months  Medications: Outpatient Encounter Medications as of 11/11/2021  Medication Sig   acetaminophen (TYLENOL) 500 MG tablet Take 500 mg by mouth 2 (two) times daily.   alendronate (FOSAMAX) 70 MG tablet Take 1 tablet (70 mg total) by mouth every 7 (seven) days. Take with a full glass of water on an empty stomach.   Calcium Carbonate-Vit D-Min (CALCIUM 1200 PO) Take 1 tablet by mouth in the morning and at bedtime.   cholecalciferol (VITAMIN D3) 25 MCG (1000 UNIT) tablet Take 125 mcg by mouth daily.   diazepam (VALIUM) 5 MG tablet Take 1 tablet (5 mg total) by mouth at bedtime as needed for anxiety.   diclofenac (VOLTAREN) 75 MG EC tablet Take 1 tablet (75 mg total) by mouth 2 (two) times daily.   estradiol (ESTRACE) 1 MG tablet Take 1 tablet (1 mg total) by mouth daily.   ferrous sulfate 325 (65 FE) MG tablet Take 325 mg by mouth daily with breakfast.   Fish Oil-Cholecalciferol (FISH OIL + D3) 1200-1000 MG-UNIT CAPS Take 1 tablet by mouth daily.   Glucosamine-Chondroit-Vit C-Mn (GLUCOSAMINE CHONDR 1500 COMPLX) CAPS Take 1 capsule by mouth 2 (two) times daily.   Melatonin 10 MG CAPS Take 10 mg by mouth at bedtime as needed (sleep).    modafinil (PROVIGIL) 200 MG tablet Take 1 tablet (200 mg total) by mouth daily.   Multiple Vitamins-Minerals (WOMENS MULTI VITAMIN & MINERAL PO) Take 1 tablet by mouth daily.   progesterone (PROMETRIUM) 100 MG capsule Take 1 capsule (100 mg total) by mouth daily.   Red Yeast Rice 600 MG CAPS Take 600 mg by mouth daily.    traMADol (ULTRAM) 50 MG tablet Take 50-100 mg by mouth 4 (four) times daily as needed.    Turmeric 500 MG TABS Take 500 mg by mouth daily.    venlafaxine XR (EFFEXOR XR) 150 MG 24 hr capsule Take 1 capsule (150 mg total) by mouth daily with breakfast.   vitamin B-12 (CYANOCOBALAMIN) 100 MCG tablet Take 100 mcg by mouth daily.   vitamin E 100 UNIT capsule Take 100 Units by mouth daily.   Facility-Administered Encounter Medications as of 11/11/2021  Medication   0.9 %  sodium chloride infusion   Reviewed chart for medication changes ahead of medication coordination call.  BP Readings from Last 3 Encounters:  07/27/21 118/70  06/21/21 102/66  05/27/21 106/68    Lab Results  Component Value Date   HGBA1C 5.4 07/20/2020     Patient obtains medications through Vials  90 Days   Last adherence delivery included:  Alendronate 70 mg once a week Diclofenac Sodium 75 mg twice a day Venlafaxine ER 150 mg once a day Estradiol 1 mg once a day Progesterone 100 mg once a day  Patient is due for next adherence delivery on: 11/22/2021. Called patient and reviewed medications and coordinated delivery.  This delivery to include: Alendronate 70 mg once a week Diclofenac Sodium 75 mg twice a day Venlafaxine ER 150 mg once a day Estradiol 1 mg once a day Progesterone 100 mg once a day Modafanil 20 mg once daily Gabapentin 300 mg twice daily  Patient needs  refills for: Alendronate 70 mg once a week Diclofenac Sodium 75 mg twice a day Venlafaxine ER 150 mg once a day Modafanil 20 mg once daily  Confirmed delivery date of 11/22/2021, advised patient that pharmacy will contact them the morning of delivery.  Care Gaps: Medicare Annual Wellness: Completed 08/05/2021 Hemoglobin A1C: 5.4% on 07/20/2020 Colonoscopy: Last completed 1/12008 Dexa Scan: Completed Mammogram: Next due on 10/14/2021  Future Appointments  Date Time Provider [place]  08/11/2022 10:30 AM [facility] HEALTH ADVISOR [id number]   April D Calhoun, [place] Pharmacist Assistant [phone]

## 2021-11-16 ENCOUNTER — Other Ambulatory Visit: Payer: Self-pay | Admitting: Family Medicine

## 2021-11-17 ENCOUNTER — Other Ambulatory Visit: Payer: Self-pay | Admitting: Family Medicine

## 2021-11-17 MED ORDER — MODAFINIL 200 MG PO TABS: 200.0000 mg | ORAL_TABLET | Freq: Every day | ORAL | 2 refills | Status: DC

## 2021-11-17 NOTE — Telephone Encounter (Signed)
-----   Message from Edythe Clarity, [facility] sent at 11/17/2021  4:29 PM EDT ----- Taylor Taylor!  [facility] is reaching out requesting 90 days escribed on modafanil.  The current rx in her chart says print.  Let me know if it was faxed I will tell them to look out for it!  Beverly Milch, PharmD, CPP Clinical Pharmacist Practitioner [place] [phone]

## 2021-11-17 NOTE — Telephone Encounter (Signed)
Resending as provider ordered due to not at pharmacy.   Requested Prescriptions  Pending Prescriptions Disp Refills  . modafinil (PROVIGIL) 200 MG tablet 30 tablet 2    Sig: Take 1 tablet (200 mg total) by mouth daily.     Off-Protocol Failed - 11/17/2021  4:47 PM      Failed - Medication not assigned to a protocol, review manually.      Passed - Valid encounter within last 12 months    Recent Outpatient Visits          3 months ago Right lower quadrant abdominal pain   [place] [facility] Scha[facility], Cammie Mcgee, MD   6 months ago Viral gastroenteritis   [place] [facility] Schaumann, Cammie Mcgee, MD   6 months ago Viral gastroenteritis   [place] Pickard, Cammie Mcgee, MD   1 year ago Osteopenia, unspecified location   [place] Pickard, Cammie Mcgee, MD   1 year ago Chronic fatigue   [place] Pickard, Cammie Mcgee, MD      Future Appointments            In 2 days Pickard, Cammie Mcgee, MD [place]

## 2021-11-17 NOTE — Addendum Note (Signed)
Addended by: Matilde Sprang on: 11/17/2021 04:51 PM   Modules accepted: Orders

## 2021-11-18 ENCOUNTER — Ambulatory Visit (INDEPENDENT_AMBULATORY_CARE_PROVIDER_SITE_OTHER): Payer: PPO | Admitting: Family Medicine

## 2021-11-18 ENCOUNTER — Encounter: Payer: Self-pay | Admitting: Family Medicine

## 2021-11-18 VITALS — BP 112/62 | HR 71 | Temp 97.7°F | Ht 63.0 in | Wt 122.4 lb

## 2021-11-18 DIAGNOSIS — R5383 Other fatigue: Secondary | ICD-10-CM

## 2021-11-18 NOTE — Progress Notes (Signed)
Subjective:    Patient ID: Taylor Taylor, female    DOB: [birth year], 76 y.o.   MRN: [id number]  HPI  07/2021 Over the last year or so, have seen the patient multiple times with fatigue.  Today, CBC, CMP, TSH, B12 levels involving normal.  Her colonoscopy and mammograms are up-to-date.  There has not been an explanation for her fatigue.  Now she is dealing with severe low back pain.  She has spinal stenosis and her neurosurgeon is planning surgery.  She is not sleeping well due to the pain in her back.  This could be contributing to her fatigue however the fatigue predated the trouble with her back.  She is also worried a tremendous amount about her sister who has failing health.  Therefore there is definitely stress in her life however she does not consider herself depressed.  She still sees GYN.  Therefore the question is whether she continued to have severe fatigue.  She denies any fevers or chills or weight loss.  She does have some nausea.  She is having some diarrhea.  She also has right lower quadrant abdominal pain.  She states the pain is intense and has been getting worse over the last week or so.  She denies any vaginal bleeding.  She denies any hematuria.  She denies any melena or hematochezia.  At that time, my plan   I am concerned about the right lower quadrant abdominal pain.  I will start by obtaining CT scan given the worsening pain over the last week and the severe fatigue.  If the CT scan of the abdomen and pelvis is normal, I feel the best thorough diagnostic test to rule out any occult malignancy or infection.  I will repeat a CBC a CMP and a sedimentation rate.  If the sed rate is normal I feel that that would rule out autoimmune diseases along with normal labs.  I will try the patient on Valium 5 mg p.o. nightly to see if the insomnia could be contributing to her fatigue.  If none of this is helping I would recommend trying Provigil for chronic fatigue syndrome.  Await the  results of the CT scan and the lab work  11/18/21 Patient is here today for follow-up.  CT scan of the abdomen pelvis was unremarkable in May.  Patient continues to endorse severe fatigue despite an extensive work-up.  Labs have been unremarkable on several occasions.  CT scan was unremarkable.  Colonoscopy was negative in 2018.  Essentially the patient reports lack of energy.  We have even tried venlafaxine for possible depression and Provigil for chronic fatigue syndrome however the patient took the Provigil for over a month and saw no benefit despite taking 200 mg a day.  She continues to endorse severe fatigue.  She states that on a daily basis she has very little energy even to get off the couch or do anything.  Her weight remains relatively stable.  Her weight is down 3 pounds from May.  She denies any chest pain.  She denies any shortness of breath.  She had an echocardiogram in February that was normal.  She denies any cough or pleurisy or hemoptysis.  She denies any nausea or vomiting.  She does occasionally have diarrhea once every 2 weeks or so but this is not consistent or heavy.  She denies any joint pains or rash or muscle pains.   Past Medical History:  Diagnosis Date   Allergy  Seasonal   Arthritis    back   GERD (gastroesophageal reflux disease)    in past   Past Surgical History:  Procedure Laterality Date   BACK SURGERY     Scheduled for 08/19/2021., Dr, Elyn Peers.   CERVICAL DISC SURGERY     over 10 years   COLONOSCOPY     Current Outpatient Medications on File Prior to Visit  Medication Sig Dispense Refill   acetaminophen (TYLENOL) 500 MG tablet Take 500 mg by mouth 2 (two) times daily.     alendronate (FOSAMAX) 70 MG tablet TAKE 1 TABLET BY MOUTH ONCE WEEKLY BEFORE THE FIRST FOOD, BEVERAGE OR MEDICINE OF THE DAY WITH PLAIN WATER 12 tablet 3   Calcium Carbonate-Vit D-Min (CALCIUM 1200 PO) Take 1 tablet by mouth in the morning and at bedtime.     cholecalciferol (VITAMIN  D3) 25 MCG (1000 UNIT) tablet Take 125 mcg by mouth daily.     diclofenac (VOLTAREN) 75 MG EC tablet TAKE ONE TABLET BY MOUTH TWICE DAILY 90 tablet 2   estradiol (ESTRACE) 1 MG tablet Take 1 tablet (1 mg total) by mouth daily. 90 tablet 3   ferrous sulfate 325 (65 FE) MG tablet Take 325 mg by mouth daily with breakfast.     Fish Oil-Cholecalciferol (FISH OIL + D3) 1200-1000 MG-UNIT CAPS Take 1 tablet by mouth daily.     Melatonin 10 MG CAPS Take 10 mg by mouth at bedtime as needed (sleep).      modafinil (PROVIGIL) 200 MG tablet Take 1 tablet (200 mg total) by mouth daily. 30 tablet 2   Multiple Vitamins-Minerals (WOMENS MULTI VITAMIN & MINERAL PO) Take 1 tablet by mouth daily.     progesterone (PROMETRIUM) 100 MG capsule Take 1 capsule (100 mg total) by mouth daily. 90 capsule 3   Red Yeast Rice 600 MG CAPS Take 600 mg by mouth daily.      Turmeric 500 MG TABS Take 500 mg by mouth daily.      venlafaxine XR (EFFEXOR-XR) 150 MG 24 hr capsule TAKE ONE CAPSULE BY MOUTH EVERY MORNING 90 capsule 2   vitamin B-12 (CYANOCOBALAMIN) 100 MCG tablet Take 100 mcg by mouth daily.     vitamin E 100 UNIT capsule Take 100 Units by mouth daily.     diazepam (VALIUM) 5 MG tablet Take 1 tablet (5 mg total) by mouth at bedtime as needed for anxiety. (Patient not taking: Reported on 11/18/2021) 30 tablet 1   Glucosamine-Chondroit-Vit C-Mn (GLUCOSAMINE CHONDR 1500 COMPLX) CAPS Take 1 capsule by mouth 2 (two) times daily. (Patient not taking: Reported on 11/18/2021)     traMADol (ULTRAM) 50 MG tablet Take 50-100 mg by mouth 4 (four) times daily as needed. (Patient not taking: Reported on 11/18/2021)     Current Facility-Administered Medications on File Prior to Visit  Medication Dose Route Frequency Provider Last Rate Last Admin   0.9 %  sodium chloride infusion  500 mL Intravenous Continuous Pyrtle, Lajuan Lines, MD       Allergies  Allergen Reactions   Pollen Extract Itching    sneezing   Social History    Socioeconomic History   Marital status: Married    Spouse name: Not on file   Number of children: Not on file   Years of education: Not on file   Highest education level: Not on file  Occupational History   Not on file  Tobacco Use   Smoking status: Never   Smokeless tobacco: Never  Vaping Use   Vaping Use: Never used  Substance and Sexual Activity   Alcohol use: No   Drug use: No   Sexual activity: Not on file  Other Topics Concern   Not on file  Social History Narrative   Not on file   Social Determinants of Health   Financial Resource Strain: Low Risk  (08/05/2021)   Overall Financial Resource Strain (CARDIA)    Difficulty of Paying Living Expenses: Not hard at all  Food Insecurity: No Food Insecurity (08/05/2021)   Hunger Vital Sign    Worried About Running Out of Food in the Last Year: Never true    Ran Out of Food in the Last Year: Never true  Transportation Needs: No Transportation Needs (08/05/2021)   PRAPARE - Hydrologist (Medical): No    Lack of Transportation (Non-Medical): No  Physical Activity: Sufficiently Active (08/05/2021)   Exercise Vital Sign    Days of Exercise per Week: 4 days    Minutes of Exercise per Session: 40 min  Stress: No Stress Concern Present (08/05/2021)   [place]    Feeling of Stress : Not at all  Social Connections: [place] (08/05/2021)   Social Connection and Isolation Panel [NHANES]    Frequency of Communication with Friends and Family: More than three times a week    Frequency of Social Gatherings with Friends and Family: More than three times a week    Attends Religious Services: More than 4 times per year    Active Member of [employer] or Organizations: Yes    Attends Music therapist: More than 4 times per year    Marital Status: Married  Human resources officer Violence: Not At Risk (08/05/2021)   Humiliation, Afraid,  Rape, and Kick questionnaire    Fear of Current or Ex-Partner: No    Emotionally Abused: No    Physically Abused: No    Sexually Abused: No    Past Medical History:  Diagnosis Date   Allergy    Seasonal   Arthritis    back   GERD (gastroesophageal reflux disease)    in past   Past Surgical History:  Procedure Laterality Date   BACK SURGERY     Scheduled for 08/19/2021., Dr, Elyn Peers.   CERVICAL DISC SURGERY     over 10 years   COLONOSCOPY     Current Outpatient Medications on File Prior to Visit  Medication Sig Dispense Refill   acetaminophen (TYLENOL) 500 MG tablet Take 500 mg by mouth 2 (two) times daily.     alendronate (FOSAMAX) 70 MG tablet TAKE 1 TABLET BY MOUTH ONCE WEEKLY BEFORE THE FIRST FOOD, BEVERAGE OR MEDICINE OF THE DAY WITH PLAIN WATER 12 tablet 3   Calcium Carbonate-Vit D-Min (CALCIUM 1200 PO) Take 1 tablet by mouth in the morning and at bedtime.     cholecalciferol (VITAMIN D3) 25 MCG (1000 UNIT) tablet Take 125 mcg by mouth daily.     diclofenac (VOLTAREN) 75 MG EC tablet TAKE ONE TABLET BY MOUTH TWICE DAILY 90 tablet 2   estradiol (ESTRACE) 1 MG tablet Take 1 tablet (1 mg total) by mouth daily. 90 tablet 3   ferrous sulfate 325 (65 FE) MG tablet Take 325 mg by mouth daily with breakfast.     Fish Oil-Cholecalciferol (FISH OIL + D3) 1200-1000 MG-UNIT CAPS Take 1 tablet by mouth daily.     Melatonin 10 MG CAPS Take 10  mg by mouth at bedtime as needed (sleep).      modafinil (PROVIGIL) 200 MG tablet Take 1 tablet (200 mg total) by mouth daily. 30 tablet 2   Multiple Vitamins-Minerals (WOMENS MULTI VITAMIN & MINERAL PO) Take 1 tablet by mouth daily.     progesterone (PROMETRIUM) 100 MG capsule Take 1 capsule (100 mg total) by mouth daily. 90 capsule 3   Red Yeast Rice 600 MG CAPS Take 600 mg by mouth daily.      Turmeric 500 MG TABS Take 500 mg by mouth daily.      venlafaxine XR (EFFEXOR-XR) 150 MG 24 hr capsule TAKE ONE CAPSULE BY MOUTH EVERY MORNING 90 capsule 2    vitamin B-12 (CYANOCOBALAMIN) 100 MCG tablet Take 100 mcg by mouth daily.     vitamin E 100 UNIT capsule Take 100 Units by mouth daily.     diazepam (VALIUM) 5 MG tablet Take 1 tablet (5 mg total) by mouth at bedtime as needed for anxiety. (Patient not taking: Reported on 11/18/2021) 30 tablet 1   Glucosamine-Chondroit-Vit C-Mn (GLUCOSAMINE CHONDR 1500 COMPLX) CAPS Take 1 capsule by mouth 2 (two) times daily. (Patient not taking: Reported on 11/18/2021)     traMADol (ULTRAM) 50 MG tablet Take 50-100 mg by mouth 4 (four) times daily as needed. (Patient not taking: Reported on 11/18/2021)     Current Facility-Administered Medications on File Prior to Visit  Medication Dose Route Frequency Provider Last Rate Last Admin   0.9 %  sodium chloride infusion  500 mL Intravenous Continuous Pyrtle, Lajuan Lines, MD       Allergies  Allergen Reactions   Pollen Extract Itching    sneezing   Social History   Socioeconomic History   Marital status: Married    Spouse name: Not on file   Number of children: Not on file   Years of education: Not on file   Highest education level: Not on file  Occupational History   Not on file  Tobacco Use   Smoking status: Never   Smokeless tobacco: Never  Vaping Use   Vaping Use: Never used  Substance and Sexual Activity   Alcohol use: No   Drug use: No   Sexual activity: Not on file  Other Topics Concern   Not on file  Social History Narrative   Not on file   Social Determinants of Health   Financial Resource Strain: Low Risk  (08/05/2021)   Overall Financial Resource Strain (CARDIA)    Difficulty of Paying Living Expenses: Not hard at all  Food Insecurity: No Food Insecurity (08/05/2021)   Hunger Vital Sign    Worried About Running Out of Food in the Last Year: Never true    Ran Out of Food in the Last Year: Never true  Transportation Needs: No Transportation Needs (08/05/2021)   PRAPARE - Hydrologist (Medical): No    Lack of  Transportation (Non-Medical): No  Physical Activity: Sufficiently Active (08/05/2021)   Exercise Vital Sign    Days of Exercise per Week: 4 days    Minutes of Exercise per Session: 40 min  Stress: No Stress Concern Present (08/05/2021)   [place]    Feeling of Stress : Not at all  Social Connections: [place] (08/05/2021)   Social Connection and Isolation Panel [NHANES]    Frequency of Communication with Friends and Family: More than three times a week    Frequency  of Social Gatherings with Friends and Family: More than three times a week    Attends Religious Services: More than 4 times per year    Active Member of [employer] or Organizations: Yes    Attends Music therapist: More than 4 times per year    Marital Status: Married  Human resources officer Violence: Not At Risk (08/05/2021)   Humiliation, Afraid, Rape, and Kick questionnaire    Fear of Current or Ex-Partner: No    Emotionally Abused: No    Physically Abused: No    Sexually Abused: No      Review of Systems  All other systems reviewed and are negative.      Objective:   Physical Exam Vitals reviewed.  Constitutional:      General: She is not in acute distress.    Appearance: She is well-developed. She is not diaphoretic.  HENT:     Head: Normocephalic and atraumatic.     Right Ear: External ear normal.     Left Ear: External ear normal.     Nose: Nose normal.     Mouth/Throat:     Pharynx: No oropharyngeal exudate.  Eyes:     Conjunctiva/sclera: Conjunctivae normal.     Pupils: Pupils are equal, round, and reactive to light.  Neck:     Thyroid: No thyromegaly.     Vascular: No JVD.     Trachea: No tracheal deviation.  Cardiovascular:     Rate and Rhythm: Normal rate and regular rhythm.     Heart sounds: Normal heart sounds. No murmur heard.    No friction rub. No gallop.  Pulmonary:     Effort: Pulmonary effort is normal.  No respiratory distress.     Breath sounds: Normal breath sounds. No wheezing or rales.  Chest:     Chest wall: No tenderness.  Abdominal:     General: Abdomen is flat. Bowel sounds are normal. There is no distension.     Palpations: Abdomen is soft. There is no mass.     Tenderness: There is abdominal tenderness in the right lower quadrant. There is no guarding or rebound.  Musculoskeletal:        General: No tenderness. Normal range of motion.     Cervical back: Neck supple.  Lymphadenopathy:     Cervical: No cervical adenopathy.  Skin:    Findings: No rash.  Neurological:     Mental Status: She is alert and oriented to person, place, and time.     Cranial Nerves: No cranial nerve deficit.     Motor: No abnormal muscle tone.     Coordination: Coordination normal.     Deep Tendon Reflexes: Reflexes normal.           Assessment & Plan:  Fatigue, unspecified type - Plan: Cortisol At this point, I do not know what else could be causing her fatigue.  Her diagnostic work-up has been negative today.  Symptoms have remained relatively stable and have been present since prior to December 2021.  Therefore I do not feel that there is a serious life-threatening illness.  I have not screened the patient for adrenal insufficiency.  I will check a baseline cortisol level and will consult endocrinology to see if there is any other hormonal issues that may be causing fatigue that I may not be considering.

## 2021-11-19 ENCOUNTER — Ambulatory Visit: Payer: PPO | Admitting: Family Medicine

## 2021-11-19 LAB — CORTISOL: Cortisol, Plasma: 17.2 ug/dL

## 2021-12-03 ENCOUNTER — Other Ambulatory Visit: Payer: Self-pay

## 2021-12-03 MED ORDER — MODAFINIL 200 MG PO TABS: 200.0000 mg | ORAL_TABLET | Freq: Every day | ORAL | 2 refills | Status: DC

## 2021-12-03 NOTE — Telephone Encounter (Signed)
Spoke w/pt this afternoon regarding her referrals and medication.  Told pt that the endocrinologist called her and lvm for pt to call and make an appt with them. Pt voiced, will call them and make appt.   Ask pt if the medication she was referring to was, Provigil? Pt stated that was it. Advice pt that it was sent to [facility] on 11/17/21. Pt thought it was sent to [facility].   Pt voiced understanding and nothing further.

## 2021-12-06 ENCOUNTER — Telehealth: Payer: Self-pay | Admitting: "Endocrinology

## 2021-12-06 NOTE — Telephone Encounter (Signed)
Dr Dorris Fetch Can you look over chart? Her referral only states Fatigue. Thank you

## 2021-12-06 NOTE — Telephone Encounter (Signed)
Closed Referral until this done. Notate in Referral Note.

## 2021-12-17 DIAGNOSIS — M205X2 Other deformities of toe(s) (acquired), left foot: Secondary | ICD-10-CM | POA: Diagnosis not present

## 2021-12-17 DIAGNOSIS — I739 Peripheral vascular disease, unspecified: Secondary | ICD-10-CM | POA: Diagnosis not present

## 2021-12-17 DIAGNOSIS — M205X1 Other deformities of toe(s) (acquired), right foot: Secondary | ICD-10-CM | POA: Diagnosis not present

## 2021-12-17 DIAGNOSIS — M792 Neuralgia and neuritis, unspecified: Secondary | ICD-10-CM | POA: Diagnosis not present

## 2022-01-18 ENCOUNTER — Encounter: Payer: Self-pay | Admitting: Physical Medicine and Rehabilitation

## 2022-01-18 ENCOUNTER — Ambulatory Visit: Payer: PPO | Admitting: Physical Medicine and Rehabilitation

## 2022-01-18 VITALS — BP 136/72 | HR 78

## 2022-01-18 DIAGNOSIS — M5416 Radiculopathy, lumbar region: Secondary | ICD-10-CM

## 2022-01-18 DIAGNOSIS — M5116 Intervertebral disc disorders with radiculopathy, lumbar region: Secondary | ICD-10-CM | POA: Diagnosis not present

## 2022-01-18 DIAGNOSIS — M47816 Spondylosis without myelopathy or radiculopathy, lumbar region: Secondary | ICD-10-CM

## 2022-01-18 DIAGNOSIS — M48061 Spinal stenosis, lumbar region without neurogenic claudication: Secondary | ICD-10-CM | POA: Diagnosis not present

## 2022-01-18 NOTE — Progress Notes (Unsigned)
Taylor Taylor - 76 y.o. female MRN [id number]  Date of birth: 1945/12/31  Office Visit Note: Visit Date: 01/18/2022 PCP: [name], MD Referred by: [name], MD  Subjective: Chief Complaint  Patient presents with   Lower Back - Pain   HPI: Taylor Taylor is a 76 y.o. female who comes in today for evaluation of chronic, worsening and severe bilateral lower back pain radiating down right lateral leg to foot. Pain ongoing for several years. States her pain occurs when laying on sides while sleeping. She describes as sharp, sore and aching sensation, currently rates as 8 out of 10. Patient has tried home exercise regimen, rest and use of multiple medications such as Gabapentin, Lyrica and Voltaren gel, states nothing seems to help alleviate her pain at this time. She was unable to tolerate Lyrica as it caused drowsiness. States she did attend formal physical therapy many years ago and reports these treatments did help to alleviate pain short term. Lumbar MRI imaging from February of this year exhibits unchanged right asymmetric disc bulge and superimposed right subarticular protrusion at L4-L5, there is right lateral recess narrowing at this level. There is also left lateral recess narrowing at L3-L4 and moderate bilateral foraminal narrowing at L5-S1. No high grade spinal canal stenosis noted. Patient underwent laminectomy and foraminotomy on the right at L5 by Dr. Kristeen Miss on 08/19/2021, she reports significant relief of pain for approximately 2 weeks post surgery. Post surgery lumbar x-rays on 09/10/2021 by Dr. Ellene Route exhibit remote laminectomy at the level of L5. States pain started to gradually return after this time period and is now severe. Patient does have history of multiple lumbar epidural steroid injections performed in our office over the years, she feels injections helped to provider longer sustained pain relief compared to surgical procedure. Patient denies  focal weakness, numbness and tingling. Patient denies recent trauma or falls.     {Oswestry Disability Score:26558}  Review of Systems  Musculoskeletal:  Positive for back pain.  Neurological:  Negative for tingling, sensory change, focal weakness and weakness.  All other systems reviewed and are negative.  Otherwise per HPI.  Assessment & Plan: Visit Diagnoses:    ICD-10-CM   1. Lumbar radiculopathy  M54.16 Ambulatory referral to Physical Medicine Rehab    2. Radiculopathy due to lumbar intervertebral disc disorder  M51.16 Ambulatory referral to Physical Medicine Rehab    3. Facet hypertrophy of lumbar region  M47.816 Ambulatory referral to Physical Medicine Rehab    4. Stenosis of lateral recess of lumbar spine  M48.061 Ambulatory referral to Physical Medicine Rehab    5. Foraminal stenosis of lumbar region  M48.061 Ambulatory referral to Physical Medicine Rehab       Plan: Findings:  Chronic, worsening and severe bilateral lower back pain radiating to right lateral leg down to foot when laying on side to sleep. Patient continues to have severe pain despite good conservative therapies such as home exercise regimen, rest and use of medications. Patients clinical presentation and exam are consistent with L5 nerve pattern. Next step is to perform diagnostic and hopefully therapeutic right L5 transforaminal under fluoroscopic guidance. If patient gets significant and lasting relief of pain we did discuss possibility of repeating this injection infrequently as needed. We do feel patient needs to follow up with Dr. Ellene Route if her pain persists, could consider repeating lumbar MRI imaging and medication management as patient did recently discontinue Gabapentin as she did not feel this medication was  helping alleviate her pain. We did explain to patient that relief of pain can take time post surgery, per Dr. Clarice Pole recent [place] note he does suspect that her pain could be residual neuropathy from  the irritated nerves in the tight foramen that were decompressed. We will get patient in quickly for injection. No red flag symptoms noted upon exam today.     Meds & Orders: No orders of the defined types were placed in this encounter.   Orders Placed This Encounter  Procedures   Ambulatory referral to Physical Medicine Rehab    Follow-up: Return for Right L5 transforaminal epidural steroid injection.   Procedures: No procedures performed      Clinical History: MRI LUMBAR SPINE WITHOUT CONTRAST   TECHNIQUE: Multiplanar, multisequence MR imaging of the lumbar spine was performed. No intravenous contrast was administered.   COMPARISON:  11/07/2017   FINDINGS: Segmentation:  Standard.   Alignment:  Physiologic.   Vertebrae:  No fracture, evidence of discitis, or bone lesion.   Conus medullaris and cauda equina: Conus extends to the L1 level. Conus and cauda equina appear normal.   Paraspinal and other soft tissues: Negative.   Disc levels:   L1-L2: Normal disc space and facet joints. No spinal canal stenosis. No neural foraminal stenosis.   L2-L3: Progression of small disc bulge with facet hypertrophy. No spinal canal stenosis. No neural foraminal stenosis.   L3-L4: Unchanged left asymmetric disc bulge and mild facet hypertrophy. Left lateral recess narrowing without central spinal canal stenosis. No neural foraminal stenosis.   L4-L5: Unchanged right asymmetric disc bulge and superimposed right subarticular protrusion. Right lateral recess narrowing without central spinal canal stenosis. No neural foraminal stenosis.   L5-S1: Disc space narrowing with small bulge and endplate spurring. No spinal canal stenosis. Unchanged moderate bilateral neural foraminal stenosis.   Visualized sacrum: Normal.   IMPRESSION: 1. Unchanged moderate bilateral L5-S1 neural foraminal stenosis. 2. Unchanged right L4-5 lateral recess stenosis. 3. Unchanged left L3-4 lateral recess  stenosis.     Electronically Signed   By: Ulyses Jarred M.D.   On: 05/19/2021 03:01   She reports that she has never smoked. She has never used smokeless tobacco. No results for input(s): "HGBA1C", "LABURIC" in the last 8760 hours.  Objective:  VS:  HT:    WT:   BMI:     BP:136/72  HR:78bpm  TEMP: ( )  RESP:  Physical Exam Vitals and nursing note reviewed.  HENT:     Head: Normocephalic and atraumatic.     Right Ear: External ear normal.     Left Ear: External ear normal.     Nose: Nose normal.     Mouth/Throat:     Mouth: Mucous membranes are moist.  Eyes:     Extraocular Movements: Extraocular movements intact.  Cardiovascular:     Rate and Rhythm: Normal rate.     Pulses: Normal pulses.  Pulmonary:     Effort: Pulmonary effort is normal.  Abdominal:     General: Abdomen is flat. There is no distension.  Musculoskeletal:        General: Tenderness present.     Cervical back: Normal range of motion.     Comments: Pt rises from seated position to standing without difficulty. Good lumbar range of motion. Strong distal strength without clonus, no pain upon palpation of greater trochanters. Sensation intact bilaterally. Dysesthesias noted to right L5 dermatome. Walks independently, gait steady.   Skin:    General: Skin is warm  and dry.     Capillary Refill: Capillary refill takes less than 2 seconds.  Neurological:     General: No focal deficit present.     Mental Status: She is alert and oriented to person, place, and time.  Psychiatric:        Mood and Affect: Mood normal.        Behavior: Behavior normal.     Ortho Exam  Imaging: No results found.  Past Medical/Family/Surgical/Social History: Medications & Allergies reviewed per EMR, new medications updated. Patient Active Problem List   Diagnosis Date Noted   Fatigue 11/10/2021   Pain in right elbow 10/15/2020   Pain in right hip 04/30/2019   Neck pain on right side 12/25/2018   Pain in right shoulder  12/11/2018   Pain in right hand 05/18/2018   Pain in left hand 05/18/2018   Unilateral primary osteoarthritis, right knee 08/24/2016   Osteopenia 11/16/2015   Hypertriglyceridemia without hypercholesterolemia 09/03/2013   CHEST PAIN-PRECORDIAL 08/31/2009   CHEST PAIN, ATYPICAL 08/28/2009   Past Medical History:  Diagnosis Date   Allergy    Seasonal   Arthritis    back   GERD (gastroesophageal reflux disease)    in past   Family History  Problem Relation Age of Onset   Parkinson's disease Mother    Brain cancer Father    Breast cancer Neg Hx    Past Surgical History:  Procedure Laterality Date   BACK SURGERY     Scheduled for 08/19/2021., Dr, Elyn Peers.   CERVICAL DISC SURGERY     over 10 years   COLONOSCOPY     Social History   Occupational History   Not on file  Tobacco Use   Smoking status: Never   Smokeless tobacco: Never  Vaping Use   Vaping Use: Never used  Substance and Sexual Activity   Alcohol use: No   Drug use: No   Sexual activity: Not on file

## 2022-01-18 NOTE — Progress Notes (Unsigned)
Numeric Pain Rating Scale and Functional Assessment Average Pain 0   In the last MONTH (on 0-10 scale) has pain interfered with the following?  1. General activity like being  able to carry out your everyday physical activities such as walking, climbing stairs, carrying groceries, or moving a chair?  Rating(9)     Pain is mainly at night when lying down on either side. Pain radiates down right leg. Uses Voltaren for pain

## 2022-01-26 ENCOUNTER — Telehealth (HOSPITAL_BASED_OUTPATIENT_CLINIC_OR_DEPARTMENT_OTHER): Payer: Self-pay | Admitting: Physical Medicine and Rehabilitation

## 2022-01-26 NOTE — Telephone Encounter (Signed)
Patient wants to know the status of cortisol shop is. [phone]

## 2022-01-27 NOTE — Telephone Encounter (Addendum)
Patient spoke with Barnet Pall, NP yesterday to discuss injection

## 2022-02-03 ENCOUNTER — Ambulatory Visit: Payer: Self-pay

## 2022-02-03 ENCOUNTER — Ambulatory Visit: Payer: PPO | Admitting: Physical Medicine and Rehabilitation

## 2022-02-03 DIAGNOSIS — M5416 Radiculopathy, lumbar region: Secondary | ICD-10-CM

## 2022-02-03 DIAGNOSIS — M961 Postlaminectomy syndrome, not elsewhere classified: Secondary | ICD-10-CM

## 2022-02-03 MED ORDER — METHYLPREDNISOLONE ACETATE 80 MG/ML IJ SUSP
80.0000 mg | Freq: Once | INTRAMUSCULAR | Status: AC
  Administered 2022-02-03: 80 mg

## 2022-02-03 NOTE — Progress Notes (Signed)
Present for lumbar ESI. States she is having back pain with radicular leg pain. States leg pain is primarily at night and is waking her from sleep.

## 2022-02-03 NOTE — Patient Instructions (Signed)

## 2022-02-07 ENCOUNTER — Other Ambulatory Visit: Payer: Self-pay

## 2022-02-07 ENCOUNTER — Telehealth: Payer: Self-pay | Admitting: Pharmacist

## 2022-02-07 MED ORDER — ESTRADIOL 1 MG PO TABS: 1.0000 mg | ORAL_TABLET | Freq: Every day | ORAL | 3 refills | Status: DC

## 2022-02-07 MED ORDER — DICLOFENAC SODIUM 75 MG PO TBEC: 75.0000 mg | DELAYED_RELEASE_TABLET | Freq: Two times a day (BID) | ORAL | 2 refills | Status: DC

## 2022-02-07 MED ORDER — PROGESTERONE MICRONIZED 100 MG PO CAPS: 100.0000 mg | ORAL_CAPSULE | Freq: Every day | ORAL | 3 refills | Status: DC

## 2022-02-07 NOTE — Progress Notes (Signed)
Chronic Care Management Pharmacy Assistant   Name: Taylor Taylor  MRN: [id number] DOB: June 02, 1945   Reason for Encounter: Medication Coordination Call    Recent office visits:  11/18/2021 [facility] (PCP) Jenna Luo, T, MD; no medication changes noted.  Recent consult visits:  01/18/2022 [facility] (Orthopedics) Barnet Pall E, NP; no medication changes noted.  Hospital visits:  None in previous 6 months  Medications: Outpatient Encounter Medications as of 02/07/2022  Medication Sig   acetaminophen (TYLENOL) 500 MG tablet Take 500 mg by mouth 2 (two) times daily.   alendronate (FOSAMAX) 70 MG tablet TAKE 1 TABLET BY MOUTH ONCE WEEKLY BEFORE THE FIRST FOOD, BEVERAGE OR MEDICINE OF THE DAY WITH PLAIN WATER   Calcium Carbonate-Vit D-Min (CALCIUM 1200 PO) Take 1 tablet by mouth in the morning and at bedtime.   cholecalciferol (VITAMIN D3) 25 MCG (1000 UNIT) tablet Take 125 mcg by mouth daily.   diazepam (VALIUM) 5 MG tablet Take 1 tablet (5 mg total) by mouth at bedtime as needed for anxiety. (Patient not taking: Reported on 01/18/2022)   diclofenac (VOLTAREN) 75 MG EC tablet TAKE ONE TABLET BY MOUTH TWICE DAILY   estradiol (ESTRACE) 1 MG tablet Take 1 tablet (1 mg total) by mouth daily.   ferrous sulfate 325 (65 FE) MG tablet Take 325 mg by mouth daily with breakfast.   Fish Oil-Cholecalciferol (FISH OIL + D3) 1200-1000 MG-UNIT CAPS Take 1 tablet by mouth daily.   Glucosamine-Chondroit-Vit C-Mn (GLUCOSAMINE CHONDR 1500 COMPLX) CAPS Take 1 capsule by mouth 2 (two) times daily.   Melatonin 10 MG CAPS Take 10 mg by mouth at bedtime as needed (sleep).    modafinil (PROVIGIL) 200 MG tablet Take 1 tablet (200 mg total) by mouth daily.   Multiple Vitamins-Minerals (WOMENS MULTI VITAMIN & MINERAL PO) Take 1 tablet by mouth daily.   progesterone (PROMETRIUM) 100 MG capsule Take 1 capsule (100 mg total) by mouth daily.   Red Yeast Rice 600 MG CAPS Take 600 mg by mouth daily.    traMADol  (ULTRAM) 50 MG tablet Take 50-100 mg by mouth 4 (four) times daily as needed.   Turmeric 500 MG TABS Take 500 mg by mouth daily.    venlafaxine XR (EFFEXOR-XR) 150 MG 24 hr capsule TAKE ONE CAPSULE BY MOUTH EVERY MORNING   vitamin B-12 (CYANOCOBALAMIN) 100 MCG tablet Take 100 mcg by mouth daily.   vitamin E 100 UNIT capsule Take 100 Units by mouth daily.   Facility-Administered Encounter Medications as of 02/07/2022  Medication   0.9 %  sodium chloride infusion   methylPREDNISolone acetate (DEPO-MEDROL) injection 80 mg   Reviewed chart for medication changes ahead of medication coordination call.  No medication changes indicated.  BP Readings from Last 3 Encounters:  01/18/22 136/72  11/18/21 112/62  07/27/21 118/70    Lab Results  Component Value Date   HGBA1C 5.4 07/20/2020     Patient obtains medications through Vials  90 Days   Last adherence delivery included: Alendronate 70 mg once a week Diclofenac Sodium 75 mg twice a day Venlafaxine ER 150 mg once a day Estradiol 1 mg once a day Progesterone 100 mg once a day Modafanil 20 mg once daily Gabapentin 300 mg twice daily  Patient is due for next adherence delivery on: 02/18/2022. Called patient and reviewed medications and coordinated delivery.  This delivery to include: Alendronate 70 mg once a week Diclofenac Sodium 75 mg twice a day Venlafaxine ER 150 mg once a day  Estradiol 1 mg once a day Progesterone 100 mg once a day Modafanil 20 mg once daily  Patient needs refills for: Estradiol 1 mg once a day Progesterone 100 mg once a day Diclofenac Sodium 75 mg once a day - patient states Dr. Dennard Schaumann told her to only take this medication once a day instead of twice a day.  Confirmed delivery date of 02/18/2022, advised patient that pharmacy will contact them the morning of delivery.  Care Gaps: Medicare Annual Wellness: Completed 08/05/2021 Hemoglobin A1C: 5.4% on 07/20/2020 Colonoscopy: Last completed 1/12008 Dexa  Scan: Completed Mammogram: Next due on 10/14/2021  Future Appointments  Date Time Provider [place]  08/11/2022 10:30 AM [facility] HEALTH ADVISOR [id number]   April D Calhoun, [place] Pharmacist Assistant [phone]

## 2022-02-09 NOTE — Progress Notes (Signed)
Taylor Taylor - 75 y.o. female MRN [id number]  Date of birth: [birth year]  Office Visit Note: Visit Date: 02/03/2022 PCP: [name], MD Referred by: [name], MD  Subjective: Chief Complaint  Patient presents with   Lower Back - Pain   HPI:  Taylor Taylor is a 76 y.o. female who comes in today at the request of Barnet Pall, FNP for planned Right L5-S1 Lumbar Transforaminal epidural steroid injection with fluoroscopic guidance.  The patient has failed conservative care including home exercise, medications, time and activity modification.  This injection will be diagnostic and hopefully therapeutic.  Please see requesting physician notes for further details and justification.   ROS Otherwise per HPI.  Assessment & Plan: Visit Diagnoses:    ICD-10-CM   1. Lumbar radiculopathy  M54.16 XR C-ARM NO REPORT    Epidural Steroid injection    methylPREDNISolone acetate (DEPO-MEDROL) injection 80 mg    2. Post laminectomy syndrome  M96.1       Plan: No additional findings.   Meds & Orders:  Meds ordered this encounter  Medications   methylPREDNISolone acetate (DEPO-MEDROL) injection 80 mg    Orders Placed This Encounter  Procedures   XR C-ARM NO REPORT   Epidural Steroid injection    Follow-up: Return for visit to requesting provider as needed.   Procedures: No procedures performed  Lumbosacral Transforaminal Epidural Steroid Injection - Sub-Pedicular Approach with Fluoroscopic Guidance  [name]      Date of Birth: 09/15/1945 MRN: [id number] PCP: [name], MD      Visit Date: 02/03/2022   Universal Protocol:    Date/Time: 02/03/2022  Consent Given By: the patient  Position: PRONE  Additional Comments: Vital signs were monitored before and after the procedure. Patient was prepped and draped in the usual sterile fashion. The correct patient, procedure, and site was verified.   Injection Procedure Details:    Procedure diagnoses: Lumbar radiculopathy [M54.16]    Meds Administered:  Meds ordered this encounter  Medications   methylPREDNISolone acetate (DEPO-MEDROL) injection 80 mg    Laterality: Right  Location/Site: L5  Needle:5.0 in., 22 ga.  Short bevel or Quincke spinal needle  Needle Placement: Transforaminal  Findings:    -Comments: Excellent flow of contrast along the nerve, nerve root and into the epidural space.  Procedure Details: After squaring off the end-plates to get a true AP view, the C-arm was positioned so that an oblique view of the foramen as noted above was visualized. The target area is just inferior to the "nose of the scotty dog" or sub pedicular. The soft tissues overlying this structure were infiltrated with 2-3 ml. of 1% Lidocaine without Epinephrine.  The spinal needle was inserted toward the target using a "trajectory" view along the fluoroscope beam.  Under AP and lateral visualization, the needle was advanced so it did not puncture dura and was located close the 6 O'Clock position of the pedical in AP tracterory. Biplanar projections were used to confirm position. Aspiration was confirmed to be negative for CSF and/or blood. A 1-2 ml. volume of Isovue-250 was injected and flow of contrast was noted at each level. Radiographs were obtained for documentation purposes.   After attaining the desired flow of contrast documented above, a 0.5 to 1.0 ml test dose of 0.25% Marcaine was injected into each respective transforaminal space.  The patient was observed for 90 seconds post injection.  After no sensory deficits were reported, and normal lower  extremity motor function was noted,   the above injectate was administered so that equal amounts of the injectate were placed at each foramen (level) into the transforaminal epidural space.   Additional Comments:  No complications occurred Dressing: 2 x 2 sterile gauze and Band-Aid    Post-procedure details: Patient  was observed during the procedure. Post-procedure instructions were reviewed.  Patient left the clinic in stable condition.    Clinical History: MRI LUMBAR SPINE WITHOUT CONTRAST   TECHNIQUE: Multiplanar, multisequence MR imaging of the lumbar spine was performed. No intravenous contrast was administered.   COMPARISON:  11/07/2017   FINDINGS: Segmentation:  Standard.   Alignment:  Physiologic.   Vertebrae:  No fracture, evidence of discitis, or bone lesion.   Conus medullaris and cauda equina: Conus extends to the L1 level. Conus and cauda equina appear normal.   Paraspinal and other soft tissues: Negative.   Disc levels:   L1-L2: Normal disc space and facet joints. No spinal canal stenosis. No neural foraminal stenosis.   L2-L3: Progression of small disc bulge with facet hypertrophy. No spinal canal stenosis. No neural foraminal stenosis.   L3-L4: Unchanged left asymmetric disc bulge and mild facet hypertrophy. Left lateral recess narrowing without central spinal canal stenosis. No neural foraminal stenosis.   L4-L5: Unchanged right asymmetric disc bulge and superimposed right subarticular protrusion. Right lateral recess narrowing without central spinal canal stenosis. No neural foraminal stenosis.   L5-S1: Disc space narrowing with small bulge and endplate spurring. No spinal canal stenosis. Unchanged moderate bilateral neural foraminal stenosis.   Visualized sacrum: Normal.   IMPRESSION: 1. Unchanged moderate bilateral L5-S1 neural foraminal stenosis. 2. Unchanged right L4-5 lateral recess stenosis. 3. Unchanged left L3-4 lateral recess stenosis.     Electronically Signed   By: Ulyses Jarred M.D.   On: 05/19/2021 03:01     Objective:  VS:  HT:    WT:   BMI:     BP:   HR: bpm  TEMP: ( )  RESP:  Physical Exam Vitals and nursing note reviewed.  Constitutional:      General: She is not in acute distress.    Appearance: Normal appearance. She is  not ill-appearing.  HENT:     Head: Normocephalic and atraumatic.     Right Ear: External ear normal.     Left Ear: External ear normal.  Eyes:     Extraocular Movements: Extraocular movements intact.  Cardiovascular:     Rate and Rhythm: Normal rate.     Pulses: Normal pulses.  Pulmonary:     Effort: Pulmonary effort is normal. No respiratory distress.  Abdominal:     General: There is no distension.     Palpations: Abdomen is soft.  Musculoskeletal:        General: Tenderness present.     Cervical back: Neck supple.     Right lower leg: No edema.     Left lower leg: No edema.     Comments: Patient has good distal strength with no pain over the greater trochanters.  No clonus or focal weakness.  Skin:    Findings: No erythema, lesion or rash.  Neurological:     General: No focal deficit present.     Mental Status: She is alert and oriented to person, place, and time.     Sensory: No sensory deficit.     Motor: No weakness or abnormal muscle tone.     Coordination: Coordination normal.  Psychiatric:  Mood and Affect: Mood normal.        Behavior: Behavior normal.      Imaging: No results found.

## 2022-02-09 NOTE — Procedures (Signed)
Lumbosacral Transforaminal Epidural Steroid Injection - Sub-Pedicular Approach with Fluoroscopic Guidance  [name]      Date of Birth: October 03, 1945 MRN: [id number] PCP: [name], MD      Visit Date: 02/03/2022   Universal Protocol:    Date/Time: 02/03/2022  Consent Given By: the patient  Position: PRONE  Additional Comments: Vital signs were monitored before and after the procedure. Patient was prepped and draped in the usual sterile fashion. The correct patient, procedure, and site was verified.   Injection Procedure Details:   Procedure diagnoses: Lumbar radiculopathy [M54.16]    Meds Administered:  Meds ordered this encounter  Medications   methylPREDNISolone acetate (DEPO-MEDROL) injection 80 mg    Laterality: Right  Location/Site: L5  Needle:5.0 in., 22 ga.  Short bevel or Quincke spinal needle  Needle Placement: Transforaminal  Findings:    -Comments: Excellent flow of contrast along the nerve, nerve root and into the epidural space.  Procedure Details: After squaring off the end-plates to get a true AP view, the C-arm was positioned so that an oblique view of the foramen as noted above was visualized. The target area is just inferior to the "nose of the scotty dog" or sub pedicular. The soft tissues overlying this structure were infiltrated with 2-3 ml. of 1% Lidocaine without Epinephrine.  The spinal needle was inserted toward the target using a "trajectory" view along the fluoroscope beam.  Under AP and lateral visualization, the needle was advanced so it did not puncture dura and was located close the 6 O'Clock position of the pedical in AP tracterory. Biplanar projections were used to confirm position. Aspiration was confirmed to be negative for CSF and/or blood. A 1-2 ml. volume of Isovue-250 was injected and flow of contrast was noted at each level. Radiographs were obtained for documentation purposes.   After attaining the  desired flow of contrast documented above, a 0.5 to 1.0 ml test dose of 0.25% Marcaine was injected into each respective transforaminal space.  The patient was observed for 90 seconds post injection.  After no sensory deficits were reported, and normal lower extremity motor function was noted,   the above injectate was administered so that equal amounts of the injectate were placed at each foramen (level) into the transforaminal epidural space.   Additional Comments:  No complications occurred Dressing: 2 x 2 sterile gauze and Band-Aid    Post-procedure details: Patient was observed during the procedure. Post-procedure instructions were reviewed.  Patient left the clinic in stable condition.

## 2022-04-01 ENCOUNTER — Encounter: Payer: Self-pay | Admitting: Family Medicine

## 2022-04-08 DIAGNOSIS — H04123 Dry eye syndrome of bilateral lacrimal glands: Secondary | ICD-10-CM | POA: Diagnosis not present

## 2022-06-02 ENCOUNTER — Ambulatory Visit (INDEPENDENT_AMBULATORY_CARE_PROVIDER_SITE_OTHER): Payer: PPO | Admitting: Family Medicine

## 2022-06-02 ENCOUNTER — Other Ambulatory Visit: Payer: PPO

## 2022-06-02 ENCOUNTER — Encounter: Payer: Self-pay | Admitting: Family Medicine

## 2022-06-02 VITALS — BP 112/64 | HR 79 | Temp 99.0°F | Ht 63.0 in | Wt 131.4 lb

## 2022-06-02 DIAGNOSIS — R413 Other amnesia: Secondary | ICD-10-CM

## 2022-06-02 DIAGNOSIS — Z780 Asymptomatic menopausal state: Secondary | ICD-10-CM

## 2022-06-02 DIAGNOSIS — Z0001 Encounter for general adult medical examination with abnormal findings: Secondary | ICD-10-CM

## 2022-06-02 DIAGNOSIS — M8580 Other specified disorders of bone density and structure, unspecified site: Secondary | ICD-10-CM

## 2022-06-02 DIAGNOSIS — R5382 Chronic fatigue, unspecified: Secondary | ICD-10-CM | POA: Diagnosis not present

## 2022-06-02 DIAGNOSIS — R5383 Other fatigue: Secondary | ICD-10-CM | POA: Diagnosis not present

## 2022-06-02 DIAGNOSIS — E781 Pure hyperglyceridemia: Secondary | ICD-10-CM

## 2022-06-02 DIAGNOSIS — Z0000 Encounter for general adult medical examination without abnormal findings: Secondary | ICD-10-CM

## 2022-06-02 NOTE — Progress Notes (Signed)
Subjective:    Patient ID: Taylor Taylor, female    DOB: 01-05-1946, 77 y.o.   MRN: [id number]  HPI  07/2021 Over the last year or so, have seen the patient multiple times with fatigue.  Today, CBC, CMP, TSH, B12 levels involving normal.  Her colonoscopy and mammograms are up-to-date.  There has not been an explanation for her fatigue.  Now she is dealing with severe low back pain.  She has spinal stenosis and her neurosurgeon is planning surgery.  She is not sleeping well due to the pain in her back.  This could be contributing to her fatigue however the fatigue predated the trouble with her back.  She is also worried a tremendous amount about her sister who has failing health.  Therefore there is definitely stress in her life however she does not consider herself depressed.  She still sees GYN.  Therefore the question is whether she continued to have severe fatigue.  She denies any fevers or chills or weight loss.  She does have some nausea.  She is having some diarrhea.  She also has right lower quadrant abdominal pain.  She states the pain is intense and has been getting worse over the last week or so.  She denies any vaginal bleeding.  She denies any hematuria.  She denies any melena or hematochezia.  At that time, my plan   I am concerned about the right lower quadrant abdominal pain.  I will start by obtaining CT scan given the worsening pain over the last week and the severe fatigue.  If the CT scan of the abdomen and pelvis is normal, I feel the best thorough diagnostic test to rule out any occult malignancy or infection.  I will repeat a CBC a CMP and a sedimentation rate.  If the sed rate is normal I feel that that would rule out autoimmune diseases along with normal labs.  I will try the patient on Valium 5 mg p.o. nightly to see if the insomnia could be contributing to her fatigue.  If none of this is helping I would recommend trying Provigil for chronic fatigue syndrome.  Await the  results of the CT scan and the lab work  11/18/21 Patient is here today for follow-up.  CT scan of the abdomen pelvis was unremarkable in May.  Patient continues to endorse severe fatigue despite an extensive work-up.  Labs have been unremarkable on several occasions.  CT scan was unremarkable.  Colonoscopy was negative in 2018.  Essentially the patient reports lack of energy.  We have even tried venlafaxine for possible depression and Provigil for chronic fatigue syndrome however the patient took the Provigil for over a month and saw no benefit despite taking 200 mg a day.  She continues to endorse severe fatigue.  She states that on a daily basis she has very little energy even to get off the couch or do anything.  Her weight remains relatively stable.  Her weight is down 3 pounds from May.  She denies any chest pain.  She denies any shortness of breath.  She had an echocardiogram in February that was normal.  She denies any cough or pleurisy or hemoptysis.  She denies any nausea or vomiting.  She does occasionally have diarrhea once every 2 weeks or so but this is not consistent or heavy.  She denies any joint pains or rash or muscle pains.  At that time, my plan was: At this point, I do not know  what else could be causing her fatigue.  Her diagnostic work-up has been negative today.  Symptoms have remained relatively stable and have been present since prior to December 2021.  Therefore I do not feel that there is a serious life-threatening illness.  I have not screened the patient for adrenal insufficiency.  I will check a baseline cortisol level and will consult endocrinology to see if there is any other hormonal issues that may be causing fatigue that I may not be considering.    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  She mentions chronic fatigue again.  She also reports some memory  loss.  I performed a Mini-Mental status exam.  The patient struggled to perform serial sevens and scored 27 out of 30.  She had some difficult time spelling world backwards as well but she was eventually able to come to the correct answer.  On clock drawing exercises, she did not space the numbers properly and had a large gap between 10 and 12.  She also had an extra hand on the clock when I asked her to place the time at 345.  She drew a long hand to 12, short hand to 3, and a long hand to 7.  Therefore I am very concerned that the patient may be showing some signs of age-related cognitive decline.  We spent the majority of our time today discussing this.  She also had forgotten many of our discussions regarding her chronic fatigue over the last year.  She denies that her husband has noted any memory issues.  She denies forgetting conversations or losing money or leaving on appliances.  Mammogram and colonoscopy are up-to-date.  Immunizations are up-to-date.  Blood work is pending.  Bone density test is due next year  Past Medical History:  Diagnosis Date   Allergy    Seasonal   Arthritis    back   GERD (gastroesophageal reflux disease)    in past   Past Surgical History:  Procedure Laterality Date   BACK SURGERY     Scheduled for 08/19/2021., Dr, Elyn Peers.   CERVICAL DISC SURGERY     over 10 years   COLONOSCOPY     Current Outpatient Medications on File Prior to Visit  Medication Sig Dispense Refill   acetaminophen (TYLENOL) 500 MG tablet Take 500 mg by mouth 2 (two) times daily.     alendronate (FOSAMAX) 70 MG tablet TAKE 1 TABLET BY MOUTH ONCE WEEKLY BEFORE THE FIRST FOOD, BEVERAGE OR MEDICINE OF THE DAY WITH PLAIN WATER 12 tablet 3   Calcium Carbonate-Vit D-Min (CALCIUM 1200 PO) Take 1 tablet by mouth in the morning and at bedtime.     cholecalciferol (VITAMIN D3) 25 MCG (1000 UNIT) tablet Take 125 mcg by mouth daily.     diazepam (VALIUM) 5 MG tablet Take 1 tablet (5 mg total) by mouth at  bedtime as needed for anxiety. 30 tablet 1   diclofenac (VOLTAREN) 75 MG EC tablet Take 1 tablet (75 mg total) by mouth 2 (two) times daily. 90 tablet 2   estradiol (ESTRACE) 1 MG tablet Take 1 tablet (1 mg total) by mouth daily. 90 tablet 3   ferrous sulfate 325 (65 FE) MG tablet Take 325 mg by mouth daily with breakfast.     Fish Oil-Cholecalciferol (FISH OIL + D3) 1200-1000 MG-UNIT CAPS Take 1 tablet by mouth daily.  Glucosamine-Chondroit-Vit C-Mn (GLUCOSAMINE CHONDR 1500 COMPLX) CAPS Take 1 capsule by mouth 2 (two) times daily.     Lifitegrast (XIIDRA) 5 % SOLN Apply to eye.     Melatonin 10 MG CAPS Take 10 mg by mouth at bedtime as needed (sleep).      modafinil (PROVIGIL) 200 MG tablet Take 1 tablet (200 mg total) by mouth daily. 90 tablet 2   Multiple Vitamins-Minerals (WOMENS MULTI VITAMIN & MINERAL PO) Take 1 tablet by mouth daily.     progesterone (PROMETRIUM) 100 MG capsule Take 1 capsule (100 mg total) by mouth daily. 90 capsule 3   Red Yeast Rice 600 MG CAPS Take 600 mg by mouth daily.      Turmeric 500 MG TABS Take 500 mg by mouth daily.      venlafaxine XR (EFFEXOR-XR) 150 MG 24 hr capsule TAKE ONE CAPSULE BY MOUTH EVERY MORNING 90 capsule 2   vitamin B-12 (CYANOCOBALAMIN) 100 MCG tablet Take 100 mcg by mouth daily.     vitamin E 100 UNIT capsule Take 100 Units by mouth daily.     Current Facility-Administered Medications on File Prior to Visit  Medication Dose Route Frequency Provider Last Rate Last Admin   0.9 %  sodium chloride infusion  500 mL Intravenous Continuous Pyrtle, Lajuan Lines, MD       Allergies  Allergen Reactions   Pollen Extract Itching    sneezing   Social History   Socioeconomic History   Marital status: Married    Spouse name: Not on file   Number of children: Not on file   Years of education: Not on file   Highest education level: Not on file  Occupational History   Not on file  Tobacco Use   Smoking status: Never   Smokeless tobacco: Never   Vaping Use   Vaping Use: Never used  Substance and Sexual Activity   Alcohol use: No   Drug use: No   Sexual activity: Not on file  Other Topics Concern   Not on file  Social History Narrative   Not on file   Social Determinants of Health   Financial Resource Strain: Low Risk  (08/05/2021)   Overall Financial Resource Strain (CARDIA)    Difficulty of Paying Living Expenses: Not hard at all  Food Insecurity: No Food Insecurity (08/05/2021)   Hunger Vital Sign    Worried About Running Out of Food in the Last Year: Never true    Ran Out of Food in the Last Year: Never true  Transportation Needs: No Transportation Needs (08/05/2021)   PRAPARE - Hydrologist (Medical): No    Lack of Transportation (Non-Medical): No  Physical Activity: Sufficiently Active (08/05/2021)   Exercise Vital Sign    Days of Exercise per Week: 4 days    Minutes of Exercise per Session: 40 min  Stress: No Stress Concern Present (08/05/2021)   [place]    Feeling of Stress : Not at all  Social Connections: [place] (08/05/2021)   Social Connection and Isolation Panel [NHANES]    Frequency of Communication with Friends and Family: More than three times a week    Frequency of Social Gatherings with Friends and Family: More than three times a week    Attends Religious Services: More than 4 times per year    Active Member of [employer] or Organizations: Yes    Attends Archivist Meetings: More than  4 times per year    Marital Status: Married  Human resources officer Violence: Not At Risk (08/05/2021)   Humiliation, Afraid, Rape, and Kick questionnaire    Fear of Current or Ex-Partner: No    Emotionally Abused: No    Physically Abused: No    Sexually Abused: No    Past Medical History:  Diagnosis Date   Allergy    Seasonal   Arthritis    back   GERD (gastroesophageal reflux disease)    in past   Past  Surgical History:  Procedure Laterality Date   BACK SURGERY     Scheduled for 08/19/2021., Dr, Elyn Peers.   CERVICAL DISC SURGERY     over 10 years   COLONOSCOPY     Current Outpatient Medications on File Prior to Visit  Medication Sig Dispense Refill   acetaminophen (TYLENOL) 500 MG tablet Take 500 mg by mouth 2 (two) times daily.     alendronate (FOSAMAX) 70 MG tablet TAKE 1 TABLET BY MOUTH ONCE WEEKLY BEFORE THE FIRST FOOD, BEVERAGE OR MEDICINE OF THE DAY WITH PLAIN WATER 12 tablet 3   Calcium Carbonate-Vit D-Min (CALCIUM 1200 PO) Take 1 tablet by mouth in the morning and at bedtime.     cholecalciferol (VITAMIN D3) 25 MCG (1000 UNIT) tablet Take 125 mcg by mouth daily.     diazepam (VALIUM) 5 MG tablet Take 1 tablet (5 mg total) by mouth at bedtime as needed for anxiety. 30 tablet 1   diclofenac (VOLTAREN) 75 MG EC tablet Take 1 tablet (75 mg total) by mouth 2 (two) times daily. 90 tablet 2   estradiol (ESTRACE) 1 MG tablet Take 1 tablet (1 mg total) by mouth daily. 90 tablet 3   ferrous sulfate 325 (65 FE) MG tablet Take 325 mg by mouth daily with breakfast.     Fish Oil-Cholecalciferol (FISH OIL + D3) 1200-1000 MG-UNIT CAPS Take 1 tablet by mouth daily.     Glucosamine-Chondroit-Vit C-Mn (GLUCOSAMINE CHONDR 1500 COMPLX) CAPS Take 1 capsule by mouth 2 (two) times daily.     Lifitegrast (XIIDRA) 5 % SOLN Apply to eye.     Melatonin 10 MG CAPS Take 10 mg by mouth at bedtime as needed (sleep).      modafinil (PROVIGIL) 200 MG tablet Take 1 tablet (200 mg total) by mouth daily. 90 tablet 2   Multiple Vitamins-Minerals (WOMENS MULTI VITAMIN & MINERAL PO) Take 1 tablet by mouth daily.     progesterone (PROMETRIUM) 100 MG capsule Take 1 capsule (100 mg total) by mouth daily. 90 capsule 3   Red Yeast Rice 600 MG CAPS Take 600 mg by mouth daily.      Turmeric 500 MG TABS Take 500 mg by mouth daily.      venlafaxine XR (EFFEXOR-XR) 150 MG 24 hr capsule TAKE ONE CAPSULE BY MOUTH EVERY MORNING 90  capsule 2   vitamin B-12 (CYANOCOBALAMIN) 100 MCG tablet Take 100 mcg by mouth daily.     vitamin E 100 UNIT capsule Take 100 Units by mouth daily.     Current Facility-Administered Medications on File Prior to Visit  Medication Dose Route Frequency Provider Last Rate Last Admin   0.9 %  sodium chloride infusion  500 mL Intravenous Continuous Pyrtle, Lajuan Lines, MD       Allergies  Allergen Reactions   Pollen Extract Itching    sneezing   Social History   Socioeconomic History   Marital status: Married    Spouse name: Not on  file   Number of children: Not on file   Years of education: Not on file   Highest education level: Not on file  Occupational History   Not on file  Tobacco Use   Smoking status: Never   Smokeless tobacco: Never  Vaping Use   Vaping Use: Never used  Substance and Sexual Activity   Alcohol use: No   Drug use: No   Sexual activity: Not on file  Other Topics Concern   Not on file  Social History Narrative   Not on file   Social Determinants of Health   Financial Resource Strain: Low Risk  (08/05/2021)   Overall Financial Resource Strain (CARDIA)    Difficulty of Paying Living Expenses: Not hard at all  Food Insecurity: No Food Insecurity (08/05/2021)   Hunger Vital Sign    Worried About Running Out of Food in the Last Year: Never true    [place] in the Last Year: Never true  Transportation Needs: No Transportation Needs (08/05/2021)   PRAPARE - Hydrologist (Medical): No    Lack of Transportation (Non-Medical): No  Physical Activity: Sufficiently Active (08/05/2021)   Exercise Vital Sign    Days of Exercise per Week: 4 days    Minutes of Exercise per Session: 40 min  Stress: No Stress Concern Present (08/05/2021)   [place]    Feeling of Stress : Not at all  Social Connections: [place] (08/05/2021)   Social Connection and Isolation Panel  [NHANES]    Frequency of Communication with Friends and Family: More than three times a week    Frequency of Social Gatherings with Friends and Family: More than three times a week    Attends Religious Services: More than 4 times per year    Active Member of [employer] or Organizations: Yes    Attends Archivist Meetings: More than 4 times per year    Marital Status: Married  Human resources officer Violence: Not At Risk (08/05/2021)   Humiliation, Afraid, Rape, and Kick questionnaire    Fear of Current or Ex-Partner: No    Emotionally Abused: No    Physically Abused: No    Sexually Abused: No      Review of Systems  All other systems reviewed and are negative.      Objective:   Physical Exam Vitals reviewed.  Constitutional:      General: She is not in acute distress.    Appearance: She is well-developed. She is not diaphoretic.  HENT:     Head: Normocephalic and atraumatic.     Right Ear: External ear normal.     Left Ear: External ear normal.     Nose: Nose normal.     Mouth/Throat:     Pharynx: No oropharyngeal exudate.  Eyes:     Conjunctiva/sclera: Conjunctivae normal.     Pupils: Pupils are equal, round, and reactive to light.  Neck:     Thyroid: No thyromegaly.     Vascular: No JVD.     Trachea: No tracheal deviation.  Cardiovascular:     Rate and Rhythm: Normal rate and regular rhythm.     Heart sounds: Normal heart sounds. No murmur heard.    No friction rub. No gallop.  Pulmonary:     Effort: Pulmonary effort is normal. No respiratory distress.     Breath sounds: Normal breath sounds. No wheezing or rales.  Chest:  Chest wall: No tenderness.  Abdominal:     General: Abdomen is flat. Bowel sounds are normal. There is no distension.     Palpations: Abdomen is soft. There is no mass.     Tenderness: There is abdominal tenderness in the right lower quadrant. There is no guarding or rebound.  Musculoskeletal:        General: No tenderness. Normal range of  motion.     Cervical back: Neck supple.  Lymphadenopathy:     Cervical: No cervical adenopathy.  Skin:    Findings: No rash.  Neurological:     Mental Status: She is alert and oriented to person, place, and time.     Cranial Nerves: No cranial nerve deficit.     Motor: No abnormal muscle tone.     Coordination: Coordination normal.     Deep Tendon Reflexes: Reflexes normal.           Assessment & Plan:  Encounter for Medicare annual wellness exam  Chronic fatigue  Osteopenia, unspecified location  Postmenopausal estrogen deficiency  Memory loss I am concerned that the patient may be experiencing some age-related cognitive decline.  I have recommended a referral to neurology for neuropsychological evaluation/testing.  Lab work is pending.  Cancer screening up-to-date.  Bone density due next year.  Immunizations are up-to-date.  Patient will discontinue modafinil

## 2022-06-03 LAB — COMPLETE METABOLIC PANEL WITH GFR
AG Ratio: 1.5 (calc) (ref 1.0–2.5)
ALT: 18 U/L (ref 6–29)
AST: 17 U/L (ref 10–35)
Albumin: 4.1 g/dL (ref 3.6–5.1)
Alkaline phosphatase (APISO): 32 U/L — ABNORMAL LOW (ref 37–153)
BUN: 24 mg/dL (ref 7–25)
CO2: 30 mmol/L (ref 20–32)
Calcium: 9.3 mg/dL (ref 8.6–10.4)
Chloride: 105 mmol/L (ref 98–110)
Creat: 0.75 mg/dL (ref 0.60–1.00)
Globulin: 2.7 g/dL (calc) (ref 1.9–3.7)
Glucose, Bld: 95 mg/dL (ref 65–99)
Potassium: 5.2 mmol/L (ref 3.5–5.3)
Sodium: 141 mmol/L (ref 135–146)
Total Bilirubin: 0.4 mg/dL (ref 0.2–1.2)
Total Protein: 6.8 g/dL (ref 6.1–8.1)
eGFR: 82 mL/min/{1.73_m2} (ref >=60)

## 2022-06-03 LAB — CBC WITH DIFFERENTIAL/PLATELET
Absolute Monocytes: 540 cells/uL (ref 200–950)
Basophils Absolute: 49 cells/uL (ref 0–200)
Basophils Relative: 0.9 %
Eosinophils Absolute: 367 cells/uL (ref 15–500)
Eosinophils Relative: 6.8 %
HCT: 40.9 % (ref 35.0–45.0)
Hemoglobin: 13.4 g/dL (ref 11.7–15.5)
Lymphs Abs: 1987 cells/uL (ref 850–3900)
MCH: 31.1 pg (ref 27.0–33.0)
MCHC: 32.8 g/dL (ref 32.0–36.0)
MCV: 94.9 fL (ref 80.0–100.0)
MPV: 9.6 fL (ref 7.5–12.5)
Monocytes Relative: 10 %
Neutro Abs: 2457 cells/uL (ref 1500–7800)
Neutrophils Relative %: 45.5 %
Platelets: 304 10*3/uL (ref 140–400)
RBC: 4.31 10*6/uL (ref 3.80–5.10)
RDW: 11.8 % (ref 11.0–15.0)
Total Lymphocyte: 36.8 %
WBC: 5.4 10*3/uL (ref 3.8–10.8)

## 2022-06-03 LAB — LIPID PANEL
Cholesterol: 203 mg/dL — ABNORMAL HIGH (ref <200)
HDL: 79 mg/dL (ref >=50)
LDL Cholesterol (Calc): 107 mg/dL (calc) — ABNORMAL HIGH
Non-HDL Cholesterol (Calc): 124 mg/dL (calc) (ref <130)
Total CHOL/HDL Ratio: 2.6 (calc) (ref <5.0)
Triglycerides: 80 mg/dL (ref <150)

## 2022-06-06 ENCOUNTER — Telehealth: Payer: Self-pay

## 2022-06-06 NOTE — Telephone Encounter (Signed)
Pt called in asking if the referral she recently got from pcp could be resent. The office stated that they did not receive a referral from this office and pt was not able to schedule. Pt asks to please call her if this can be done please. Please advise.  Cb#: [phone]

## 2022-06-09 ENCOUNTER — Other Ambulatory Visit: Payer: Self-pay

## 2022-06-09 DIAGNOSIS — R413 Other amnesia: Secondary | ICD-10-CM

## 2022-06-10 ENCOUNTER — Encounter: Payer: Self-pay | Admitting: Family Medicine

## 2022-06-15 ENCOUNTER — Encounter: Payer: Self-pay | Admitting: Psychology

## 2022-07-04 ENCOUNTER — Other Ambulatory Visit: Payer: PPO

## 2022-07-06 ENCOUNTER — Telehealth: Payer: Self-pay | Admitting: Family Medicine

## 2022-07-06 NOTE — Telephone Encounter (Signed)
Contacted Taylor Taylor to schedule their annual wellness visit. Appointment made for 07/14/2022.  Thank you,  Judeth Cornfield,  AMB Clinical Support [facility] Program Direct Dial ??[phone]

## 2022-07-07 ENCOUNTER — Encounter: Payer: PPO | Admitting: Family Medicine

## 2022-07-13 ENCOUNTER — Ambulatory Visit (INDEPENDENT_AMBULATORY_CARE_PROVIDER_SITE_OTHER): Payer: PPO | Admitting: Family Medicine

## 2022-07-13 ENCOUNTER — Encounter: Payer: Self-pay | Admitting: Family Medicine

## 2022-07-13 VITALS — BP 100/70 | HR 86 | Temp 98.5°F | Ht 63.0 in | Wt 124.0 lb

## 2022-07-13 DIAGNOSIS — A084 Viral intestinal infection, unspecified: Secondary | ICD-10-CM

## 2022-07-13 HISTORY — DX: Viral intestinal infection, unspecified: A08.4

## 2022-07-13 NOTE — Progress Notes (Signed)
   Acute Office Visit  Subjective:     Patient ID: Taylor Taylor, female    DOB: 27-Jul-1945, 77 y.o.   MRN: [id number]  Chief Complaint  Patient presents with   Acute Visit    feeling swimmy headead and ears making weird noise/not feeling good overall    HPI Patient is in today for 4 days of feeling "swimmy headed", fatigue, thumping in ears, and diarrhea. She does report decreased fluid intake alongside diarrhea. Denies diminished hearing, fevers, congestion, cough, sinus pressure Symptoms overall improving. Has tried nothing. No known sick exposures.  Review of Systems  All other systems reviewed and are negative.       Objective:    BP 100/70   Pulse 86   Temp 98.5 F (36.9 C) (Oral)   Ht  (1.6 m)   Wt 124 lb (56.2 kg)   SpO2 98%   BMI 21.97 kg/m  BP Readings from Last 3 Encounters:  07/13/22 100/70  06/02/22 112/64  01/18/22 136/72      Physical Exam Vitals and nursing note reviewed.  Constitutional:      Appearance: Normal appearance. She is normal weight.  HENT:     Head: Normocephalic and atraumatic.     Right Ear: Tympanic membrane, ear canal and external ear normal.     Left Ear: Tympanic membrane, ear canal and external ear normal. There is impacted cerumen.  Cardiovascular:     Rate and Rhythm: Normal rate and regular rhythm.     Pulses: Normal pulses.     Heart sounds: Normal heart sounds.  Pulmonary:     Effort: Pulmonary effort is normal.     Breath sounds: Normal breath sounds.  Abdominal:     General: Bowel sounds are increased.     Palpations: Abdomen is soft.     Tenderness: There is no abdominal tenderness.  Skin:    General: Skin is warm and dry.  Neurological:     General: No focal deficit present.     Mental Status: She is alert and oriented to person, place, and time. Mental status is at baseline.  Psychiatric:        Mood and Affect: Mood normal.        Behavior: Behavior normal.        Thought Content: Thought  content normal.        Judgment: Judgment normal.     No results found for any visits on 07/13/22.      Assessment & Plan:   Problem List Items Addressed This Visit     Viral gastroenteritis - Primary    Patients symptoms consistent with viral gastroenteritis. I encouraged her to push fluids and electrolytes as she may have some dehydration causing her to feel fatigued. I did clean some impacted wax out of her ears and those symptoms improved. Encouraged her to return to office if her symptoms persist or worsen but she is overall improving.       No orders of the defined types were placed in this encounter.   Return if symptoms worsen or fail to improve.  Park Meo, FNP

## 2022-07-13 NOTE — Patient Instructions (Signed)
[name] , Thank you for taking time to come for your Medicare Wellness Visit. I appreciate your ongoing commitment to your health goals. Please review the following plan we discussed and let me know if I can assist you in the future.   These are the goals we discussed:  Goals      Remain active and independent        This is a list of the screening recommended for you and due dates:  Health Maintenance  Topic Date Due   COVID-19 Vaccine (5 - 2023-24 season) 11/19/2021   Flu Shot  10/20/2022   Mammogram  11/04/2022   Medicare Annual Wellness Visit  07/14/2023   Pneumonia Vaccine  Completed   DEXA scan (bone density measurement)  Completed   Hepatitis C Screening: USPSTF Recommendation to screen - Ages 29-79 yo.  Completed   Zoster (Shingles) Vaccine  Completed   HPV Vaccine  Aged Out   DTaP/Tdap/Td vaccine  Discontinued    Advanced directives: Please bring a copy of your health care power of attorney and living will to the office to be added to your chart at your convenience.   Conditions/risks identified: Aim for 30 minutes of exercise or brisk walking, 6-8 glasses of water, and 5 servings of fruits and vegetables each day.   Next appointment: Follow up in one year for your annual wellness visit    Preventive Care 65 Years and Older, Female Preventive care refers to lifestyle choices and visits with your health care provider that can promote health and wellness. What does preventive care include? A yearly physical exam. This is also called an annual well check. Dental exams once or twice a year. Routine eye exams. Ask your health care provider how often you should have your eyes checked. Personal lifestyle choices, including: Daily care of your teeth and gums. Regular physical activity. Eating a healthy diet. Avoiding tobacco and drug use. Limiting alcohol use. Practicing safe sex. Taking low-dose aspirin every day. Taking vitamin and mineral supplements as recommended  by your health care provider. What happens during an annual well check? The services and screenings done by your health care provider during your annual well check will depend on your age, overall health, lifestyle risk factors, and family history of disease. Counseling  Your health care provider may ask you questions about your: Alcohol use. Tobacco use. Drug use. Emotional well-being. Home and relationship well-being. Sexual activity. Eating habits. History of falls. Memory and ability to understand (cognition). Work and work Astronomer. Reproductive health. Screening  You may have the following tests or measurements: Height, weight, and BMI. Blood pressure. Lipid and cholesterol levels. These may be checked every 5 years, or more frequently if you are over 29 years old. Skin check. Lung cancer screening. You may have this screening every year starting at age 70 if you have a 30-pack-year history of smoking and currently smoke or have quit within the past 15 years. Fecal occult blood test (FOBT) of the stool. You may have this test every year starting at age 7. Flexible sigmoidoscopy or colonoscopy. You may have a sigmoidoscopy every 5 years or a colonoscopy every 10 years starting at age 74. Hepatitis C blood test. Hepatitis B blood test. Sexually transmitted disease (STD) testing. Diabetes screening. This is done by checking your blood sugar (glucose) after you have not eaten for a while (fasting). You may have this done every 1-3 years. Bone density scan. This is done to screen for osteoporosis. You may  have this done starting at age 45. Mammogram. This may be done every 1-2 years. Talk to your health care provider about how often you should have regular mammograms. Talk with your health care provider about your test results, treatment options, and if necessary, the need for more tests. Vaccines  Your health care provider may recommend certain vaccines, such as: Influenza  vaccine. This is recommended every year. Tetanus, diphtheria, and acellular pertussis (Tdap, Td) vaccine. You may need a Td booster every 10 years. Zoster vaccine. You may need this after age 5. Pneumococcal 13-valent conjugate (PCV13) vaccine. One dose is recommended after age 39. Pneumococcal polysaccharide (PPSV23) vaccine. One dose is recommended after age 23. Talk to your health care provider about which screenings and vaccines you need and how often you need them. This information is not intended to replace advice given to you by your health care provider. Make sure you discuss any questions you have with your health care provider. Document Released: 04/03/2015 Document Revised: 11/25/2015 Document Reviewed: 01/06/2015 Elsevier Interactive Patient Education  2017 [place] Prevention in the Home Falls can cause injuries. They can happen to people of all ages. There are many things you can do to make your home safe and to help prevent falls. What can I do on the outside of my home? Regularly fix the edges of walkways and driveways and fix any cracks. Remove anything that might make you trip as you walk through a door, such as a raised step or threshold. Trim any bushes or trees on the path to your home. Use bright outdoor lighting. Clear any walking paths of anything that might make someone trip, such as rocks or tools. Regularly check to see if handrails are loose or broken. Make sure that both sides of any steps have handrails. Any raised decks and porches should have guardrails on the edges. Have any leaves, snow, or ice cleared regularly. Use sand or salt on walking paths during winter. Clean up any spills in your garage right away. This includes oil or grease spills. What can I do in the bathroom? Use night lights. Install grab bars by the toilet and in the tub and shower. Do not use towel bars as grab bars. Use non-skid mats or decals in the tub or shower. If you  need to sit down in the shower, use a plastic, non-slip stool. Keep the floor dry. Clean up any water that spills on the floor as soon as it happens. Remove soap buildup in the tub or shower regularly. Attach bath mats securely with double-sided non-slip rug tape. Do not have throw rugs and other things on the floor that can make you trip. What can I do in the bedroom? Use night lights. Make sure that you have a light by your bed that is easy to reach. Do not use any sheets or blankets that are too big for your bed. They should not hang down onto the floor. Have a firm chair that has side arms. You can use this for support while you get dressed. Do not have throw rugs and other things on the floor that can make you trip. What can I do in the kitchen? Clean up any spills right away. Avoid walking on wet floors. Keep items that you use a lot in easy-to-reach places. If you need to reach something above you, use a strong step stool that has a grab bar. Keep electrical cords out of the way. Do not use floor polish  or wax that makes floors slippery. If you must use wax, use non-skid floor wax. Do not have throw rugs and other things on the floor that can make you trip. What can I do with my stairs? Do not leave any items on the stairs. Make sure that there are handrails on both sides of the stairs and use them. Fix handrails that are broken or loose. Make sure that handrails are as long as the stairways. Check any carpeting to make sure that it is firmly attached to the stairs. Fix any carpet that is loose or worn. Avoid having throw rugs at the top or bottom of the stairs. If you do have throw rugs, attach them to the floor with carpet tape. Make sure that you have a light switch at the top of the stairs and the bottom of the stairs. If you do not have them, ask someone to add them for you. What else can I do to help prevent falls? Wear shoes that: Do not have high heels. Have rubber  bottoms. Are comfortable and fit you well. Are closed at the toe. Do not wear sandals. If you use a stepladder: Make sure that it is fully opened. Do not climb a closed stepladder. Make sure that both sides of the stepladder are locked into place. Ask someone to hold it for you, if possible. Clearly mark and make sure that you can see: Any grab bars or handrails. First and last steps. Where the edge of each step is. Use tools that help you move around (mobility aids) if they are needed. These include: Canes. Walkers. Scooters. Crutches. Turn on the lights when you go into a dark area. Replace any light bulbs as soon as they burn out. Set up your furniture so you have a clear path. Avoid moving your furniture around. If any of your floors are uneven, fix them. If there are any pets around you, be aware of where they are. Review your medicines with your doctor. Some medicines can make you feel dizzy. This can increase your chance of falling. Ask your doctor what other things that you can do to help prevent falls. This information is not intended to replace advice given to you by your health care provider. Make sure you discuss any questions you have with your health care provider. Document Released: 01/01/2009 Document Revised: 08/13/2015 Document Reviewed: 04/11/2014 Elsevier Interactive Patient Education  2017 [employer].

## 2022-07-13 NOTE — Assessment & Plan Note (Signed)
Patients symptoms consistent with viral gastroenteritis. I encouraged her to push fluids and electrolytes as she may have some dehydration causing her to feel fatigued. I did clean some impacted wax out of her ears and those symptoms improved. Encouraged her to return to office if her symptoms persist or worsen but she is overall improving.

## 2022-07-13 NOTE — Progress Notes (Unsigned)
Subjective:   Taylor Taylor is a 77 y.o. female who presents for Medicare Annual (Subsequent) preventive examination.  Review of Systems    ***       Objective:    There were no vitals filed for this visit. There is no height or weight on file to calculate BMI.     08/05/2021   10:41 AM 07/23/2020    9:57 AM 12/24/2018    6:40 AM  Advanced Directives  Does Patient Have a Medical Advance Directive? Yes Yes Yes  Type of Estate agent of [place];Living will Healthcare Power of [place];Living will Healthcare Power of [place];Living will  Does patient want to make changes to medical advance directive?  No - Patient declined   Copy of Healthcare Power of Attorney in Chart? No - copy requested No - copy requested     Current Medications (verified) Outpatient Encounter Medications as of 07/14/2022  Medication Sig   acetaminophen (TYLENOL) 500 MG tablet Take 500 mg by mouth 2 (two) times daily.   alendronate (FOSAMAX) 70 MG tablet TAKE 1 TABLET BY MOUTH ONCE WEEKLY BEFORE THE FIRST FOOD, BEVERAGE OR MEDICINE OF THE DAY WITH PLAIN WATER   Calcium Carbonate-Vit D-Min (CALCIUM 1200 PO) Take 1 tablet by mouth in the morning and at bedtime.   cholecalciferol (VITAMIN D3) 25 MCG (1000 UNIT) tablet Take 125 mcg by mouth daily.   diclofenac (VOLTAREN) 75 MG EC tablet Take 1 tablet (75 mg total) by mouth 2 (two) times daily.   estradiol (ESTRACE) 1 MG tablet Take 1 tablet (1 mg total) by mouth daily.   ferrous sulfate 325 (65 FE) MG tablet Take 325 mg by mouth daily with breakfast.   Fish Oil-Cholecalciferol (FISH OIL + D3) 1200-1000 MG-UNIT CAPS Take 1 tablet by mouth daily.   Glucosamine-Chondroit-Vit C-Mn (GLUCOSAMINE CHONDR 1500 COMPLX) CAPS Take 1 capsule by mouth 2 (two) times daily.   Lifitegrast (XIIDRA) 5 % SOLN Apply to eye.   Multiple Vitamins-Minerals (WOMENS MULTI VITAMIN & MINERAL PO) Take 1 tablet by mouth daily.   progesterone (PROMETRIUM) 100 MG  capsule Take 1 capsule (100 mg total) by mouth daily.   Red Yeast Rice 600 MG CAPS Take 600 mg by mouth daily.    Turmeric 500 MG TABS Take 500 mg by mouth daily.    venlafaxine XR (EFFEXOR-XR) 150 MG 24 hr capsule TAKE ONE CAPSULE BY MOUTH EVERY MORNING   vitamin B-12 (CYANOCOBALAMIN) 100 MCG tablet Take 100 mcg by mouth daily.   vitamin E 100 UNIT capsule Take 100 Units by mouth daily.   Facility-Administered Encounter Medications as of 07/14/2022  Medication   0.9 %  sodium chloride infusion    Allergies (verified) Pollen extract   History: Past Medical History:  Diagnosis Date   Allergy    Seasonal   Arthritis    back   GERD (gastroesophageal reflux disease)    in past   Past Surgical History:  Procedure Laterality Date   BACK SURGERY     Scheduled for 08/19/2021., Dr, Caralee Ates.   CERVICAL DISC SURGERY     over 10 years   COLONOSCOPY     Family History  Problem Relation Age of Onset   Parkinson's disease Mother    Brain cancer Father    Breast cancer Neg Hx    Social History   Socioeconomic History   Marital status: Married    Spouse name: Not on file   Number of children: Not on file  Years of education: Not on file   Highest education level: Associate degree: occupational, Scientist, product/process development, or vocational program  Occupational History   Not on file  Tobacco Use   Smoking status: Never   Smokeless tobacco: Never  Vaping Use   Vaping Use: Never used  Substance and Sexual Activity   Alcohol use: No   Drug use: No   Sexual activity: Not on file  Other Topics Concern   Not on file  Social History Narrative   Not on file   Social Determinants of Health   Financial Resource Strain: Low Risk  (07/13/2022)   Overall Financial Resource Strain (CARDIA)    Difficulty of Paying Living Expenses: Not very hard  Food Insecurity: No Food Insecurity (07/13/2022)   Hunger Vital Sign    Worried About Running Out of Food in the Last Year: Never true    Ran Out of Food in  the Last Year: Never true  Transportation Needs: No Transportation Needs (07/13/2022)   PRAPARE - Administrator, Civil Service (Medical): No    Lack of Transportation (Non-Medical): No  Physical Activity: Sufficiently Active (07/13/2022)   Exercise Vital Sign    Days of Exercise per Week: 4 days    Minutes of Exercise per Session: 40 min  Stress: No Stress Concern Present (07/13/2022)   [employer] of Occupational Health - Occupational Stress Questionnaire    Feeling of Stress : Not at all  Social Connections: Socially Integrated (07/13/2022)   Social Connection and Isolation Panel [NHANES]    Frequency of Communication with Friends and Family: Three times a week    Frequency of Social Gatherings with Friends and Family: Three times a week    Attends Religious Services: More than 4 times per year    Active Member of Clubs or Organizations: Yes    Attends Banker Meetings: More than 4 times per year    Marital Status: Married    Tobacco Counseling Counseling given: Not Answered   Clinical Intake:              How often do you need to have someone help you when you read instructions, pamphlets, or other written materials from your doctor or pharmacy?: (P) 1 - Never  Diabetic?No          Activities of Daily Living    07/13/2022    1:03 PM 08/05/2021   10:43 AM  In your present state of health, do you have any difficulty performing the following activities:  Hearing? 0 0  Vision? 0 0  Difficulty concentrating or making decisions? 0 0  Walking or climbing stairs? 0 0  Dressing or bathing? 0 0  Doing errands, shopping? 0 0  Preparing Food and eating ? N N  Using the Toilet? N N  In the past six months, have you accidently leaked urine? Y Y  Do you have problems with loss of bowel control? N N  Managing your Medications? N N  Managing your Finances? N N  Housekeeping or managing your Housekeeping? N N    Patient Care Team: [name], MD as PCP - General (Family Medicine) Erroll Luna, [facility] as Pharmacist (Pharmacist)  Indicate any recent Medical Services you may have received from other than [facility] providers in the past year (date may be approximate).     Assessment:   This is a routine wellness examination for Arlo.  Hearing/Vision screen No results found.  Dietary issues and exercise  activities discussed:     Goals Addressed   None    Depression Screen    07/13/2022   11:20 AM 06/02/2022    3:01 PM 11/18/2021    9:18 AM 08/05/2021   10:39 AM 07/23/2020    9:59 AM 07/15/2019    3:00 PM 02/21/2018   11:38 AM  PHQ 2/9 Scores  PHQ - 2 Score 0 0 0 0 0 0 0  PHQ- 9 Score 3      4    Fall Risk    07/13/2022    1:03 PM 06/02/2022    3:01 PM 11/18/2021    9:18 AM 08/05/2021   10:42 AM 07/23/2020    9:59 AM  Fall Risk   Falls in the past year? 0 0 0 0 0  Number falls in past yr: 0 0 0 0 0  Injury with Fall? 0 0 0 0 0  Risk for fall due to :  No Fall Risks No Fall Risks Impaired balance/gait No Fall Risks  Follow up  Falls prevention discussed Falls prevention discussed Falls prevention discussed Falls evaluation completed;Falls prevention discussed    FALL RISK PREVENTION PERTAINING TO THE HOME:  Any stairs in or around the home? {YES/NO:21197} If so, are there any without handrails? {YES/NO:21197} Home free of loose throw rugs in walkways, pet beds, electrical cords, etc? {YES/NO:21197} Adequate lighting in your home to reduce risk of falls? {YES/NO:21197}  ASSISTIVE DEVICES UTILIZED TO PREVENT FALLS:  Life alert? {YES/NO:21197} Use of a cane, walker or w/c? {YES/NO:21197} Grab bars in the bathroom? {YES/NO:21197} Shower chair or bench in shower? {YES/NO:21197} Elevated toilet seat or a handicapped toilet? {YES/NO:21197}  TIMED UP AND GO:  Was the test performed? No . Telephonic visit   Cognitive Function:        08/05/2021   10:43 AM  6CIT Screen  What Year? 0 points  What  month? 0 points  What time? 0 points  Count back from 20 0 points  Months in reverse 4 points  Repeat phrase 0 points  Total Score 4 points    Immunizations Immunization History  Administered Date(s) Administered   Fluad Quad(high Dose 65+) 12/06/2018   Hepatitis A 12/10/1997, 09/17/1998   Hepatitis A, Ped/Adol-2 Dose 12/10/1997, 09/17/1998   Hepatitis B 12/10/1997, 01/09/1998, 08/11/1998   Hepatitis B, PED/ADOLESCENT 12/10/1997, 01/09/1998, 08/11/1998   Influenza Inj Mdck Quad With Preservative 11/19/2016, 11/20/2019   Influenza Split 01/18/2012   Influenza, High Dose Seasonal PF 12/22/2015, 10/27/2016, 12/15/2017, 12/06/2018   Influenza,inj,Quad PF,6+ Mos 01/01/2013, 01/07/2014, 12/30/2014   Influenza-Unspecified 10/27/2016, 12/23/2020, 12/19/2021   PFIZER(Purple Top)SARS-COV-2 Vaccination 04/12/2019, 05/02/2019, 07/20/2020   Pfizer Covid-19 Vaccine Bivalent Booster 60yrs & up 12/23/2020   Pneumococcal Conjugate-13 07/17/2014   Pneumococcal Polysaccharide-23 07/12/2011   Td 12/09/1997   Tdap 07/12/2011   Zoster Recombinat (Shingrix) 08/12/2016, 12/24/2016   Zoster, Live 02/26/2010    TDAP status: Due, Education has been provided regarding the importance of this vaccine. Advised may receive this vaccine at local pharmacy or Health Dept. Aware to provide a copy of the vaccination record if obtained from local pharmacy or Health Dept. Verbalized acceptance and understanding.  Flu Vaccine status: Up to date  Pneumococcal vaccine status: Up to date  Covid-19 vaccine status: Information provided on how to obtain vaccines.   Qualifies for Shingles Vaccine? Yes   Zostavax completed No   Shingrix Completed?: Yes  Screening Tests Health Maintenance  Topic Date Due   COVID-19 Vaccine (5 - 2023-24 season)  11/19/2021   INFLUENZA VACCINE  10/20/2022   MAMMOGRAM  11/04/2022   Medicare Annual Wellness (AWV)  06/02/2023   Pneumonia Vaccine 85+ Years old  Completed   DEXA SCAN   Completed   Hepatitis C Screening  Completed   Zoster Vaccines- Shingrix  Completed   HPV VACCINES  Aged Out   DTaP/Tdap/Td  Discontinued    Health Maintenance  Health Maintenance Due  Topic Date Due   COVID-19 Vaccine (5 - 2023-24 season) 11/19/2021    Colorectal cancer screening: No longer required.   Mammogram status: Completed 11/03/21. Repeat every year  Bone Density status: Completed 10/28/20. Results reflect: Bone density results: OSTEOPENIA. Repeat every 2 years.  Lung Cancer Screening: (Low Dose CT Chest recommended if Age 60-80 years, 30 pack-year currently smoking OR have quit w/in 15years.) does not qualify.   Lung Cancer Screening Referral: n/a  Additional Screening:  Hepatitis C Screening: does qualify; Completed 11/12/15  Vision Screening: Recommended annual ophthalmology exams for early detection of glaucoma and other disorders of the eye. Is the patient up to date with their annual eye exam?  {YES/NO:21197} Who is the provider or what is the name of the office in which the patient attends annual eye exams? *** If pt is not established with a provider, would they like to be referred to a provider to establish care? {YES/NO:21197}.   Dental Screening: Recommended annual dental exams for proper oral hygiene  Community Resource Referral / Chronic Care Management: CRR required this visit?  {YES/NO:21197}  CCM required this visit?  {YES/NO:21197}     Plan:     I have personally reviewed and noted the following in the patient's chart:   Medical and social history Use of alcohol, tobacco or illicit drugs  Current medications and supplements including opioid prescriptions. {Opioid Prescriptions:7753911522} Functional ability and status Nutritional status Physical activity Advanced directives List of other physicians Hospitalizations, surgeries, and ER visits in previous 12 months Vitals Screenings to include cognitive, depression, and falls Referrals and  appointments  In addition, I have reviewed and discussed with patient certain preventive protocols, quality metrics, and best practice recommendations. A written personalized care plan for preventive services as well as general preventive health recommendations were provided to patient.     Durwin Nora, [place]   7/82/9562   Due to this being a virtual visit, the after visit summary with patients personalized plan was offered to patient via mail or my-chart. ***Patient declined at this time./ Patient would like to access on my-chart/ per request, patient was mailed a copy of AVS./ Patient preferred to pick up at office at next visit   Nurse Notes: ***

## 2022-07-14 ENCOUNTER — Ambulatory Visit (INDEPENDENT_AMBULATORY_CARE_PROVIDER_SITE_OTHER): Payer: PPO

## 2022-07-14 VITALS — Ht 63.0 in | Wt 124.0 lb

## 2022-07-14 DIAGNOSIS — Z0000 Encounter for general adult medical examination without abnormal findings: Secondary | ICD-10-CM

## 2022-07-15 ENCOUNTER — Other Ambulatory Visit: Payer: Self-pay

## 2022-07-15 DIAGNOSIS — R413 Other amnesia: Secondary | ICD-10-CM

## 2022-07-15 MED ORDER — VENLAFAXINE HCL ER 37.5 MG PO CP24: 75.0000 mg | ORAL_CAPSULE | Freq: Every day | ORAL | 0 refills | Status: DC

## 2022-07-15 MED ORDER — VENLAFAXINE HCL ER 37.5 MG PO CP24: 37.5000 mg | ORAL_CAPSULE | Freq: Every day | ORAL | 0 refills | Status: DC

## 2022-07-19 ENCOUNTER — Encounter: Payer: Self-pay | Admitting: Family Medicine

## 2022-07-19 ENCOUNTER — Encounter: Payer: Self-pay | Admitting: Physician Assistant

## 2022-07-19 ENCOUNTER — Ambulatory Visit (INDEPENDENT_AMBULATORY_CARE_PROVIDER_SITE_OTHER): Payer: PPO | Admitting: Family Medicine

## 2022-07-19 VITALS — BP 120/62 | HR 82 | Temp 98.4°F | Ht 63.0 in | Wt 132.0 lb

## 2022-07-19 DIAGNOSIS — R413 Other amnesia: Secondary | ICD-10-CM | POA: Diagnosis not present

## 2022-07-19 DIAGNOSIS — R5383 Other fatigue: Secondary | ICD-10-CM

## 2022-07-19 LAB — CBC WITH DIFFERENTIAL/PLATELET
Basophils Absolute: 52 cells/uL (ref 0–200)
HCT: 38 % (ref 35.0–45.0)
MCHC: 33.7 g/dL (ref 32.0–36.0)

## 2022-07-19 NOTE — Progress Notes (Signed)
Subjective:    Patient ID: Taylor Taylor, female    DOB: [birth year], 77 y.o.   MRN: [id number]  Diarrhea     07/2021 Over the last year or so, have seen the patient multiple times with fatigue.  Today, CBC, CMP, TSH, B12 levels involving normal.  Her colonoscopy and mammograms are up-to-date.  There has not been an explanation for her fatigue.  Now she is dealing with severe low back pain.  She has spinal stenosis and her neurosurgeon is planning surgery.  She is not sleeping well due to the pain in her back.  This could be contributing to her fatigue however the fatigue predated the trouble with her back.  She is also worried a tremendous amount about her sister who has failing health.  Therefore there is definitely stress in her life however she does not consider herself depressed.  She still sees GYN.  Therefore the question is whether she continued to have severe fatigue.  She denies any fevers or chills or weight loss.  She does have some nausea.  She is having some diarrhea.  She also has right lower quadrant abdominal pain.  She states the pain is intense and has been getting worse over the last week or so.  She denies any vaginal bleeding.  She denies any hematuria.  She denies any melena or hematochezia.  At that time, my plan   I am concerned about the right lower quadrant abdominal pain.  I will start by obtaining CT scan given the worsening pain over the last week and the severe fatigue.  If the CT scan of the abdomen and pelvis is normal, I feel the best thorough diagnostic test to rule out any occult malignancy or infection.  I will repeat a CBC a CMP and a sedimentation rate.  If the sed rate is normal I feel that that would rule out autoimmune diseases along with normal labs.  I will try the patient on Valium 5 mg p.o. nightly to see if the insomnia could be contributing to her fatigue.  If none of this is helping I would recommend trying Provigil for chronic fatigue syndrome.  Await  the results of the CT scan and the lab work  11/18/21 Patient is here today for follow-up.  CT scan of the abdomen pelvis was unremarkable in May.  Patient continues to endorse severe fatigue despite an extensive work-up.  Labs have been unremarkable on several occasions.  CT scan was unremarkable.  Colonoscopy was negative in 2018.  Essentially the patient reports lack of energy.  We have even tried venlafaxine for possible depression and Provigil for chronic fatigue syndrome however the patient took the Provigil for over a month and saw no benefit despite taking 200 mg a day.  She continues to endorse severe fatigue.  She states that on a daily basis she has very little energy even to get off the couch or do anything.  Her weight remains relatively stable.  Her weight is down 3 pounds from May.  She denies any chest pain.  She denies any shortness of breath.  She had an echocardiogram in February that was normal.  She denies any cough or pleurisy or hemoptysis.  She denies any nausea or vomiting.  She does occasionally have diarrhea once every 2 weeks or so but this is not consistent or heavy.  She denies any joint pains or rash or muscle pains.  At that time, my plan was: At this point, I  do not know what else could be causing her fatigue.  Her diagnostic work-up has been negative today.  Symptoms have remained relatively stable and have been present since prior to December 2021.  Therefore I do not feel that there is a serious life-threatening illness.  I have not screened the patient for adrenal insufficiency.  I will check a baseline cortisol level and will consult endocrinology to see if there is any other hormonal issues that may be causing fatigue that I may not be considering.    07/19/22 When I last saw the patient, she endorsed memory loss.  I recommended neuropsychological testing.  However she does have an appointment till December.  He recently manage she can stop Effexor because she was  concerned that this may become a loss.  I recommended to the patient how she can safely wean off the Effexor.  However the patient accidentally stopped the medication 2 weeks ago.  She is not sure why.  Ever since she stopped the medication she has been having diarrhea and loose stools.  She continues to endorse feeling weak and tired.  This is been a chronic complaint for her for several years.  She is obviously very sad and frustrated that she is not feeling any better.  I am very concerned because her memory seems to be getting worse.  She also seems to be more confused today.   Past Medical History:  Diagnosis Date   Allergy    Seasonal   Arthritis    back   GERD (gastroesophageal reflux disease)    in past   Past Surgical History:  Procedure Laterality Date   BACK SURGERY     Scheduled for 08/19/2021., Dr, Caralee Ates.   CERVICAL DISC SURGERY     over 10 years   COLONOSCOPY     Current Outpatient Medications on File Prior to Visit  Medication Sig Dispense Refill   acetaminophen (TYLENOL) 500 MG tablet Take 500 mg by mouth 2 (two) times daily.     alendronate (FOSAMAX) 70 MG tablet TAKE 1 TABLET BY MOUTH ONCE WEEKLY BEFORE THE FIRST FOOD, BEVERAGE OR MEDICINE OF THE DAY WITH PLAIN WATER 12 tablet 3   Calcium Carbonate-Vit D-Min (CALCIUM 1200 PO) Take 1 tablet by mouth in the morning and at bedtime.     cholecalciferol (VITAMIN D3) 25 MCG (1000 UNIT) tablet Take 125 mcg by mouth daily.     diclofenac (VOLTAREN) 75 MG EC tablet Take 1 tablet (75 mg total) by mouth 2 (two) times daily. 90 tablet 2   estradiol (ESTRACE) 1 MG tablet Take 1 tablet (1 mg total) by mouth daily. 90 tablet 3   ferrous sulfate 325 (65 FE) MG tablet Take 325 mg by mouth daily with breakfast.     Fish Oil-Cholecalciferol (FISH OIL + D3) 1200-1000 MG-UNIT CAPS Take 1 tablet by mouth daily.     Glucosamine-Chondroit-Vit C-Mn (GLUCOSAMINE CHONDR 1500 COMPLX) CAPS Take 1 capsule by mouth 2 (two) times daily.      Lifitegrast (XIIDRA) 5 % SOLN Apply to eye.     Multiple Vitamins-Minerals (WOMENS MULTI VITAMIN & MINERAL PO) Take 1 tablet by mouth daily.     progesterone (PROMETRIUM) 100 MG capsule Take 1 capsule (100 mg total) by mouth daily. 90 capsule 3   Red Yeast Rice 600 MG CAPS Take 600 mg by mouth daily.      Turmeric 500 MG TABS Take 500 mg by mouth daily.      venlafaxine XR [facility]  XR) 37.5 MG 24 hr capsule Take 2 capsules (75 mg total) by mouth daily with breakfast. For 2 weeks. 28 capsule 0   vitamin B-12 (CYANOCOBALAMIN) 100 MCG tablet Take 100 mcg by mouth daily.     vitamin E 100 UNIT capsule Take 100 Units by mouth daily.     venlafaxine XR (EFFEXOR XR) 37.5 MG 24 hr capsule Take 1 capsule (37.5 mg total) by mouth daily with breakfast. For 2 weeks, then stop. (Patient not taking: Reported on 07/19/2022) 14 capsule 0   Current Facility-Administered Medications on File Prior to Visit  Medication Dose Route Frequency Provider Last Rate Last Admin   0.9 %  sodium chloride infusion  500 mL Intravenous Continuous Pyrtle, Carie Caddy, MD       Allergies  Allergen Reactions   Pollen Extract Itching    sneezing   Social History   Socioeconomic History   Marital status: Married    Spouse name: Not on file   Number of children: Not on file   Years of education: Not on file   Highest education level: Associate degree: occupational, Scientist, product/process development, or vocational program  Occupational History   Not on file  Tobacco Use   Smoking status: Never   Smokeless tobacco: Never  Vaping Use   Vaping Use: Never used  Substance and Sexual Activity   Alcohol use: No   Drug use: No   Sexual activity: Not on file  Other Topics Concern   Not on file  Social History Narrative   Not on file   Social Determinants of Health   Financial Resource Strain: Low Risk  (07/14/2022)   Overall Financial Resource Strain (CARDIA)    Difficulty of Paying Living Expenses: Not hard at all  Food Insecurity: No Food  Insecurity (07/14/2022)   Hunger Vital Sign    Worried About Running Out of Food in the Last Year: Never true    Ran Out of Food in the Last Year: Never true  Transportation Needs: No Transportation Needs (07/14/2022)   PRAPARE - Administrator, Civil Service (Medical): No    Lack of Transportation (Non-Medical): No  Physical Activity: Sufficiently Active (07/14/2022)   Exercise Vital Sign    Days of Exercise per Week: 4 days    Minutes of Exercise per Session: 40 min  Stress: No Stress Concern Present (07/14/2022)   [employer] of Occupational Health - Occupational Stress Questionnaire    Feeling of Stress : Not at all  Social Connections: Socially Integrated (07/14/2022)   Social Connection and Isolation Panel [NHANES]    Frequency of Communication with Friends and Family: Three times a week    Frequency of Social Gatherings with Friends and Family: More than three times a week    Attends Religious Services: More than 4 times per year    Active Member of [employer] or Organizations: Yes    Attends Engineer, structural: More than 4 times per year    Marital Status: Married  Catering manager Violence: Not At Risk (07/14/2022)   Humiliation, Afraid, Rape, and Kick questionnaire    Fear of Current or Ex-Partner: No    Emotionally Abused: No    Physically Abused: No    Sexually Abused: No    Past Medical History:  Diagnosis Date   Allergy    Seasonal   Arthritis    back   GERD (gastroesophageal reflux disease)    in past   Past Surgical History:  Procedure Laterality Date  BACK SURGERY     Scheduled for 08/19/2021., Dr, Caralee Ates.   CERVICAL DISC SURGERY     over 10 years   COLONOSCOPY     Current Outpatient Medications on File Prior to Visit  Medication Sig Dispense Refill   acetaminophen (TYLENOL) 500 MG tablet Take 500 mg by mouth 2 (two) times daily.     alendronate (FOSAMAX) 70 MG tablet TAKE 1 TABLET BY MOUTH ONCE WEEKLY BEFORE THE FIRST FOOD,  BEVERAGE OR MEDICINE OF THE DAY WITH PLAIN WATER 12 tablet 3   Calcium Carbonate-Vit D-Min (CALCIUM 1200 PO) Take 1 tablet by mouth in the morning and at bedtime.     cholecalciferol (VITAMIN D3) 25 MCG (1000 UNIT) tablet Take 125 mcg by mouth daily.     diclofenac (VOLTAREN) 75 MG EC tablet Take 1 tablet (75 mg total) by mouth 2 (two) times daily. 90 tablet 2   estradiol (ESTRACE) 1 MG tablet Take 1 tablet (1 mg total) by mouth daily. 90 tablet 3   ferrous sulfate 325 (65 FE) MG tablet Take 325 mg by mouth daily with breakfast.     Fish Oil-Cholecalciferol (FISH OIL + D3) 1200-1000 MG-UNIT CAPS Take 1 tablet by mouth daily.     Glucosamine-Chondroit-Vit C-Mn (GLUCOSAMINE CHONDR 1500 COMPLX) CAPS Take 1 capsule by mouth 2 (two) times daily.     Lifitegrast (XIIDRA) 5 % SOLN Apply to eye.     Multiple Vitamins-Minerals (WOMENS MULTI VITAMIN & MINERAL PO) Take 1 tablet by mouth daily.     progesterone (PROMETRIUM) 100 MG capsule Take 1 capsule (100 mg total) by mouth daily. 90 capsule 3   Red Yeast Rice 600 MG CAPS Take 600 mg by mouth daily.      Turmeric 500 MG TABS Take 500 mg by mouth daily.      venlafaxine XR (EFFEXOR XR) 37.5 MG 24 hr capsule Take 2 capsules (75 mg total) by mouth daily with breakfast. For 2 weeks. 28 capsule 0   vitamin B-12 (CYANOCOBALAMIN) 100 MCG tablet Take 100 mcg by mouth daily.     vitamin E 100 UNIT capsule Take 100 Units by mouth daily.     venlafaxine XR (EFFEXOR XR) 37.5 MG 24 hr capsule Take 1 capsule (37.5 mg total) by mouth daily with breakfast. For 2 weeks, then stop. (Patient not taking: Reported on 07/19/2022) 14 capsule 0   Current Facility-Administered Medications on File Prior to Visit  Medication Dose Route Frequency Provider Last Rate Last Admin   0.9 %  sodium chloride infusion  500 mL Intravenous Continuous Pyrtle, Carie Caddy, MD       Allergies  Allergen Reactions   Pollen Extract Itching    sneezing   Social History   Socioeconomic History    Marital status: Married    Spouse name: Not on file   Number of children: Not on file   Years of education: Not on file   Highest education level: Associate degree: occupational, Scientist, product/process development, or vocational program  Occupational History   Not on file  Tobacco Use   Smoking status: Never   Smokeless tobacco: Never  Vaping Use   Vaping Use: Never used  Substance and Sexual Activity   Alcohol use: No   Drug use: No   Sexual activity: Not on file  Other Topics Concern   Not on file  Social History Narrative   Not on file   Social Determinants of Health   Financial Resource Strain: Low Risk  (07/14/2022)  Overall Financial Resource Strain (CARDIA)    Difficulty of Paying Living Expenses: Not hard at all  Food Insecurity: No Food Insecurity (07/14/2022)   Hunger Vital Sign    Worried About Running Out of Food in the Last Year: Never true    Ran Out of Food in the Last Year: Never true  Transportation Needs: No Transportation Needs (07/14/2022)   PRAPARE - Administrator, Civil Service (Medical): No    Lack of Transportation (Non-Medical): No  Physical Activity: Sufficiently Active (07/14/2022)   Exercise Vital Sign    Days of Exercise per Week: 4 days    Minutes of Exercise per Session: 40 min  Stress: No Stress Concern Present (07/14/2022)   [employer] of Occupational Health - Occupational Stress Questionnaire    Feeling of Stress : Not at all  Social Connections: Socially Integrated (07/14/2022)   Social Connection and Isolation Panel [NHANES]    Frequency of Communication with Friends and Family: Three times a week    Frequency of Social Gatherings with Friends and Family: More than three times a week    Attends Religious Services: More than 4 times per year    Active Member of [employer] or Organizations: Yes    Attends Banker Meetings: More than 4 times per year    Marital Status: Married  Catering manager Violence: Not At Risk (07/14/2022)    Humiliation, Afraid, Rape, and Kick questionnaire    Fear of Current or Ex-Partner: No    Emotionally Abused: No    Physically Abused: No    Sexually Abused: No      Review of Systems  Gastrointestinal:  Positive for diarrhea.  All other systems reviewed and are negative.      Objective:   Physical Exam Vitals reviewed.  Constitutional:      General: She is not in acute distress.    Appearance: She is well-developed. She is not diaphoretic.  HENT:     Head: Normocephalic and atraumatic.     Right Ear: External ear normal.     Left Ear: External ear normal.     Nose: Nose normal.     Mouth/Throat:     Pharynx: No oropharyngeal exudate.  Eyes:     Conjunctiva/sclera: Conjunctivae normal.     Pupils: Pupils are equal, round, and reactive to light.  Neck:     Thyroid: No thyromegaly.     Vascular: No JVD.     Trachea: No tracheal deviation.  Cardiovascular:     Rate and Rhythm: Normal rate and regular rhythm.     Heart sounds: Normal heart sounds. No murmur heard.    No friction rub. No gallop.  Pulmonary:     Effort: Pulmonary effort is normal. No respiratory distress.     Breath sounds: Normal breath sounds. No wheezing or rales.  Chest:     Chest wall: No tenderness.  Abdominal:     General: Abdomen is flat. Bowel sounds are normal. There is no distension.     Palpations: Abdomen is soft. There is no mass.     Tenderness: There is abdominal tenderness in the right lower quadrant. There is no guarding or rebound.  Musculoskeletal:        General: No tenderness. Normal range of motion.     Cervical back: Neck supple.  Lymphadenopathy:     Cervical: No cervical adenopathy.  Skin:    Findings: No rash.  Neurological:     Mental Status: She  is alert and oriented to person, place, and time.     Cranial Nerves: No cranial nerve deficit.     Motor: No abnormal muscle tone.     Coordination: Coordination normal.     Deep Tendon Reflexes: Reflexes normal.            Assessment & Plan:  Fatigue, unspecified type - Plan: Cortisol-am, blood, CBC with Differential/Platelet, BASIC METABOLIC PANEL WITH GFR  Memory loss  First am concerned that the diarrhea may be a medication withdrawal side effects from abruptly discontinuing Effexor.  I recommend taking 75 mg daily for 1 week and then increasing to 75 mg a day 1 week and stopping the medication and seeing how she does.  I do not believe that the Effexor is causing her memory loss but the Effexor did not help with fatigue.  I was treating her empirically for possible depression as a cause of her fatigue and she has seen no benefit.  I also treated the patient empirically for chronic fatigue syndrome with Provigil and the patient saw no benefit.  I will repeat lab work including cortisol level to evaluate for adrenal insufficiency, CBC, and a BMP.  I am also going to put in a referral to neurology.  I am concerned that the patient is demonstrating signs of dementia.  I believe that we can get her in sooner with neurology than December

## 2022-07-20 LAB — CBC WITH DIFFERENTIAL/PLATELET
Absolute Monocytes: 583 cells/uL (ref 200–950)
Basophils Relative: 0.6 %
Eosinophils Absolute: 183 cells/uL (ref 15–500)
Eosinophils Relative: 2.1 %
Hemoglobin: 12.8 g/dL (ref 11.7–15.5)
Lymphs Abs: 2506 cells/uL (ref 850–3900)
MCH: 31.1 pg (ref 27.0–33.0)
MCV: 92.5 fL (ref 80.0–100.0)
MPV: 9.6 fL (ref 7.5–12.5)
Monocytes Relative: 6.7 %
Neutro Abs: 5377 cells/uL (ref 1500–7800)
Neutrophils Relative %: 61.8 %
Platelets: 292 10*3/uL (ref 140–400)
RBC: 4.11 10*6/uL (ref 3.80–5.10)
RDW: 12 % (ref 11.0–15.0)
Total Lymphocyte: 28.8 %
WBC: 8.7 10*3/uL (ref 3.8–10.8)

## 2022-07-20 LAB — BASIC METABOLIC PANEL WITH GFR
BUN: 24 mg/dL (ref 7–25)
CO2: 26 mmol/L (ref 20–32)
Calcium: 9.5 mg/dL (ref 8.6–10.4)
Chloride: 99 mmol/L (ref 98–110)
Creat: 0.88 mg/dL (ref 0.60–1.00)
Glucose, Bld: 108 mg/dL — ABNORMAL HIGH (ref 65–99)
Potassium: 4.7 mmol/L (ref 3.5–5.3)
Sodium: 134 mmol/L — ABNORMAL LOW (ref 135–146)
eGFR: 68 mL/min/{1.73_m2} (ref >=60)

## 2022-07-20 LAB — CORTISOL-AM, BLOOD: Cortisol - AM: 11.9 ug/dL

## 2022-08-01 ENCOUNTER — Other Ambulatory Visit: Payer: Self-pay

## 2022-08-01 DIAGNOSIS — R413 Other amnesia: Secondary | ICD-10-CM

## 2022-08-01 NOTE — Telephone Encounter (Signed)
Requested medication (s) are due for refill today: yes   Requested medication (s) are on the active medication list: yes   Last refill:  07/15/22 #14 0 refills  Future visit scheduled: no   Notes to clinic:  tapered drug . Do you want to refill Rx?     Requested Prescriptions  Pending Prescriptions Disp Refills   venlafaxine XR (EFFEXOR XR) 37.5 MG 24 hr capsule 14 capsule 0    Sig: Take 1 capsule (37.5 mg total) by mouth daily with breakfast. For 2 weeks, then stop.     Psychiatry: Antidepressants - SNRI - desvenlafaxine & venlafaxine Failed - 08/01/2022  9:44 AM      Failed - Valid encounter within last 6 months    Recent Outpatient Visits           1 year ago Right lower quadrant abdominal pain   [facility] Pickard, Priscille Heidelberg, MD   1 year ago Viral gastroenteritis   [facility] Tanya Nones, Priscille Heidelberg, MD   1 year ago Viral gastroenteritis   [facility] Tanya Nones, Priscille Heidelberg, MD   1 year ago Osteopenia, unspecified location   [facility] Pickard, Priscille Heidelberg, MD   2 years ago Chronic fatigue   [facility] Pickard, Priscille Heidelberg, MD       Future Appointments             In 2 days Marcos Eke, PA-C [facility]            Failed - Lipid Panel in normal range within the last 12 months    Cholesterol  Date Value Ref Range Status  06/02/2022 203 (H) <200 mg/dL Final   LDL Cholesterol (Calc)  Date Value Ref Range Status  06/02/2022 107 (H) mg/dL (calc) Final    Comment:    Reference range: <100 . Desirable range <100 mg/dL for primary prevention;   <70 mg/dL for patients with CHD or diabetic patients  with > or = 2 CHD risk factors. Marland Kitchen LDL-C is now calculated using the Martin-Hopkins  calculation, which is a validated novel method providing  better accuracy than the Friedewald equation in the  estimation of LDL-C.  Horald Pollen et al. Lenox Ahr. 1610;960(45): 2061-2068   (http://education.[facility].com/faq/FAQ164)    HDL  Date Value Ref Range Status  06/02/2022 79 > OR = 50 mg/dL Final   Triglycerides  Date Value Ref Range Status  06/02/2022 80 <150 mg/dL Final         Passed - Cr in normal range and within 360 days    Creat  Date Value Ref Range Status  07/19/2022 0.88 0.60 - 1.00 mg/dL Final         Passed - Last BP in normal range    BP Readings from Last 1 Encounters:  07/19/22 120/62

## 2022-08-01 NOTE — Telephone Encounter (Signed)
Prescription Request  08/01/2022  LOV: 07/19/22  What is the name of the medication or equipment? venlafaxine XR (EFFEXOR XR) 37.5 MG 24 hr capsule  Have you contacted your pharmacy to request a refill? Yes   Which pharmacy would you like this sent to?  [id number] [place], [place] - [address] [place] [address] Phone: [phone] Fax: [phone]    Patient notified that their request is being sent to the clinical staff for review and that they should receive a response within 2 business days.   Please advise at [facility] [phone]

## 2022-08-02 MED ORDER — VENLAFAXINE HCL ER 37.5 MG PO CP24
37.5000 mg | ORAL_CAPSULE | Freq: Every day | ORAL | 0 refills | Status: DC
Start: 2022-08-02 — End: 2023-01-17

## 2022-08-03 ENCOUNTER — Ambulatory Visit: Payer: PPO | Admitting: Physician Assistant

## 2022-08-03 ENCOUNTER — Ambulatory Visit: Payer: PPO

## 2022-08-05 DIAGNOSIS — H04123 Dry eye syndrome of bilateral lacrimal glands: Secondary | ICD-10-CM | POA: Diagnosis not present

## 2022-08-05 DIAGNOSIS — H2511 Age-related nuclear cataract, right eye: Secondary | ICD-10-CM | POA: Diagnosis not present

## 2022-08-05 DIAGNOSIS — H5201 Hypermetropia, right eye: Secondary | ICD-10-CM | POA: Diagnosis not present

## 2022-08-09 ENCOUNTER — Encounter: Payer: Self-pay | Admitting: Family Medicine

## 2022-08-09 ENCOUNTER — Other Ambulatory Visit: Payer: Self-pay

## 2022-08-09 NOTE — Telephone Encounter (Signed)
Prescription Request  08/09/2022  LOV: 07/19/22  What is the name of the medication or equipment? alendronate (FOSAMAX) 70 MG tablet [[id number]]  Have you contacted your pharmacy to request a refill? Yes   Which pharmacy would you like this sent to?  [id number] [place], [place] - [address] [place] [address] Phone: [phone] Fax: [phone]    Patient notified that their request is being sent to the clinical staff for review and that they should receive a response within 2 business days.   Please advise at [facility] [phone]

## 2022-08-10 NOTE — Telephone Encounter (Signed)
Requested medication (s) are due for refill today: yes   Requested medication (s) are on the active medication list: yes   Last refill:  11/16/21 #12 3  Future visit scheduled: no  Notes to clinic:  last OV 07/19/22. Protocol failed due to no labs for Mg, phosphate, last Vit D 06/12/2015. Do you want to refill Rx?     Requested Prescriptions  Pending Prescriptions Disp Refills   alendronate (FOSAMAX) 70 MG tablet 12 tablet 3    Sig: Take with a full glass of water on an empty stomach.     Endocrinology:  Bisphosphonates Failed - 08/09/2022  2:52 PM      Failed - Vitamin D in normal range and within 360 days    Vit D, 25-Hydroxy  Date Value Ref Range Status  11/12/2015 63 30 - 100 ng/mL Final    Comment:    Vitamin D Status           25-OH Vitamin D        Deficiency                <20 ng/mL        Insufficiency         20 - 29 ng/mL        Optimal             > or = 30 ng/mL   For 25-OH Vitamin D testing on patients on D2-supplementation and patients for whom quantitation of D2 and D3 fractions is required, the [facility] 25-OH VIT D, (D2,D3), LC/MS/MS is recommended: order code 96045 (patients > 2 yrs).          Failed - Mg Level in normal range and within 360 days    No results found for: "MG"       Failed - Phosphate in normal range and within 360 days    No results found for: "PHOS"       Failed - Valid encounter within last 12 months    Recent Outpatient Visits           1 year ago Right lower quadrant abdominal pain   [facility] Tanya Nones, Priscille Heidelberg, MD   1 year ago Viral gastroenteritis   [facility] Pickard, Priscille Heidelberg, MD   1 year ago Viral gastroenteritis   [facility] Tanya Nones, Priscille Heidelberg, MD   1 year ago Osteopenia, unspecified location   [facility] Pickard, Priscille Heidelberg, MD   2 years ago Chronic fatigue   [facility] Pickard, Priscille Heidelberg, MD       Future Appointments              In 2 days Elwyn Reach [facility]            Passed - Ca in normal range and within 360 days    Calcium  Date Value Ref Range Status  07/19/2022 9.5 8.6 - 10.4 mg/dL Final         Passed - Cr in normal range and within 360 days    Creat  Date Value Ref Range Status  07/19/2022 0.88 0.60 - 1.00 mg/dL Final         Passed - eGFR is 30 or above and within 360 days    GFR, Est African American  Date Value Ref Range Status  07/20/2020 83 > OR = 60 mL/min/1.74m2 Final   GFR, Est Non  African American  Date Value Ref Range Status  07/20/2020 72 > OR = 60 mL/min/1.79m2 Final   eGFR  Date Value Ref Range Status  07/19/2022 68 > OR = 60 mL/min/1.80m2 Final         Passed - Bone Mineral Density or Dexa Scan completed in the last 2 years

## 2022-08-12 ENCOUNTER — Ambulatory Visit: Payer: PPO

## 2022-08-12 ENCOUNTER — Ambulatory Visit: Payer: PPO | Admitting: Physician Assistant

## 2022-08-12 ENCOUNTER — Other Ambulatory Visit (INDEPENDENT_AMBULATORY_CARE_PROVIDER_SITE_OTHER): Payer: PPO

## 2022-08-12 ENCOUNTER — Encounter: Payer: Self-pay | Admitting: Physician Assistant

## 2022-08-12 VITALS — BP 118/49 | Resp 20 | Ht 63.0 in | Wt 131.0 lb

## 2022-08-12 DIAGNOSIS — R413 Other amnesia: Secondary | ICD-10-CM

## 2022-08-12 LAB — VITAMIN B12: Vitamin B-12: 1081 pg/mL — ABNORMAL HIGH (ref 211–911)

## 2022-08-12 LAB — TSH: TSH: 1.42 u[IU]/mL (ref 0.35–5.50)

## 2022-08-12 MED ORDER — ALENDRONATE SODIUM 70 MG PO TABS: ORAL_TABLET | ORAL | 3 refills | Status: DC

## 2022-08-12 NOTE — Progress Notes (Signed)
B12 is therapeutic, no need to replenish. Thyroid levels normal. Follow these with PCP, thanks

## 2022-08-12 NOTE — Patient Instructions (Signed)

## 2022-08-12 NOTE — Progress Notes (Signed)
Assessment/Plan:    The patient is seen in neurologic consultation at the request of [name], MD for the evaluation of memory.  Taylor Taylor is a very pleasant 77 y.o. year old RH female with a history of iron deficiency anemia, seen today for evaluation of memory loss. MoCA today is 24/30.     She is able to perform her ADLs without difficulty, and is able to drive without getting lost.  Workup is currently in progress.   Memory Impairment of unclear etiology  MRI brain without contrast to assess for underlying structural abnormality and assess vascular load  Neurocognitive testing to further evaluate cognitive concerns and determine other underlying cause of memory changes, including potential contribution from sleep, anxiety, or depression  Check B12, TSH Consider discontinuing hormonal replacement , recommend discussing with Gyn (usually not recommended to take it after 5 years)  Folllow up in 1 month   Subjective:    The patient is here alone.     How long did patient have memory difficulties?  For the last 6 to 8 months, the patient reports having difficulty remembering people's names.  She denies any difficulties with recent conversations.  She was taking Effexor during the period of time, when presenting to her PCP with complaints of "not feeling quite well ", but after reading the side effects of the medication, which was included memory difficulties, she discontinued it 6 to 8 weeks ago.  She continues to have some difficulty with names.  She tries to stay busy, volunteers at church, she also volunteers at [employer], has a good social life and remains active.  She does not do any crossword puzzles or word finding however.     repeats oneself?  Denies. Disoriented when walking into a room?  Patient denies  Leaving objects in unusual places?   denies   Wandering behavior? denies   Any personality changes ? " I feel I am more on edge than before" Any history  of depression?: denies.  She may have been "on edge ", for which she received Effexor 150 mg daily, but she denies actual diagnosis of depression "I may feel anxious at times, but I do not have reason to be depressed, and quite a happy person ".  She never sought psychotherapy for anxiety. Hallucinations or paranoia?  denies   Seizures? denies   Any sleep changes?  Sleeps well.  "Always had vivid dreams ", denies REM behavior or sleepwalking   Sleep apnea? denies   Any hygiene concerns?  denies   Independent of bathing and dressing?  Endorsed  Does the patient need help with medications? Patient is in charge   Who is in charge of the finances?  Patient is in charge     Any changes in appetite?   I may be eating more.  Patient have trouble swallowing?  denies   Does the patient cook? Yes.   Any kitchen accidents such as leaving the stove on? Patient denies   Any headaches?  denies   Chronic back pain?   Has arthritis, has received injections in the past   Ambulates with difficulty? denies   Recent falls or head injuries? denies     Vision changes? Unilateral weakness, numbness or tingling?  denies   Any tremors?  denies   Any anosmia?  denies   Any incontinence of urine?  She has a history of urgency and stress incontinence, uses pads Any bowel dysfunction?  Diarrhea is more  frequent than before   Patient lives  husband History of heavy alcohol intake? denies   History of heavy tobacco use? denies   Family history of dementia?  Mother had PD "but did not have dementia ".  She denies any history of dementia in family Does patient drive? Yes, denies getting lost   Allergies  Allergen Reactions   Pollen Extract Itching    sneezing    Current Outpatient Medications  Medication Instructions   acetaminophen (TYLENOL) 500 mg, Oral, 2 times daily   alendronate (FOSAMAX) 70 MG tablet TAKE 1 TABLET BY MOUTH ONCE WEEKLY BEFORE THE FIRST FOOD, BEVERAGE OR MEDICINE OF THE DAY WITH PLAIN WATER    Calcium Carbonate-Vit D-Min (CALCIUM 1200 PO) 1 tablet, Oral, 2 times daily   cholecalciferol (VITAMIN D3) 125 mcg, Oral, Daily   diclofenac (VOLTAREN) 75 mg, Oral, 2 times daily   estradiol (ESTRACE) 1 mg, Oral, Daily   ferrous sulfate 325 mg, Oral, Daily with breakfast   Fish Oil-Cholecalciferol (FISH OIL + D3) 1200-1000 MG-UNIT CAPS 1 tablet, Oral, Daily   Glucosamine-Chondroit-Vit C-Mn (GLUCOSAMINE CHONDR 1500 COMPLX) CAPS 1 capsule, Oral, 2 times daily   Lifitegrast (XIIDRA) 5 % SOLN Ophthalmic   Multiple Vitamins-Minerals (WOMENS MULTI VITAMIN & MINERAL PO) 1 tablet, Oral, Daily   progesterone (PROMETRIUM) 100 mg, Oral, Daily   Red Yeast Rice 600 mg, Oral, Daily   Turmeric 500 mg, Oral, Daily   venlafaxine XR (EFFEXOR XR) 75 mg, Oral, Daily with breakfast, For 2 weeks.   venlafaxine XR (EFFEXOR XR) 37.5 mg, Oral, Daily with breakfast, For 2 weeks, then stop.   vitamin B-12 (CYANOCOBALAMIN) 100 mcg, Oral, Daily   vitamin E 100 Units, Oral, Daily     VITALS:   Vitals:   08/12/22 0746  BP: (!) 118/49  Resp: 20  Weight: 131 lb (59.4 kg)  Height: 5\' 3"  (1.6 m)     PHYSICAL EXAM   HEENT:  Normocephalic, atraumatic. The mucous membranes are moist. The superficial temporal arteries are without ropiness or tenderness. Cardiovascular: Regular rate and rhythm. Lungs: Clear to auscultation bilaterally. Neck: There are no carotid bruits noted bilaterally.  NEUROLOGICAL:    08/12/2022    8:00 AM  Montreal Cognitive Assessment   Visuospatial/ Executive (0/5) 3  Naming (0/3) 3  Attention: Read list of digits (0/2) 2  Attention: Read list of letters (0/1) 1  Attention: Serial 7 subtraction starting at 100 (0/3) 3  Language: Repeat phrase (0/2) 2  Language : Fluency (0/1) 0  Abstraction (0/2) 0  Delayed Recall (0/5) 4  Orientation (0/6) 6  Total 24  Adjusted Score (based on education) 24        No data to display           Orientation:  Alert and oriented to person,  place and time. No aphasia or dysarthria. Fund of knowledge is appropriate. Recent memory impaired and remote memory intact.  Attention and concentration are normal.  Able to name objects and repeat phrases. Delayed recall 4/5 Cranial nerves: There is good facial symmetry. Extraocular muscles are intact and visual fields are full to confrontational testing. Speech is fluent and clear. no tongue deviation. Hearing is intact to conversational tone. Tone: Tone is good throughout. Sensation: Sensation is intact to light touch and pinprick throughout. Vibration is intact at the bilateral big toe.There is no extinction with double simultaneous stimulation.   Coordination: The patient has no difficulty with RAM's or FNF bilaterally. Normal finger to nose  Motor: Strength is 5/5 in the bilateral upper and lower extremities. There is no pronator drift. There are no fasciculations noted. DTR's: Deep tendon reflexes are 2/4 at the bilateral biceps, triceps, brachioradialis, patella and achilles.  Plantar responses are downgoing bilaterally. Gait and Station: The patient is able to ambulate without difficulty.The patient is able to ambulate in a tandem fashion, able to stand in the Romberg position.     Thank you for allowing [place] the opportunity to participate in the care of this nice patient. Please do not hesitate to contact us for any questions or concerns.   Total time spent on today's visit was 52 minutes dedicated to this patient today, preparing to see patient, examining the patient, ordering tests and/or medications and counseling the patient, documenting clinical information in the EHR or other health record, independently interpreting results and communicating results to the patient/family, discussing treatment and goals, answering patient's questions and coordinating care.  Cc:  [name], MD  Marlowe Kays 08/12/2022 9:04 AM

## 2022-08-16 ENCOUNTER — Ambulatory Visit: Payer: PPO | Admitting: Psychology

## 2022-08-16 ENCOUNTER — Encounter: Payer: Self-pay | Admitting: Psychology

## 2022-08-16 ENCOUNTER — Ambulatory Visit: Payer: PPO

## 2022-08-16 DIAGNOSIS — M1990 Unspecified osteoarthritis, unspecified site: Secondary | ICD-10-CM | POA: Insufficient documentation

## 2022-08-16 DIAGNOSIS — J302 Other seasonal allergic rhinitis: Secondary | ICD-10-CM | POA: Insufficient documentation

## 2022-08-16 DIAGNOSIS — R4189 Other symptoms and signs involving cognitive functions and awareness: Secondary | ICD-10-CM

## 2022-08-16 DIAGNOSIS — G3184 Mild cognitive impairment, so stated: Secondary | ICD-10-CM | POA: Diagnosis not present

## 2022-08-16 HISTORY — DX: Mild cognitive impairment of uncertain or unknown etiology: G31.84

## 2022-08-16 NOTE — Progress Notes (Signed)
   Psychometrician Note   Cognitive testing was administered to Taylor Taylor by Shan Levans, B.S. (psychometrist) under the supervision of Dr. Newman Nickels, Ph.D., licensed psychologist on 08/16/2022. [name] did not appear overtly distressed by the testing session per behavioral observation or responses across self-report questionnaires. Rest breaks were offered.    The battery of tests administered was selected by Dr. Newman Nickels, Ph.D. with consideration to [name]'s current level of functioning, the nature of her symptoms, emotional and behavioral responses during interview, level of literacy, observed level of motivation/effort, and the nature of the referral question. This battery was communicated to the psychometrist. Communication between Dr. Newman Nickels, Ph.D. and the psychometrist was ongoing throughout the evaluation and Dr. Newman Nickels, Ph.D. was immediately accessible at all times. Dr. Newman Nickels, Ph.D. provided supervision to the psychometrist on the date of this service to the extent necessary to assure the quality of all services provided.    [name] will return within approximately 1-2 weeks for an interactive feedback session with Dr. Milbert Coulter at which time her test performances, clinical impressions, and treatment recommendations will be reviewed in detail. [name] understands she can contact our office should she require our assistance before this time.  A total of 125 minutes of billable time were spent face-to-face with [name] by the psychometrist. This includes both test administration and scoring time. Billing for these services is reflected in the clinical report generated by Dr. Newman Nickels, Ph.D.  This note reflects time spent with the psychometrician and does not include test scores or any clinical interpretations made by Dr. Milbert Coulter. The full report will follow in a separate note.

## 2022-08-16 NOTE — Progress Notes (Signed)
NEUROPSYCHOLOGICAL EVALUATION [place]. [facility] Department of Neurology  Date of Evaluation: Aug 16, 2022  Reason for Referral:   Taylor Taylor is a 77 y.o. right-handed Caucasian female referred by Marlowe Kays, PA-C, to characterize her current cognitive functioning and assist with diagnostic clarity and treatment planning in the context of subjective cognitive decline.   Assessment and Plan:   Clinical Impression(s): Taylor Taylor pattern of performance is suggestive of an isolated impairment surrounding verbal fluency (phonemic slightly worse than semantic). Additional performance variability was exhibited across executive functioning. Performances were appropriate relative to age-matched peers across other assessed cognitive domains. This includes processing speed, attention/concentration, safety/judgment, receptive language, confrontation naming, visuospatial abilities, and all aspects of learning and memory. Taylor Taylor denied difficulties completing instrumental activities of daily living (ADLs) independently. As such, given evidence for cognitive dysfunction described above, she meets criteria for a Mild Neurocognitive Disorder ("mild cognitive impairment"). However, the mild nature of this diagnostic classification should be emphasized presently.   The etiology for her fairly isolated verbal fluency impairment is unclear at the present time. Responses across mood-related questionnaires suggested acute levels of mild to moderate anxiety. She also reported nightly consumption of Tylenol PM to help sleep, which has known anticholingeric properties. Taken together, psychiatric distress and medication side effects could theoretically represent the primary cause for ongoing mild dysfunction. Deficits in language and executive functioning admittedly can raise primary progressive aphasia (PPA) concerns. There is no recent neuroimaging to better understand any anatomical  correlates or atrophic patterns which could offer potential explanations. Her most recent brain MRI in 2020 was unremarkable. Overall, I cannot comment on the likelihood of an underlying neurological process creating cognitive dysfunction at the present time. Continued medical monitoring will be important moving forward.  Memory patterns are not worrisome for typically presenting Alzheimer's disease. She also does not display cognitive or behavioral patterns concerning for Lewy body disease, another more rare parkinsonian condition, or the behavioral variant of frontotemporal lobar degeneration. A potential vascular contribution cannot be theorized given the lack of recent neuroimaging. However, no prominent concerns were noted in 2020.   Recommendations: A brain MRI had been ordered but not yet scheduled by Taylor Taylor. I would strongly recommend that she schedule and complete this scan. It would be very helpful for the reading neuroradiologist to examine any perisylvian atrophy patterns or patterns involving the temporal and frontal lobes more broadly.   A repeat neuropsychological evaluation in 24 months (or sooner if functional decline is noted) is recommended to assess the trajectory of future cognitive decline should it occur. This will also aid in future efforts towards improved diagnostic clarity.  Taylor Taylor reported nightly use of Tylenol PM to help her fall asleep. This medication is an anticholinergic drug, meaning that it's consistent use can disrupt and worsen day-to-day thinking abilities. I would strongly encourage the discontinuation of this medication. If she requires assistance falling asleep at night, I would suggest that she discuss alternative medication approaches with her PCP.   A combination of medication and psychotherapy has been shown to be most effective at treating symptoms of anxiety and depression. As such, Taylor Taylor is encouraged to speak with her prescribing physician  regarding medication adjustments to optimally manage these symptoms. Likewise, Taylor Taylor is encouraged to consider engaging in short-term psychotherapy to address symptoms of psychiatric distress. She would benefit from an active and collaborative therapeutic environment, rather than one purely supportive in nature. Recommended treatment modalities include Cognitive Behavioral  Therapy (CBT) or Acceptance and Commitment Therapy (ACT).  Performance across neurocognitive testing is not a strong predictor of an individual's safety operating a motor vehicle. Should her family wish to pursue a formalized driving evaluation, they could reach out to the following agencies: [facility] in [place]: [phone] [facility]: [phone] [facility]: [phone] [facility]: [phone] or [phone]  Should there be progression of current deficits over time, Taylor Taylor is unlikely to regain any independent living skills lost. Therefore, it is recommended that she remain as involved as possible in all aspects of household chores, finances, and medication management, with supervision to ensure adequate performance. she will likely benefit from the establishment and maintenance of a routine in order to maximize her functional abilities over time.  If not already done, Taylor Taylor and her family may want to discuss her wishes regarding durable power of attorney and medical decision making, so that she can have input into these choices. If they require legal assistance with this, long-term care resource access, or other aspects of estate planning, they could reach out to [employer] at [phone] for a free consultation.   Taylor Taylor is encouraged to attend to lifestyle factors for brain health (e.g., regular physical exercise, good nutrition habits and consideration of the MIND-DASH diet, regular participation in cognitively-stimulating activities, and general  stress management techniques), which are likely to have benefits for both emotional adjustment and cognition. In fact, in addition to promoting good general health, regular exercise incorporating aerobic activities (e.g., brisk walking, jogging, cycling, etc.) has been demonstrated to be a very effective treatment for depression and stress, with similar efficacy rates to both antidepressant medication and psychotherapy. Optimal control of vascular risk factors (including safe cardiovascular exercise and adherence to dietary recommendations) is encouraged. Continued participation in activities which provide mental stimulation and social interaction is also recommended.   Memory can be improved using internal strategies such as rehearsal, repetition, chunking, mnemonics, association, and imagery. External strategies such as written notes in a consistently used memory journal, visual and nonverbal auditory cues such as a calendar on the refrigerator or appointments with alarm, such as on a cell phone, can also help maximize recall.    Reducing anxiety may also aid in the retrieval of information. Taylor Taylor is encouraged to prepare scripts she can use socially when she experiences difficulty with word finding or memory. Such scripts should be brief explanations of the difficulty (e.g., "the word escapes me now") and allow her to move the conversation forward quickly rather than dwelling on the issue.  Review of Records:   Taylor Taylor was seen by [facility] Marlowe Kays, PA-C) on 08/12/2022 for an evaluation of memory loss. At that time, Taylor Taylor described primary trouble with the recollection of names, said to be ongoing for the past 6-8 months. She had been taking Effexor at the time but discontinued this about 6-8 weeks prior due to "not feeling quite well" and being concerned surrounding potential cognitive side effects. She is otherwise active in her life and trouble performing ADLs was denied.  Performance on a brief cognitive screening instrument (MOCA) was 24/30. Ultimately, Taylor Taylor was referred for a comprehensive neuropsychological evaluation to characterize her cognitive abilities and to assist with diagnostic clarity and treatment planning.   Brain MRI on 10/31/2018 was unremarkable. An updated brain MRI had been ordered but not yet scheduled at the time of her current evaluation.   Past Medical History:  Diagnosis Date  Arthritis    back   Fatigue 11/10/2021   GERD (gastroesophageal reflux disease)    in past   Hypertriglyceridemia without hypercholesterolemia 09/03/2013   Neck pain on right side 12/25/2018   Osteopenia 11/16/2015   h/o bisphos use in records. PCP manages, dexa normal 2020, per pt as of 2022, osteopenia again, advised to f/u with PCP to discussed tx plan   Pain in left hand 05/18/2018   Pain in right elbow 10/15/2020   Pain in right hand 05/18/2018   Pain in right hip 04/30/2019   Pain in right shoulder 12/11/2018   Precordial chest pain 08/31/2009   Seasonal allergies    Unilateral primary osteoarthritis, right knee 08/24/2016   Viral gastroenteritis 07/13/2022    Past Surgical History:  Procedure Laterality Date   BACK SURGERY     Scheduled for 08/19/2021., Dr, Caralee Ates.   CERVICAL DISC SURGERY     over 10 years   COLONOSCOPY      Current Outpatient Medications:    acetaminophen (TYLENOL) 500 MG tablet, Take 500 mg by mouth 2 (two) times daily., Disp: , Rfl:    alendronate (FOSAMAX) 70 MG tablet, TAKE 1 TABLET BY MOUTH ONCE WEEKLY BEFORE THE FIRST FOOD, BEVERAGE OR MEDICINE OF THE DAY WITH PLAIN WATER, Disp: 12 tablet, Rfl: 3   Calcium Carbonate-Vit D-Min (CALCIUM 1200 PO), Take 1 tablet by mouth in the morning and at bedtime., Disp: , Rfl:    cholecalciferol (VITAMIN D3) 25 MCG (1000 UNIT) tablet, Take 125 mcg by mouth daily., Disp: , Rfl:    diclofenac (VOLTAREN) 75 MG EC tablet, Take 1 tablet (75 mg total) by mouth 2 (two) times daily.,  Disp: 90 tablet, Rfl: 2   estradiol (ESTRACE) 1 MG tablet, Take 1 tablet (1 mg total) by mouth daily., Disp: 90 tablet, Rfl: 3   ferrous sulfate 325 (65 FE) MG tablet, Take 325 mg by mouth daily with breakfast., Disp: , Rfl:    Fish Oil-Cholecalciferol (FISH OIL + D3) 1200-1000 MG-UNIT CAPS, Take 1 tablet by mouth daily., Disp: , Rfl:    Glucosamine-Chondroit-Vit C-Mn (GLUCOSAMINE CHONDR 1500 COMPLX) CAPS, Take 1 capsule by mouth 2 (two) times daily., Disp: , Rfl:    Lifitegrast (XIIDRA) 5 % SOLN, Apply to eye., Disp: , Rfl:    Multiple Vitamins-Minerals (WOMENS MULTI VITAMIN & MINERAL PO), Take 1 tablet by mouth daily., Disp: , Rfl:    progesterone (PROMETRIUM) 100 MG capsule, Take 1 capsule (100 mg total) by mouth daily., Disp: 90 capsule, Rfl: 3   Red Yeast Rice 600 MG CAPS, Take 600 mg by mouth daily. , Disp: , Rfl:    Turmeric 500 MG TABS, Take 500 mg by mouth daily. , Disp: , Rfl:    venlafaxine XR (EFFEXOR XR) 37.5 MG 24 hr capsule, Take 2 capsules (75 mg total) by mouth daily with breakfast. For 2 weeks., Disp: 28 capsule, Rfl: 0   venlafaxine XR (EFFEXOR XR) 37.5 MG 24 hr capsule, Take 1 capsule (37.5 mg total) by mouth daily with breakfast. For 2 weeks, then stop., Disp: 14 capsule, Rfl: 0   vitamin B-12 (CYANOCOBALAMIN) 100 MCG tablet, Take 100 mcg by mouth daily., Disp: , Rfl:    vitamin E 100 UNIT capsule, Take 100 Units by mouth daily., Disp: , Rfl:   Current Facility-Administered Medications:    0.9 %  sodium chloride infusion, 500 mL, Intravenous, Continuous, Pyrtle, Carie Caddy, MD  Clinical Interview:   The following information was obtained during a clinical  interview with Taylor Taylor prior to cognitive testing.  Cognitive Symptoms: Decreased short-term memory: Endorsed. When originally asked, Taylor Taylor reported "not really" having any memory concerns and did not provide any examples. When it was highlighted that she had informed Taylor Taylor about trouble with name recollection  during the prior several months just four days ago, she then acknowledged some memory concerns in this regard. No other examples were provided. She was unsure if there had been any formal progression of concerns over the past 6-8 months.  Decreased long-term memory: Denied. Decreased attention/concentration: Endorsed. She alluded to some trouble with distractibility, noting that she will start projects and often stop and start other projects, leaving original projects unfinished or in various stages of completion. She noted that this was a longstanding pattern but perhaps seems to have gotten worse as she has aged.  Reduced processing speed: Endorsed. She reported some mild mental fogginess at times.  Difficulties with executive functions: Endorsed. She reported some mild difficulties with organization and multi-tasking. She denied trouble with impulsivity or any significant personality changes.  Difficulties with emotion regulation: Denied. Difficulties with receptive language: Denied. Difficulties with word finding: Endorsed. She reported experiencing some tip-of-the-tongue phenomenons.  Decreased visuoperceptual ability: Denied.  Difficulties completing ADLs: Denied.  Additional Medical History: History of traumatic brain injury/concussion: Denied. History of stroke: Denied. History of seizure activity: Denied. History of known exposure to toxins: Denied. Symptoms of chronic pain: Denied outside of arthritic pain in her back. Symptoms were not said to be debilitating and were attributed to age.  Experience of frequent headaches/migraines: Denied. Frequent instances of dizziness/vertigo: Endorsed. Symptoms were said to occur when standing quickly or abruptly changing her position. These Taylor seem to impact her balance at times, where she noted feeling as though she cannot always walk a straight line.   Sensory changes: She wears glasses with benefit. Other sensory changes/difficulties (e.g.,  hearing, taste, smell) were denied.  Balance/coordination difficulties: Denied outside of the impact of occasional dizziness. She denied any recent falls or one side of the body seeming less stable than the other.  Other motor difficulties: Denied.  Sleep History: Estimated hours obtained each night: 7-8 hours.  Difficulties falling asleep: Endorsed. However, she reported taking a Tylenol PM nightly which is effective at helping her fall asleep. Difficulties staying asleep: Denied. Feels rested and refreshed upon awakening: Denied. She reported consistently waking feeling fatigued and like she could lay in bed for a longer duration of time.   History of snoring: Denied. History of waking up gasping for air: Denied. Witnessed breath cessation while asleep: Denied.  History of vivid dreaming: Denied. Excessive movement while asleep: Denied. Instances of acting out her dreams: Denied.  Psychiatric/Behavioral Health History: Depression: She acknowledged that she does "feel more depressed lately." She was unsure of any particular cause for this and did not provide any recent stressors or events which could trigger these symptoms. She denied ever being formally diagnosed with a depressive condition in the past. Current or remote suicidal ideation, intent, or plan was denied.  Anxiety: Denied. Mania: Denied. Trauma History: Denied. Visual/auditory hallucinations: Denied. Delusional thoughts: Denied.  Tobacco: Denied. Alcohol: She denied current alcohol consumption as well as a history of problematic alcohol abuse or dependence.  Recreational drugs: Denied.  Family History: Problem Relation Age of Onset   Parkinson's disease Mother    Brain cancer Father    Breast cancer Neg Hx    This information was confirmed by Ms. Davia.  Academic/Vocational History:  Highest level of educational attainment: 13 years. 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 She described herself as a good (A/B) student in academic settings. She reported a longstanding weakness in retaining new information, dating back to high school and earlier. When described, Ms. Mcclendon may have actually had trouble with reading comprehension which resulted in inefficient learning. She was never formally diagnosed with any type of learning disability.   History of developmental delay: Denied. History of grade repetition: Denied. Enrollment in special education courses: Denied. History of LD/ADHD: Denied.  Employment: Retired. She previously worked as an Manufacturing engineer.   Evaluation Results:   Behavioral Observations: Taylor Taylor was unaccompanied, arrived to her appointment on time, and was appropriately dressed and groomed. She appeared alert and oriented. Observed gait and station were within normal limits. Gross motor functioning appeared intact upon informal observation and no abnormal movements (e.g., tremors) were noted. Her affect was generally relaxed and positive. Spontaneous speech was fluent and word finding difficulties were not observed during the clinical interview. Thought processes were coherent, organized, and normal in content. Insight into her cognitive difficulties appeared adequate.   During testing, sustained attention was appropriate. Task engagement was adequate and she persisted when challenged. She did briefly become tearful following poor perceived performance across verbal fluency tasks. However, she was able to recover and collect herself well. Overall, Ms. Dozois was cooperative with the clinical interview and subsequent testing procedures.   Adequacy of Effort: The validity of neuropsychological testing is limited by the extent to which the individual being tested may be assumed to have exerted adequate effort during testing. Ms. Goth expressed her intention to perform to the best of her abilities and exhibited adequate task engagement and  persistence. Scores across stand-alone and embedded performance validity measures were within expectation. As such, the results of the current evaluation are believed to be a valid representation of Ms. Niswander's current cognitive functioning.  Test Results: Ms. Barredo was oriented at the time of the current evaluation.  Intellectual abilities based upon educational and vocational attainment were estimated to be in the average range. Premorbid abilities were estimated to be within the average range based upon a single-word reading test.   Processing speed was average. Basic attention was below average. More complex attention (e.g., working memory) was average. Executive functioning was mildly variable, ranging from the well below average to average normative ranges. She performed in the above average range across a task assessing safety and judgment.   Assessed receptive language abilities were above average. Likewise, Ms. Schiffer did not exhibit any difficulties comprehending task instructions and answered all questions asked of her appropriately. Assessed expressive language was variable. Phonemic fluency was exceptionally low to well below average, semantic fluency was well below average to below average, and confrontation naming was average to above average.     Assessed visuospatial/visuoconstructional abilities were average to well above average.    Learning (i.e., encoding) of novel verbal information was average. Spontaneous delayed recall (i.e., retrieval) of previously learned information was average. Retention rates were strong across all memory tasks. Performance across recognition tasks was appropriate, suggesting evidence for information consolidation.   Results of emotional screening instruments suggested that recent symptoms of generalized anxiety were in the mild to moderate range, while symptoms of depression were within normal limits. A screening instrument assessing recent sleep quality  suggested the presence of minimal sleep dysfunction.  Tables of Scores:   Note: This summary of  test scores accompanies the interpretive report and should not be considered in isolation without reference to the appropriate sections in the text. Descriptors are based on appropriate normative data and may be adjusted based on clinical judgment. Terms such as "Within Normal Limits" and "Outside Normal Limits" are used when a more specific description of the test score cannot be determined.       Percentile - Normative Descriptor > 98 - Exceptionally High 91-97 - Well Above Average 75-90 - Above Average 25-74 - Average 9-24 - Below Average 2-8 - Well Below Average < 2 - Exceptionally Low       Orientation:      Raw Score Percentile   NAB Orientation, Form 1 28/29 --- ---       Cognitive Screening:      Raw Score Percentile   SLUMS: 26/30 --- ---       RBANS, Form A: Standard Score/ Scaled Score Percentile   Total Score 95 37 Average  Immediate Memory 106 66 Average    List Learning 11 63 Average    Story Memory 11 63 Average  Visuospatial/Constructional 105 63 Average    Figure Copy 14 91 Well Above Average    Line Orientation 15/20 26-50 Average  Language 88 21 Below Average    Picture Naming 10/10 51-75 Average    Semantic Fluency 5 5 Well Below Average  Attention 85 16 Below Average    Digit Span 7 16 Below Average    Coding 8 25 Average  Delayed Memory 101 53 Average    List Recall 5/10 51-75 Average    List Recognition 20/20 51-75 Average    Story Recall 9 37 Average    Story Recognition 12/12 69+ Average    Figure Recall 8 25 Average    Figure Recognition 5/8 21-29 Below Average to  Average        Intellectual Functioning:      Standard Score Percentile   Test of Premorbid Functioning: 92 30 Average       Attention/Executive Function:     Trail Making Test (TMT): Raw Score (T Score) Percentile     Part A 42 secs., 0 errors (45) 31 Average    Part B 133  secs., 0 errors (40) 16 Below Average         Scaled Score Percentile   WAIS-IV Digit Span: 9 37 Average    Forward 7 16 Below Average    Backward 9 37 Average    Sequencing 10 50 Average        Scaled Score Percentile   WAIS-IV Similarities: 8 25 Average       D-KEFS Color-Word Interference Test: Raw Score (Scaled Score) Percentile     Color Naming 35 secs. (9) 37 Average    Word Reading 23 secs. (11) 63 Average    Inhibition 109 secs. (4) 2 Well Below Average      Total Errors 2 errors (11) 63 Average    Inhibition/Switching 86 secs. (9) 37 Average      Total Errors 1 error (12) 75 Above Average       D-KEFS Verbal Fluency Test: Raw Score (Scaled Score) Percentile     Letter Total Correct 14 (4) 2 Well Below Average    Category Total Correct 24 (6) 9 Below Average    Category Switching Total Correct 9 (6) 9 Below Average    Category Switching Accuracy 8 (7) 16 Below Average      Total Set  Loss Errors 0 (13) 84 Above Average      Total Repetition Errors 0 (13) 84 Above Average       NAB Executive Functions Module, Form 1: T Score Percentile     Judgment 57 75 Above Average       Language:     Verbal Fluency Test: Raw Score (T Score) Percentile     Phonemic Fluency (FAS) 14 (24) <1 Exceptionally Low    Animal Fluency 12 (36) 8 Well Below Average        NAB Language Module, Form 1: T Score Percentile     Auditory Comprehension 58 79 Above Average    Naming 31/31 (60) 84 Above Average       Visuospatial/Visuoconstruction:      Raw Score Percentile   Clock Drawing: 10/10 --- Within Normal Limits        Scaled Score Percentile   WAIS-IV Block Design: 10 50 Average       Mood and Personality:      Raw Score Percentile   Beck Depression Inventory - II: 6 --- Within Normal Limits  Geriatric Anxiety Scale: 21 --- Mild    Somatic 10 --- Moderate    Cognitive 6 --- Mild    Affective 5 --- Mild       Additional Questionnaires:      Raw Score Percentile   PROMIS Sleep  Disturbance Questionnaire: 12 --- None to Slight   Informed Consent and Coding/Compliance:   The current evaluation represents a clinical evaluation for the purposes previously outlined by the referral source and is in no way reflective of a forensic evaluation.   Ms. Paulman was provided with a verbal description of the nature and purpose of the present neuropsychological evaluation. Also reviewed were the foreseeable risks and/or discomforts and benefits of the procedure, limits of confidentiality, and mandatory reporting requirements of this provider. The patient was given the opportunity to ask questions and receive answers about the evaluation. Oral consent to participate was provided by the patient.   This evaluation was conducted by Newman Nickels, Ph.D., ABPP-CN, board certified clinical neuropsychologist. Ms. Loupe completed a clinical interview with Dr. Milbert Coulter, billed as one unit [phone], and 125 minutes of cognitive testing and scoring, billed as one unit [phone] and three additional units [id number]. Psychometrist Shan Levans, B.S., assisted Dr. Milbert Coulter with test administration and scoring procedures. As a separate and discrete service, one unit [id number] and two units [phone] were billed for Dr. Tammy Sours time spent in interpretation and report writing.

## 2022-08-17 ENCOUNTER — Telehealth: Payer: Self-pay | Admitting: Physician Assistant

## 2022-08-17 NOTE — Telephone Encounter (Signed)
Patient wants to check on the status of the order for a brain scan she has not heard anything

## 2022-08-17 NOTE — Telephone Encounter (Signed)
Checking with [facility] now

## 2022-08-18 ENCOUNTER — Ambulatory Visit (INDEPENDENT_AMBULATORY_CARE_PROVIDER_SITE_OTHER): Payer: PPO | Admitting: Sports Medicine

## 2022-08-18 ENCOUNTER — Ambulatory Visit: Payer: PPO | Admitting: Physician Assistant

## 2022-08-18 ENCOUNTER — Ambulatory Visit (INDEPENDENT_AMBULATORY_CARE_PROVIDER_SITE_OTHER): Payer: PPO

## 2022-08-18 ENCOUNTER — Encounter: Payer: Self-pay | Admitting: Physician Assistant

## 2022-08-18 DIAGNOSIS — G8929 Other chronic pain: Secondary | ICD-10-CM | POA: Diagnosis not present

## 2022-08-18 DIAGNOSIS — M25511 Pain in right shoulder: Secondary | ICD-10-CM

## 2022-08-18 DIAGNOSIS — M899 Disorder of bone, unspecified: Secondary | ICD-10-CM

## 2022-08-18 MED ORDER — LIDOCAINE HCL 1 % IJ SOLN
1.0000 mL | INTRAMUSCULAR | Status: AC | PRN
Start: 2022-08-18 — End: 2022-08-18
  Administered 2022-08-18: 1 mL

## 2022-08-18 MED ORDER — BUPIVACAINE HCL 0.25 % IJ SOLN
1.0000 mL | INTRAMUSCULAR | Status: AC | PRN
Start: 2022-08-18 — End: 2022-08-18
  Administered 2022-08-18: 1 mL

## 2022-08-18 MED ORDER — METHYLPREDNISOLONE ACETATE 40 MG/ML IJ SUSP
40.0000 mg | INTRAMUSCULAR | Status: AC | PRN
Start: 2022-08-18 — End: 2022-08-18
  Administered 2022-08-18: 40 mg via INTRAMUSCULAR

## 2022-08-18 NOTE — Progress Notes (Signed)
   Procedure Note  [name]             Date of Birth: [birth year]           MRN: [id number]             Visit Date: 08/18/2022  Procedures: Visit Diagnoses:  1. Trigger point of right shoulder region   2. Scapular dysfunction    Trigger Point Inj  Date/Time: 08/18/2022 10:25 AM  Performed by: Madelyn Brunner, DO Authorized by: Madelyn Brunner, DO   Consent Given by:  Patient Site marked: the procedure site was marked   Timeout: prior to procedure the correct patient, procedure, and site was verified   Indications:  Pain and therapeutic Total # of Trigger Points:  3 or more Location: back   Needle Size:  25 G Approach:  Dorsal Medications #1:  1 mL lidocaine 1 %; 1 mL bupivacaine 0.25 %; 40 mg methylPREDNISolone acetate 40 MG/ML Medications #2:  1 mL lidocaine 1 %; 1 mL bupivacaine 0.25 % Medications #3:  1 mL lidocaine 1 %; 1 mL bupivacaine 0.25 % Additional Injections?: No   Patient tolerance:  Patient tolerated the procedure well with no immediate complications Comments: Procedure: Trigger point injections (3), right scapular border and lower trapezius After discussion on R/B/I and informed verbal consent was obtained, a timeout was conducted. The patient was placed in a prone position on the examination table and the area of maximal tenderness was identified over the right scapular border musculature and right lower trapezius.  This area was cleansed with Betadine and multiple alcohol swabs. Ethyl chloride was used for local anesthesia. Using a 25-gauge 1.5 inch needle the trigger point(s) was subsequently injected with a mixture of 1 cc of methylprednisolone 40 mg/mL and 3 cc of 1% lidocaine without epinephrine, 3 cc of bupivicaine 0.25% with a total of 2cc of injectate into each trigger point. A band-aid was applied following. Patient tolerated procedure well, there were no post-injection complications. Post-procedure instructions were given.    - tolerated  procedure well - will follow-up with Mary-Anne Persons or myself as needed - did provide her a handout for scapular retraction exercises as well, perform once daily as able  Madelyn Brunner, DO Primary Care Sports Medicine Physician  [facility] - Orthoped[facility]  This note was dictated using Dragon naturally speaking software and may contain errors in syntax, spelling, or content which have not been identified prior to signing this note.

## 2022-08-18 NOTE — Progress Notes (Signed)
Here for trigger point injection

## 2022-08-18 NOTE — Progress Notes (Signed)
Office Visit Note   [name]           Date of Birth: [birth year]           MRN: [id number] Visit Date: 08/18/2022              Requested by: [name], MD [address] [place],  [place] [address] PCP: [name], MD  Chief Complaint  Patient presents with   Right Shoulder - Pain      HPI: Taylor Taylor comes in today with a chief complaint of right scapular border pain.  She is a former patient of Dr. Hoy Register.  He has given her trigger point injections in the past and she has responded very well she is wonder if she can have 1 today.  Denies any paresthesias any recent injuries or falls.  Assessment & Plan: Visit Diagnoses:  1. Chronic right shoulder pain     Plan: Will refer her to see Dr. Shon Baton today for a trigger point injection may follow-up with me as needed  Follow-Up Instructions: Return if symptoms worsen or fail to improve.   Ortho Exam  Patient is alert, oriented, no adenopathy, well-dressed, normal affect, normal respiratory effort. Examination she has mild stiffness in her neck from previous surgery otherwise she is able to extend and flex turn her head hide side without any radicular findings.  Her shoulder strength is intact she has full forward elevation and flexion behind her back.  No paresthesias.  She does have point tenderness over the scapular border no surrounding erythema no shortness of breath  Imaging: XR Cervical Spine 2 or 3 views  Result Date: 08/18/2022 2 views of her cervical spine were obtained today.  No acute fractures.  She has previous fusion at C5-6.  Hardware is intact and in place.  She does have significant degenerative changes at C4-5 level.  With joint space narrowing and sclerotic changes  XR Shoulder Right  Result Date: 08/18/2022 Radiographs of her shoulder were obtained today.  Humeral head is well placed in the glenoid fossa.  No evidence of any fracture no evidence of any significant  degenerative changes some mild sclerotic changes over the [facility] joint  No images are attached to the encounter.  Labs: Lab Results  Component Value Date   HGBA1C 5.4 07/20/2020   ESRSEDRATE 2 07/27/2021   ESRSEDRATE 2 03/02/2020   ESRSEDRATE 2 05/25/2017     Lab Results  Component Value Date   ALBUMIN 4.2 11/12/2015   ALBUMIN 3.9 11/18/2014   ALBUMIN 4.2 07/17/2014    No results found for: "MG" Lab Results  Component Value Date   VD25OH 63 11/12/2015    No results found for: "PREALBUMIN"    Latest Ref Rng & Units 07/19/2022   11:48 AM 06/02/2022    8:06 AM 07/27/2021    9:44 AM  CBC EXTENDED  WBC 3.8 - 10.8 Thousand/uL 8.7  5.4  5.6   RBC 3.80 - 5.10 Million/uL 4.11  4.31  4.51   Hemoglobin 11.7 - 15.5 g/dL 19.1  47.8  29.5   HCT 35.0 - 45.0 % 38.0  40.9  42.6   Platelets 140 - 400 Thousand/uL 292  304  317   NEUT# 1,500 - 7,800 cells/uL 5,377  2,457  3,041   Lymph# 850 - 3,900 cells/uL 2,506  1,987  1,826      There is no height or weight on file to calculate BMI.  Orders:  Orders Placed This Encounter  Procedures   XR Shoulder Right   XR Cervical Spine 2 or 3 views   No orders of the defined types were placed in this encounter.    Procedures: No procedures performed  Clinical Data: No additional findings.  ROS:  All other systems negative, except as noted in the HPI. Review of Systems  Objective: Vital Signs: There were no vitals taken for this visit.  Specialty Comments:  MRI LUMBAR SPINE WITHOUT CONTRAST   TECHNIQUE: Multiplanar, multisequence MR imaging of the lumbar spine was performed. No intravenous contrast was administered.   COMPARISON:  11/07/2017   FINDINGS: Segmentation:  Standard.   Alignment:  Physiologic.   Vertebrae:  No fracture, evidence of discitis, or bone lesion.   Conus medullaris and cauda equina: Conus extends to the L1 level. Conus and cauda equina appear normal.   Paraspinal and other soft tissues: Negative.    Disc levels:   L1-L2: Normal disc space and facet joints. No spinal canal stenosis. No neural foraminal stenosis.   L2-L3: Progression of small disc bulge with facet hypertrophy. No spinal canal stenosis. No neural foraminal stenosis.   L3-L4: Unchanged left asymmetric disc bulge and mild facet hypertrophy. Left lateral recess narrowing without central spinal canal stenosis. No neural foraminal stenosis.   L4-L5: Unchanged right asymmetric disc bulge and superimposed right subarticular protrusion. Right lateral recess narrowing without central spinal canal stenosis. No neural foraminal stenosis.   L5-S1: Disc space narrowing with small bulge and endplate spurring. No spinal canal stenosis. Unchanged moderate bilateral neural foraminal stenosis.   Visualized sacrum: Normal.   IMPRESSION: 1. Unchanged moderate bilateral L5-S1 neural foraminal stenosis. 2. Unchanged right L4-5 lateral recess stenosis. 3. Unchanged left L3-4 lateral recess stenosis.     Electronically Signed   By: Deatra Robinson M.D.   On: 05/19/2021 03:01  PMFS History: Patient Active Problem List   Diagnosis Date Noted   Arthritis 08/16/2022   Seasonal allergies 08/16/2022   Mild cognitive impairment of uncertain or unknown etiology 08/16/2022   Viral gastroenteritis 07/13/2022   Fatigue 11/10/2021   Unilateral primary osteoarthritis, right knee 08/24/2016   Osteopenia 11/16/2015   Hypertriglyceridemia without hypercholesterolemia 09/03/2013   Past Medical History:  Diagnosis Date   Arthritis    back   Fatigue 11/10/2021   GERD (gastroesophageal reflux disease)    in past   Hypertriglyceridemia without hypercholesterolemia 09/03/2013   Mild cognitive impairment of uncertain or unknown etiology 08/16/2022   Neck pain on right side 12/25/2018   Osteopenia 11/16/2015   h/o bisphos use in records. PCP manages, dexa normal 2020, per pt as of 2022, osteopenia again, advised to f/u with PCP to discussed  tx plan   Pain in left hand 05/18/2018   Pain in right elbow 10/15/2020   Pain in right hand 05/18/2018   Pain in right hip 04/30/2019   Pain in right shoulder 12/11/2018   Precordial chest pain 08/31/2009   Seasonal allergies    Unilateral primary osteoarthritis, right knee 08/24/2016   Viral gastroenteritis 07/13/2022    Family History  Problem Relation Age of Onset   Parkinson's disease Mother    Brain cancer Father    Breast cancer Neg Hx     Past Surgical History:  Procedure Laterality Date   BACK SURGERY     Scheduled for 08/19/2021., Dr, Caralee Ates.   CERVICAL DISC SURGERY     over 10 years   COLONOSCOPY     Social History  Occupational History   Occupation: Retired  Tobacco Use   Smoking status: Never   Smokeless tobacco: Never  Vaping Use   Vaping Use: Never used  Substance and Sexual Activity   Alcohol use: No   Drug use: No   Sexual activity: Not on file

## 2022-08-19 ENCOUNTER — Ambulatory Visit
Admission: RE | Admit: 2022-08-19 | Discharge: 2022-08-19 | Disposition: A | Discharge status: Home / Self Care | Payer: PPO | Source: Ambulatory Visit | Attending: Physician Assistant | Admitting: Physician Assistant

## 2022-08-19 DIAGNOSIS — R413 Other amnesia: Secondary | ICD-10-CM | POA: Diagnosis not present

## 2022-08-20 ENCOUNTER — Other Ambulatory Visit: Payer: PPO

## 2022-08-22 NOTE — Progress Notes (Signed)
Left message to call office at 08/22/2022 at 9:13am

## 2022-08-22 NOTE — Progress Notes (Signed)
Left message to call office at 9:18 08/22/2022

## 2022-08-22 NOTE — Progress Notes (Signed)
No acute findings on MRI. Unremarkable.

## 2022-08-22 NOTE — Telephone Encounter (Signed)
Pt called in returning our call about results 

## 2022-08-22 NOTE — Telephone Encounter (Signed)
Patient advised of MRI results, voiced understanding and thanked me for callin

## 2022-08-23 ENCOUNTER — Ambulatory Visit: Payer: PPO | Admitting: Psychology

## 2022-08-23 DIAGNOSIS — G3184 Mild cognitive impairment, so stated: Secondary | ICD-10-CM

## 2022-08-23 NOTE — Progress Notes (Signed)
   Neuropsychology Feedback Session Taylor Taylor. [facility] Department of Neurology  Reason for Referral:   Taylor Taylor is a 77 y.o. right-handed Caucasian female referred by Marlowe Kays, PA-C, to characterize her current cognitive functioning and assist with diagnostic clarity and treatment planning in the context of subjective cognitive decline.   Feedback:   Taylor Taylor completed a comprehensive neuropsychological evaluation on 08/16/2022. Please refer to that encounter for the full report and recommendations. Briefly, results suggested an isolated impairment surrounding verbal fluency (phonemic slightly worse than semantic). Additional performance variability was exhibited across executive functioning. Performances were appropriate relative to age-matched peers across other assessed cognitive domains. The etiology for her fairly isolated verbal fluency impairment is unclear at the present time. Responses across mood-related questionnaires suggested acute levels of mild to moderate anxiety. She also reported nightly consumption of Tylenol PM to help sleep, which has known anticholingeric properties. Taken together, psychiatric distress and medication side effects could theoretically represent the primary cause for ongoing mild dysfunction. Deficits in language and executive functioning admittedly can raise primary progressive aphasia (PPA) concerns. Her recent MRI did not suggest advanced perisylvian or frontotemporal atrophy to raise elevated concerns in this regard.   Taylor Taylor was unaccompanied during the current feedback session. Content of the current session focused on the results of her neuropsychological evaluation. Taylor Taylor was given the opportunity to ask questions and her questions were answered. She was encouraged to reach out should additional questions arise. A copy of her report was provided at the conclusion of the visit.      One unit [id number] was billed for  Dr. Tammy Sours time spent preparing for, conducting, and documenting the current feedback session with Ms. Deeney.

## 2022-08-24 ENCOUNTER — Telehealth: Payer: Self-pay | Admitting: Physician Assistant

## 2022-08-24 ENCOUNTER — Encounter: Payer: Self-pay | Admitting: Sports Medicine

## 2022-08-24 NOTE — Telephone Encounter (Signed)
Patient states her shoulder is still in the same pain as she was before the trigger point injection she would like to know what she can do about it it has been a week since the injection

## 2022-08-25 ENCOUNTER — Other Ambulatory Visit: Payer: Self-pay | Admitting: Physician Assistant

## 2022-08-25 ENCOUNTER — Ambulatory Visit: Payer: PPO

## 2022-08-25 ENCOUNTER — Ambulatory Visit: Payer: PPO | Admitting: Physician Assistant

## 2022-08-25 DIAGNOSIS — M5010 Cervical disc disorder with radiculopathy, unspecified cervical region: Secondary | ICD-10-CM

## 2022-08-29 ENCOUNTER — Other Ambulatory Visit: Payer: Self-pay | Admitting: Family Medicine

## 2022-08-29 ENCOUNTER — Other Ambulatory Visit (INDEPENDENT_AMBULATORY_CARE_PROVIDER_SITE_OTHER): Payer: PPO

## 2022-08-29 ENCOUNTER — Encounter: Payer: Self-pay | Admitting: Orthopedic Surgery

## 2022-08-29 ENCOUNTER — Ambulatory Visit: Payer: PPO | Admitting: Orthopedic Surgery

## 2022-08-29 VITALS — BP 146/69 | HR 74 | Ht 63.0 in | Wt 131.0 lb

## 2022-08-29 DIAGNOSIS — M5010 Cervical disc disorder with radiculopathy, unspecified cervical region: Secondary | ICD-10-CM

## 2022-08-29 DIAGNOSIS — M4712 Other spondylosis with myelopathy, cervical region: Secondary | ICD-10-CM

## 2022-08-29 NOTE — Progress Notes (Signed)
Orthopedic Spine Surgery Office Note  Assessment: Patient is a 77 y.o. female with history of C5/6 ACDF.  Has adjacent segment degeneration with symptoms of imbalance that have progressed, concern for myelopathy   Plan: -Explained that initially conservative treatment is tried as a significant number of patients may experience relief with these treatment modalities. Discussed that the conservative treatments include:  -activity modification  -physical therapy  -over the counter pain medications  -medrol dosepak  -cervical steroid injections -Patient has tried trigger point injections -Recommended MRI of the cervical spine to evaluate for myelopathy -Patient should return to office in 4 weeks, x-rays at next visit: None   Patient expressed understanding of the plan and all questions were answered to the patient's satisfaction.   ___________________________________________________________________________   History:  Patient is a 77 y.o. female who presents today for cervical spine.  Patient comes in today to establish care and follow-up on her C5/6 ACDF that was done with Dr. Danielle Dess.  She states she has had pain radiating along the inferior border of the scapula on the right side.  This has been present for 6 months.  No other pain radiating into either upper extremity.  There is no trauma or injury that preceded the onset of pain.  She is not having any neck pain.  She has noticed worsening balance over the last 6 months.  She states that she will nearly fall and have to hold onto the wall for stability.   Weakness: Denies Difficulty with fine motor skills (e.g., buttoning shirts, handwriting): Denies Symptoms of imbalance: Yes, states she has had worsening balance over the last 6 months.  She cannot walk in a straight line Paresthesias and numbness: Denies Bowel or bladder incontinence: Denies Saddle anesthesia: Denies  Treatments tried: Trigger point injections  Review of  systems: Denies fevers and chills, night sweats, unexplained weight loss, history of cancer, pain that wakes them at night  Past medical history: Osteoporosis (on Fosamax) Depression GERD  Allergies: NKDA  Past surgical history:  C5/6 ACDF  Social history: Denies use of nicotine product (smoking, vaping, patches, smokeless) Alcohol use: Denies Denies recreational drug use  Physical Exam:  General: no acute distress, appears stated age Neurologic: alert, answering questions appropriately, following commands Respiratory: unlabored breathing on room air, symmetric chest rise Psychiatric: appropriate affect, normal cadence to speech   MSK (spine):  -Strength exam      Left  Right Grip strength                5/5  5/5 Interosseus   5/5   5/5 Wrist extension  5/5  5/5 Wrist flexion   5/5  5/5 Elbow flexion   5/5  5/5 Deltoid    5/5  5/5  EHL    5/5  5/5 TA    5/5  5/5 GSC    5/5  5/5 Knee extension  5/5  5/5 Hip flexion   5/5  5/5  -Sensory exam    Sensation intact to light touch in L3-S1 nerve distributions of bilateral lower extremities  Sensation intact to light touch in C5-T1 nerve distributions of bilateral upper extremities  -Brachioradialis DTR: 2/4 on the left, 2/4 on the right -Biceps DTR: 2/4 on the left, 2/4 on the right -Achilles DTR: 2/4 on the left, 2/4 on the right -Patellar tendon DTR: 2/4 on the left, 2/4 on the right  -Spurling: negative bilaterally -Hoffman sign: negative bilaterally -Clonus: no beats bilaterally -Interosseous wasting: none seen -Grip and release test:  negative -Romberg: positive -Gait: wide based -Imbalance with tandem gait: yes  Left shoulder exam: no pain through range of motion Right shoulder exam: no pain through range of motion   Imaging: XR of the cervical spine from 08/29/2022 was independently reviewed and interpreted, showing disc height loss at C4/5 and C6/7.  ACDF with anterior instrumentation at C5/6 with no  evidence of complication.  No fracture or dislocation seen.  No evidence of instability on flexion/extension views.   Patient name: Taylor Taylor Patient MRN: [id number] Date of visit: 08/29/22

## 2022-08-30 ENCOUNTER — Ambulatory Visit
Admission: RE | Admit: 2022-08-30 | Discharge: 2022-08-30 | Disposition: A | Discharge status: Home / Self Care | Payer: PPO | Source: Ambulatory Visit | Attending: Orthopedic Surgery | Admitting: Orthopedic Surgery

## 2022-08-30 DIAGNOSIS — M4802 Spinal stenosis, cervical region: Secondary | ICD-10-CM | POA: Diagnosis not present

## 2022-08-30 DIAGNOSIS — M4712 Other spondylosis with myelopathy, cervical region: Secondary | ICD-10-CM

## 2022-08-31 ENCOUNTER — Other Ambulatory Visit: Payer: PPO

## 2022-08-31 ENCOUNTER — Telehealth: Payer: Self-pay | Admitting: Radiology

## 2022-08-31 NOTE — Telephone Encounter (Signed)
Patient would like to know if she can get repeat right shoulder injection? She is leaving to go on vacation for 10 days on Saturday and would like to have a repeat injection, if possible, prior to going.  She states that she did get some relief from previous injection, however, her shoulder is still hurting.  Please advise.  CB [phone]

## 2022-09-02 ENCOUNTER — Ambulatory Visit: Payer: PPO | Admitting: Orthopedic Surgery

## 2022-09-02 ENCOUNTER — Ambulatory Visit (INDEPENDENT_AMBULATORY_CARE_PROVIDER_SITE_OTHER): Payer: PPO | Admitting: Sports Medicine

## 2022-09-02 DIAGNOSIS — M5412 Radiculopathy, cervical region: Secondary | ICD-10-CM | POA: Diagnosis not present

## 2022-09-02 DIAGNOSIS — M25511 Pain in right shoulder: Secondary | ICD-10-CM

## 2022-09-02 DIAGNOSIS — M899 Disorder of bone, unspecified: Secondary | ICD-10-CM

## 2022-09-02 MED ORDER — LIDOCAINE HCL 1 % IJ SOLN
1.0000 mL | INTRAMUSCULAR | Status: AC | PRN
Start: 2022-09-02 — End: 2022-09-02
  Administered 2022-09-02: 1 mL

## 2022-09-02 MED ORDER — BUPIVACAINE HCL 0.25 % IJ SOLN
1.0000 mL | INTRAMUSCULAR | Status: AC | PRN
Start: 2022-09-02 — End: 2022-09-02
  Administered 2022-09-02: 1 mL

## 2022-09-02 MED ORDER — METHYLPREDNISOLONE ACETATE 40 MG/ML IJ SUSP
40.0000 mg | INTRAMUSCULAR | Status: AC | PRN
Start: 2022-09-02 — End: 2022-09-02
  Administered 2022-09-02: 40 mg via INTRAMUSCULAR

## 2022-09-02 NOTE — Progress Notes (Signed)
   Procedure Note  Sent over by Dr. Christell Constant for trigger point injections only.  [name]             Date of Birth: 10-May-1945           MRN: [id number]             Visit Date: 09/02/2022  Procedures: Visit Diagnoses:  1. Trigger point of right shoulder region   2. Scapular dysfunction     Trigger Point Inj  Date/Time: 09/02/2022 10:31 AM  Performed by: Madelyn Brunner, DO Authorized by: Madelyn Brunner, DO   Consent Given by:  Patient Site marked: the procedure site was marked   Timeout: prior to procedure the correct patient, procedure, and site was verified   Indications:  Muscle spasm and pain Total # of Trigger Points:  3 or more Location: back and shoulder   Needle Size:  25 G Approach:  Dorsal Medications #1:  1 mL lidocaine 1 %; 1 mL bupivacaine 0.25 %; 40 mg methylPREDNISolone acetate 40 MG/ML Medications #2:  1 mL lidocaine 1 %; 1 mL bupivacaine 0.25 % Medications #3:  1 mL lidocaine 1 %; 1 mL bupivacaine 0.25 % Additional Injections?: No   Patient tolerance:  Patient tolerated the procedure well with no immediate complications Comments: Procedure: Trigger point injections (3), right scapular border and lower trapezius After discussion on R/B/I and informed verbal consent was obtained, a timeout was conducted. The patient was placed in a prone position on the examination table and the area of maximal tenderness was identified over the right scapular border musculature and right lower trapezius.  This area was cleansed with multiple alcohol swabs. Ethyl chloride was used for local anesthesia. Using a 25-gauge 1.5 inch needle the trigger point(s) was subsequently injected with a mixture of 1 cc of methylprednisolone 40 mg/mL and 3 cc of 1% lidocaine without epinephrine, 3 cc of bupivicaine 0.25% with a total of about 2cc of injectate into each trigger point. A band-aid was applied following. Patient tolerated procedure well, there were no post-injection  complications. Post-procedure instructions were given.    - tolerated procedure well - follow-up with myself or Dr. Christell Constant as needed  Madelyn Brunner, DO Primary Care Sports Medicine Physician  [facility] - Orthopedics  This note was dictated using Dragon naturally speaking software and may contain errors in syntax, spelling, or content which have not been identified prior to signing this note.

## 2022-09-02 NOTE — Progress Notes (Signed)
Orthopedic Spine Surgery Office Note   Assessment: Patient is a 77 y.o. female with history of C5/6 ACDF.  Has adjacent segment degeneration and foraminal stenosis at C6/7 that may be causing her right scapular pain. No radiographic concern for myelopathy     Plan: -Patient has tried trigger point injections. She did get good relief with her first injection, so she was interested in another. I think this is a reasonable option for her. I told her if those stop working, then the next treatment I would probably have her try is a cervical ESI -I told her that I do not think that her imbalance is coming from her cervical spine based on her MRI -Patient should return to office on an as needed basis     Patient expressed understanding of the plan and all questions were answered to the patient's satisfaction.    ___________________________________________________________________________     History:   Patient is a 77 y.o. female who presents today for routine follow-up on her cervical spine.  Patient has a history of a C5/6 ACDF with Dr. Danielle Dess.  She has now had pain radiating along the inferior border of her scapula for the last 6 months.  She does not have any pain radiating further down into the right upper extremity.  She has no symptoms in the left upper extremity.  She has previously done trigger point injections and states she did get good but not complete relief with those injections.  She comes in today with no change in her symptoms but wanted to discuss her recent MRI.     Treatments tried: Trigger point injections    Physical Exam:   General: no acute distress, appears stated age Neurologic: alert, answering questions appropriately, following commands Respiratory: unlabored breathing on room air, symmetric chest rise Psychiatric: appropriate affect, normal cadence to speech     MSK (spine):   -Strength exam                                                   Left                   Right Grip strength                5/5                  5/5 Interosseus                  5/5                  5/5 Wrist extension            5/5                  5/5 Wrist flexion                 5/5                  5/5 Elbow flexion                5/5                  5/5 Deltoid  5/5                  5/5     -Sensory exam                          Sensation intact to light touch in C5-T1 nerve distributions of bilateral upper extremities   -Brachioradialis DTR: 2/4 on the left, 2/4 on the right -Biceps DTR: 2/4 on the left, 2/4 on the right   -Spurling: negative bilaterally -Hoffman sign: negative bilaterally -Clonus: no beats bilaterally -Interosseous wasting: none seen -Grip and release test: negative -Romberg: positive -Gait: wide based -Imbalance with tandem gait: yes   Left shoulder exam: no pain through range of motion Right shoulder exam: no pain through range of motion     Imaging: XR of the cervical spine from 08/29/2022 was previously independently reviewed and interpreted, showing disc height loss at C4/5 and C6/7.  ACDF with anterior instrumentation at C5/6 with no evidence of complication.  No fracture or dislocation seen.  No evidence of instability on flexion/extension views.   MRI of the cervical spine from 08/30/2022 was independently reviewed and interpreted, showing prior ACDF at C5/6 with no foraminal or central stenosis at that level.  DDD at C4/5 and C6/7.  There is a disc bulge at C6/7.  Bilateral foraminal stenosis at C6/7.  No significant central stenosis.  No T2 cord signal change.   Patient name: Taylor Taylor Patient MRN: [id number] Date of visit: 09/02/22

## 2022-09-09 ENCOUNTER — Ambulatory Visit: Payer: PPO | Admitting: Sports Medicine

## 2022-09-13 ENCOUNTER — Ambulatory Visit: Payer: PPO | Admitting: Physician Assistant

## 2022-09-16 ENCOUNTER — Ambulatory Visit: Payer: PPO | Admitting: Physician Assistant

## 2022-09-20 ENCOUNTER — Other Ambulatory Visit: Payer: Self-pay | Admitting: Family Medicine

## 2022-09-20 DIAGNOSIS — Z1231 Encounter for screening mammogram for malignant neoplasm of breast: Secondary | ICD-10-CM

## 2022-10-31 ENCOUNTER — Encounter: Payer: Self-pay | Admitting: Family Medicine

## 2022-10-31 ENCOUNTER — Ambulatory Visit (INDEPENDENT_AMBULATORY_CARE_PROVIDER_SITE_OTHER): Payer: PPO | Admitting: Family Medicine

## 2022-10-31 ENCOUNTER — Ambulatory Visit
Admission: RE | Admit: 2022-10-31 | Discharge: 2022-10-31 | Disposition: A | Discharge status: Home / Self Care | Payer: PPO | Source: Ambulatory Visit | Attending: Family Medicine | Admitting: Family Medicine

## 2022-10-31 VITALS — BP 120/62 | HR 84 | Temp 98.9°F | Ht 63.0 in | Wt 130.6 lb

## 2022-10-31 DIAGNOSIS — R195 Other fecal abnormalities: Secondary | ICD-10-CM | POA: Diagnosis not present

## 2022-10-31 DIAGNOSIS — R197 Diarrhea, unspecified: Secondary | ICD-10-CM

## 2022-10-31 NOTE — Progress Notes (Signed)
Subjective:    Patient ID: Taylor Taylor, female    DOB: 07/06/1945, 77 y.o.   MRN: [id number]  Diarrhea     Patient reports a change in stool caliber for the last 2 months.  She states that the stool is much smaller in diameter roughly the diameter of her index finger.  It is also very small.  However the frequency in which she is having to go to the bathroom has increased to several times a day.  Furthermore she reports urgency.  She states that occasionally, the urge to defecate will be so strong that she feels like she has not been to be able to make it to the bathroom in time.  She never feels like she is completely emptying her colon.  She states that she typically feels like she has to go to the bathroom.  She denies any blood in her stool.  She denies any fevers or chills.  She denies any nausea or vomiting.  Her weight is stable.  She denies any recent travel. Past Medical History:  Diagnosis Date   Arthritis    back   Fatigue 11/10/2021   GERD (gastroesophageal reflux disease)    in past   Hypertriglyceridemia without hypercholesterolemia 09/03/2013   Mild cognitive impairment of uncertain or unknown etiology 08/16/2022   Neck pain on right side 12/25/2018   Osteopenia 11/16/2015   h/o bisphos use in records. PCP manages, dexa normal 2020, per pt as of 2022, osteopenia again, advised to f/u with PCP to discussed tx plan   Pain in left hand 05/18/2018   Pain in right elbow 10/15/2020   Pain in right hand 05/18/2018   Pain in right hip 04/30/2019   Pain in right shoulder 12/11/2018   Precordial chest pain 08/31/2009   Seasonal allergies    Unilateral primary osteoarthritis, right knee 08/24/2016   Viral gastroenteritis 07/13/2022   Past Surgical History:  Procedure Laterality Date   BACK SURGERY     Scheduled for 08/19/2021., Dr, Caralee Ates.   CERVICAL DISC SURGERY     over 10 years   COLONOSCOPY     Current Outpatient Medications on File Prior to Visit  Medication  Sig Dispense Refill   acetaminophen (TYLENOL) 500 MG tablet Take 500 mg by mouth 2 (two) times daily.     alendronate (FOSAMAX) 70 MG tablet TAKE 1 TABLET BY MOUTH ONCE WEEKLY BEFORE THE FIRST FOOD, BEVERAGE OR MEDICINE OF THE DAY WITH PLAIN WATER 12 tablet 3   Calcium Carbonate-Vit D-Min (CALCIUM 1200 PO) Take 1 tablet by mouth in the morning and at bedtime.     cholecalciferol (VITAMIN D3) 25 MCG (1000 UNIT) tablet Take 125 mcg by mouth daily.     diclofenac (VOLTAREN) 75 MG EC tablet Take 1 tablet (75 mg total) by mouth 2 (two) times daily. 90 tablet 2   ferrous sulfate 325 (65 FE) MG tablet Take 325 mg by mouth daily with breakfast.     Fish Oil-Cholecalciferol (FISH OIL + D3) 1200-1000 MG-UNIT CAPS Take 1 tablet by mouth daily.     Glucosamine-Chondroit-Vit C-Mn (GLUCOSAMINE CHONDR 1500 COMPLX) CAPS Take 1 capsule by mouth 2 (two) times daily.     Lifitegrast (XIIDRA) 5 % SOLN Apply to eye.     Multiple Vitamins-Minerals (WOMENS MULTI VITAMIN & MINERAL PO) Take 1 tablet by mouth daily.     Red Yeast Rice 600 MG CAPS Take 600 mg by mouth daily.      Turmeric 500 MG  TABS Take 500 mg by mouth daily.      venlafaxine XR (EFFEXOR XR) 37.5 MG 24 hr capsule Take 1 capsule (37.5 mg total) by mouth daily with breakfast. For 2 weeks, then stop. 14 capsule 0   vitamin B-12 (CYANOCOBALAMIN) 100 MCG tablet Take 100 mcg by mouth daily.     vitamin E 100 UNIT capsule Take 100 Units by mouth daily.     Current Facility-Administered Medications on File Prior to Visit  Medication Dose Route Frequency Provider Last Rate Last Admin   0.9 %  sodium chloride infusion  500 mL Intravenous Continuous Pyrtle, Carie Caddy, MD       Allergies  Allergen Reactions   Pollen Extract Itching    sneezing   Social History   Socioeconomic History   Marital status: Married    Spouse name: Not on file   Number of children: 2   Years of education: 13   Highest education level: Associate degree: occupational, Scientist, product/process development, or  vocational program  Occupational History   Occupation: Retired  Tobacco Use   Smoking status: Never   Smokeless tobacco: Never  Vaping Use   Vaping status: Never Used  Substance and Sexual Activity   Alcohol use: No   Drug use: No   Sexual activity: Not on file  Other Topics Concern   Not on file  Social History Narrative   Right handed   Drinks coffee   Lives with husband   2 story home   retired   International aid/development worker of Corporate investment banker Strain: Low Risk  (07/14/2022)   Overall Financial Resource Strain (CARDIA)    Difficulty of Paying Living Expenses: Not hard at all  Food Insecurity: No Food Insecurity (07/14/2022)   Hunger Vital Sign    Worried About Running Out of Food in the Last Year: Never true    Ran Out of Food in the Last Year: Never true  Transportation Needs: No Transportation Needs (07/14/2022)   PRAPARE - Administrator, Civil Service (Medical): No    Lack of Transportation (Non-Medical): No  Physical Activity: Sufficiently Active (07/14/2022)   Exercise Vital Sign    Days of Exercise per Week: 4 days    Minutes of Exercise per Session: 40 min  Stress: No Stress Concern Present (07/14/2022)   [employer] of Occupational Health - Occupational Stress Questionnaire    Feeling of Stress : Not at all  Social Connections: Socially Integrated (07/14/2022)   Social Connection and Isolation Panel [NHANES]    Frequency of Communication with Friends and Family: Three times a week    Frequency of Social Gatherings with Friends and Family: More than three times a week    Attends Religious Services: More than 4 times per year    Active Member of [employer] or Organizations: Yes    Attends Engineer, structural: More than 4 times per year    Marital Status: Married  Catering manager Violence: Not At Risk (07/14/2022)   Humiliation, Afraid, Rape, and Kick questionnaire    Fear of Current or Ex-Partner: No    Emotionally Abused: No     Physically Abused: No    Sexually Abused: No    Past Medical History:  Diagnosis Date   Arthritis    back   Fatigue 11/10/2021   GERD (gastroesophageal reflux disease)    in past   Hypertriglyceridemia without hypercholesterolemia 09/03/2013   Mild cognitive impairment of uncertain or unknown etiology 08/16/2022  Neck pain on right side 12/25/2018   Osteopenia 11/16/2015   h/o bisphos use in records. PCP manages, dexa normal 2020, per pt as of 2022, osteopenia again, advised to f/u with PCP to discussed tx plan   Pain in left hand 05/18/2018   Pain in right elbow 10/15/2020   Pain in right hand 05/18/2018   Pain in right hip 04/30/2019   Pain in right shoulder 12/11/2018   Precordial chest pain 08/31/2009   Seasonal allergies    Unilateral primary osteoarthritis, right knee 08/24/2016   Viral gastroenteritis 07/13/2022   Past Surgical History:  Procedure Laterality Date   BACK SURGERY     Scheduled for 08/19/2021., Dr, Caralee Ates.   CERVICAL DISC SURGERY     over 10 years   COLONOSCOPY     Current Outpatient Medications on File Prior to Visit  Medication Sig Dispense Refill   acetaminophen (TYLENOL) 500 MG tablet Take 500 mg by mouth 2 (two) times daily.     alendronate (FOSAMAX) 70 MG tablet TAKE 1 TABLET BY MOUTH ONCE WEEKLY BEFORE THE FIRST FOOD, BEVERAGE OR MEDICINE OF THE DAY WITH PLAIN WATER 12 tablet 3   Calcium Carbonate-Vit D-Min (CALCIUM 1200 PO) Take 1 tablet by mouth in the morning and at bedtime.     cholecalciferol (VITAMIN D3) 25 MCG (1000 UNIT) tablet Take 125 mcg by mouth daily.     diclofenac (VOLTAREN) 75 MG EC tablet Take 1 tablet (75 mg total) by mouth 2 (two) times daily. 90 tablet 2   ferrous sulfate 325 (65 FE) MG tablet Take 325 mg by mouth daily with breakfast.     Fish Oil-Cholecalciferol (FISH OIL + D3) 1200-1000 MG-UNIT CAPS Take 1 tablet by mouth daily.     Glucosamine-Chondroit-Vit C-Mn (GLUCOSAMINE CHONDR 1500 COMPLX) CAPS Take 1 capsule by mouth  2 (two) times daily.     Lifitegrast (XIIDRA) 5 % SOLN Apply to eye.     Multiple Vitamins-Minerals (WOMENS MULTI VITAMIN & MINERAL PO) Take 1 tablet by mouth daily.     Red Yeast Rice 600 MG CAPS Take 600 mg by mouth daily.      Turmeric 500 MG TABS Take 500 mg by mouth daily.      venlafaxine XR (EFFEXOR XR) 37.5 MG 24 hr capsule Take 1 capsule (37.5 mg total) by mouth daily with breakfast. For 2 weeks, then stop. 14 capsule 0   vitamin B-12 (CYANOCOBALAMIN) 100 MCG tablet Take 100 mcg by mouth daily.     vitamin E 100 UNIT capsule Take 100 Units by mouth daily.     Current Facility-Administered Medications on File Prior to Visit  Medication Dose Route Frequency Provider Last Rate Last Admin   0.9 %  sodium chloride infusion  500 mL Intravenous Continuous Pyrtle, Carie Caddy, MD       Allergies  Allergen Reactions   Pollen Extract Itching    sneezing   Social History   Socioeconomic History   Marital status: Married    Spouse name: Not on file   Number of children: 2   Years of education: 13   Highest education level: Associate degree: occupational, Scientist, product/process development, or vocational program  Occupational History   Occupation: Retired  Tobacco Use   Smoking status: Never   Smokeless tobacco: Never  Vaping Use   Vaping status: Never Used  Substance and Sexual Activity   Alcohol use: No   Drug use: No   Sexual activity: Not on file  Other Topics Concern  Not on file  Social History Narrative   Right handed   Drinks coffee   Lives with husband   2 story home   retired   International aid/development worker of Corporate investment banker Strain: Low Risk  (07/14/2022)   Overall Financial Resource Strain (CARDIA)    Difficulty of Paying Living Expenses: Not hard at all  Food Insecurity: No Food Insecurity (07/14/2022)   Hunger Vital Sign    Worried About Running Out of Food in the Last Year: Never true    Ran Out of Food in the Last Year: Never true  Transportation Needs: No Transportation Needs  (07/14/2022)   PRAPARE - Administrator, Civil Service (Medical): No    Lack of Transportation (Non-Medical): No  Physical Activity: Sufficiently Active (07/14/2022)   Exercise Vital Sign    Days of Exercise per Week: 4 days    Minutes of Exercise per Session: 40 min  Stress: No Stress Concern Present (07/14/2022)   [employer] of Occupational Health - Occupational Stress Questionnaire    Feeling of Stress : Not at all  Social Connections: Socially Integrated (07/14/2022)   Social Connection and Isolation Panel [NHANES]    Frequency of Communication with Friends and Family: Three times a week    Frequency of Social Gatherings with Friends and Family: More than three times a week    Attends Religious Services: More than 4 times per year    Active Member of [employer] or Organizations: Yes    Attends Banker Meetings: More than 4 times per year    Marital Status: Married  Catering manager Violence: Not At Risk (07/14/2022)   Humiliation, Afraid, Rape, and Kick questionnaire    Fear of Current or Ex-Partner: No    Emotionally Abused: No    Physically Abused: No    Sexually Abused: No      Review of Systems  Gastrointestinal:  Positive for diarrhea.  All other systems reviewed and are negative.      Objective:   Physical Exam Vitals reviewed.  Constitutional:      General: She is not in acute distress.    Appearance: She is well-developed. She is not diaphoretic.  HENT:     Head: Normocephalic and atraumatic.     Right Ear: External ear normal.     Left Ear: External ear normal.     Nose: Nose normal.     Mouth/Throat:     Pharynx: No oropharyngeal exudate.  Eyes:     Conjunctiva/sclera: Conjunctivae normal.     Pupils: Pupils are equal, round, and reactive to light.  Neck:     Thyroid: No thyromegaly.     Vascular: No JVD.     Trachea: No tracheal deviation.  Cardiovascular:     Rate and Rhythm: Normal rate and regular rhythm.     Heart  sounds: Normal heart sounds. No murmur heard.    No friction rub. No gallop.  Pulmonary:     Effort: Pulmonary effort is normal. No respiratory distress.     Breath sounds: Normal breath sounds. No wheezing or rales.  Chest:     Chest wall: No tenderness.  Abdominal:     General: Abdomen is flat. Bowel sounds are normal. There is no distension.     Palpations: Abdomen is soft. There is no mass.     Tenderness: There is abdominal tenderness in the right lower quadrant. There is no guarding or rebound.  Musculoskeletal:  General: No tenderness. Normal range of motion.     Cervical back: Neck supple.  Lymphadenopathy:     Cervical: No cervical adenopathy.  Skin:    Findings: No rash.  Neurological:     Mental Status: She is alert and oriented to person, place, and time.     Cranial Nerves: No cranial nerve deficit.     Motor: No abnormal muscle tone.     Coordination: Coordination normal.     Deep Tendon Reflexes: Reflexes normal.           Assessment & Plan:  Change in stool caliber - Plan: DG Abd 2 Views  Diarrhea, unspecified type - Plan: Salmonella/Shigella Cult, Campy EIA and Shiga Toxin reflex, Ova and parasite examination  I suspect that the patient may actually have overflow diarrhea due to constipation.  She may be backed up and therefore the stool with small diameter due to a narrow aperture.  Furthermore that would increase the pressure with which it leaves her system causing urgency.  The other possibility would be malabsorptive diarrhea or intestinal spasms.  Obtain stool cultures as well as ova and parasite examination.  Also check an abdominal x-ray.  If x-ray shows large stool burden, I will likely try a laxative to "clean the patient out" to see if this will resolve the problem.  If nothing shows an answer, we will likely recommend a colonoscopy to rule out a blockage.  Last colonoscopy was in 2018 and was normal

## 2022-11-07 ENCOUNTER — Ambulatory Visit
Admission: RE | Admit: 2022-11-07 | Discharge: 2022-11-07 | Disposition: A | Discharge status: Home / Self Care | Payer: PPO | Source: Ambulatory Visit | Attending: Family Medicine | Admitting: Family Medicine

## 2022-11-07 DIAGNOSIS — Z1231 Encounter for screening mammogram for malignant neoplasm of breast: Secondary | ICD-10-CM

## 2022-11-24 ENCOUNTER — Other Ambulatory Visit: Payer: Self-pay

## 2022-11-24 DIAGNOSIS — R195 Other fecal abnormalities: Secondary | ICD-10-CM

## 2022-11-24 DIAGNOSIS — R197 Diarrhea, unspecified: Secondary | ICD-10-CM

## 2022-11-25 ENCOUNTER — Other Ambulatory Visit: Payer: Self-pay | Admitting: Family Medicine

## 2022-11-25 MED ORDER — DICLOFENAC SODIUM 75 MG PO TBEC: 75.0000 mg | DELAYED_RELEASE_TABLET | Freq: Two times a day (BID) | ORAL | 2 refills | Status: DC

## 2022-11-28 ENCOUNTER — Encounter: Payer: Self-pay | Admitting: Gastroenterology

## 2022-12-13 ENCOUNTER — Other Ambulatory Visit: Payer: PPO

## 2022-12-13 DIAGNOSIS — R5382 Chronic fatigue, unspecified: Secondary | ICD-10-CM | POA: Diagnosis not present

## 2022-12-13 DIAGNOSIS — E781 Pure hyperglyceridemia: Secondary | ICD-10-CM | POA: Diagnosis not present

## 2022-12-13 DIAGNOSIS — R5383 Other fatigue: Secondary | ICD-10-CM

## 2022-12-14 LAB — COMPLETE METABOLIC PANEL WITH GFR
AG Ratio: 2 (calc) (ref 1.0–2.5)
ALT: 11 U/L (ref 6–29)
AST: 12 U/L (ref 10–35)
Albumin: 4.3 g/dL (ref 3.6–5.1)
Alkaline phosphatase (APISO): 30 U/L — ABNORMAL LOW (ref 37–153)
BUN/Creatinine Ratio: 30 (calc) — ABNORMAL HIGH (ref 6–22)
BUN: 27 mg/dL — ABNORMAL HIGH (ref 7–25)
CO2: 27 mmol/L (ref 20–32)
Calcium: 9.5 mg/dL (ref 8.6–10.4)
Chloride: 104 mmol/L (ref 98–110)
Creat: 0.9 mg/dL (ref 0.60–1.00)
Globulin: 2.1 g/dL (calc) (ref 1.9–3.7)
Glucose, Bld: 83 mg/dL (ref 65–99)
Potassium: 4.6 mmol/L (ref 3.5–5.3)
Sodium: 139 mmol/L (ref 135–146)
Total Bilirubin: 0.4 mg/dL (ref 0.2–1.2)
Total Protein: 6.4 g/dL (ref 6.1–8.1)
eGFR: 66 mL/min/{1.73_m2} (ref >=60)

## 2022-12-14 LAB — LIPID PANEL
Cholesterol: 198 mg/dL (ref <200)
HDL: 80 mg/dL (ref >=50)
LDL Cholesterol (Calc): 103 mg/dL (calc) — ABNORMAL HIGH
Non-HDL Cholesterol (Calc): 118 mg/dL (calc) (ref <130)
Total CHOL/HDL Ratio: 2.5 (calc) (ref <5.0)
Triglycerides: 61 mg/dL (ref <150)

## 2022-12-14 LAB — CBC WITH DIFFERENTIAL/PLATELET
Absolute Monocytes: 738 cells/uL (ref 200–950)
Basophils Absolute: 49 cells/uL (ref 0–200)
Basophils Relative: 0.8 %
Eosinophils Absolute: 348 cells/uL (ref 15–500)
Eosinophils Relative: 5.7 %
HCT: 37.8 % (ref 35.0–45.0)
Hemoglobin: 12.3 g/dL (ref 11.7–15.5)
Lymphs Abs: 2202 cells/uL (ref 850–3900)
MCH: 30.5 pg (ref 27.0–33.0)
MCHC: 32.5 g/dL (ref 32.0–36.0)
MCV: 93.8 fL (ref 80.0–100.0)
MPV: 9.3 fL (ref 7.5–12.5)
Monocytes Relative: 12.1 %
Neutro Abs: 2763 cells/uL (ref 1500–7800)
Neutrophils Relative %: 45.3 %
Platelets: 317 10*3/uL (ref 140–400)
RBC: 4.03 10*6/uL (ref 3.80–5.10)
RDW: 12.1 % (ref 11.0–15.0)
Total Lymphocyte: 36.1 %
WBC: 6.1 10*3/uL (ref 3.8–10.8)

## 2022-12-15 ENCOUNTER — Ambulatory Visit (INDEPENDENT_AMBULATORY_CARE_PROVIDER_SITE_OTHER): Payer: PPO | Admitting: Family Medicine

## 2022-12-15 ENCOUNTER — Encounter: Payer: Self-pay | Admitting: Family Medicine

## 2022-12-15 VITALS — BP 110/68 | HR 78 | Temp 98.4°F | Ht 63.0 in | Wt 132.6 lb

## 2022-12-15 DIAGNOSIS — R413 Other amnesia: Secondary | ICD-10-CM

## 2022-12-15 DIAGNOSIS — Z23 Encounter for immunization: Secondary | ICD-10-CM | POA: Diagnosis not present

## 2022-12-15 DIAGNOSIS — Z0001 Encounter for general adult medical examination with abnormal findings: Secondary | ICD-10-CM | POA: Diagnosis not present

## 2022-12-15 DIAGNOSIS — R197 Diarrhea, unspecified: Secondary | ICD-10-CM

## 2022-12-15 DIAGNOSIS — Z0000 Encounter for general adult medical examination without abnormal findings: Secondary | ICD-10-CM

## 2022-12-15 NOTE — Addendum Note (Signed)
Addended by: Venia Carbon K on: 12/15/2022 12:07 PM   Modules accepted: Orders

## 2022-12-15 NOTE — Progress Notes (Signed)
Subjective:    Patient ID: Taylor Taylor, female    DOB: 1946-03-01, 77 y.o.   MRN: [id number]  HPI  07/2021 Over the last year or so, have seen the patient multiple times with fatigue.  Today, CBC, CMP, TSH, B12 levels involving normal.  Her colonoscopy and mammograms are up-to-date.  There has not been an explanation for her fatigue.  Now she is dealing with severe low back pain.  She has spinal stenosis and her neurosurgeon is planning surgery.  She is not sleeping well due to the pain in her back.  This could be contributing to her fatigue however the fatigue predated the trouble with her back.  She is also worried a tremendous amount about her sister who has failing health.  Therefore there is definitely stress in her life however she does not consider herself depressed.  She still sees GYN.  Therefore the question is whether she continued to have severe fatigue.  She denies any fevers or chills or weight loss.  She does have some nausea.  She is having some diarrhea.  She also has right lower quadrant abdominal pain.  She states the pain is intense and has been getting worse over the last week or so.  She denies any vaginal bleeding.  She denies any hematuria.  She denies any melena or hematochezia.  At that time, my plan   I am concerned about the right lower quadrant abdominal pain.  I will start by obtaining CT scan given the worsening pain over the last week and the severe fatigue.  If the CT scan of the abdomen and pelvis is normal, I feel the best thorough diagnostic test to rule out any occult malignancy or infection.  I will repeat a CBC a CMP and a sedimentation rate.  If the sed rate is normal I feel that that would rule out autoimmune diseases along with normal labs.  I will try the patient on Valium 5 mg p.o. nightly to see if the insomnia could be contributing to her fatigue.  If none of this is helping I would recommend trying Provigil for chronic fatigue syndrome.  Await the  results of the CT scan and the lab work  11/18/21 Patient is here today for follow-up.  CT scan of the abdomen pelvis was unremarkable in May.  Patient continues to endorse severe fatigue despite an extensive work-up.  Labs have been unremarkable on several occasions.  CT scan was unremarkable.  Colonoscopy was negative in 2018.  Essentially the patient reports lack of energy.  We have even tried venlafaxine for possible depression and Provigil for chronic fatigue syndrome however the patient took the Provigil for over a month and saw no benefit despite taking 200 mg a day.  She continues to endorse severe fatigue.  She states that on a daily basis she has very little energy even to get off the couch or do anything.  Her weight remains relatively stable.  Her weight is down 3 pounds from May.  She denies any chest pain.  She denies any shortness of breath.  She had an echocardiogram in February that was normal.  She denies any cough or pleurisy or hemoptysis.  She denies any nausea or vomiting.  She does occasionally have diarrhea once every 2 weeks or so but this is not consistent or heavy.  She denies any joint pains or rash or muscle pains.  At that time, my plan was: At this point, I do not know  what else could be causing her fatigue.  Her diagnostic work-up has been negative today.  Symptoms have remained relatively stable and have been present since prior to December 2021.  Therefore I do not feel that there is a serious life-threatening illness.  I have not screened the patient for adrenal insufficiency.  I will check a baseline cortisol level and will consult endocrinology to see if there is any other hormonal issues that may be causing fatigue that I may not be considering.    06/02/22 Patient is a very pleasant 77 year old Caucasian female who is retired Diplomatic Services operational officer from the [employer] who presents today for complete physical exam.  She mentions chronic fatigue again.  She also reports some memory  loss.  I performed a Mini-Mental status exam.  The patient struggled to perform serial sevens and scored 27 out of 30.  She had some difficult time spelling world backwards as well but she was eventually able to come to the correct answer.  On clock drawing exercises, she did not space the numbers properly and had a large gap between 10 and 12.  She also had an extra hand on the clock when I asked her to place the time at 345.  She drew a long hand to 12, short hand to 3, and a long hand to 7.  Therefore I am very concerned that the patient may be showing some signs of age-related cognitive decline.  We spent the majority of our time today discussing this.  She also had forgotten many of our discussions regarding her chronic fatigue over the last year.  She denies that her husband has noted any memory issues.  She denies forgetting conversations or losing money or leaving on appliances.  Mammogram and colonoscopy are up-to-date.  Immunizations are up-to-date.  Blood work is pending.  Bone density test is due next year  12/15/22 Patient scheduled today's appointment for a complete physical exam.  However she had a physical exam in March.  She continues to endorse fatigue however her diarrhea has improved since she added Metamucil.  She has an appointment to see GI in November.  At that time they will discuss whether she needs a colonoscopy to rule out inflammatory bowel disease.  Her mammogram is up-to-date.  Her bone density test was performed 2 years ago and was excellent.  I will repeat this next year.  She is due for a flu shot.  The remainder of her vaccinations are up-to-date as shown below Immunization History  Administered Date(s) Administered   Fluad Quad(high Dose 65+) 12/06/2018   Hepatitis A 12/10/1997, 09/17/1998   Hepatitis A, Ped/Adol-2 Dose 12/10/1997, 09/17/1998   Hepatitis B 12/10/1997, 01/09/1998, 08/11/1998   Hepatitis B, PED/ADOLESCENT 12/10/1997, 01/09/1998, 08/11/1998   Influenza Inj  Mdck Quad With Preservative 11/19/2016, 11/20/2019   Influenza Split 01/18/2012   Influenza, High Dose Seasonal PF 12/22/2015, 10/27/2016, 12/15/2017, 12/06/2018   Influenza,inj,Quad PF,6+ Mos 01/01/2013, 01/07/2014, 12/30/2014   Influenza-Unspecified 10/27/2016, 12/23/2020, 12/19/2021   PFIZER(Purple Top)SARS-COV-2 Vaccination 04/12/2019, 05/02/2019, 07/20/2020   Pfizer Covid-19 Vaccine Bivalent Booster 78yrs & up 12/23/2020   Pneumococcal Conjugate-13 07/17/2014   Pneumococcal Polysaccharide-23 07/12/2011   Td 12/09/1997   Tdap 07/12/2011   Zoster Recombinant(Shingrix) 08/12/2016, 12/24/2016   Zoster, Live 02/26/2010   Her most recent lab work is listed below: Lab on 12/13/2022  Component Date Value Ref Range Status   WBC 12/13/2022 6.1  3.8 - 10.8 Thousand/uL Final   RBC 12/13/2022 4.03  3.80 - 5.10 Million/uL Final  Hemoglobin 12/13/2022 12.3  11.7 - 15.5 g/dL Final   HCT 78/29/5621 37.8  35.0 - 45.0 % Final   MCV 12/13/2022 93.8  80.0 - 100.0 fL Final   MCH 12/13/2022 30.5  27.0 - 33.0 pg Final   MCHC 12/13/2022 32.5  32.0 - 36.0 g/dL Final   RDW 30/86/5784 12.1  11.0 - 15.0 % Final   Platelets 12/13/2022 317  140 - 400 Thousand/uL Final   MPV 12/13/2022 9.3  7.5 - 12.5 fL Final   Neutro Abs 12/13/2022 2,763  1,500 - 7,800 cells/uL Final   Lymphs Abs 12/13/2022 2,202  850 - 3,900 cells/uL Final   Absolute Monocytes 12/13/2022 738  200 - 950 cells/uL Final   Eosinophils Absolute 12/13/2022 348  15 - 500 cells/uL Final   Basophils Absolute 12/13/2022 49  0 - 200 cells/uL Final   Neutrophils Relative % 12/13/2022 45.3  % Final   Total Lymphocyte 12/13/2022 36.1  % Final   Monocytes Relative 12/13/2022 12.1  % Final   Eosinophils Relative 12/13/2022 5.7  % Final   Basophils Relative 12/13/2022 0.8  % Final   Glucose, Bld 12/13/2022 83  65 - 99 mg/dL Final   Comment: .            Fasting reference interval .    BUN 12/13/2022 27 (H)  7 - 25 mg/dL Final   Creat 69/62/9528  0.90  0.60 - 1.00 mg/dL Final   eGFR 41/32/4401 66  > OR = 60 mL/min/1.22m2 Final   BUN/Creatinine Ratio 12/13/2022 30 (H)  6 - 22 (calc) Final   Sodium 12/13/2022 139  135 - 146 mmol/L Final   Potassium 12/13/2022 4.6  3.5 - 5.3 mmol/L Final   Chloride 12/13/2022 104  98 - 110 mmol/L Final   CO2 12/13/2022 27  20 - 32 mmol/L Final   Calcium 12/13/2022 9.5  8.6 - 10.4 mg/dL Final   Total Protein 02/72/5366 6.4  6.1 - 8.1 g/dL Final   Albumin 44/05/4740 4.3  3.6 - 5.1 g/dL Final   Globulin 59/56/3875 2.1  1.9 - 3.7 g/dL (calc) Final   AG Ratio 12/13/2022 2.0  1.0 - 2.5 (calc) Final   Total Bilirubin 12/13/2022 0.4  0.2 - 1.2 mg/dL Final   Alkaline phosphatase (APISO) 12/13/2022 30 (L)  37 - 153 U/L Final   AST 12/13/2022 12  10 - 35 U/L Final   ALT 12/13/2022 11  6 - 29 U/L Final   Cholesterol 12/13/2022 198  <200 mg/dL Final   HDL 64/33/2951 80  > OR = 50 mg/dL Final   Triglycerides 88/41/6606 61  <150 mg/dL Final   LDL Cholesterol (Calc) 12/13/2022 103 (H)  mg/dL (calc) Final   Comment: Reference range: <100 . Desirable range <100 mg/dL for primary prevention;   <70 mg/dL for patients with CHD or diabetic patients  with > or = 2 CHD risk factors. Marland Kitchen LDL-C is now calculated using the Martin-Hopkins  calculation, which is a validated novel method providing  better accuracy than the Friedewald equation in the  estimation of LDL-C.  Horald Pollen et al. Lenox Ahr. 3016;010(93): 2061-2068  (http://education.[facility].com/faq/FAQ164)    Total CHOL/HDL Ratio 12/13/2022 2.5  <2.3 (calc) Final   Non-HDL Cholesterol (Calc) 12/13/2022 118  <130 mg/dL (calc) Final   Comment: For patients with diabetes plus 1 major ASCVD risk  factor, treating to a non-HDL-C goal of <100 mg/dL  (LDL-C of <55 mg/dL) is considered a therapeutic  option.  Past Medical History:  Diagnosis Date   Arthritis    back   Fatigue 11/10/2021   GERD (gastroesophageal reflux disease)    in past    Hypertriglyceridemia without hypercholesterolemia 09/03/2013   Mild cognitive impairment of uncertain or unknown etiology 08/16/2022   Neck pain on right side 12/25/2018   Osteopenia 11/16/2015   h/o bisphos use in records. PCP manages, dexa normal 2020, per pt as of 2022, osteopenia again, advised to f/u with PCP to discussed tx plan   Pain in left hand 05/18/2018   Pain in right elbow 10/15/2020   Pain in right hand 05/18/2018   Pain in right hip 04/30/2019   Pain in right shoulder 12/11/2018   Precordial chest pain 08/31/2009   Seasonal allergies    Unilateral primary osteoarthritis, right knee 08/24/2016   Viral gastroenteritis 07/13/2022   Past Surgical History:  Procedure Laterality Date   BACK SURGERY     Scheduled for 08/19/2021., Dr, Caralee Ates.   CERVICAL DISC SURGERY     over 10 years   COLONOSCOPY     Current Outpatient Medications on File Prior to Visit  Medication Sig Dispense Refill   acetaminophen (TYLENOL) 500 MG tablet Take 500 mg by mouth 2 (two) times daily.     alendronate (FOSAMAX) 70 MG tablet TAKE 1 TABLET BY MOUTH ONCE WEEKLY BEFORE THE FIRST FOOD, BEVERAGE OR MEDICINE OF THE DAY WITH PLAIN WATER 12 tablet 3   Calcium Carbonate-Vit D-Min (CALCIUM 1200 PO) Take 1 tablet by mouth in the morning and at bedtime.     cholecalciferol (VITAMIN D3) 25 MCG (1000 UNIT) tablet Take 125 mcg by mouth daily.     diclofenac (VOLTAREN) 75 MG EC tablet Take 1 tablet (75 mg total) by mouth 2 (two) times daily. 180 tablet 2   ferrous sulfate 325 (65 FE) MG tablet Take 325 mg by mouth daily with breakfast.     Fish Oil-Cholecalciferol (FISH OIL + D3) 1200-1000 MG-UNIT CAPS Take 1 tablet by mouth daily.     Glucosamine-Chondroit-Vit C-Mn (GLUCOSAMINE CHONDR 1500 COMPLX) CAPS Take 1 capsule by mouth 2 (two) times daily.     Lifitegrast (XIIDRA) 5 % SOLN Apply to eye.     Multiple Vitamins-Minerals (WOMENS MULTI VITAMIN & MINERAL PO) Take 1 tablet by mouth daily.     Red Yeast Rice  600 MG CAPS Take 600 mg by mouth daily.      Turmeric 500 MG TABS Take 500 mg by mouth daily.      venlafaxine XR (EFFEXOR XR) 37.5 MG 24 hr capsule Take 1 capsule (37.5 mg total) by mouth daily with breakfast. For 2 weeks, then stop. 14 capsule 0   vitamin E 100 UNIT capsule Take 100 Units by mouth daily.     vitamin B-12 (CYANOCOBALAMIN) 100 MCG tablet Take 100 mcg by mouth daily. (Patient not taking: Reported on 12/15/2022)     Current Facility-Administered Medications on File Prior to Visit  Medication Dose Route Frequency Provider Last Rate Last Admin   0.9 %  sodium chloride infusion  500 mL Intravenous Continuous Pyrtle, Carie Caddy, MD       Allergies  Allergen Reactions   Pollen Extract Itching    sneezing   Social History   Socioeconomic History   Marital status: Married    Spouse name: Not on file   Number of children: 2   Years of education: 13   Highest education level: Associate degree: occupational, Scientist, product/process development, or vocational program  Occupational  History   Occupation: Retired  Tobacco Use   Smoking status: Never   Smokeless tobacco: Never  Vaping Use   Vaping status: Never Used  Substance and Sexual Activity   Alcohol use: No   Drug use: No   Sexual activity: Not on file  Other Topics Concern   Not on file  Social History Narrative   Right handed   Drinks coffee   Lives with husband   2 story home   retired   International aid/development worker of Corporate investment banker Strain: Low Risk  (07/14/2022)   Overall Financial Resource Strain (CARDIA)    Difficulty of Paying Living Expenses: Not hard at all  Food Insecurity: No Food Insecurity (07/14/2022)   Hunger Vital Sign    Worried About Running Out of Food in the Last Year: Never true    Ran Out of Food in the Last Year: Never true  Transportation Needs: No Transportation Needs (07/14/2022)   PRAPARE - Administrator, Civil Service (Medical): No    Lack of Transportation (Non-Medical): No  Physical Activity:  Sufficiently Active (07/14/2022)   Exercise Vital Sign    Days of Exercise per Week: 4 days    Minutes of Exercise per Session: 40 min  Stress: No Stress Concern Present (07/14/2022)   [employer] of Occupational Health - Occupational Stress Questionnaire    Feeling of Stress : Not at all  Social Connections: Socially Integrated (07/14/2022)   Social Connection and Isolation Panel [NHANES]    Frequency of Communication with Friends and Family: Three times a week    Frequency of Social Gatherings with Friends and Family: More than three times a week    Attends Religious Services: More than 4 times per year    Active Member of [employer] or Organizations: Yes    Attends Engineer, structural: More than 4 times per year    Marital Status: Married  Catering manager Violence: Not At Risk (07/14/2022)   Humiliation, Afraid, Rape, and Kick questionnaire    Fear of Current or Ex-Partner: No    Emotionally Abused: No    Physically Abused: No    Sexually Abused: No    Past Medical History:  Diagnosis Date   Arthritis    back   Fatigue 11/10/2021   GERD (gastroesophageal reflux disease)    in past   Hypertriglyceridemia without hypercholesterolemia 09/03/2013   Mild cognitive impairment of uncertain or unknown etiology 08/16/2022   Neck pain on right side 12/25/2018   Osteopenia 11/16/2015   h/o bisphos use in records. PCP manages, dexa normal 2020, per pt as of 2022, osteopenia again, advised to f/u with PCP to discussed tx plan   Pain in left hand 05/18/2018   Pain in right elbow 10/15/2020   Pain in right hand 05/18/2018   Pain in right hip 04/30/2019   Pain in right shoulder 12/11/2018   Precordial chest pain 08/31/2009   Seasonal allergies    Unilateral primary osteoarthritis, right knee 08/24/2016   Viral gastroenteritis 07/13/2022   Past Surgical History:  Procedure Laterality Date   BACK SURGERY     Scheduled for 08/19/2021., Dr, Caralee Ates.   CERVICAL DISC SURGERY      over 10 years   COLONOSCOPY     Current Outpatient Medications on File Prior to Visit  Medication Sig Dispense Refill   acetaminophen (TYLENOL) 500 MG tablet Take 500 mg by mouth 2 (two) times daily.     alendronate (FOSAMAX) 70  MG tablet TAKE 1 TABLET BY MOUTH ONCE WEEKLY BEFORE THE FIRST FOOD, BEVERAGE OR MEDICINE OF THE DAY WITH PLAIN WATER 12 tablet 3   Calcium Carbonate-Vit D-Min (CALCIUM 1200 PO) Take 1 tablet by mouth in the morning and at bedtime.     cholecalciferol (VITAMIN D3) 25 MCG (1000 UNIT) tablet Take 125 mcg by mouth daily.     diclofenac (VOLTAREN) 75 MG EC tablet Take 1 tablet (75 mg total) by mouth 2 (two) times daily. 180 tablet 2   ferrous sulfate 325 (65 FE) MG tablet Take 325 mg by mouth daily with breakfast.     Fish Oil-Cholecalciferol (FISH OIL + D3) 1200-1000 MG-UNIT CAPS Take 1 tablet by mouth daily.     Glucosamine-Chondroit-Vit C-Mn (GLUCOSAMINE CHONDR 1500 COMPLX) CAPS Take 1 capsule by mouth 2 (two) times daily.     Lifitegrast (XIIDRA) 5 % SOLN Apply to eye.     Multiple Vitamins-Minerals (WOMENS MULTI VITAMIN & MINERAL PO) Take 1 tablet by mouth daily.     Red Yeast Rice 600 MG CAPS Take 600 mg by mouth daily.      Turmeric 500 MG TABS Take 500 mg by mouth daily.      venlafaxine XR (EFFEXOR XR) 37.5 MG 24 hr capsule Take 1 capsule (37.5 mg total) by mouth daily with breakfast. For 2 weeks, then stop. 14 capsule 0   vitamin E 100 UNIT capsule Take 100 Units by mouth daily.     vitamin B-12 (CYANOCOBALAMIN) 100 MCG tablet Take 100 mcg by mouth daily. (Patient not taking: Reported on 12/15/2022)     Current Facility-Administered Medications on File Prior to Visit  Medication Dose Route Frequency Provider Last Rate Last Admin   0.9 %  sodium chloride infusion  500 mL Intravenous Continuous Pyrtle, Carie Caddy, MD       Allergies  Allergen Reactions   Pollen Extract Itching    sneezing   Social History   Socioeconomic History   Marital status: Married     Spouse name: Not on file   Number of children: 2   Years of education: 13   Highest education level: Associate degree: occupational, Scientist, product/process development, or vocational program  Occupational History   Occupation: Retired  Tobacco Use   Smoking status: Never   Smokeless tobacco: Never  Vaping Use   Vaping status: Never Used  Substance and Sexual Activity   Alcohol use: No   Drug use: No   Sexual activity: Not on file  Other Topics Concern   Not on file  Social History Narrative   Right handed   Drinks coffee   Lives with husband   2 story home   retired   International aid/development worker of Health   Financial Resource Strain: Low Risk  (07/14/2022)   Overall Financial Resource Strain (CARDIA)    Difficulty of Paying Living Expenses: Not hard at all  Food Insecurity: No Food Insecurity (07/14/2022)   Hunger Vital Sign    Worried About Running Out of Food in the Last Year: Never true    Ran Out of Food in the Last Year: Never true  Transportation Needs: No Transportation Needs (07/14/2022)   PRAPARE - Administrator, Civil Service (Medical): No    Lack of Transportation (Non-Medical): No  Physical Activity: Sufficiently Active (07/14/2022)   Exercise Vital Sign    Days of Exercise per Week: 4 days    Minutes of Exercise per Session: 40 min  Stress: No Stress Concern Present (  07/14/2022)   [employer] [employer] of Occupational Health - Occupational Stress Questionnaire    Feeling of Stress : Not at all  Social Connections: Socially Integrated (07/14/2022)   Social Connection and Isolation Panel [NHANES]    Frequency of Communication with Friends and Family: Three times a week    Frequency of Social Gatherings with Friends and Family: More than three times a week    Attends Religious Services: More than 4 times per year    Active Member of [employer] or Organizations: Yes    Attends Banker Meetings: More than 4 times per year    Marital Status: Married  Catering manager Violence: Not  At Risk (07/14/2022)   Humiliation, Afraid, Rape, and Kick questionnaire    Fear of Current or Ex-Partner: No    Emotionally Abused: No    Physically Abused: No    Sexually Abused: No      Review of Systems  All other systems reviewed and are negative.      Objective:   Physical Exam Vitals reviewed.  Constitutional:      General: She is not in acute distress.    Appearance: She is well-developed. She is not diaphoretic.  HENT:     Head: Normocephalic and atraumatic.     Right Ear: External ear normal.     Left Ear: External ear normal.     Nose: Nose normal.     Mouth/Throat:     Pharynx: No oropharyngeal exudate.  Eyes:     Conjunctiva/sclera: Conjunctivae normal.     Pupils: Pupils are equal, round, and reactive to light.  Neck:     Thyroid: No thyromegaly.     Vascular: No JVD.     Trachea: No tracheal deviation.  Cardiovascular:     Rate and Rhythm: Normal rate and regular rhythm.     Heart sounds: Normal heart sounds. No murmur heard.    No friction rub. No gallop.  Pulmonary:     Effort: Pulmonary effort is normal. No respiratory distress.     Breath sounds: Normal breath sounds. No wheezing or rales.  Chest:     Chest wall: No tenderness.  Abdominal:     General: Abdomen is flat. Bowel sounds are normal. There is no distension.     Palpations: Abdomen is soft. There is no mass.     Tenderness: There is no abdominal tenderness. There is no guarding or rebound.  Musculoskeletal:        General: No tenderness. Normal range of motion.     Cervical back: Neck supple.  Lymphadenopathy:     Cervical: No cervical adenopathy.  Skin:    Findings: No rash.  Neurological:     Mental Status: She is alert and oriented to person, place, and time.     Cranial Nerves: No cranial nerve deficit.     Sensory: No sensory deficit.     Motor: No weakness or abnormal muscle tone.     Coordination: Coordination normal.     Deep Tendon Reflexes: Reflexes normal.   Psychiatric:        Mood and Affect: Mood normal.        Behavior: Behavior normal.        Thought Content: Thought content normal.        Judgment: Judgment normal.           Assessment & Plan:  Encounter for Medicare annual wellness exam  Memory loss  Diarrhea, unspecified type Patient received her flu  shot today.  The remainder of her immunizations are up-to-date.  Her mammogram and bone density test are up-to-date.  She has an appointment to see GI in November regarding the change in her stool caliber.  However thankfully this has improved since she had a Metamucil.  Her abdomen is increased evaluated kidney damage and I recommended avoiding NSAIDs is much as possible.  Earlier this year I was concerned about some mild age-related cognitive decline.  I still feel that she is dealing with this however it is very mild and advance cognitive testing did not determine any significant dementia.  Regular anticipatory guidance is provided

## 2022-12-16 ENCOUNTER — Telehealth: Payer: Self-pay | Admitting: Physical Medicine and Rehabilitation

## 2022-12-16 DIAGNOSIS — M5416 Radiculopathy, lumbar region: Secondary | ICD-10-CM

## 2022-12-16 NOTE — Telephone Encounter (Signed)
Pt called requesting a call back to set an appt for back injections. Please call pt at [phone].

## 2022-12-20 NOTE — Telephone Encounter (Signed)
Spoke with patient and she is requesting another injection. She is having the same pain as before with radiculopathy in the right leg. Last injection on 02/03/22 lasted until 3 weeks ago with more than 75% relief. No new falls, accidents, or injuries. Please advise

## 2022-12-23 ENCOUNTER — Telehealth: Payer: Self-pay | Admitting: Physical Medicine and Rehabilitation

## 2022-12-23 NOTE — Telephone Encounter (Signed)
Patient called needing to R/S her appointment. The number to contact patient is [phone]

## 2022-12-23 NOTE — Telephone Encounter (Signed)
See MyChart message

## 2023-01-03 ENCOUNTER — Encounter: Payer: PPO | Admitting: Physical Medicine and Rehabilitation

## 2023-01-03 DIAGNOSIS — Z01419 Encounter for gynecological examination (general) (routine) without abnormal findings: Secondary | ICD-10-CM | POA: Diagnosis not present

## 2023-01-04 ENCOUNTER — Encounter: Payer: Self-pay | Admitting: Family Medicine

## 2023-01-05 ENCOUNTER — Other Ambulatory Visit: Payer: Self-pay

## 2023-01-05 ENCOUNTER — Ambulatory Visit: Payer: PPO | Admitting: Physical Medicine and Rehabilitation

## 2023-01-05 ENCOUNTER — Other Ambulatory Visit: Payer: Self-pay | Admitting: Family Medicine

## 2023-01-05 DIAGNOSIS — M5416 Radiculopathy, lumbar region: Secondary | ICD-10-CM | POA: Diagnosis not present

## 2023-01-05 DIAGNOSIS — M810 Age-related osteoporosis without current pathological fracture: Secondary | ICD-10-CM

## 2023-01-05 MED ORDER — METHYLPREDNISOLONE ACETATE 40 MG/ML IJ SUSP
40.0000 mg | Freq: Once | INTRAMUSCULAR | Status: AC
Start: 2023-01-05 — End: 2023-01-05
  Administered 2023-01-05: 40 mg

## 2023-01-05 NOTE — Progress Notes (Signed)
Functional Pain Scale - descriptive words and definitions  No Pain (0)   No Pain/Loss of function  Average Pain 8 ( Pain is only at night when she is on that R side )   +Driver, -BT, -Dye Allergies.  Last injection did help for a while.   Pr 88

## 2023-01-05 NOTE — Patient Instructions (Signed)

## 2023-01-17 NOTE — Progress Notes (Signed)
Taylor Taylor - 77 y.o. female MRN [id number]  Date of birth: Jan 12, 1946  Office Visit Note: Visit Date: 01/05/2023 PCP: [name], MD Referred by: [name], MD  Subjective: Chief Complaint  Patient presents with   Lower Back - Pain    Lower back right leg   HPI:  Taylor Taylor is a 77 y.o. female who comes in today for planned repeat Right L5-S1  Lumbar Transforaminal epidural steroid injection with fluoroscopic guidance.  The patient has failed conservative care including home exercise, medications, time and activity modification.  This injection will be diagnostic and hopefully therapeutic.  Please see requesting physician notes for further details and justification. Patient received more than 50% pain relief from prior injection.   Referring: Ellin Goodie, FNP   ROS Otherwise per HPI.  Assessment & Plan: Visit Diagnoses:    ICD-10-CM   1. Lumbar radiculopathy  M54.16 XR C-ARM NO REPORT    Epidural Steroid injection    methylPREDNISolone acetate (DEPO-MEDROL) injection 40 mg      Plan: No additional findings.   Meds & Orders:  Meds ordered this encounter  Medications   methylPREDNISolone acetate (DEPO-MEDROL) injection 40 mg    Orders Placed This Encounter  Procedures   XR C-ARM NO REPORT   Epidural Steroid injection    Follow-up: Return for visit to requesting provider as needed.   Procedures: No procedures performed  Lumbosacral Transforaminal Epidural Steroid Injection - Sub-Pedicular Approach with Fluoroscopic Guidance  [name]      Date of Birth: [birth year] MRN: [id number] PCP: [name], MD      Visit Date: 01/05/2023   Universal Protocol:    Date/Time: 01/05/2023  Consent Given By: the patient  Position: PRONE  Additional Comments: Vital signs were monitored before and after the procedure. Patient was prepped and draped in the usual sterile fashion. The correct patient, procedure, and  site was verified.   Injection Procedure Details:   Procedure diagnoses: Lumbar radiculopathy [M54.16]    Meds Administered:  Meds ordered this encounter  Medications   methylPREDNISolone acetate (DEPO-MEDROL) injection 40 mg    Laterality: Right  Location/Site: L5  Needle:5.0 in., 22 ga.  Short bevel or Quincke spinal needle  Needle Placement: Transforaminal  Findings:    -Comments: Excellent flow of contrast along the nerve, nerve root and into the epidural space.  Procedure Details: After squaring off the end-plates to get a true AP view, the C-arm was positioned so that an oblique view of the foramen as noted above was visualized. The target area is just inferior to the "nose of the scotty dog" or sub pedicular. The soft tissues overlying this structure were infiltrated with 2-3 ml. of 1% Lidocaine without Epinephrine.  The spinal needle was inserted toward the target using a "trajectory" view along the fluoroscope beam.  Under AP and lateral visualization, the needle was advanced so it did not puncture dura and was located close the 6 O'Clock position of the pedical in AP tracterory. Biplanar projections were used to confirm position. Aspiration was confirmed to be negative for CSF and/or blood. A 1-2 ml. volume of Isovue-250 was injected and flow of contrast was noted at each level. Radiographs were obtained for documentation purposes.   After attaining the desired flow of contrast documented above, a 0.5 to 1.0 ml test dose of 0.25% Marcaine was injected into each respective transforaminal space.  The patient was observed for 90 seconds post injection.  After  no sensory deficits were reported, and normal lower extremity motor function was noted,   the above injectate was administered so that equal amounts of the injectate were placed at each foramen (level) into the transforaminal epidural space.   Additional Comments:  No complications occurred Dressing: 2 x 2 sterile gauze  and Band-Aid    Post-procedure details: Patient was observed during the procedure. Post-procedure instructions were reviewed.  Patient left the clinic in stable condition.    Clinical History: MRI LUMBAR SPINE WITHOUT CONTRAST   TECHNIQUE: Multiplanar, multisequence MR imaging of the lumbar spine was performed. No intravenous contrast was administered.   COMPARISON:  11/07/2017   FINDINGS: Segmentation:  Standard.   Alignment:  Physiologic.   Vertebrae:  No fracture, evidence of discitis, or bone lesion.   Conus medullaris and cauda equina: Conus extends to the L1 level. Conus and cauda equina appear normal.   Paraspinal and other soft tissues: Negative.   Disc levels:   L1-L2: Normal disc space and facet joints. No spinal canal stenosis. No neural foraminal stenosis.   L2-L3: Progression of small disc bulge with facet hypertrophy. No spinal canal stenosis. No neural foraminal stenosis.   L3-L4: Unchanged left asymmetric disc bulge and mild facet hypertrophy. Left lateral recess narrowing without central spinal canal stenosis. No neural foraminal stenosis.   L4-L5: Unchanged right asymmetric disc bulge and superimposed right subarticular protrusion. Right lateral recess narrowing without central spinal canal stenosis. No neural foraminal stenosis.   L5-S1: Disc space narrowing with small bulge and endplate spurring. No spinal canal stenosis. Unchanged moderate bilateral neural foraminal stenosis.   Visualized sacrum: Normal.   IMPRESSION: 1. Unchanged moderate bilateral L5-S1 neural foraminal stenosis. 2. Unchanged right L4-5 lateral recess stenosis. 3. Unchanged left L3-4 lateral recess stenosis.     Electronically Signed   By: Deatra Robinson M.D.   On: 05/19/2021 03:01     Objective:  VS:  HT:    WT:   BMI:     BP:   HR: bpm  TEMP: ( )  RESP:  Physical Exam   Imaging: No results found.

## 2023-01-17 NOTE — Procedures (Signed)
Lumbosacral Transforaminal Epidural Steroid Injection - Sub-Pedicular Approach with Fluoroscopic Guidance  [name]      Date of Birth: 01-Jul-1945 MRN: [id number] PCP: [name], MD      Visit Date: 01/05/2023   Universal Protocol:    Date/Time: 01/05/2023  Consent Given By: the patient  Position: PRONE  Additional Comments: Vital signs were monitored before and after the procedure. Patient was prepped and draped in the usual sterile fashion. The correct patient, procedure, and site was verified.   Injection Procedure Details:   Procedure diagnoses: Lumbar radiculopathy [M54.16]    Meds Administered:  Meds ordered this encounter  Medications   methylPREDNISolone acetate (DEPO-MEDROL) injection 40 mg    Laterality: Right  Location/Site: L5  Needle:5.0 in., 22 ga.  Short bevel or Quincke spinal needle  Needle Placement: Transforaminal  Findings:    -Comments: Excellent flow of contrast along the nerve, nerve root and into the epidural space.  Procedure Details: After squaring off the end-plates to get a true AP view, the C-arm was positioned so that an oblique view of the foramen as noted above was visualized. The target area is just inferior to the "nose of the scotty dog" or sub pedicular. The soft tissues overlying this structure were infiltrated with 2-3 ml. of 1% Lidocaine without Epinephrine.  The spinal needle was inserted toward the target using a "trajectory" view along the fluoroscope beam.  Under AP and lateral visualization, the needle was advanced so it did not puncture dura and was located close the 6 O'Clock position of the pedical in AP tracterory. Biplanar projections were used to confirm position. Aspiration was confirmed to be negative for CSF and/or blood. A 1-2 ml. volume of Isovue-250 was injected and flow of contrast was noted at each level. Radiographs were obtained for documentation purposes.   After attaining the  desired flow of contrast documented above, a 0.5 to 1.0 ml test dose of 0.25% Marcaine was injected into each respective transforaminal space.  The patient was observed for 90 seconds post injection.  After no sensory deficits were reported, and normal lower extremity motor function was noted,   the above injectate was administered so that equal amounts of the injectate were placed at each foramen (level) into the transforaminal epidural space.   Additional Comments:  No complications occurred Dressing: 2 x 2 sterile gauze and Band-Aid    Post-procedure details: Patient was observed during the procedure. Post-procedure instructions were reviewed.  Patient left the clinic in stable condition.

## 2023-01-24 ENCOUNTER — Ambulatory Visit: Payer: PPO | Admitting: Physical Medicine and Rehabilitation

## 2023-01-26 DIAGNOSIS — M8588 Other specified disorders of bone density and structure, other site: Secondary | ICD-10-CM | POA: Diagnosis not present

## 2023-01-26 DIAGNOSIS — Z780 Asymptomatic menopausal state: Secondary | ICD-10-CM | POA: Diagnosis not present

## 2023-01-26 DIAGNOSIS — E2839 Other primary ovarian failure: Secondary | ICD-10-CM | POA: Diagnosis not present

## 2023-01-26 LAB — HM DEXA SCAN

## 2023-02-06 ENCOUNTER — Other Ambulatory Visit: Payer: Self-pay | Admitting: Medical Genetics

## 2023-02-06 DIAGNOSIS — Z006 Encounter for examination for normal comparison and control in clinical research program: Secondary | ICD-10-CM

## 2023-02-09 ENCOUNTER — Ambulatory Visit: Payer: PPO | Admitting: Gastroenterology

## 2023-02-14 ENCOUNTER — Telehealth: Payer: Self-pay

## 2023-02-14 NOTE — Telephone Encounter (Signed)
Copied from CRM [id number]. Topic: Clinical - Lab/Test Results >> Feb 14, 2023  8:49 AM Adelina Mings wrote: Reason for CRM: patient is requesting bone density results

## 2023-02-27 ENCOUNTER — Ambulatory Visit (INDEPENDENT_AMBULATORY_CARE_PROVIDER_SITE_OTHER): Payer: PPO | Admitting: Family Medicine

## 2023-02-27 ENCOUNTER — Encounter: Payer: Self-pay | Admitting: Family Medicine

## 2023-02-27 VITALS — BP 118/68 | HR 67 | Temp 98.9°F | Ht 63.0 in | Wt 133.0 lb

## 2023-02-27 DIAGNOSIS — R519 Headache, unspecified: Secondary | ICD-10-CM

## 2023-02-27 MED ORDER — PREDNISONE 20 MG PO TABS: ORAL_TABLET | ORAL | 0 refills | Status: DC

## 2023-02-27 NOTE — Progress Notes (Signed)
Subjective:    Patient ID: Taylor Taylor, female    DOB: [birth year], 77 y.o.   MRN: [id number] Patient presents today complaining of a 1 week history of headache.  The headache begins on the right side of her neck and radiates up to the crown of her head.  There is a tender area on the crown of her head that hurts to touch.  It is throbbing in nature.  It hurts to turn her head side-to-side.  She denies any numbness or tingling in her arms.  She denies any vision changes.  She denies any photophobia or phonophobia.  She has a history of ACDF in the cervical spine.  MRI obtained earlier this year did show severe degenerative facet arthropathy on the right side at C5-C6.  She also had an MRI of the brain in May which showed atrophy but no intracranial mass or aneurysm.  Past Medical History:  Diagnosis Date   Arthritis    back   Fatigue 11/10/2021   GERD (gastroesophageal reflux disease)    in past   Hypertriglyceridemia without hypercholesterolemia 09/03/2013   Mild cognitive impairment of uncertain or unknown etiology 08/16/2022   Neck pain on right side 12/25/2018   Osteopenia 11/16/2015   h/o bisphos use in records. PCP manages, dexa normal 2020, per pt as of 2022, osteopenia again, advised to f/u with PCP to discussed tx plan   Pain in left hand 05/18/2018   Pain in right elbow 10/15/2020   Pain in right hand 05/18/2018   Pain in right hip 04/30/2019   Pain in right shoulder 12/11/2018   Precordial chest pain 08/31/2009   Seasonal allergies    Unilateral primary osteoarthritis, right knee 08/24/2016   Viral gastroenteritis 07/13/2022   Past Surgical History:  Procedure Laterality Date   BACK SURGERY     Scheduled for 08/19/2021., Dr, Caralee Ates.   CERVICAL DISC SURGERY     over 10 years   COLONOSCOPY     Current Outpatient Medications on File Prior to Visit  Medication Sig Dispense Refill   acetaminophen (TYLENOL) 500 MG tablet Take 500 mg by mouth 2 (two) times daily.      alendronate (FOSAMAX) 70 MG tablet TAKE 1 TABLET BY MOUTH ONCE WEEKLY BEFORE THE FIRST FOOD, BEVERAGE OR MEDICINE OF THE DAY WITH PLAIN WATER 12 tablet 3   Calcium Carbonate-Vit D-Min (CALCIUM 1200 PO) Take 1 tablet by mouth in the morning and at bedtime.     cholecalciferol (VITAMIN D3) 25 MCG (1000 UNIT) tablet Take 125 mcg by mouth daily.     diclofenac (VOLTAREN) 75 MG EC tablet Take 1 tablet (75 mg total) by mouth 2 (two) times daily. 180 tablet 2   ferrous sulfate 325 (65 FE) MG tablet Take 325 mg by mouth daily with breakfast.     Fish Oil-Cholecalciferol (FISH OIL + D3) 1200-1000 MG-UNIT CAPS Take 1 tablet by mouth daily.     Glucosamine-Chondroit-Vit C-Mn (GLUCOSAMINE CHONDR 1500 COMPLX) CAPS Take 1 capsule by mouth 2 (two) times daily.     Lifitegrast (XIIDRA) 5 % SOLN Apply to eye.     Multiple Vitamins-Minerals (WOMENS MULTI VITAMIN & MINERAL PO) Take 1 tablet by mouth daily.     Red Yeast Rice 600 MG CAPS Take 600 mg by mouth daily.      Turmeric 500 MG TABS Take 500 mg by mouth daily.      vitamin B-12 (CYANOCOBALAMIN) 100 MCG tablet Take 100 mcg by mouth daily.  vitamin E 100 UNIT capsule Take 100 Units by mouth daily.     Current Facility-Administered Medications on File Prior to Visit  Medication Dose Route Frequency Provider Last Rate Last Admin   0.9 %  sodium chloride infusion  500 mL Intravenous Continuous Pyrtle, Carie Caddy, MD       Allergies  Allergen Reactions   Pollen Extract Itching    sneezing   Social History   Socioeconomic History   Marital status: Married    Spouse name: Not on file   Number of children: 2   Years of education: 13   Highest education level: Associate degree: occupational, Scientist, product/process development, or vocational program  Occupational History   Occupation: Retired  Tobacco Use   Smoking status: Never   Smokeless tobacco: Never  Vaping Use   Vaping status: Never Used  Substance and Sexual Activity   Alcohol use: No   Drug use: No   Sexual  activity: Not on file  Other Topics Concern   Not on file  Social History Narrative   Right handed   Drinks coffee   Lives with husband   2 story home   retired   International aid/development worker of Corporate investment banker Strain: Low Risk  (07/14/2022)   Overall Financial Resource Strain (CARDIA)    Difficulty of Paying Living Expenses: Not hard at all  Food Insecurity: No Food Insecurity (07/14/2022)   Hunger Vital Sign    Worried About Running Out of Food in the Last Year: Never true    Ran Out of Food in the Last Year: Never true  Transportation Needs: No Transportation Needs (07/14/2022)   PRAPARE - Administrator, Civil Service (Medical): No    Lack of Transportation (Non-Medical): No  Physical Activity: Sufficiently Active (07/14/2022)   Exercise Vital Sign    Days of Exercise per Week: 4 days    Minutes of Exercise per Session: 40 min  Stress: No Stress Concern Present (07/14/2022)   [employer] of Occupational Health - Occupational Stress Questionnaire    Feeling of Stress : Not at all  Social Connections: Socially Integrated (07/14/2022)   Social Connection and Isolation Panel [NHANES]    Frequency of Communication with Friends and Family: Three times a week    Frequency of Social Gatherings with Friends and Family: More than three times a week    Attends Religious Services: More than 4 times per year    Active Member of [employer] or Organizations: Yes    Attends Engineer, structural: More than 4 times per year    Marital Status: Married  Catering manager Violence: Not At Risk (07/14/2022)   Humiliation, Afraid, Rape, and Kick questionnaire    Fear of Current or Ex-Partner: No    Emotionally Abused: No    Physically Abused: No    Sexually Abused: No    Past Medical History:  Diagnosis Date   Arthritis    back   Fatigue 11/10/2021   GERD (gastroesophageal reflux disease)    in past   Hypertriglyceridemia without hypercholesterolemia 09/03/2013    Mild cognitive impairment of uncertain or unknown etiology 08/16/2022   Neck pain on right side 12/25/2018   Osteopenia 11/16/2015   h/o bisphos use in records. PCP manages, dexa normal 2020, per pt as of 2022, osteopenia again, advised to f/u with PCP to discussed tx plan   Pain in left hand 05/18/2018   Pain in right elbow 10/15/2020   Pain in right hand  05/18/2018   Pain in right hip 04/30/2019   Pain in right shoulder 12/11/2018   Precordial chest pain 08/31/2009   Seasonal allergies    Unilateral primary osteoarthritis, right knee 08/24/2016   Viral gastroenteritis 07/13/2022   Past Surgical History:  Procedure Laterality Date   BACK SURGERY     Scheduled for 08/19/2021., Dr, Caralee Ates.   CERVICAL DISC SURGERY     over 10 years   COLONOSCOPY     Current Outpatient Medications on File Prior to Visit  Medication Sig Dispense Refill   acetaminophen (TYLENOL) 500 MG tablet Take 500 mg by mouth 2 (two) times daily.     alendronate (FOSAMAX) 70 MG tablet TAKE 1 TABLET BY MOUTH ONCE WEEKLY BEFORE THE FIRST FOOD, BEVERAGE OR MEDICINE OF THE DAY WITH PLAIN WATER 12 tablet 3   Calcium Carbonate-Vit D-Min (CALCIUM 1200 PO) Take 1 tablet by mouth in the morning and at bedtime.     cholecalciferol (VITAMIN D3) 25 MCG (1000 UNIT) tablet Take 125 mcg by mouth daily.     diclofenac (VOLTAREN) 75 MG EC tablet Take 1 tablet (75 mg total) by mouth 2 (two) times daily. 180 tablet 2   ferrous sulfate 325 (65 FE) MG tablet Take 325 mg by mouth daily with breakfast.     Fish Oil-Cholecalciferol (FISH OIL + D3) 1200-1000 MG-UNIT CAPS Take 1 tablet by mouth daily.     Glucosamine-Chondroit-Vit C-Mn (GLUCOSAMINE CHONDR 1500 COMPLX) CAPS Take 1 capsule by mouth 2 (two) times daily.     Lifitegrast (XIIDRA) 5 % SOLN Apply to eye.     Multiple Vitamins-Minerals (WOMENS MULTI VITAMIN & MINERAL PO) Take 1 tablet by mouth daily.     Red Yeast Rice 600 MG CAPS Take 600 mg by mouth daily.      Turmeric 500 MG TABS  Take 500 mg by mouth daily.      vitamin B-12 (CYANOCOBALAMIN) 100 MCG tablet Take 100 mcg by mouth daily.     vitamin E 100 UNIT capsule Take 100 Units by mouth daily.     Current Facility-Administered Medications on File Prior to Visit  Medication Dose Route Frequency Provider Last Rate Last Admin   0.9 %  sodium chloride infusion  500 mL Intravenous Continuous Pyrtle, Carie Caddy, MD       Allergies  Allergen Reactions   Pollen Extract Itching    sneezing   Social History   Socioeconomic History   Marital status: Married    Spouse name: Not on file   Number of children: 2   Years of education: 13   Highest education level: Associate degree: occupational, Scientist, product/process development, or vocational program  Occupational History   Occupation: Retired  Tobacco Use   Smoking status: Never   Smokeless tobacco: Never  Vaping Use   Vaping status: Never Used  Substance and Sexual Activity   Alcohol use: No   Drug use: No   Sexual activity: Not on file  Other Topics Concern   Not on file  Social History Narrative   Right handed   Drinks coffee   Lives with husband   2 story home   retired   International aid/development worker of Health   Financial Resource Strain: Low Risk  (07/14/2022)   Overall Financial Resource Strain (CARDIA)    Difficulty of Paying Living Expenses: Not hard at all  Food Insecurity: No Food Insecurity (07/14/2022)   Hunger Vital Sign    Worried About Running Out of Food in the Last Year:  Never true    Ran Out of Food in the Last Year: Never true  Transportation Needs: No Transportation Needs (07/14/2022)   PRAPARE - Administrator, Civil Service (Medical): No    Lack of Transportation (Non-Medical): No  Physical Activity: Sufficiently Active (07/14/2022)   Exercise Vital Sign    Days of Exercise per Week: 4 days    Minutes of Exercise per Session: 40 min  Stress: No Stress Concern Present (07/14/2022)   [employer] of Occupational Health - Occupational Stress  Questionnaire    Feeling of Stress : Not at all  Social Connections: Socially Integrated (07/14/2022)   Social Connection and Isolation Panel [NHANES]    Frequency of Communication with Friends and Family: Three times a week    Frequency of Social Gatherings with Friends and Family: More than three times a week    Attends Religious Services: More than 4 times per year    Active Member of [employer] or Organizations: Yes    Attends Banker Meetings: More than 4 times per year    Marital Status: Married  Catering manager Violence: Not At Risk (07/14/2022)   Humiliation, Afraid, Rape, and Kick questionnaire    Fear of Current or Ex-Partner: No    Emotionally Abused: No    Physically Abused: No    Sexually Abused: No      Review of Systems  Gastrointestinal:  Positive for diarrhea.  Neurological:  Positive for headaches.  All other systems reviewed and are negative.      Objective:   Physical Exam Vitals reviewed.  Constitutional:      General: She is not in acute distress.    Appearance: She is well-developed. She is not diaphoretic.  HENT:     Head: Normocephalic and atraumatic.      Right Ear: External ear normal.     Left Ear: External ear normal.     Nose: Nose normal.     Mouth/Throat:     Pharynx: No oropharyngeal exudate.  Eyes:     Conjunctiva/sclera: Conjunctivae normal.     Pupils: Pupils are equal, round, and reactive to light.  Neck:     Thyroid: No thyromegaly.     Vascular: No JVD.     Trachea: No tracheal deviation.  Cardiovascular:     Rate and Rhythm: Normal rate and regular rhythm.     Heart sounds: Normal heart sounds. No murmur heard.    No friction rub. No gallop.  Pulmonary:     Effort: Pulmonary effort is normal. No respiratory distress.     Breath sounds: Normal breath sounds. No wheezing or rales.  Chest:     Chest wall: No tenderness.  Abdominal:     General: Abdomen is flat.     Tenderness: There is no rebound.   Musculoskeletal:     Cervical back: Neck supple. Tenderness present. No swelling or bony tenderness. Decreased range of motion.  Lymphadenopathy:     Cervical: No cervical adenopathy.  Skin:    Findings: No rash.  Neurological:     Mental Status: She is alert and oriented to person, place, and time.     Cranial Nerves: No cranial nerve deficit, dysarthria or facial asymmetry.     Sensory: No sensory deficit.     Motor: No weakness or abnormal muscle tone.     Coordination: Coordination normal.     Gait: Gait normal.     Deep Tendon Reflexes: Reflexes normal.  Assessment & Plan:  Occipital headache Believe this headache is likely related to her underlying arthritis and dorsal spine.  I believe is a neuropathic headache related to this.  Begin prednisone taper pack for 6 days to see if the headache will improve.  If the headache does not improve, consider starting the patient on meloxicam for facet arthritis in the cervical spine.  I am reassured by the normal MRI in May of the brain.  Patient denies any fevers or chills or rashes.

## 2023-03-07 ENCOUNTER — Other Ambulatory Visit (HOSPITAL_COMMUNITY): Payer: Self-pay | Attending: Medical Genetics

## 2023-03-16 ENCOUNTER — Encounter: Payer: PPO | Admitting: Psychology

## 2023-03-16 ENCOUNTER — Encounter: Payer: Self-pay | Admitting: Family Medicine

## 2023-03-17 ENCOUNTER — Other Ambulatory Visit: Payer: Self-pay | Admitting: Family Medicine

## 2023-03-17 DIAGNOSIS — M5030 Other cervical disc degeneration, unspecified cervical region: Secondary | ICD-10-CM

## 2023-03-17 DIAGNOSIS — M542 Cervicalgia: Secondary | ICD-10-CM

## 2023-03-24 ENCOUNTER — Ambulatory Visit: Payer: PPO | Admitting: Physical Medicine and Rehabilitation

## 2023-03-24 ENCOUNTER — Encounter: Payer: Self-pay | Admitting: Physical Medicine and Rehabilitation

## 2023-03-24 VITALS — BP 127/68 | HR 80

## 2023-03-24 DIAGNOSIS — M47812 Spondylosis without myelopathy or radiculopathy, cervical region: Secondary | ICD-10-CM

## 2023-03-24 DIAGNOSIS — M5412 Radiculopathy, cervical region: Secondary | ICD-10-CM

## 2023-03-24 DIAGNOSIS — M961 Postlaminectomy syndrome, not elsewhere classified: Secondary | ICD-10-CM | POA: Diagnosis not present

## 2023-03-24 DIAGNOSIS — M5010 Cervical disc disorder with radiculopathy, unspecified cervical region: Secondary | ICD-10-CM

## 2023-03-24 NOTE — Progress Notes (Signed)
 Functional Pain Scale - descriptive words and definitions  Unmanageable (7)  Pain interferes with normal ADL's/nothing seems to help/sleep is very difficult/active distractions are very difficult to concentrate on. Severe range order  Average Pain 7  Referral for neck pain. Has seen Dr Georgina in the past. She would like to get esi. Pain in neck seem to be radiating down back more on the right side. A few weeks ago she saw her PCP for the pain radiating up into the head on the right side and took a course of prednisone  .

## 2023-03-24 NOTE — Progress Notes (Signed)
 Taylor Taylor - 78 y.o. female MRN [id number]  Date of birth: 29-Aug-1945  Office Visit Note: Visit Date: 03/24/2023 PCP: Duanne Butler DASEN, MD Referred by: Duanne Butler DASEN, MD  Subjective: Chief Complaint  Patient presents with   Neck - Pain   HPI: Taylor Taylor is a 78 y.o. female who comes in today for evaluation of chronic, worsening and severe bilateral neck pain, right greater than left. Pain does radiate down to right periscapular region. She is currently managed from cervical spine standpoint by Dr. Ozell Ada. Chronic neck pain for several years, her pain has worsened over the last several weeks. Her pain becomes severe movement and activity. She describes pain as sore and aching sensation, currently rates as 7 out of 10. Some relief of pain with home exercise regimen, rest and use of medications. History of formal physical therapy and cervical trigger point injections with some short term relief of pain. 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 Prior ACDF at C5-C6 with Dr. Colon. States she discussed possibility of cervical epidural steroid injection with Dr. Ada several months ago, would like to discuss treatment options today. Patient denies focal weakness, numbness and tingling. No recent trauma or falls.      Review of Systems  Musculoskeletal:  Positive for myalgias and neck pain.  Neurological:  Negative for sensory change, focal weakness and weakness.  All other systems reviewed and are negative.  Otherwise per HPI.  Assessment & Plan: Visit Diagnoses: No diagnosis found.   Plan: Findings:  Chronic, worsening and severe bilateral neck pain radiating to periscapular regions, right greater than left. Patient continues to have severe pain despite good conservative therapies such  as formal physical therapy, cervical myofascial trigger point injections, home exercise regimen, rest and use of medications. Patients clinical presentation and exam are consistent with cervical radiculopathy, there is adjacent level disease at C6-C7. I also feel there is a myofascial component contributing to her pain. Multiple palpable trigger points noted to bilateral trapezius regions upon exam today. Next step is to perform diagnostic and hopefully therapeutic right C7-T1 interlaminar epidural steroid injection under fluoroscopic guidance. She is not taking anticoagulant medication. If good relief of pain with injection we can repeat this procedure infrequently as needed. Patient has no questions regarding injection procedure. We will do our best to get her back in quickly. No red flag symptoms noted upon exam today.     Meds & Orders: No orders of the defined types were placed in this encounter.  No orders of the defined types were placed in this encounter.   Follow-up: No follow-ups on file.   Procedures: No procedures performed      Clinical History: MRI LUMBAR SPINE WITHOUT CONTRAST   TECHNIQUE: Multiplanar, multisequence MR imaging of the lumbar spine was performed. No intravenous contrast was administered.   COMPARISON:  11/07/2017   FINDINGS: Segmentation:  Standard.   Alignment:  Physiologic.   Vertebrae:  No fracture, evidence of discitis, or bone lesion.   Conus medullaris and cauda equina: Conus extends to the L1 level. Conus and cauda equina appear normal.   Paraspinal and other soft tissues: Negative.   Disc levels:   L1-L2: Normal disc space and facet joints. No spinal canal stenosis. No neural foraminal stenosis.   L2-L3: Progression of small disc bulge with facet hypertrophy. No  spinal canal stenosis. No neural foraminal stenosis.   L3-L4: Unchanged left asymmetric disc bulge and mild facet hypertrophy. Left lateral recess narrowing without central  spinal canal stenosis. No neural foraminal stenosis.   L4-L5: Unchanged right asymmetric disc bulge and superimposed right subarticular protrusion. Right lateral recess narrowing without central spinal canal stenosis. No neural foraminal stenosis.   L5-S1: Disc space narrowing with small bulge and endplate spurring. No spinal canal stenosis. Unchanged moderate bilateral neural foraminal stenosis.   Visualized sacrum: Normal.   IMPRESSION: 1. Unchanged moderate bilateral L5-S1 neural foraminal stenosis. 2. Unchanged right L4-5 lateral recess stenosis. 3. Unchanged left L3-4 lateral recess stenosis.     Electronically Signed   By: Franky Stanford M.D.   On: 05/19/2021 03:01   She reports that she has never smoked. She has never used smokeless tobacco. No results for input(s): HGBA1C, LABURIC in the last 8760 hours.  Objective:  VS:  HT:    WT:   BMI:     BP:127/68  HR:80bpm  TEMP: ( )  RESP:  Physical Exam Vitals and nursing note reviewed.  HENT:     Head: Normocephalic and atraumatic.     Right Ear: External ear normal.     Left Ear: External ear normal.     Nose: Nose normal.     Mouth/Throat:     Mouth: Mucous membranes are moist.  Eyes:     Extraocular Movements: Extraocular movements intact.  Cardiovascular:     Rate and Rhythm: Normal rate.     Pulses: Normal pulses.  Pulmonary:     Effort: Pulmonary effort is normal.  Abdominal:     General: Abdomen is flat. There is no distension.  Musculoskeletal:        General: Tenderness present.     Cervical back: Normal range of motion.     Comments: Discomfort noted with side to side rotation. Patient has good strength in the upper extremities including 5 out of 5 strength in wrist extension, long finger flexion and APB. Shoulder range of motion is full bilaterally without any sign of impingement. There is no atrophy of the hands intrinsically. Sensation intact bilaterally. Myofascial tenderness noted to bilateral  trapezius regions. Negative Hoffman's sign. Negative Spurling's sign.     Skin:    General: Skin is warm and dry.     Capillary Refill: Capillary refill takes less than 2 seconds.  Neurological:     General: No focal deficit present.     Mental Status: She is alert and oriented to person, place, and time.  Psychiatric:        Mood and Affect: Mood normal.        Behavior: Behavior normal.     Ortho Exam  Imaging: No results found.  Past Medical/Family/Surgical/Social History: Medications & Allergies reviewed per EMR, new medications updated. Patient Active Problem List   Diagnosis Date Noted   Arthritis 08/16/2022   Seasonal allergies 08/16/2022   Mild cognitive impairment of uncertain or unknown etiology 08/16/2022   Viral gastroenteritis 07/13/2022   Fatigue 11/10/2021   Unilateral primary osteoarthritis, right knee 08/24/2016   Osteopenia 11/16/2015   Hypertriglyceridemia without hypercholesterolemia 09/03/2013   Past Medical History:  Diagnosis Date   Arthritis    back   Fatigue 11/10/2021   GERD (gastroesophageal reflux disease)    in past   Hypertriglyceridemia without hypercholesterolemia 09/03/2013   Mild cognitive impairment of uncertain or unknown etiology 08/16/2022   Neck pain on right side 12/25/2018  Osteopenia 11/16/2015   h/o bisphos use in records. PCP manages, dexa normal 2020, per pt as of 2022, osteopenia again, advised to f/u with PCP to discussed tx plan   Pain in left hand 05/18/2018   Pain in right elbow 10/15/2020   Pain in right hand 05/18/2018   Pain in right hip 04/30/2019   Pain in right shoulder 12/11/2018   Precordial chest pain 08/31/2009   Seasonal allergies    Unilateral primary osteoarthritis, right knee 08/24/2016   Viral gastroenteritis 07/13/2022   Family History  Problem Relation Age of Onset   Parkinson's disease Mother    Brain cancer Father    Breast cancer Neg Hx    Past Surgical History:  Procedure Laterality  Date   BACK SURGERY     Scheduled for 08/19/2021., Dr, Rondal.   CERVICAL DISC SURGERY     over 10 years   COLONOSCOPY     Social History   Occupational History   Occupation: Retired  Tobacco Use   Smoking status: Never   Smokeless tobacco: Never  Vaping Use   Vaping status: Never Used  Substance and Sexual Activity   Alcohol use: No   Drug use: No   Sexual activity: Not on file

## 2023-03-27 ENCOUNTER — Other Ambulatory Visit: Payer: Self-pay | Admitting: Family Medicine

## 2023-03-27 ENCOUNTER — Ambulatory Visit: Payer: Self-pay | Admitting: Family Medicine

## 2023-03-27 MED ORDER — PREDNISONE 20 MG PO TABS: ORAL_TABLET | ORAL | 0 refills | Status: DC

## 2023-03-27 NOTE — Telephone Encounter (Signed)
 Copied from CRM [id number]. Topic: Clinical - Red Word Triage >> Mar 27, 2023  2:07 PM Elle L wrote: Red Word that prompted transfer to Nurse Triage: The patient is having severe pain in her neck and is unable to get her prescription at this time.    Chief Complaint: Neck pain Symptoms: neck pain radiating into head Frequency: intermittent Pertinent Negatives: Patient denies new injury Disposition: [] ED /[] Urgent Care (no appt availability in office) / [] Appointment(In office/virtual)/ []  [place]/ [] Home Care/ [] Refused Recommended Disposition /[] [place] [facility]/ [x]  Follow-up with PCP Additional Notes: Patient reports neck pain x several days, states that she was seen on 03/17/23 and prescribed prednisone  which helps. States the is scheduled with another doctor to start getting steroid injections but that wont be for several weeks. Pt requesting refill, RN confirmed  that refill request was submiitted but it may take up to 3 days. Pt expressed her frustration with the wait time, RN offered to schedule a visit with provider to possible address pain in office, pt declined visit at this time.   Reason for Disposition  [1] MODERATE neck pain (e.g., interferes with normal activities) AND [2] present > 3 days  Answer Assessment - Initial Assessment Questions 1. ONSET: When did the pain begin?      Started before christmas a weeel before.   2. LOCATION: Where does it hurt?      Back of neck   3. PATTERN Does the pain come and go, or has it been constant since it started?     Intermittent  4. SEVERITY: How bad is the pain?  (Scale 1-10; or mild, moderate, severe)   - NO PAIN (0): no pain or only slight stiffness    - MILD (1-3): doesn't interfere with normal activities    - MODERATE (4-7): interferes with normal activities or awakens from sleep    - SEVERE (8-10):  excruciating pain, unable to do any normal activities      8/10 pain  5. RADIATION: Does the pain  go anywhere else, shoot into your arms?     Down between shoulder blades and up into head  6. CORD SYMPTOMS: Any weakness or numbness of the arms or legs?     No weakness of numbness  7. CAUSE: What do you think is causing the neck pain?     Was told she has arthritis in neck  8. NECK OVERUSE: Any recent activities that involved turning or twisting the neck?     No over use  9. OTHER SYMPTOMS: Do you have any other symptoms? (e.g., headache, fever, chest pain, difficulty breathing, neck swelling)     No other symptoms  Protocols used: Neck Pain or Stiffness-A-AH

## 2023-03-28 ENCOUNTER — Telehealth: Payer: Self-pay | Admitting: Physical Medicine and Rehabilitation

## 2023-03-28 NOTE — Telephone Encounter (Signed)
 Pt called requesting an appt for neck injection with Dr Alvester Morin. Pt phone number is [phone].

## 2023-03-28 NOTE — Telephone Encounter (Signed)
 Requested medication (s) are due for refill today -no  Requested medication (s) are on the active medication list -yes  Future visit scheduled -no  Last refill: 03/27/23 #12  Notes to clinic: duplicate request- non delegated Rx  Requested Prescriptions  Pending Prescriptions Disp Refills   predniSONE  (DELTASONE ) 20 MG tablet [Pharmacy Med Name: PREDNISONE  20 MG TABLET] 12 tablet 0    Sig: 3 TABS DAYS 1-2, 2 TABS DAYS 3-4, 1 TAB DAYS 5-6     Not Delegated - Endocrinology:  Oral Corticosteroids Failed - 03/28/2023  9:41 AM      Failed - This refill cannot be delegated      Failed - Manual Review: Eye exam for IOP if prolonged treatment      Failed - Valid encounter within last 6 months    Recent Outpatient Visits           1 year ago Right lower quadrant abdominal pain   [facility] Duanne, Butler DASEN, MD   1 year ago Viral gastroenteritis   [facility] Duanne, Butler DASEN, MD   1 year ago Viral gastroenteritis   [facility] Duanne, Butler DASEN, MD   2 years ago Osteopenia, unspecified location   [facility] Pickard, Butler DASEN, MD   3 years ago Chronic fatigue   [facility] Pickard, Butler DASEN, MD              Failed - Bone Mineral Density or Dexa Scan completed in the last 2 years      Passed - Glucose (serum) in normal range and within 180 days    Glucose, Bld  Date Value Ref Range Status  12/13/2022 83 65 - 99 mg/dL Final    Comment:    .            Fasting reference interval .          Passed - K in normal range and within 180 days    Potassium  Date Value Ref Range Status  12/13/2022 4.6 3.5 - 5.3 mmol/L Final         Passed - Na in normal range and within 180 days    Sodium  Date Value Ref Range Status  12/13/2022 139 135 - 146 mmol/L Final         Passed - Last BP in normal range    BP Readings from Last 1 Encounters:  03/24/23 127/68            Requested  Prescriptions  Pending Prescriptions Disp Refills   predniSONE  (DELTASONE ) 20 MG tablet [Pharmacy Med Name: PREDNISONE  20 MG TABLET] 12 tablet 0    Sig: 3 TABS DAYS 1-2, 2 TABS DAYS 3-4, 1 TAB DAYS 5-6     Not Delegated - Endocrinology:  Oral Corticosteroids Failed - 03/28/2023  9:41 AM      Failed - This refill cannot be delegated      Failed - Manual Review: Eye exam for IOP if prolonged treatment      Failed - Valid encounter within last 6 months    Recent Outpatient Visits           1 year ago Right lower quadrant abdominal pain   [facility] Duanne Butler DASEN, MD   1 year ago Viral gastroenteritis   [facility] Pickard, Butler DASEN, MD   1 year ago Viral gastroenteritis   [facility]  Duanne Butler DASEN, MD   2 years ago Osteopenia, unspecified location   [facility] Pickard, Butler DASEN, MD   3 years ago Chronic fatigue   [facility] Pickard, Butler DASEN, MD              Failed - Bone Mineral Density or Dexa Scan completed in the last 2 years      Passed - Glucose (serum) in normal range and within 180 days    Glucose, Bld  Date Value Ref Range Status  12/13/2022 83 65 - 99 mg/dL Final    Comment:    .            Fasting reference interval .          Passed - K in normal range and within 180 days    Potassium  Date Value Ref Range Status  12/13/2022 4.6 3.5 - 5.3 mmol/L Final         Passed - Na in normal range and within 180 days    Sodium  Date Value Ref Range Status  12/13/2022 139 135 - 146 mmol/L Final         Passed - Last BP in normal range    BP Readings from Last 1 Encounters:  03/24/23 127/68

## 2023-03-30 ENCOUNTER — Other Ambulatory Visit: Payer: Self-pay

## 2023-03-30 ENCOUNTER — Ambulatory Visit: Payer: PPO | Admitting: Physical Medicine and Rehabilitation

## 2023-03-30 DIAGNOSIS — M5412 Radiculopathy, cervical region: Secondary | ICD-10-CM | POA: Diagnosis not present

## 2023-03-30 MED ORDER — METHYLPREDNISOLONE ACETATE 40 MG/ML IJ SUSP
40.0000 mg | Freq: Once | INTRAMUSCULAR | Status: AC
Start: 2023-03-30 — End: 2023-03-30
  Administered 2023-03-30: 40 mg

## 2023-03-30 NOTE — Progress Notes (Signed)
 Taylor Taylor - 78 y.o. female MRN [id number]  Date of birth: 1945/04/10  Office Visit Note: Visit Date: 03/30/2023 PCP: Duanne Butler DASEN, MD Referred by: Duanne Butler DASEN, MD  Subjective: Chief Complaint  Patient presents with   Neck - Pain   HPI:  Taylor Taylor is a 78 y.o. female who comes in today at the request of Duwaine Pouch, FNP for planned Right C7-T1 Cervical Interlaminar epidural steroid injection with fluoroscopic guidance.  The patient has failed conservative care including home exercise, medications, time and activity modification.  This injection will be diagnostic and hopefully therapeutic.  Please see requesting physician notes for further details and justification.   ROS Otherwise per HPI.  Assessment & Plan: Visit Diagnoses:    ICD-10-CM   1. Radiculopathy, cervical region  M54.12 XR C-ARM NO REPORT    Epidural Steroid injection    methylPREDNISolone  acetate (DEPO-MEDROL ) injection 40 mg      Plan: No additional findings.   Meds & Orders:  Meds ordered this encounter  Medications   methylPREDNISolone  acetate (DEPO-MEDROL ) injection 40 mg    Orders Placed This Encounter  Procedures   XR C-ARM NO REPORT   Epidural Steroid injection    Follow-up: Return for visit to requesting provider as needed.   Procedures: No procedures performed  Cervical Epidural Steroid Injection - Interlaminar Approach with Fluoroscopic Guidance  [name]      Date of Birth: 09/08/1945 MRN: [id number] PCP: Duanne Butler DASEN, MD      Visit Date: 03/30/2023   Universal Protocol:    Date/Time: 03/29/2508:39 AM  Consent Given By: the patient  Position: PRONE  Additional Comments: Vital signs were monitored before and after the procedure. Patient was prepped and draped in the usual sterile fashion. The correct patient, procedure, and site was verified.   Injection Procedure Details:   Procedure diagnoses: Radiculopathy, cervical  region [M54.12]    Meds Administered:  Meds ordered this encounter  Medications   methylPREDNISolone  acetate (DEPO-MEDROL ) injection 40 mg     Laterality: Right  Location/Site: C7-T1  Needle: 3.5 in., 20 ga. Tuohy  Needle Placement: Paramedian epidural space  Findings:  -Comments: Excellent flow of contrast into the epidural space.  Procedure Details: Using a paramedian approach from the side mentioned above, the region overlying the inferior lamina was localized under fluoroscopic visualization and the soft tissues overlying this structure were infiltrated with 4 ml. of 1% Lidocaine  without Epinephrine. A # 20 gauge, Tuohy needle was inserted into the epidural space using a paramedian approach.  The epidural space was localized using loss of resistance along with contralateral oblique bi-planar fluoroscopic views.  After negative aspirate for air, blood, and CSF, a 2 ml. volume of Isovue -250 was injected into the epidural space and the flow of contrast was observed. Radiographs were obtained for documentation purposes.   The injectate was administered into the level noted above.  Additional Comments:  The patient tolerated the procedure well Dressing: 2 x 2 sterile gauze and Band-Aid    Post-procedure details: Patient was observed during the procedure. Post-procedure instructions were reviewed.  Patient left the clinic in stable condition.   Clinical History: MRI LUMBAR SPINE WITHOUT CONTRAST   TECHNIQUE: Multiplanar, multisequence MR imaging of the lumbar spine was performed. No intravenous contrast was administered.   COMPARISON:  11/07/2017   FINDINGS: Segmentation:  Standard.   Alignment:  Physiologic.   Vertebrae:  No fracture, evidence of discitis, or bone lesion.   Conus  medullaris and cauda equina: Conus extends to the L1 level. Conus and cauda equina appear normal.   Paraspinal and other soft tissues: Negative.   Disc levels:   L1-L2: Normal disc  space and facet joints. No spinal canal stenosis. No neural foraminal stenosis.   L2-L3: Progression of small disc bulge with facet hypertrophy. No spinal canal stenosis. No neural foraminal stenosis.   L3-L4: Unchanged left asymmetric disc bulge and mild facet hypertrophy. Left lateral recess narrowing without central spinal canal stenosis. No neural foraminal stenosis.   L4-L5: Unchanged right asymmetric disc bulge and superimposed right subarticular protrusion. Right lateral recess narrowing without central spinal canal stenosis. No neural foraminal stenosis.   L5-S1: Disc space narrowing with small bulge and endplate spurring. No spinal canal stenosis. Unchanged moderate bilateral neural foraminal stenosis.   Visualized sacrum: Normal.   IMPRESSION: 1. Unchanged moderate bilateral L5-S1 neural foraminal stenosis. 2. Unchanged right L4-5 lateral recess stenosis. 3. Unchanged left L3-4 lateral recess stenosis.     Electronically Signed   By: Franky Stanford M.D.   On: 05/19/2021 03:01     Objective:  VS:  HT:    WT:   BMI:     BP:   HR: bpm  TEMP: ( )  RESP:  Physical Exam Vitals and nursing note reviewed.  Constitutional:      General: She is not in acute distress.    Appearance: Normal appearance. She is not ill-appearing.  HENT:     Head: Normocephalic and atraumatic.     Right Ear: External ear normal.     Left Ear: External ear normal.  Eyes:     Extraocular Movements: Extraocular movements intact.  Cardiovascular:     Rate and Rhythm: Normal rate.     Pulses: Normal pulses.  Musculoskeletal:     Cervical back: Tenderness present. No rigidity.     Right lower leg: No edema.     Left lower leg: No edema.     Comments: Patient has good strength in the upper extremities including 5 out of 5 strength in wrist extension long finger flexion and APB.  There is no atrophy of the hands intrinsically.  There is a negative Hoffmann's test.   Lymphadenopathy:      Cervical: No cervical adenopathy.  Skin:    Findings: No erythema, lesion or rash.  Neurological:     General: No focal deficit present.     Mental Status: She is alert and oriented to person, place, and time.     Sensory: No sensory deficit.     Motor: No weakness or abnormal muscle tone.     Coordination: Coordination normal.  Psychiatric:        Mood and Affect: Mood normal.        Behavior: Behavior normal.      Imaging: No results found.

## 2023-03-30 NOTE — Procedures (Signed)
 Cervical Epidural Steroid Injection - Interlaminar Approach with Fluoroscopic Guidance  [name]      Date of Birth: [birth year] MRN: [id number] PCP: Duanne Butler DASEN, MD      Visit Date: 03/30/2023   Universal Protocol:    Date/Time: 03/29/2508:39 AM  Consent Given By: the patient  Position: PRONE  Additional Comments: Vital signs were monitored before and after the procedure. Patient was prepped and draped in the usual sterile fashion. The correct patient, procedure, and site was verified.   Injection Procedure Details:   Procedure diagnoses: Radiculopathy, cervical region [M54.12]    Meds Administered:  Meds ordered this encounter  Medications   methylPREDNISolone  acetate (DEPO-MEDROL ) injection 40 mg     Laterality: Right  Location/Site: C7-T1  Needle: 3.5 in., 20 ga. Tuohy  Needle Placement: Paramedian epidural space  Findings:  -Comments: Excellent flow of contrast into the epidural space.  Procedure Details: Using a paramedian approach from the side mentioned above, the region overlying the inferior lamina was localized under fluoroscopic visualization and the soft tissues overlying this structure were infiltrated with 4 ml. of 1% Lidocaine  without Epinephrine. A # 20 gauge, Tuohy needle was inserted into the epidural space using a paramedian approach.  The epidural space was localized using loss of resistance along with contralateral oblique bi-planar fluoroscopic views.  After negative aspirate for air, blood, and CSF, a 2 ml. volume of Isovue -250 was injected into the epidural space and the flow of contrast was observed. Radiographs were obtained for documentation purposes.   The injectate was administered into the level noted above.  Additional Comments:  The patient tolerated the procedure well Dressing: 2 x 2 sterile gauze and Band-Aid    Post-procedure details: Patient was observed during the procedure. Post-procedure  instructions were reviewed.  Patient left the clinic in stable condition.

## 2023-03-30 NOTE — Progress Notes (Signed)
 Functional Pain Scale - descriptive words and definitions  Uncomfortable (3)  Pain is present but can complete all ADL's/sleep is slightly affected and passive distraction only gives marginal relief. Mild range order  Average Pain 3   +Driver, -BT, -Dye Allergies.

## 2023-04-10 ENCOUNTER — Encounter: Payer: Self-pay | Admitting: Physical Medicine and Rehabilitation

## 2023-04-10 ENCOUNTER — Ambulatory Visit: Payer: PPO | Admitting: Physical Medicine and Rehabilitation

## 2023-04-10 DIAGNOSIS — M47812 Spondylosis without myelopathy or radiculopathy, cervical region: Secondary | ICD-10-CM | POA: Diagnosis not present

## 2023-04-10 DIAGNOSIS — M7918 Myalgia, other site: Secondary | ICD-10-CM | POA: Diagnosis not present

## 2023-04-10 DIAGNOSIS — M542 Cervicalgia: Secondary | ICD-10-CM | POA: Diagnosis not present

## 2023-04-10 DIAGNOSIS — M961 Postlaminectomy syndrome, not elsewhere classified: Secondary | ICD-10-CM | POA: Diagnosis not present

## 2023-04-10 NOTE — Progress Notes (Signed)
Taylor Taylor - 78 y.o. female MRN [id number]  Date of birth: 1946-03-03  Office Visit Note: Visit Date: 04/10/2023 PCP: [name], MD Referred by: [name], MD  Subjective: Chief Complaint  Patient presents with   Neck - Pain   HPI: Taylor Taylor is a 78 y.o. female who comes in today for evaluation of chronic, worsening and severe bilateral neck pain radiating up to her head, right greater than left. She recently underwent right C7-T1 interlaminar epidural steroid injection in our office on 03/30/2023. She reports complete resolution of symptoms for about 4 days post injection. Her symptoms have gradually returned over the last week. Pain radiating to right periscapular region has improved since injection, bilateral neck pain remains and does radiate up to her head. Her pain worsens with movement and activity, severe pain with side to side movement. She describes pain as sore and throbbing sensation, currently rates as 6 out of 10.  History of formal physical therapy and cervical trigger point injections with some short term relief of pain. Recent cervical MRI imaging shows prior ACDF at C5-C6, adjacent segment disease at C4-C5 and C6-C7. Right paracentral disc protrusion at C6-C7. There is moderate bilateral facet arthropathy at C3-C4 and C6-C7. No high grade spinal canal stenosis noted. Prior ACDF at C5-C6 with Dr. Danielle Dess. Patient denies focal weakness, numbness and tingling. No recent trauma or falls.      Review of Systems  Musculoskeletal:  Positive for myalgias and neck pain.  Neurological:  Negative for tingling, sensory change, focal weakness and weakness.  All other systems reviewed and are negative.  Otherwise per HPI.  Assessment & Plan: Visit Diagnoses:    ICD-10-CM   1. Cervicalgia  M54.2     2. Facet arthropathy, cervical  M47.812     3. Post laminectomy syndrome  M96.1     4. Myofascial pain syndrome  M79.18        Plan: Findings:   Chronic, worsening and severe bilateral neck pain radiating up to her head, right greater than left. Majority of her pain seems to be localized to bilateral occipital regions, right greater than left. She continues to have severe pain despite good conservative therapies such as formal physical therapy, home exercise regimen, rest and use of medications. Patients clinical presentation and exam are complex, differentials include fact mediated pain vs myofascial pain syndrome. She does have severe pain with side to side rotation. There are also multiple palpable trigger points noted to bilateral levator scapulae regions. We discussed treatment plan in detail today, her pain is likely a referral pain from C3-C4 facet joints. Next step is to perform diagnostic and hopefully therapeutic bilateral C3-C4 facet joint injections under fluoroscopic guidance. I discussed injection procedure in detail today, she has no questions at this time. If good relief of pain we can repeat this procedure infrequently as needed. Should her pain persist would consider re-grouping with formal physical therapy for dry needling treatments. She has no questions at this time. Should her pain present as more radicular in nature would consider repeating cervical epidural steroid injection. No red flag symptoms noted upon exam today.     Meds & Orders: No orders of the defined types were placed in this encounter.  No orders of the defined types were placed in this encounter.   Follow-up: Return for Bilateral C3-C4 facet joint injections.   Procedures: No procedures performed      Clinical History: MRI LUMBAR SPINE WITHOUT CONTRAST  TECHNIQUE: Multiplanar, multisequence MR imaging of the lumbar spine was performed. No intravenous contrast was administered.   COMPARISON:  11/07/2017   FINDINGS: Segmentation:  Standard.   Alignment:  Physiologic.   Vertebrae:  No fracture, evidence of discitis, or bone lesion.   Conus  medullaris and cauda equina: Conus extends to the L1 level. Conus and cauda equina appear normal.   Paraspinal and other soft tissues: Negative.   Disc levels:   L1-L2: Normal disc space and facet joints. No spinal canal stenosis. No neural foraminal stenosis.   L2-L3: Progression of small disc bulge with facet hypertrophy. No spinal canal stenosis. No neural foraminal stenosis.   L3-L4: Unchanged left asymmetric disc bulge and mild facet hypertrophy. Left lateral recess narrowing without central spinal canal stenosis. No neural foraminal stenosis.   L4-L5: Unchanged right asymmetric disc bulge and superimposed right subarticular protrusion. Right lateral recess narrowing without central spinal canal stenosis. No neural foraminal stenosis.   L5-S1: Disc space narrowing with small bulge and endplate spurring. No spinal canal stenosis. Unchanged moderate bilateral neural foraminal stenosis.   Visualized sacrum: Normal.   IMPRESSION: 1. Unchanged moderate bilateral L5-S1 neural foraminal stenosis. 2. Unchanged right L4-5 lateral recess stenosis. 3. Unchanged left L3-4 lateral recess stenosis.     Electronically Signed   By: Deatra Robinson M.D.   On: 05/19/2021 03:01   She reports that she has never smoked. She has never used smokeless tobacco. No results for input(s): "HGBA1C", "LABURIC" in the last 8760 hours.  Objective:  VS:  HT:    WT:   BMI:     BP:   HR: bpm  TEMP: ( )  RESP:  Physical Exam HENT:     Head: Normocephalic and atraumatic.     Right Ear: External ear normal.     Left Ear: External ear normal.     Nose: Nose normal.     Mouth/Throat:     Mouth: Mucous membranes are moist.  Eyes:     Extraocular Movements: Extraocular movements intact.  Cardiovascular:     Rate and Rhythm: Normal rate.     Pulses: Normal pulses.  Pulmonary:     Effort: Pulmonary effort is normal.  Abdominal:     General: Abdomen is flat. There is no distension.   Musculoskeletal:        General: Tenderness present.     Cervical back: Normal range of motion.     Comments: Discomfort noted with side to side rotation. Patient has good strength in the upper extremities including 5 out of 5 strength in wrist extension, long finger flexion and APB. Shoulder range of motion is full bilaterally without any sign of impingement. There is no atrophy of the hands intrinsically. Sensation intact bilaterally. Multiple palpable trigger points noted to bilateral levator scapulae regions. Negative Hoffman's sign. Negative Spurling's sign.     Skin:    General: Skin is warm and dry.     Capillary Refill: Capillary refill takes less than 2 seconds.  Neurological:     General: No focal deficit present.     Mental Status: She is alert and oriented to person, place, and time.  Psychiatric:        Mood and Affect: Mood normal.        Behavior: Behavior normal.     Ortho Exam  Imaging: No results found.  Past Medical/Family/Surgical/Social History: Medications & Allergies reviewed per EMR, new medications updated. Patient Active Problem List   Diagnosis Date Noted  Arthritis 08/16/2022   Seasonal allergies 08/16/2022   Mild cognitive impairment of uncertain or unknown etiology 08/16/2022   Viral gastroenteritis 07/13/2022   Fatigue 11/10/2021   Unilateral primary osteoarthritis, right knee 08/24/2016   Osteopenia 11/16/2015   Hypertriglyceridemia without hypercholesterolemia 09/03/2013   Past Medical History:  Diagnosis Date   Arthritis    back   Fatigue 11/10/2021   GERD (gastroesophageal reflux disease)    in past   Hypertriglyceridemia without hypercholesterolemia 09/03/2013   Mild cognitive impairment of uncertain or unknown etiology 08/16/2022   Neck pain on right side 12/25/2018   Osteopenia 11/16/2015   h/o bisphos use in records. PCP manages, dexa normal 2020, per pt as of 2022, osteopenia again, advised to f/u with PCP to discussed tx plan    Pain in left hand 05/18/2018   Pain in right elbow 10/15/2020   Pain in right hand 05/18/2018   Pain in right hip 04/30/2019   Pain in right shoulder 12/11/2018   Precordial chest pain 08/31/2009   Seasonal allergies    Unilateral primary osteoarthritis, right knee 08/24/2016   Viral gastroenteritis 07/13/2022   Family History  Problem Relation Age of Onset   Parkinson's disease Mother    Brain cancer Father    Breast cancer Neg Hx    Past Surgical History:  Procedure Laterality Date   BACK SURGERY     Scheduled for 08/19/2021., Dr, Caralee Ates.   CERVICAL DISC SURGERY     over 10 years   COLONOSCOPY     Social History   Occupational History   Occupation: Retired  Tobacco Use   Smoking status: Never   Smokeless tobacco: Never  Vaping Use   Vaping status: Never Used  Substance and Sexual Activity   Alcohol use: No   Drug use: No   Sexual activity: Not on file

## 2023-04-11 ENCOUNTER — Encounter: Payer: PPO | Admitting: Physical Medicine and Rehabilitation

## 2023-04-26 ENCOUNTER — Ambulatory Visit: Payer: PPO | Admitting: Physical Medicine and Rehabilitation

## 2023-04-26 ENCOUNTER — Other Ambulatory Visit: Payer: Self-pay

## 2023-04-26 DIAGNOSIS — M47812 Spondylosis without myelopathy or radiculopathy, cervical region: Secondary | ICD-10-CM | POA: Diagnosis not present

## 2023-04-26 MED ORDER — METHYLPREDNISOLONE ACETATE 40 MG/ML IJ SUSP
40.0000 mg | Freq: Once | INTRAMUSCULAR | Status: AC
  Administered 2023-04-26: 40 mg

## 2023-04-26 NOTE — Progress Notes (Signed)
 Functional Pain Scale - descriptive words and definitions  Mild (2)   Noticeable when not distracted/no impact on ADL's/sleep only slightly affected and able to   use both passive and active distraction for comfort. Mild range order  Average Pain 8  R > L she stated both sides are hurting today.  +Driver, -BT, -Dye Allergies.

## 2023-05-03 ENCOUNTER — Ambulatory Visit: Payer: Self-pay

## 2023-05-03 ENCOUNTER — Institutional Professional Consult (permissible substitution): Payer: PPO | Admitting: Psychology

## 2023-05-04 ENCOUNTER — Other Ambulatory Visit: Payer: Self-pay | Admitting: Physical Medicine and Rehabilitation

## 2023-05-04 DIAGNOSIS — M5416 Radiculopathy, lumbar region: Secondary | ICD-10-CM

## 2023-05-04 NOTE — Progress Notes (Signed)
 Taylor Taylor - 78 y.o. female MRN [id number]  Date of birth: Sep 30, 1945  Office Visit Note: Visit Date: 04/26/2023 PCP: Duanne Butler DASEN, MD Referred by: Duanne Butler DASEN, MD  Subjective: Chief Complaint  Patient presents with   Neck - Pain   HPI:  Taylor Taylor is a 78 y.o. female who comes in today at the request of Duwaine Pouch, FNP for planned Bilateral  C3-4 Cervical facet/medial branch block with fluoroscopic guidance.  The patient has failed conservative care including home exercise, medications, time and activity modification.  This injection will be diagnostic and hopefully therapeutic.  Please see requesting physician notes for further details and justification.  Exam has shown concordant pain with facet joint loading.   ROS Otherwise per HPI.  Assessment & Plan: Visit Diagnoses:    ICD-10-CM   1. Facet arthropathy, cervical  M47.812 XR C-ARM NO REPORT    Facet Injection    methylPREDNISolone  acetate (DEPO-MEDROL ) injection 40 mg      Plan: No additional findings.   Meds & Orders:  Meds ordered this encounter  Medications   methylPREDNISolone  acetate (DEPO-MEDROL ) injection 40 mg    Orders Placed This Encounter  Procedures   Facet Injection   XR C-ARM NO REPORT    Follow-up: Return if symptoms worsen or fail to improve.   Procedures: No procedures performed  Diagnostic Cervical Facet Joint Nerve Block with Fluoroscopic Guidance  [name]      Date of Birth: [birth year] MRN: [id number] PCP: Duanne Butler DASEN, MD      Visit Date: 04/26/2023   Universal Protocol:    Date/Time: 02/13/258:12 AM  Consent Given By: the patient  Position: PRONE  Additional Comments: Vital signs were monitored before and after the procedure. Patient was prepped and draped in the usual sterile fashion. The correct patient, procedure, and site was verified.   Injection Procedure Details:   Procedure diagnoses: Facet arthropathy,  cervical [M47.812]   Meds Administered:  Meds ordered this encounter  Medications   methylPREDNISolone  acetate (DEPO-MEDROL ) injection 40 mg     Laterality: Bilateral  Location/Site:  C3-4  Needle size: 25 G  Needle type: Spinal  Needle Placement: Articular Pillar  Findings:  -Contrast Used: 0.5 mL iohexol  180 mg iodine/mL   -Comments: Excellent flow of contrast across the articular pillars without intravascular flow  Procedure Details: The fluoroscope beam was positioned to square off the endplates of the desired vertebral level to achieve a true AP position. The beam was then moved in a small "counter" oblique to the contralateral side with a small amount of caudal tilt to achieve a trajectory alignment with the desired nerves.  For each target described below the skin was anesthetized with 1 ml of 1% Lidocaine  without epinephrine.   To block the facet joint nerve to C2, the needle was fluoroscopically positioned over the inferior lateral portion of the C2/3 facet joint nerve where the third occipital nerve (TON) lies.  To block the facet joint nerves from C3 through C7, the lateral masses of these respective levels were localized under fluoroscopic visualization.  A spinal needle was inserted down to the waist at the above mentioned cervical levels.  The  needle was then walked off until it rested just lateral to the trough of the lateral mass of the medial branch nerve, which innervates the cervical facet joint.  To block the C8 facet joint nerve, the needle was fluoroscopically introduced onto the Tl transverse process at its most  medial superior end.  After contact with periosteum and negative aspirate for blood and CSF, correct placement without intravascular or epidural spread was confirmed by Bi-planar images and  injecting 0.5 ml. of Omnipaque -240.  A spot radiograph was obtained of this image.  Next, a 0.5 ml. volume of 1% Lidocaine  without Epinephrine was then  injected.  Prior to the procedure, the patient was given a Pain Diary which was completed for baseline measurements.  After the procedure, the patient rated their pain every 30 minutes and will continue rating at this frequency for a total of 5 hours.  The patient has been asked to complete the Diary and return to us  by mail, fax or hand delivered as soon as possible.   Additional Comments:  The patient tolerated the procedure well Dressing: Band-Aid    Post-procedure details: Patient was observed during the procedure. Post-procedure instructions were reviewed.  Patient left the clinic in stable condition.       Clinical History: MRI CERVICAL SPINE WITHOUT CONTRAST   TECHNIQUE: Multiplanar, multisequence MR imaging of the cervical spine was performed. No intravenous contrast was administered.   COMPARISON:  X-ray 08/29/2022, MRI 02/26/2009   FINDINGS: Alignment: Trace retrolisthesis at C4-5.   Vertebrae: C5-6 ACDF with evidence of solid arthrodesis. Discogenic endplate marrow changes at C4-5. No fracture. No evidence of discitis. No marrow replacing bone lesion.   Cord: Normal signal and morphology.   Posterior Fossa, vertebral arteries, paraspinal tissues: Negative.   Disc levels:   C2-C3: No disc protrusion. Mild bilateral facet arthropathy. No foraminal or canal stenosis.   C3-C4: No disc protrusion. Moderate bilateral facet arthropathy. Mild uncovertebral spurring. Mild right foraminal stenosis, progressed. No canal stenosis.   C4-C5: Disc osteophyte complex with mild facet and uncovertebral arthropathy. Mild bilateral foraminal stenosis. Borderline-mild canal stenosis. Findings have progressed from prior.   C5-C6: Interval ACDF. No residual foraminal or canal stenosis. Improved.   C6-C7: Right paracentral disc protrusion. Moderate-severe right and mild left facet arthropathy. Mild uncovertebral spurring. Mild bilateral foraminal stenosis. Mild canal  stenosis. Findings have progressed from prior.   C7-T1: No disc protrusion. Mild bilateral facet arthropathy. No foraminal or canal stenosis.   IMPRESSION: 1. Interval C5-6 ACDF with evidence of solid arthrodesis. No residual foraminal or canal stenosis at this level. 2. Adjacent segment disease at C4-5 and C6-7 with mild bilateral foraminal stenosis and mild canal stenosis.     Electronically Signed   By: Mabel Converse D.O.   On: 09/01/2022 11:27     Objective:  VS:  HT:    WT:   BMI:     BP:   HR: bpm  TEMP: ( )  RESP:  Physical Exam Vitals and nursing note reviewed.  Constitutional:      General: She is not in acute distress.    Appearance: Normal appearance. She is not ill-appearing.  HENT:     Head: Normocephalic and atraumatic.     Right Ear: External ear normal.     Left Ear: External ear normal.  Eyes:     Extraocular Movements: Extraocular movements intact.  Cardiovascular:     Rate and Rhythm: Normal rate.     Pulses: Normal pulses.  Musculoskeletal:     Cervical back: Tenderness present. No rigidity.     Right lower leg: No edema.     Left lower leg: No edema.     Comments: Patient has good strength in the upper extremities including 5 out of 5 strength in wrist extension  long finger flexion and APB.  There is no atrophy of the hands intrinsically.  There is a negative Hoffmann's test.   Lymphadenopathy:     Cervical: No cervical adenopathy.  Skin:    Findings: No erythema, lesion or rash.  Neurological:     General: No focal deficit present.     Mental Status: She is alert and oriented to person, place, and time.     Sensory: No sensory deficit.     Motor: No weakness or abnormal muscle tone.     Coordination: Coordination normal.  Psychiatric:        Mood and Affect: Mood normal.        Behavior: Behavior normal.      Imaging: No results found.

## 2023-05-04 NOTE — Procedures (Signed)
 Diagnostic Cervical Facet Joint Nerve Block with Fluoroscopic Guidance  [name]      Date of Birth: [birth year] MRN: [id number] PCP: Duanne Butler DASEN, MD      Visit Date: 04/26/2023   Universal Protocol:    Date/Time: 02/13/258:12 AM  Consent Given By: the patient  Position: PRONE  Additional Comments: Vital signs were monitored before and after the procedure. Patient was prepped and draped in the usual sterile fashion. The correct patient, procedure, and site was verified.   Injection Procedure Details:   Procedure diagnoses: Facet arthropathy, cervical [M47.812]   Meds Administered:  Meds ordered this encounter  Medications   methylPREDNISolone  acetate (DEPO-MEDROL ) injection 40 mg     Laterality: Bilateral  Location/Site:  C3-4  Needle size: 25 G  Needle type: Spinal  Needle Placement: Articular Pillar  Findings:  -Contrast Used: 0.5 mL iohexol  180 mg iodine/mL   -Comments: Excellent flow of contrast across the articular pillars without intravascular flow  Procedure Details: The fluoroscope beam was positioned to square off the endplates of the desired vertebral level to achieve a true AP position. The beam was then moved in a small "counter" oblique to the contralateral side with a small amount of caudal tilt to achieve a trajectory alignment with the desired nerves.  For each target described below the skin was anesthetized with 1 ml of 1% Lidocaine  without epinephrine.   To block the facet joint nerve to C2, the needle was fluoroscopically positioned over the inferior lateral portion of the C2/3 facet joint nerve where the third occipital nerve (TON) lies.  To block the facet joint nerves from C3 through C7, the lateral masses of these respective levels were localized under fluoroscopic visualization.  A spinal needle was inserted down to the waist at the above mentioned cervical levels.  The  needle was then walked off until it rested  just lateral to the trough of the lateral mass of the medial branch nerve, which innervates the cervical facet joint.  To block the C8 facet joint nerve, the needle was fluoroscopically introduced onto the Tl transverse process at its most medial superior end.  After contact with periosteum and negative aspirate for blood and CSF, correct placement without intravascular or epidural spread was confirmed by Bi-planar images and  injecting 0.5 ml. of Omnipaque -240.  A spot radiograph was obtained of this image.  Next, a 0.5 ml. volume of 1% Lidocaine  without Epinephrine was then injected.  Prior to the procedure, the patient was given a Pain Diary which was completed for baseline measurements.  After the procedure, the patient rated their pain every 30 minutes and will continue rating at this frequency for a total of 5 hours.  The patient has been asked to complete the Diary and return to us  by mail, fax or hand delivered as soon as possible.   Additional Comments:  The patient tolerated the procedure well Dressing: Band-Aid    Post-procedure details: Patient was observed during the procedure. Post-procedure instructions were reviewed.  Patient left the clinic in stable condition.

## 2023-05-07 IMAGING — MG MM DIGITAL SCREENING BILAT W/ TOMO AND CAD
8 series · 8 of 24 positions shown · non-contrast
Comparison: Previous exam(s).

CLINICAL DATA: Screening.

EXAM:
DIGITAL SCREENING BILATERAL MAMMOGRAM WITH TOMOSYNTHESIS AND CAD
TECHNIQUE: Bilateral screening digital craniocaudal and mediolateral oblique
mammograms were obtained. Bilateral screening digital breast
tomosynthesis was performed. The images were evaluated with
computer-aided detection.

[L MLO synth-2D]
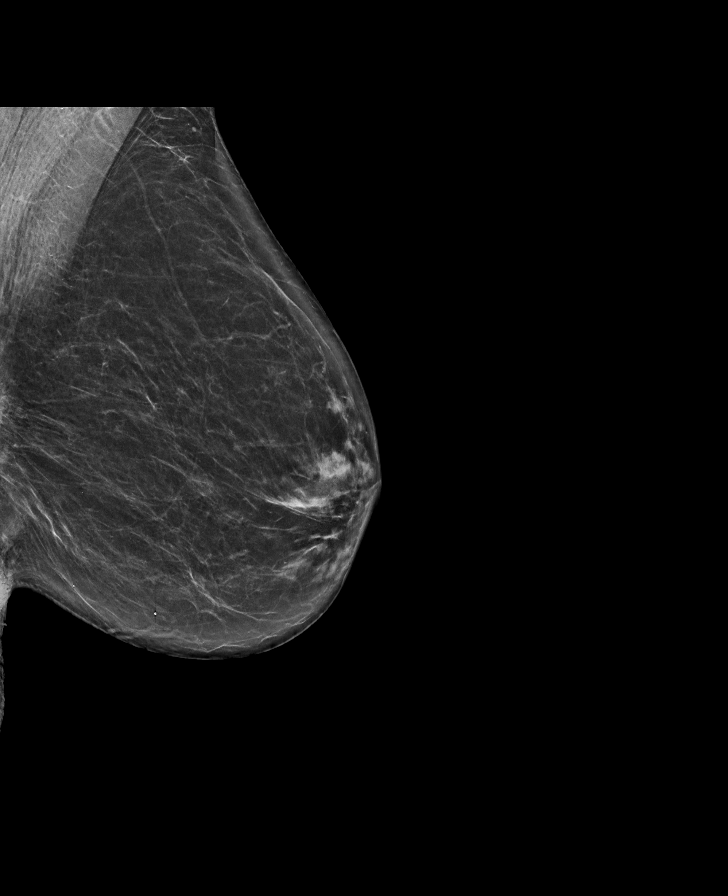

[L CC synth-2D]
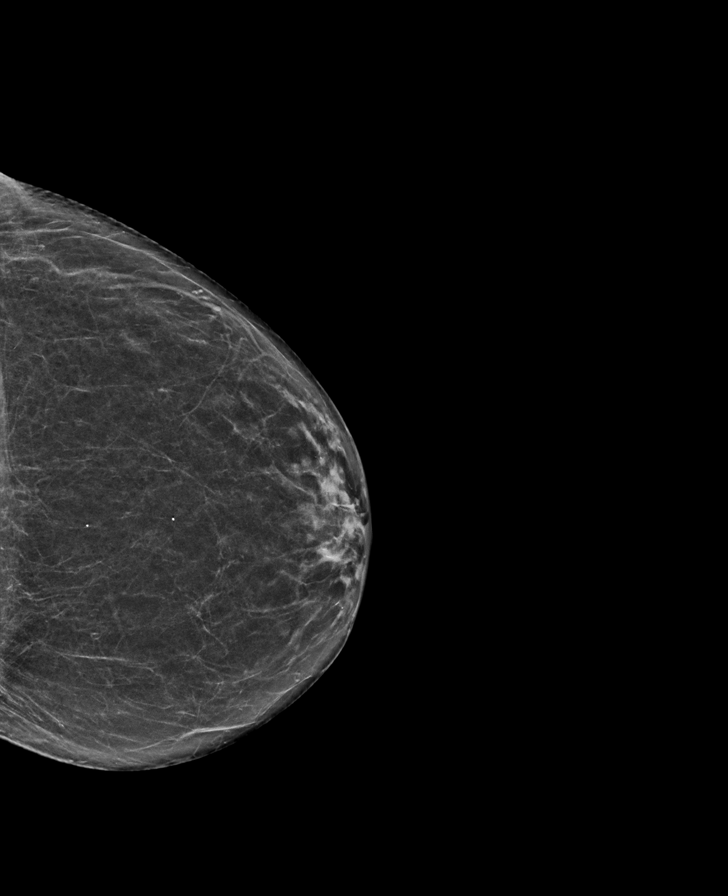

[R CC synth-2D]
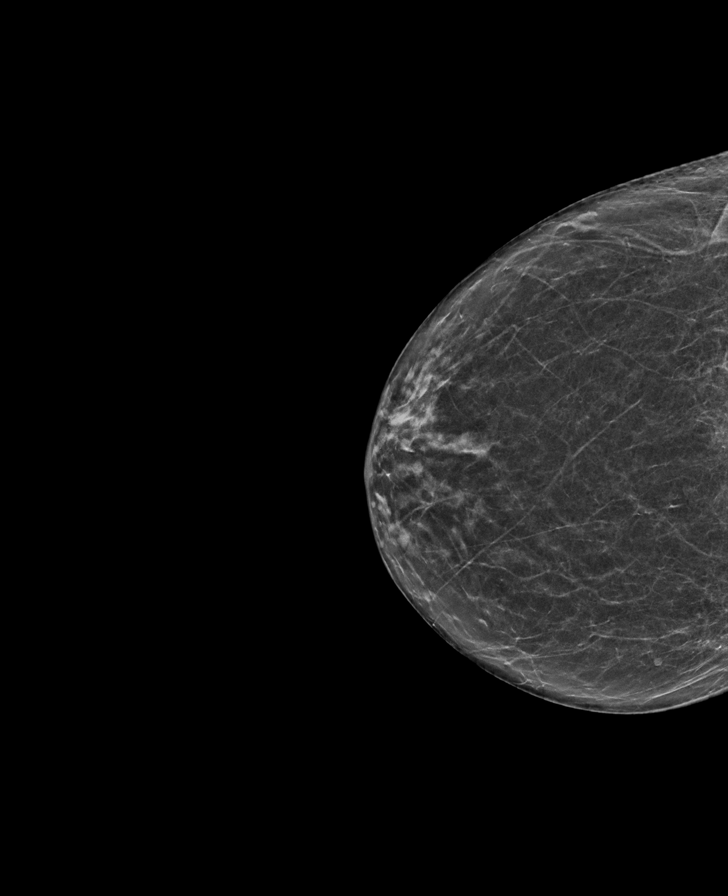

[R MLO synth-2D]
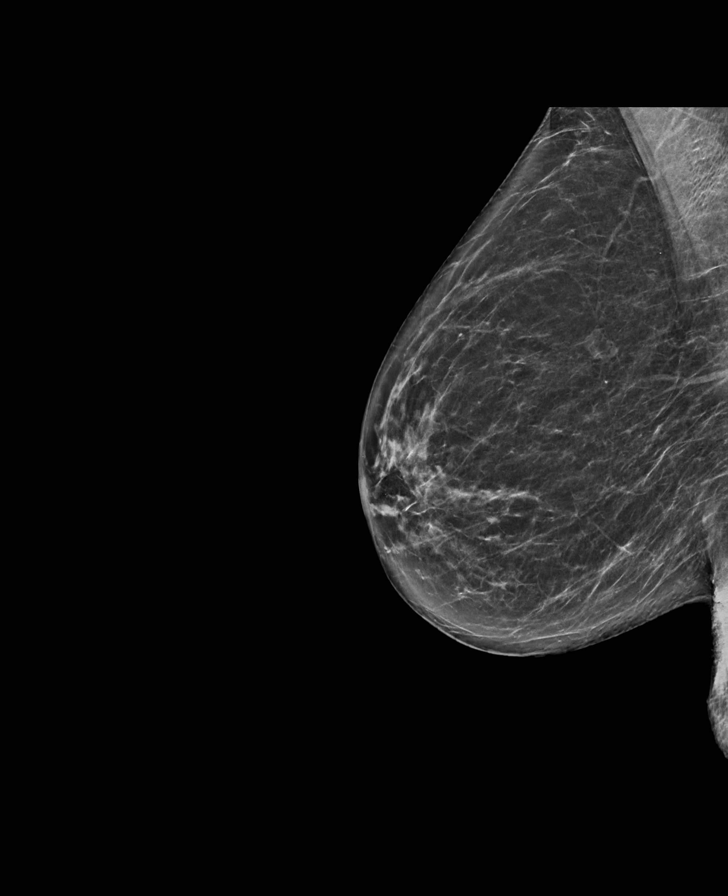

[L CC tomo · tomo slice 31/60.0]
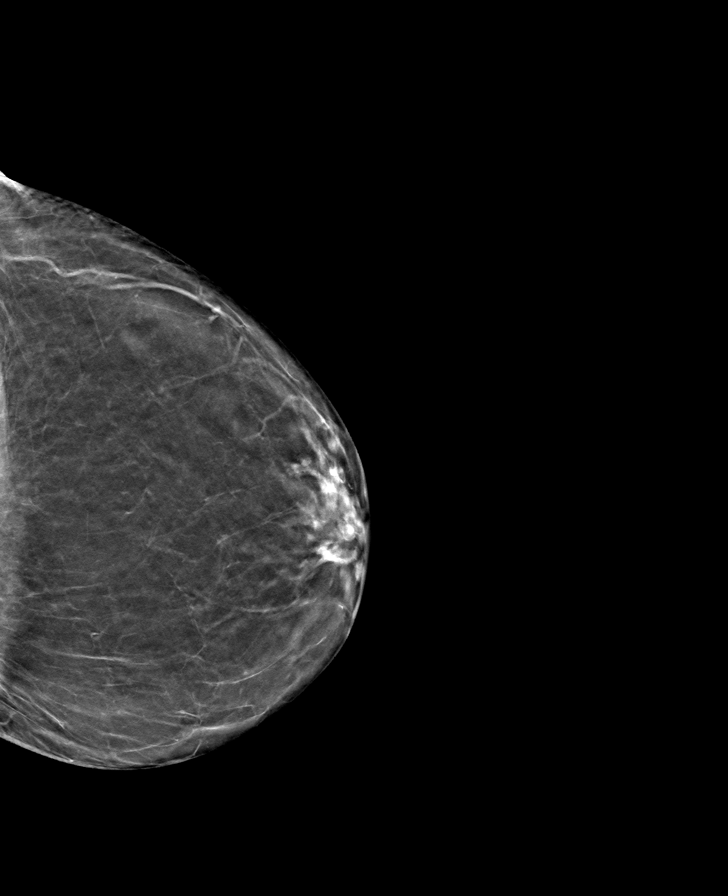

[L MLO tomo · tomo slice 37/72.0]
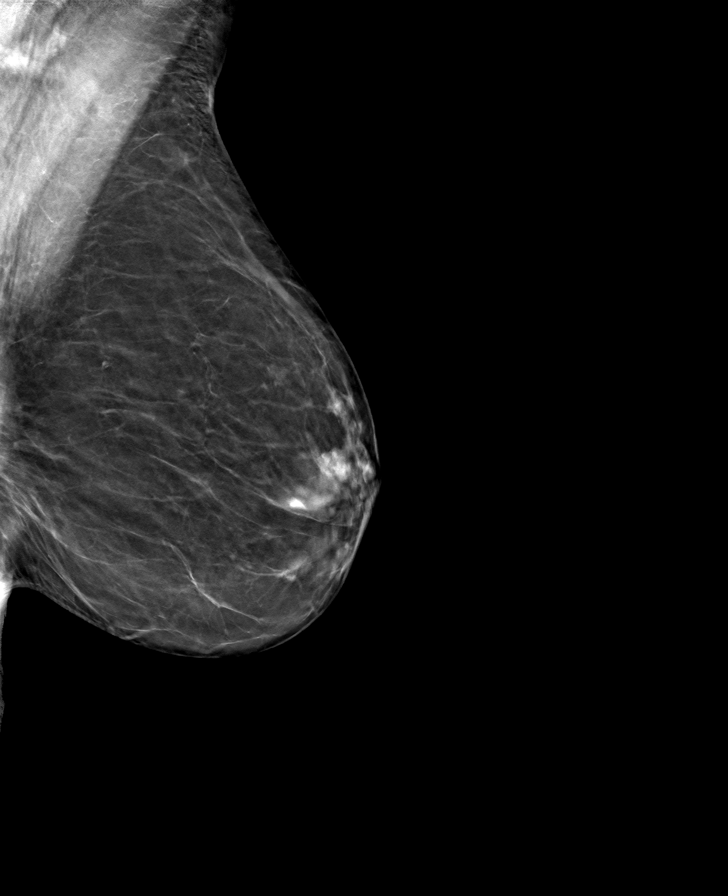

[R CC tomo · tomo slice 30/59.0]
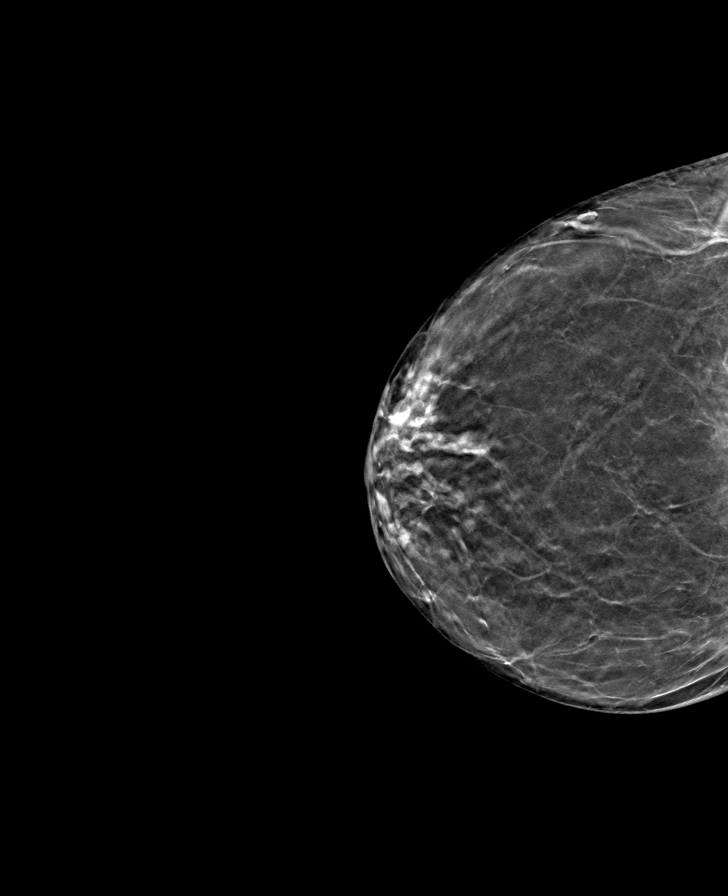

[R MLO tomo · tomo slice 34/67.0]
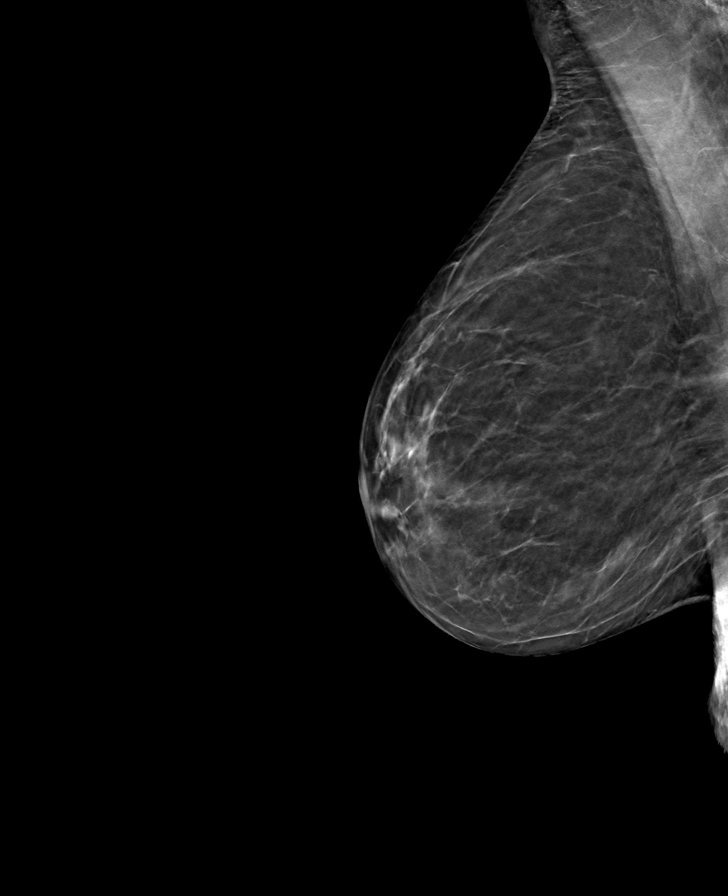

[8 of 24 positions shown; findings below may reference images not displayed]

ACR Breast Density Category b: There are scattered areas of
fibroglandular density.
FINDINGS: There are no findings suspicious for malignancy.
IMPRESSION: No mammographic evidence of malignancy. A result letter of this
screening mammogram will be mailed directly to the patient.

RECOMMENDATION:
Screening mammogram in one year. (Code:51-O-LD2)

BI-RADS CATEGORY  1: Negative.

## 2023-05-08 ENCOUNTER — Telehealth: Payer: Self-pay | Admitting: Physical Medicine and Rehabilitation

## 2023-05-08 NOTE — Telephone Encounter (Signed)
 Patient would like to be on a cancellation list for today or tomorrow to see Dr Alvester Morin sooner.

## 2023-05-10 ENCOUNTER — Encounter: Payer: PPO | Admitting: Psychology

## 2023-05-11 ENCOUNTER — Other Ambulatory Visit: Payer: Self-pay

## 2023-05-11 ENCOUNTER — Encounter: Payer: Self-pay | Admitting: Physical Medicine and Rehabilitation

## 2023-05-11 ENCOUNTER — Ambulatory Visit: Payer: PPO | Admitting: Physical Medicine and Rehabilitation

## 2023-05-11 VITALS — BP 120/74 | HR 91

## 2023-05-11 DIAGNOSIS — M961 Postlaminectomy syndrome, not elsewhere classified: Secondary | ICD-10-CM

## 2023-05-11 DIAGNOSIS — G8929 Other chronic pain: Secondary | ICD-10-CM | POA: Diagnosis not present

## 2023-05-11 DIAGNOSIS — M47812 Spondylosis without myelopathy or radiculopathy, cervical region: Secondary | ICD-10-CM

## 2023-05-11 DIAGNOSIS — M5416 Radiculopathy, lumbar region: Secondary | ICD-10-CM | POA: Diagnosis not present

## 2023-05-11 DIAGNOSIS — M542 Cervicalgia: Secondary | ICD-10-CM | POA: Diagnosis not present

## 2023-05-11 DIAGNOSIS — M5442 Lumbago with sciatica, left side: Secondary | ICD-10-CM

## 2023-05-11 MED ORDER — METHYLPREDNISOLONE ACETATE 40 MG/ML IJ SUSP
40.0000 mg | Freq: Once | INTRAMUSCULAR | Status: AC
  Administered 2023-05-11: 40 mg

## 2023-05-11 NOTE — Patient Instructions (Signed)

## 2023-05-11 NOTE — Progress Notes (Signed)
 Taylor Taylor - 78 y.o. female MRN [id number]  Date of birth: 06-14-1945  Office Visit Note: Visit Date: 05/11/2023 PCP: [name], MD Referred by: [name], MD  Subjective: Chief Complaint  Patient presents with   Neck - Pain   HPI:  Taylor Taylor is a 78 y.o. female who comes in today at the request of Ellin Goodie, FNP for planned Left S1 Lumbar Transforaminal epidural steroid injection with fluoroscopic guidance.  Briefly her history is that she had prior right L5-S1 hemilaminectomy by Dr. Barnett Abu back in the summer 2023.  There is an MRI prior to that surgery but no MRI postsurgery.  We had completed a right L5 transforaminal injection postsurgery at his request and that did seem to help her for quite some time.  Most recently we have seen her for her cervical spine through Dr. Willia Craze.  Please review in the HPI below.  In terms of her lumbar spine she called in with a several month history of left-sided low back and buttock pain radiating down the posterior lateral leg to the calf and foot.  Pretty classic S1 distribution with some paresthesia but no focal weakness.  No new trauma etc.  She reports insidious onset of just progressive worsening despite medications and time.  She continues with home exercise program through prior injections and surgery.  This been a real chronic issue for her for her back this is the first time it has been more left-sided than right-sided.  She reports to me that really in the past that had always been somewhat both sides but the right had been worse and now the left is worse.  She does have disc height loss at L5-S1 pretty significant with foraminal narrowing bilaterally.  She did not ever have any great deal of central stenosis.   She reports continued neck pain although the last injection did help quite a bit.  This was C3-4 facet joint blocks.  She is asking about another injection at some point.  We have done  epidural injections and now facet joint blocks with some relief but not great relief.  I told her to follow-up with Dr. Christell Constant.  Depending on how she is doing we could look at repeat C3-4 facet medial branch block and radiofrequency ablation for more longer-term relief it just would not cause it to probably be any better in terms of the total pain relief.   Review of Systems  Musculoskeletal:  Positive for back pain, joint pain and neck pain.  Neurological:  Positive for tingling.  All other systems reviewed and are negative.  Otherwise per HPI.  Assessment & Plan: Visit Diagnoses:    ICD-10-CM   1. Lumbar radiculopathy  M54.16 XR C-ARM NO REPORT    Epidural Steroid injection    methylPREDNISolone acetate (DEPO-MEDROL) injection 40 mg    2. Post laminectomy syndrome  M96.1 XR C-ARM NO REPORT    Epidural Steroid injection    methylPREDNISolone acetate (DEPO-MEDROL) injection 40 mg    3. Chronic bilateral low back pain with left-sided sciatica  M54.42    G89.29     4. Cervicalgia  M54.2     5. Cervical spondylosis without myelopathy  M47.812       Plan: Findings:  1.  Chronic history of low back pain with chronic history of right more than left radiculopathy status post lumbar laminectomy decompression L5-S1 for lateral recess narrowing and foraminal narrowing.  No history of high-grade central  stenosis.  Now new onset several month history of progressive left-sided S1 radiculopathy consistent on exam and history.  Does not have an MRI but no real red flag symptoms or signs.  Will go ahead today Lajoyce Corners the significant pain and complete a left S1 transforaminal injection.  This would be diagnostic and hopefully therapeutic.  If this does not seem to help much or does not help very long I would look at new MRI of the lumbar spine with contrast.  She can always see Dr. Danielle Dess as well.  She will continue current medications and home exercise plan.  2.  In terms of her neck pain she did get  significant relief with the C3-4 facet joint blocks but not with really the epidural injection.  We could look at second diagnostic block at some point with radiofrequency ablation.  I told her to go ahead and follow-up with Dr. Willia Craze right now and see how that goes and I will review the plan for her cervical spine.    Meds & Orders:  Meds ordered this encounter  Medications   methylPREDNISolone acetate (DEPO-MEDROL) injection 40 mg    Orders Placed This Encounter  Procedures   XR C-ARM NO REPORT   Epidural Steroid injection    Follow-up: Return if symptoms worsen or fail to improve.   Procedures: No procedures performed  S1 Lumbosacral Transforaminal Epidural Steroid Injection - Sub-Pedicular Approach with Fluoroscopic Guidance   [name]      Date of Birth: 1945-09-09 MRN: [id number] PCP: [name], MD      Visit Date: 05/11/2023   Universal Protocol:    Date/Time: 05/10/2508:54 AM  Consent Given By: the patient  Position:  PRONE  Additional Comments: Vital signs were monitored before and after the procedure. Patient was prepped and draped in the usual sterile fashion. The correct patient, procedure, and site was verified.   Injection Procedure Details:  Procedure Site One Meds Administered:  Meds ordered this encounter  Medications   methylPREDNISolone acetate (DEPO-MEDROL) injection 40 mg    Laterality: Left  Location/Site:  S1 Foramen   Needle size: 22 ga.  Needle type: Spinal  Needle Placement: Transforaminal  Findings:   -Comments: Excellent flow of contrast along the nerve, nerve root and into the epidural space.  Epidurogram: Contrast epidurogram showed no nerve root cut off or restricted flow pattern.  Procedure Details: After squaring off the sacral end-plate to get a true AP view, the C-arm was positioned so that the best possible view of the S1 foramen was visualized. The soft tissues overlying this  structure were infiltrated with 2-3 ml. of 1% Lidocaine without Epinephrine.    The spinal needle was inserted toward the target using a "trajectory" view along the fluoroscope beam.  Under AP and lateral visualization, the needle was advanced so it did not puncture dura. Biplanar projections were used to confirm position. Aspiration was confirmed to be negative for CSF and/or blood. A 1-2 ml. volume of Isovue-250 was injected and flow of contrast was noted at each level. Radiographs were obtained for documentation purposes.   After attaining the desired flow of contrast documented above, a 0.5 to 1.0 ml test dose of 0.25% Marcaine was injected into each respective transforaminal space.  The patient was observed for 90 seconds post injection.  After no sensory deficits were reported, and normal lower extremity motor function was noted,   the above injectate was administered so that equal amounts of the injectate were  placed at each foramen (level) into the transforaminal epidural space.   Additional Comments:  The patient tolerated the procedure well Dressing: Band-Aid with 2 x 2 sterile gauze    Post-procedure details: Patient was observed during the procedure. Post-procedure instructions were reviewed.  Patient left the clinic in stable condition.   Clinical History: MRI LUMBAR SPINE WITHOUT CONTRAST  TECHNIQUE: Multiplanar, multisequence MR imaging of the lumbar spine was performed. No intravenous contrast was administered.  COMPARISON: 11/07/2017  FINDINGS: Segmentation: Standard.  Alignment: Physiologic.  Vertebrae: No fracture, evidence of discitis, or bone lesion.  Conus medullaris and cauda equina: Conus extends to the L1 level. Conus and cauda equina appear normal.  Paraspinal and other soft tissues: Negative.  Disc levels:  L1-L2: Normal disc space and facet joints. No spinal canal stenosis. No neural foraminal stenosis.  L2-L3: Progression of small disc bulge  with facet hypertrophy. No spinal canal stenosis. No neural foraminal stenosis.  L3-L4: Unchanged left asymmetric disc bulge and mild facet hypertrophy. Left lateral recess narrowing without central spinal canal stenosis. No neural foraminal stenosis.  L4-L5: Unchanged right asymmetric disc bulge and superimposed right subarticular protrusion. Right lateral recess narrowing without central spinal canal stenosis. No neural foraminal stenosis.  L5-S1: Disc space narrowing with small bulge and endplate spurring. No spinal canal stenosis. Unchanged moderate bilateral neural foraminal stenosis.  Visualized sacrum: Normal.  IMPRESSION: 1. Unchanged moderate bilateral L5-S1 neural foraminal stenosis. 2. Unchanged right L4-5 lateral recess stenosis. 3. Unchanged left L3-4 lateral recess stenosis.   Electronically Signed By: Deatra Robinson M.D. On: 05/19/2021 03:01     Objective:  VS:  HT:    WT:   BMI:     BP:120/74  HR:91bpm  TEMP: ( )  RESP:  Physical Exam Vitals and nursing note reviewed.  Constitutional:      General: She is not in acute distress.    Appearance: Normal appearance. She is not ill-appearing.  HENT:     Head: Normocephalic and atraumatic.     Right Ear: External ear normal.     Left Ear: External ear normal.  Eyes:     Extraocular Movements: Extraocular movements intact.  Cardiovascular:     Rate and Rhythm: Normal rate.     Pulses: Normal pulses.  Pulmonary:     Effort: Pulmonary effort is normal. No respiratory distress.  Abdominal:     General: There is no distension.     Palpations: Abdomen is soft.  Musculoskeletal:        General: Tenderness present.     Cervical back: Neck supple.     Right lower leg: No edema.     Left lower leg: No edema.     Comments: Patient has good distal strength with no pain over the greater trochanters.  No clonus or focal weakness.  She has dysesthesia in the left S1 dermatome.  Equivocal slump test on the left.   She has decent range of motion with cervical spine with pain on rotation and extension and facet loading.  Skin:    Findings: No erythema, lesion or rash.  Neurological:     General: No focal deficit present.     Mental Status: She is alert and oriented to person, place, and time.     Cranial Nerves: No cranial nerve deficit.     Sensory: No sensory deficit.     Motor: No weakness or abnormal muscle tone.     Coordination: Coordination normal.     Gait: Gait normal.  Psychiatric:        Mood and Affect: Mood normal.        Behavior: Behavior normal.      Imaging: No results found.

## 2023-05-11 NOTE — Procedures (Signed)
 S1 Lumbosacral Transforaminal Epidural Steroid Injection - Sub-Pedicular Approach with Fluoroscopic Guidance   [name]      Date of Birth: 1945/11/25 MRN: [id number] PCP: [name], MD      Visit Date: 05/11/2023   Universal Protocol:    Date/Time: 05/10/2508:54 AM  Consent Given By: the patient  Position:  PRONE  Additional Comments: Vital signs were monitored before and after the procedure. Patient was prepped and draped in the usual sterile fashion. The correct patient, procedure, and site was verified.   Injection Procedure Details:  Procedure Site One Meds Administered:  Meds ordered this encounter  Medications   methylPREDNISolone acetate (DEPO-MEDROL) injection 40 mg    Laterality: Left  Location/Site:  S1 Foramen   Needle size: 22 ga.  Needle type: Spinal  Needle Placement: Transforaminal  Findings:   -Comments: Excellent flow of contrast along the nerve, nerve root and into the epidural space.  Epidurogram: Contrast epidurogram showed no nerve root cut off or restricted flow pattern.  Procedure Details: After squaring off the sacral end-plate to get a true AP view, the C-arm was positioned so that the best possible view of the S1 foramen was visualized. The soft tissues overlying this structure were infiltrated with 2-3 ml. of 1% Lidocaine without Epinephrine.    The spinal needle was inserted toward the target using a "trajectory" view along the fluoroscope beam.  Under AP and lateral visualization, the needle was advanced so it did not puncture dura. Biplanar projections were used to confirm position. Aspiration was confirmed to be negative for CSF and/or blood. A 1-2 ml. volume of Isovue-250 was injected and flow of contrast was noted at each level. Radiographs were obtained for documentation purposes.   After attaining the desired flow of contrast documented above, a 0.5 to 1.0 ml test dose of 0.25% Marcaine was injected into  each respective transforaminal space.  The patient was observed for 90 seconds post injection.  After no sensory deficits were reported, and normal lower extremity motor function was noted,   the above injectate was administered so that equal amounts of the injectate were placed at each foramen (level) into the transforaminal epidural space.   Additional Comments:  The patient tolerated the procedure well Dressing: Band-Aid with 2 x 2 sterile gauze    Post-procedure details: Patient was observed during the procedure. Post-procedure instructions were reviewed.  Patient left the clinic in stable condition.

## 2023-05-11 NOTE — Progress Notes (Signed)
 Pain scale---5, patient advising she has an increase of pain in neck at night. No thinners No allergies to contrast dyd

## 2023-05-18 ENCOUNTER — Ambulatory Visit: Payer: PPO | Admitting: Orthopedic Surgery

## 2023-05-18 DIAGNOSIS — M5416 Radiculopathy, lumbar region: Secondary | ICD-10-CM

## 2023-05-18 DIAGNOSIS — M5412 Radiculopathy, cervical region: Secondary | ICD-10-CM | POA: Diagnosis not present

## 2023-05-18 MED ORDER — PREGABALIN 75 MG PO CAPS: 75.0000 mg | ORAL_CAPSULE | Freq: Two times a day (BID) | ORAL | 1 refills | Status: DC

## 2023-05-18 NOTE — Progress Notes (Signed)
 Orthopedic Spine Surgery Office Note   Assessment: Patient is a 78 y.o. female with history of C5/6 ACDF.  Has adjacent segment degeneration and foraminal stenosis at C6/7 that may be causing her right scapular pain. Also, reporting left leg pain along the lateral aspect, suspect L5 radiculopathy     Plan: -Patient has gotten good relief with injections, but they only last about 2 weeks.  Accordingly, do not feel that this would be a good long-term solution for her pain -Prescribed Lyrica to see if that would help with her pain.  Can continue with diclofenac and Tylenol -Patient should return to office on an as needed basis     Patient expressed understanding of the plan and all questions were answered to the patient's satisfaction.    ___________________________________________________________________________     History:   Patient is a 78 y.o. female who presents today for routine follow-up on her cervical spine and to talk about her lumbar spine.  Patient has a history of a C5/6 ACDF with Dr. Danielle Dess.  She is still reporting significant neck pain and right shoulder pain in the area of the scapula.  She feels the pain is worse in the mornings.  She does not notice any relief with rest.  Pain does not radiate further than the scapula.  She is having no pain into the left upper extremity.  She has recently gotten injections with Dr. Alvester Morin and found that helpful but they only lasted for couple weeks and then pain returned.  In regards to her lumbar spine, she has noticed now a couple months of left leg pain that she feels at night.  She feels it along the lateral aspect of her thigh and leg to the level of the ankle.  She said it resolves during the daytime.  She sometimes has difficulty sleeping as a result of the pain.  No pain radiating into the right lower extremity.  No weakness noted in the legs.  No bowel or bladder incontinence.  No saddle anesthesia.     Treatments tried: Cervical  injection, lumbar injection, Tylenol, diclofenac     Physical Exam:   General: no acute distress, appears stated age Neurologic: alert, answering questions appropriately, following commands Respiratory: unlabored breathing on room air, symmetric chest rise Psychiatric: appropriate affect, normal cadence to speech     MSK (spine):   -Strength exam                                                   Left                  Right Grip strength                5/5                  5/5 Interosseus                  5/5                  5/5 Wrist extension            5/5                  5/5 Wrist flexion                 5/5  5/5 Elbow flexion                5/5                  5/5 Deltoid                          5/5                  5/5   EHL                              5/5                  5/5 TA                                 5/5                  5/5 GSC                             5/5                  5/5 Knee extension            5/5                  5/5 Hip flexion                    5/5                  5/5   -Sensory exam                           Sensation intact to light touch in C4-T1 nerve distributions of bilateral upper extremities  Sensation intact to light touch in L2-S1 nerve distributions of bilateral lower extremities   -Brachioradialis DTR: 2/4 on the left, 2/4 on the right -Biceps DTR: 2/4 on the left, 2/4 on the right   -Spurling: negative bilaterally Left shoulder exam: no pain through range of motion Right shoulder exam: no pain through range of motion, TTP just medial to the scapula near the superior angle, no other tenderness to palpation, negative jobe, negative belly press, no weakness with external rotation with arm at side     Imaging: XRs of the cervical spine from 08/29/2022 were previously independently reviewed and interpreted, showing disc height loss at C4/5 and C6/7.  ACDF with anterior instrumentation at C5/6 with no evidence of  complication.  No fracture or dislocation seen.  No evidence of instability on flexion/extension views.   MRI of the cervical spine from 08/30/2022 was previously independently reviewed and interpreted, showing prior ACDF at C5/6 with no foraminal or central stenosis at that level.  DDD at C4/5 and C6/7.  There is a disc bulge at C6/7.  Bilateral foraminal stenosis at C6/7.  No significant central stenosis.  No T2 cord signal change.  MRI of the lumbar spine from 05/18/2021 was independently reviewed and interpreted, showing bilateral foraminal stenosis at L5/S1. Lateral recess stenosis on the right at L4/5.     Patient name: Taylor Taylor Patient MRN: [id number] Date of visit: 05/18/23

## 2023-05-20 ENCOUNTER — Encounter: Payer: Self-pay | Admitting: Orthopedic Surgery

## 2023-05-20 DIAGNOSIS — M5416 Radiculopathy, lumbar region: Secondary | ICD-10-CM

## 2023-06-05 ENCOUNTER — Ambulatory Visit
Admission: RE | Admit: 2023-06-05 | Discharge: 2023-06-05 | Disposition: A | Discharge status: Home / Self Care | Source: Ambulatory Visit | Attending: Orthopedic Surgery | Admitting: Orthopedic Surgery

## 2023-06-05 DIAGNOSIS — M48061 Spinal stenosis, lumbar region without neurogenic claudication: Secondary | ICD-10-CM | POA: Diagnosis not present

## 2023-06-05 DIAGNOSIS — M5416 Radiculopathy, lumbar region: Secondary | ICD-10-CM | POA: Diagnosis not present

## 2023-06-07 DIAGNOSIS — M5416 Radiculopathy, lumbar region: Secondary | ICD-10-CM | POA: Diagnosis not present

## 2023-06-07 DIAGNOSIS — M47812 Spondylosis without myelopathy or radiculopathy, cervical region: Secondary | ICD-10-CM | POA: Diagnosis not present

## 2023-06-12 ENCOUNTER — Other Ambulatory Visit: Payer: Self-pay | Admitting: Neurological Surgery

## 2023-06-12 DIAGNOSIS — M47812 Spondylosis without myelopathy or radiculopathy, cervical region: Secondary | ICD-10-CM

## 2023-06-29 DIAGNOSIS — M47812 Spondylosis without myelopathy or radiculopathy, cervical region: Secondary | ICD-10-CM | POA: Diagnosis not present

## 2023-06-29 DIAGNOSIS — M4802 Spinal stenosis, cervical region: Secondary | ICD-10-CM | POA: Diagnosis not present

## 2023-06-29 DIAGNOSIS — M4313 Spondylolisthesis, cervicothoracic region: Secondary | ICD-10-CM | POA: Diagnosis not present

## 2023-06-29 DIAGNOSIS — M2548 Effusion, other site: Secondary | ICD-10-CM | POA: Diagnosis not present

## 2023-07-03 ENCOUNTER — Ambulatory Visit: Admitting: Family Medicine

## 2023-07-19 DIAGNOSIS — M47812 Spondylosis without myelopathy or radiculopathy, cervical region: Secondary | ICD-10-CM | POA: Diagnosis not present

## 2023-07-20 ENCOUNTER — Ambulatory Visit: Payer: PPO | Admitting: *Deleted

## 2023-07-20 ENCOUNTER — Telehealth: Payer: Self-pay

## 2023-07-20 DIAGNOSIS — Z0000 Encounter for general adult medical examination without abnormal findings: Secondary | ICD-10-CM

## 2023-07-20 NOTE — Patient Instructions (Signed)
 Taylor Taylor , Thank you for taking time to come for your Medicare Wellness Visit. I appreciate your ongoing commitment to your health goals. Please review the following plan we discussed and let me know if I can assist you in the future.   Screening recommendations/referrals: Colonoscopy: no longer required Mammogram: up tod ate Bone Density: up to date Recommended yearly ophthalmology/optometry visit for glaucoma screening and checkup Recommended yearly dental visit for hygiene and checkup  Vaccinations: Influenza vaccine: up to date Pneumococcal vaccine: up to date Tdap vaccine: Education provided Shingles vaccine: up to date     Preventive Care 65 Years and Older, Female Preventive care refers to lifestyle choices and visits with your health care provider that can promote health and wellness. What does preventive care include? A yearly physical exam. This is also called an annual well check. Dental exams once or twice a year. Routine eye exams. Ask your health care provider how often you should have your eyes checked. Personal lifestyle choices, including: Daily care of your teeth and gums. Regular physical activity. Eating a healthy diet. Avoiding tobacco and drug use. Limiting alcohol use. Practicing safe sex. Taking low-dose aspirin every day. Taking vitamin and mineral supplements as recommended by your health care provider. What happens during an annual well check? The services and screenings done by your health care provider during your annual well check will depend on your age, overall health, lifestyle risk factors, and family history of disease. Counseling  Your health care provider may ask you questions about your: Alcohol use. Tobacco use. Drug use. Emotional well-being. Home and relationship well-being. Sexual activity. Eating habits. History of falls. Memory and ability to understand (cognition). Work and work Astronomer. Reproductive health. Screening   You may have the following tests or measurements: Height, weight, and BMI. Blood pressure. Lipid and cholesterol levels. These may be checked every 5 years, or more frequently if you are over 25 years old. Skin check. Lung cancer screening. You may have this screening every year starting at age 73 if you have a 30-pack-year history of smoking and currently smoke or have quit within the past 15 years. Fecal occult blood test (FOBT) of the stool. You may have this test every year starting at age 23. Flexible sigmoidoscopy or colonoscopy. You may have a sigmoidoscopy every 5 years or a colonoscopy every 10 years starting at age 33. Hepatitis C blood test. Hepatitis B blood test. Sexually transmitted disease (STD) testing. Diabetes screening. This is done by checking your blood sugar (glucose) after you have not eaten for a while (fasting). You may have this done every 1-3 years. Bone density scan. This is done to screen for osteoporosis. You may have this done starting at age 100. Mammogram. This may be done every 1-2 years. Talk to your health care provider about how often you should have regular mammograms. Talk with your health care provider about your test results, treatment options, and if necessary, the need for more tests. Vaccines  Your health care provider may recommend certain vaccines, such as: Influenza vaccine. This is recommended every year. Tetanus, diphtheria, and acellular pertussis (Tdap, Td) vaccine. You may need a Td booster every 10 years. Zoster vaccine. You may need this after age 30. Pneumococcal 13-valent conjugate (PCV13) vaccine. One dose is recommended after age 46. Pneumococcal polysaccharide (PPSV23) vaccine. One dose is recommended after age 58. Talk to your health care provider about which screenings and vaccines you need and how often you need them. This information is  not intended to replace advice given to you by your health care provider. Make sure you discuss  any questions you have with your health care provider. Document Released: 04/03/2015 Document Revised: 11/25/2015 Document Reviewed: 01/06/2015 Elsevier Interactive Patient Education  2017 [employer].  Fall Prevention in the Home Falls can cause injuries. They can happen to people of all ages. There are many things you can do to make your home safe and to help prevent falls. What can I do on the outside of my home? Regularly fix the edges of walkways and driveways and fix any cracks. Remove anything that might make you trip as you walk through a door, such as a raised step or threshold. Trim any bushes or trees on the path to your home. Use bright outdoor lighting. Clear any walking paths of anything that might make someone trip, such as rocks or tools. Regularly check to see if handrails are loose or broken. Make sure that both sides of any steps have handrails. Any raised decks and porches should have guardrails on the edges. Have any leaves, snow, or ice cleared regularly. Use sand or salt on walking paths during winter. Clean up any spills in your garage right away. This includes oil or grease spills. What can I do in the bathroom? Use night lights. Install grab bars by the toilet and in the tub and shower. Do not use towel bars as grab bars. Use non-skid mats or decals in the tub or shower. If you need to sit down in the shower, use a plastic, non-slip stool. Keep the floor dry. Clean up any water that spills on the floor as soon as it happens. Remove soap buildup in the tub or shower regularly. Attach bath mats securely with double-sided non-slip rug tape. Do not have throw rugs and other things on the floor that can make you trip. What can I do in the bedroom? Use night lights. Make sure that you have a light by your bed that is easy to reach. Do not use any sheets or blankets that are too big for your bed. They should not hang down onto the floor. Have a firm chair that has  side arms. You can use this for support while you get dressed. Do not have throw rugs and other things on the floor that can make you trip. What can I do in the kitchen? Clean up any spills right away. Avoid walking on wet floors. Keep items that you use a lot in easy-to-reach places. If you need to reach something above you, use a strong step stool that has a grab bar. Keep electrical cords out of the way. Do not use floor polish or wax that makes floors slippery. If you must use wax, use non-skid floor wax. Do not have throw rugs and other things on the floor that can make you trip. What can I do with my stairs? Do not leave any items on the stairs. Make sure that there are handrails on both sides of the stairs and use them. Fix handrails that are broken or loose. Make sure that handrails are as long as the stairways. Check any carpeting to make sure that it is firmly attached to the stairs. Fix any carpet that is loose or worn. Avoid having throw rugs at the top or bottom of the stairs. If you do have throw rugs, attach them to the floor with carpet tape. Make sure that you have a light switch at the top of the  stairs and the bottom of the stairs. If you do not have them, ask someone to add them for you. What else can I do to help prevent falls? Wear shoes that: Do not have high heels. Have rubber bottoms. Are comfortable and fit you well. Are closed at the toe. Do not wear sandals. If you use a stepladder: Make sure that it is fully opened. Do not climb a closed stepladder. Make sure that both sides of the stepladder are locked into place. Ask someone to hold it for you, if possible. Clearly mark and make sure that you can see: Any grab bars or handrails. First and last steps. Where the edge of each step is. Use tools that help you move around (mobility aids) if they are needed. These include: Canes. Walkers. Scooters. Crutches. Turn on the lights when you go into a dark area.  Replace any light bulbs as soon as they burn out. Set up your furniture so you have a clear path. Avoid moving your furniture around. If any of your floors are uneven, fix them. If there are any pets around you, be aware of where they are. Review your medicines with your doctor. Some medicines can make you feel dizzy. This can increase your chance of falling. Ask your doctor what other things that you can do to help prevent falls. This information is not intended to replace advice given to you by your health care provider. Make sure you discuss any questions you have with your health care provider. Document Released: 01/01/2009 Document Revised: 08/13/2015 Document Reviewed: 04/11/2014 Elsevier Interactive Patient Education  2017 [employer].

## 2023-07-20 NOTE — Telephone Encounter (Signed)
 Patient called and stated the Dr Belen Bowers was referring her for "recommended" spine and neck injections. I see a note from Dr E but no referral. Can you place the preferred injection referrals? Or do I need to call her and ask the repeat injection questions?

## 2023-07-20 NOTE — Progress Notes (Signed)
 Subjective:   Taylor Taylor is a 78 y.o. female who presents for Medicare Annual (Subsequent) preventive examination.  Visit Complete: Virtual I connected with  Taylor Taylor on 07/20/23 by a audio enabled telemedicine application and verified that I am speaking with the correct person using two identifiers.  Patient Location: Home  Provider Location: Home Office  I discussed the limitations of evaluation and management by telemedicine. The patient expressed understanding and agreed to proceed.  Vital Signs: Because this visit was a virtual/telehealth visit, some criteria may be missing or patient reported. Any vitals not documented were not able to be obtained and vitals that have been documented are patient reported.  Patient Medicare AWV questionnaire was completed by the patient on 07-19-2023; I have confirmed that all information answered by patient is correct and no changes since this date.  Cardiac Risk Factors include: advanced age (>52men, >70 women)     Objective:    Today's Vitals   07/19/23 1835  PainSc: 7    There is no height or weight on file to calculate BMI.     07/20/2023    9:50 AM 08/12/2022    7:53 AM 07/14/2022   11:26 AM 08/05/2021   10:41 AM 07/23/2020    9:57 AM 12/24/2018    6:40 AM  Advanced Directives  Does Patient Have a Medical Advance Directive? Yes No Yes Yes Yes Yes  Type of Diplomatic Services operational officer  Living will Healthcare Power of [place];Living will Healthcare Power of [place];Living will Healthcare Power of [place];Living will  Does patient want to make changes to medical advance directive?   No - Patient declined  No - Patient declined   Copy of Healthcare Power of Attorney in Chart? No - copy requested   No - copy requested No - copy requested   Would patient like information on creating a medical advance directive?  No - Patient declined        Current Medications (verified) Outpatient Encounter  Medications as of 07/20/2023  Medication Sig   acetaminophen  (TYLENOL ) 500 MG tablet Take 500 mg by mouth 2 (two) times daily.   alendronate  (FOSAMAX ) 70 MG tablet TAKE 1 TABLET BY MOUTH ONCE WEEKLY BEFORE THE FIRST FOOD, BEVERAGE OR MEDICINE OF THE DAY WITH PLAIN WATER   Calcium Carbonate-Vit D-Min (CALCIUM 1200 PO) Take 1 tablet by mouth in the morning and at bedtime.   cholecalciferol (VITAMIN D3) 25 MCG (1000 UNIT) tablet Take 125 mcg by mouth daily.   diclofenac  (VOLTAREN ) 75 MG EC tablet Take 1 tablet (75 mg total) by mouth 2 (two) times daily.   ferrous sulfate 325 (65 FE) MG tablet Take 325 mg by mouth daily with breakfast.   Fish Oil-Cholecalciferol (FISH OIL + D3) 1200-1000 MG-UNIT CAPS Take 1 tablet by mouth daily.   Glucosamine-Chondroit-Vit C-Mn (GLUCOSAMINE CHONDR 1500 COMPLX) CAPS Take 1 capsule by mouth 2 (two) times daily.   Lifitegrast (XIIDRA) 5 % SOLN Apply to eye.   Multiple Vitamins-Minerals (WOMENS MULTI VITAMIN & MINERAL PO) Take 1 tablet by mouth daily.   Red Yeast Rice 600 MG CAPS Take 600 mg by mouth daily.    Turmeric 500 MG TABS Take 500 mg by mouth daily.    vitamin B-12 (CYANOCOBALAMIN ) 100 MCG tablet Take 100 mcg by mouth daily.   vitamin E 100 UNIT capsule Take 100 Units by mouth daily.   pregabalin  (LYRICA ) 75 MG capsule Take 1 capsule (75 mg total) by mouth 2 (two)  times daily.   Facility-Administered Encounter Medications as of 07/20/2023  Medication   0.9 %  sodium chloride  infusion    Allergies (verified) Pollen extract   History: Past Medical History:  Diagnosis Date   Arthritis    back   Fatigue 11/10/2021   GERD (gastroesophageal reflux disease)    in past   Hypertriglyceridemia without hypercholesterolemia 09/03/2013   Mild cognitive impairment of uncertain or unknown etiology 08/16/2022   Neck pain on right side 12/25/2018   Osteopenia 11/16/2015   h/o bisphos use in records. PCP manages, dexa normal 2020, per pt as of 2022, osteopenia  again, advised to f/u with PCP to discussed tx plan   Pain in left hand 05/18/2018   Pain in right elbow 10/15/2020   Pain in right hand 05/18/2018   Pain in right hip 04/30/2019   Pain in right shoulder 12/11/2018   Precordial chest pain 08/31/2009   Seasonal allergies    Unilateral primary osteoarthritis, right knee 08/24/2016   Viral gastroenteritis 07/13/2022   Past Surgical History:  Procedure Laterality Date   BACK SURGERY     Scheduled for 08/19/2021., Dr, Alice Innocent.   CERVICAL DISC SURGERY     over 10 years   COLONOSCOPY     Family History  Problem Relation Age of Onset   Parkinson's disease Mother    Brain cancer Father    Breast cancer Neg Hx    Social History   Socioeconomic History   Marital status: Married    Spouse name: Not on file   Number of children: 2   Years of education: 13   Highest education level: Associate degree: occupational, Scientist, product/process development, or vocational program  Occupational History   Occupation: Retired  Tobacco Use   Smoking status: Never   Smokeless tobacco: Never  Vaping Use   Vaping status: Never Used  Substance and Sexual Activity   Alcohol use: No   Drug use: No   Sexual activity: Not Currently  Other Topics Concern   Not on file  Social History Narrative   Right handed   Drinks coffee   Lives with husband   2 story home   retired   Chief Executive Officer Drivers of Corporate investment banker Strain: Low Risk  (07/20/2023)   Overall Financial Resource Strain (CARDIA)    Difficulty of Paying Living Expenses: Not hard at all  Food Insecurity: No Food Insecurity (07/20/2023)   Hunger Vital Sign    Worried About Running Out of Food in the Last Year: Never true    Ran Out of Food in the Last Year: Never true  Transportation Needs: No Transportation Needs (07/20/2023)   PRAPARE - Administrator, Civil Service (Medical): No    Lack of Transportation (Non-Medical): No  Physical Activity: Sufficiently Active (07/20/2023)   Exercise Vital Sign     Days of Exercise per Week: 4 days    Minutes of Exercise per Session: 40 min  Stress: No Stress Concern Present (07/20/2023)   [employer] of Occupational Health - Occupational Stress Questionnaire    Feeling of Stress : Not at all  Social Connections: Socially Integrated (07/20/2023)   Social Connection and Isolation Panel [NHANES]    Frequency of Communication with Friends and Family: Three times a week    Frequency of Social Gatherings with Friends and Family: More than three times a week    Attends Religious Services: More than 4 times per year    Active Member of [employer] or Organizations:  Yes    Attends Banker Meetings: More than 4 times per year    Marital Status: Married    Tobacco Counseling Counseling given: Not Answered   Clinical Intake:  Pre-visit preparation completed: Yes  Pain : 0-10 Pain Score: 7  Pain Type: Chronic pain Pain Location: Back Pain Descriptors / Indicators: Aching, Dull, Burning Pain Onset: More than a month ago Pain Frequency: Constant     Diabetes: No  How often do you need to have someone help you when you read instructions, pamphlets, or other written materials from your doctor or pharmacy?: 1 - Never  Interpreter Needed?: No  Information entered by :: Kieth Pelt LPN   Activities of Daily Living    07/20/2023    9:51 AM 07/19/2023    6:35 PM  In your present state of health, do you have any difficulty performing the following activities:  Hearing? 0 0  Vision? 0 0  Difficulty concentrating or making decisions? 0 0  Walking or climbing stairs? 0 0  Dressing or bathing? 0 0  Doing errands, shopping? 0 0  Preparing Food and eating ? N N  Using the Toilet? N N  In the past six months, have you accidently leaked urine? N N  Do you have problems with loss of bowel control? N N  Managing your Medications? N N  Managing your Finances? N N  Housekeeping or managing your Housekeeping? N N    Patient Care  Team: Austine Lefort, MD as PCP - General (Family Medicine) Myrle Aspen, [facility] (Inactive) as Pharmacist (Pharmacist) [place], [facility]  Indicate any recent Medical Services you may have received from other than [facility] providers in the past year (date may be approximate).     Assessment:   This is a routine wellness examination for Miho.  Hearing/Vision screen Hearing Screening - Comments:: No trouble hearing Vision Screening - Comments:: Up to date Unsure of name   Goals Addressed             This Visit's Progress    Weight (lb) < 200 lb (90.7 kg)         Depression Screen    07/20/2023    9:53 AM 02/27/2023   10:54 AM 12/15/2022   10:27 AM 07/14/2022   11:22 AM 07/13/2022   11:20 AM 06/02/2022    3:01 PM 11/18/2021    9:18 AM  PHQ 2/9 Scores  PHQ - 2 Score 0 3 4 0 0 0 0  PHQ- 9 Score 2 4 9 3 3       Fall Risk    07/20/2023    9:51 AM 07/19/2023    6:35 PM 02/27/2023   10:54 AM 12/15/2022   10:27 AM 08/12/2022    7:52 AM  Fall Risk   Falls in the past year? 0 0 0 0 0  Number falls in past yr: 0  0 0 0  Injury with Fall? 0  0 0 0  Risk for fall due to :    No Fall Risks   Follow up Falls evaluation completed;Education provided;Falls prevention discussed   Falls prevention discussed Falls evaluation completed    MEDICARE RISK AT HOME: Medicare Risk at Home Any stairs in or around the home?: Yes If so, are there any without handrails?: No Home free of loose throw rugs in walkways, pet beds, electrical cords, etc?: No Adequate lighting in your home to reduce risk of falls?: Yes Life alert?: No Use  of a cane, walker or w/c?: No Grab bars in the bathroom?: No Shower chair or bench in shower?: Yes Elevated toilet seat or a handicapped toilet?: No  TIMED UP AND GO:  Was the test performed?  No    Cognitive Function:      08/12/2022    8:00 AM  Montreal Cognitive Assessment   Visuospatial/ Executive (0/5) 3  Naming (0/3) 3  Attention:  Read list of digits (0/2) 2  Attention: Read list of letters (0/1) 1  Attention: Serial 7 subtraction starting at 100 (0/3) 3  Language: Repeat phrase (0/2) 2  Language : Fluency (0/1) 0  Abstraction (0/2) 0  Delayed Recall (0/5) 4  Orientation (0/6) 6  Total 24  Adjusted Score (based on education) 24      07/20/2023    9:52 AM 07/14/2022   11:25 AM 08/05/2021   10:43 AM  6CIT Screen  What Year? 0 points 0 points 0 points  What month? 0 points 0 points 0 points  What time? 0 points 0 points 0 points  Count back from 20 0 points 0 points 0 points  Months in reverse 0 points 2 points 4 points  Repeat phrase 2 points 2 points 0 points  Total Score 2 points 4 points 4 points    Immunizations Immunization History  Administered Date(s) Administered   Fluad Quad(high Dose 65+) 12/06/2018   Fluad Trivalent(High Dose 65+) 12/15/2022   Hepatitis A 12/10/1997, 09/17/1998   Hepatitis A, Ped/Adol-2 Dose 12/10/1997, 09/17/1998   Hepatitis B 12/10/1997, 01/09/1998, 08/11/1998   Hepatitis B, PED/ADOLESCENT 12/10/1997, 01/09/1998, 08/11/1998   Influenza Inj Mdck Quad With Preservative 11/19/2016, 11/20/2019   Influenza Split 01/18/2012   Influenza, High Dose Seasonal PF 12/22/2015, 10/27/2016, 12/15/2017, 12/06/2018   Influenza,inj,Quad PF,6+ Mos 01/01/2013, 01/07/2014, 12/30/2014   Influenza-Unspecified 12/20/2014, 10/27/2016, 12/23/2020, 12/19/2021   PFIZER(Purple Top)SARS-COV-2 Vaccination 04/12/2019, 05/02/2019, 07/20/2020   Pfizer Covid-19 Vaccine Bivalent Booster 33yrs & up 12/23/2020   Pneumococcal Conjugate-13 07/17/2014   Pneumococcal Polysaccharide-23 07/12/2011   Td 12/09/1997   Td (Adult),5 Lf Tetanus Toxid, Preservative Free 12/09/1997   Tdap 07/12/2011   Zoster Recombinant(Shingrix) 08/12/2016, 12/24/2016   Zoster, Live 02/26/2010    TDAP status: Due, Education has been provided regarding the importance of this vaccine. Advised may receive this vaccine at local pharmacy or  Health Dept. Aware to provide a copy of the vaccination record if obtained from local pharmacy or Health Dept. Verbalized acceptance and understanding.  Flu Vaccine status: Up to date  Pneumococcal vaccine status: Up to date  Covid-19 vaccine status: Information provided on how to obtain vaccines.   Qualifies for Shingles Vaccine? No   Zostavax completed Yes   Shingrix Completed?: Yes  Screening Tests Health Maintenance  Topic Date Due   COVID-19 Vaccine (5 - 2024-25 season) 11/20/2022   INFLUENZA VACCINE  10/20/2023   MAMMOGRAM  11/07/2023   Medicare Annual Wellness (AWV)  07/19/2024   Pneumonia Vaccine 84+ Years old  Completed   DEXA SCAN  Completed   Hepatitis C Screening  Completed   Zoster Vaccines- Shingrix  Completed   HPV VACCINES  Aged Out   Meningococcal B Vaccine  Aged Out   DTaP/Tdap/Td  Discontinued    Health Maintenance  Health Maintenance Due  Topic Date Due   COVID-19 Vaccine (5 - 2024-25 season) 11/20/2022    Colorectal cancer screening: No longer required.   Mammogram status: Completed  . Repeat every year  Bone Density status: Completed 2024. Results reflect:  Bone density results: OSTEOPENIA. Repeat every 3 years.  Lung Cancer Screening: (Low Dose CT Chest recommended if Age 9-80 years, 20 pack-year currently smoking OR have quit w/in 15years.) does not qualify.   Lung Cancer Screening Referral:   Additional Screening:  Hepatitis C Screening: does not qualify; Completed 2017  Vision Screening: Recommended annual ophthalmology exams for early detection of glaucoma and other disorders of the eye. Is the patient up to date with their annual eye exam?  Yes  Who is the provider or what is the name of the office in which the patient attends annual eye exams? Unsure of name If pt is not established with a provider, would they like to be referred to a provider to establish care? No .   Dental Screening: Recommended annual dental exams for proper oral  hygiene    Community Resource Referral / Chronic Care Management: CRR required this visit?  No   CCM required this visit?  No     Plan:     I have personally reviewed and noted the following in the patient's chart:   Medical and social history Use of alcohol, tobacco or illicit drugs  Current medications and supplements including opioid prescriptions. Patient is not currently taking opioid prescriptions. Functional ability and status Nutritional status Physical activity Advanced directives List of other physicians Hospitalizations, surgeries, and ER visits in previous 12 months Vitals Screenings to include cognitive, depression, and falls Referrals and appointments  In addition, I have reviewed and discussed with patient certain preventive protocols, quality metrics, and best practice recommendations. A written personalized care plan for preventive services as well as general preventive health recommendations were provided to patient.     Kieth Pelt, LPN   03/26/1094   After Visit Summary: (MyChart) Due to this being a telephonic visit, the after visit summary with patients personalized plan was offered to patient via MyChart   Nurse Notes:

## 2023-07-31 ENCOUNTER — Telehealth: Payer: Self-pay

## 2023-07-31 NOTE — Telephone Encounter (Signed)
 Patient is requesting her medical records. She has asked to pick them up by Wed. I let her know medical records would be contacting her. She is going to go to Dr Ellery Guthrie.

## 2023-08-01 ENCOUNTER — Ambulatory Visit: Admitting: Physical Medicine and Rehabilitation

## 2023-08-15 ENCOUNTER — Encounter: Payer: Self-pay | Admitting: Physical Medicine and Rehabilitation

## 2023-08-15 ENCOUNTER — Ambulatory Visit: Admitting: Physical Medicine and Rehabilitation

## 2023-08-15 DIAGNOSIS — M47819 Spondylosis without myelopathy or radiculopathy, site unspecified: Secondary | ICD-10-CM | POA: Diagnosis not present

## 2023-08-15 DIAGNOSIS — M542 Cervicalgia: Secondary | ICD-10-CM | POA: Diagnosis not present

## 2023-08-15 DIAGNOSIS — M48061 Spinal stenosis, lumbar region without neurogenic claudication: Secondary | ICD-10-CM

## 2023-08-15 DIAGNOSIS — M5416 Radiculopathy, lumbar region: Secondary | ICD-10-CM

## 2023-08-15 DIAGNOSIS — M47812 Spondylosis without myelopathy or radiculopathy, cervical region: Secondary | ICD-10-CM

## 2023-08-15 DIAGNOSIS — G894 Chronic pain syndrome: Secondary | ICD-10-CM | POA: Diagnosis not present

## 2023-08-15 NOTE — Progress Notes (Unsigned)
 Core Outcome Measures Index (COMI) Back Score  Average Pain 8  COMI Score 80%

## 2023-08-15 NOTE — Progress Notes (Unsigned)
 Pain Scale   Average Pain 7 Patient states she is here for an MRI review due to severe neck and back Chronic pain.        +Driver, -BT, -Dye Allergies.

## 2023-08-15 NOTE — Progress Notes (Unsigned)
 Taylor Taylor - 78 y.o. female MRN [id number]  Date of birth: 1945-10-17  Office Visit Note: Visit Date: 08/15/2023 PCP: Austine Lefort, MD Referred by: Austine Lefort, MD  Subjective: Chief Complaint  Patient presents with   Neck - Pain   Lower Back - Pain   HPI: Taylor Taylor is a 78 y.o. female who comes in today for evaluation of two chronic, worsening and severe issues. Chronic bilateral neck pain and chronic bilateral lower back pain. She reports bilateral neck pain that worsens with side to side rotation, she describes pain as sore and aching sensation, currently rates as 6 out of 10. She reports decreased range of motion, feels she is not able to rotate her neck. Some relief of pain with home exercise regimen, rest and use of medications. Recent cervical MRI imaging shows progression of left C2-C3 facet arthropathy appears since last year, with new mild facet marrow edema, trace joint fluid, and a new posteriorly situated synovial cyst. There is also moderate facet hypertrophy greater on the right at C3-C4. History of C5-C6 ACDF with Dr. Elna Haggis greater than 10 years ago. We performed right C7-T1 interlaminar epidural steroid injection in our office on 03/30/2023, she reports good relief of right sided radicular symptoms with this procedure, greater than 80% and continues to sustain.   Also reports lower back pain radiating down bilateral lateral aspects of legs, left greater than right. Her pain worsens when laying down to sleep, she reports difficulty getting comfortable when sleeping. She describes her pain as sore and throbbing sensation, currently rates as 8 out of 10. Some relief of pain with home exercise regimen, rest and use of medications. She does take Diclofenac  and uses topical Voltaren  gel daily. Recent lumbar MRI imaging shows moderate spinal canal stenosis at L2-L3. There is also moderate foraminal stenosis bilaterally at L5-S1. She underwent left S1  transforaminal epidural steroid injection in our office on 05/11/2023, she reports greater than 80% relief of pain for about 2 weeks with this procedure. Patient denies focal weakness, numbness and tingling. No recent trauma or falls.   She is currently managed by Dr. Ellery Guthrie with [facility]. He did send us  referral that outlined injection recommendations for both cervical and lumbar spine.   Of note, she also mentions chronic diffuse body pain for several months, worsened over the last 3 weeks. Feels her entire body is sore.      Review of Systems  Musculoskeletal:  Positive for back pain, joint pain, myalgias and neck pain.  Neurological:  Negative for tingling, sensory change, focal weakness and weakness.  All other systems reviewed and are negative.  Otherwise per HPI.  Assessment & Plan: Visit Diagnoses:    ICD-10-CM   1. Cervicalgia  M54.2 Ambulatory referral to Physical Medicine Rehab    2. Spondylosis without myelopathy or radiculopathy  M47.819 Ambulatory referral to Physical Medicine Rehab    3. Facet arthropathy, cervical  M47.812 Ambulatory referral to Physical Medicine Rehab    4. Lumbar radiculopathy  M54.16 Ambulatory referral to Physical Medicine Rehab    5. Spinal stenosis of lumbar region, unspecified whether neurogenic claudication present  M48.061 Ambulatory referral to Physical Medicine Rehab    6. Chronic pain syndrome  G89.4        Plan: Findings:  1. Chronic, worsening and severe bilateral neck pain.  No radicular symptoms down the arms.  She continues to have severe pain despite good conservative therapies such as home  exercise regimen, rest and use of medications.  Patient's clinical presentation and exam are consistent with facet mediated pain.  Recent cervical MRI imaging does show facet arthropathy on the left at C2-C3 with marrow edema and joint fluid.  There is also moderate facet hypertrophy greater on the right at C3-C4.  She does  have fairly significant pain with side-to-side rotation today.  We discussed treatment plan in detail today.  Next step is to perform diagnostic and hopefully therapeutic bilateral C3-C4 facet joint injections under fluoroscopic guidance.  If good relief of pain with this injection we can repeat this procedure infrequently as needed.  She has no questions regarding injection procedure at this time.  2.  Chronic, worsening and severe bilateral lower back pain radiating down lateral regions of both legs, left greater than right.  She continues to have severe pain despite good conservative therapies such as home exercise regimen, rest and use of medications.  Recent lumbar MRI imaging does show moderate spinal canal stenosis at the level of L2-L3. Moderate bilateral foraminal stenosis at L5-S1. Her symptoms today do fit more with an L5 nerve pattern. We discussed treatment plan in detail today. Next step is to perform diagnostic and hopefully therapeutic left L2-L3 interlaminar epidural steroid injection under fluoroscopic guidance. If good relief of pain with this injection we can repeat this procedure infrequently as needed.   3. Chronic diffuse body pain. Her body pain has worsened over the last 3 weeks. Would recommend she speak with her primary care provider Dr. Cheril Cork. Could look at obtaining rheumatological panel. I explained to her that diffuse body pain is likely not stemming from her spine issues.     Meds & Orders: No orders of the defined types were placed in this encounter.   Orders Placed This Encounter  Procedures   Ambulatory referral to Physical Medicine Rehab    Follow-up: Return for Left L2-L3 interlaminar epidural steroid injection and bilateral C3-C4 facet joint injections.   Procedures: No procedures performed      Clinical History: MRI CERVICAL SPINE WITHOUT CONTRAST  TECHNIQUE: Multiplanar, multisequence MR imaging of the cervical spine was performed. No intravenous  contrast was administered.  COMPARISON: Cervical spine MRI 08/30/2022.  FINDINGS: Alignment: Stable mild straightening of cervical lordosis. Mild degenerative appearing retrolisthesis of L4 on L5 is stable.  Vertebrae: Stable ACDF hardware at C5-C6. MRI suggest solid arthrodesis at those levels. Background bone marrow signal is within normal limits. Chronic degenerative marrow endplate changes at C4-C5 and C6-C7 adjacent segments. Faint degenerative appearing marrow edema now in the left C2 facet (series 5, image 12) with trace degenerative appearing facet joint fluid there and a new posteriorly situated synovial cyst (series 4, image 12) see additional details below.  Cord: Up to mild degenerative cervical spinal cord mass effect but no cord signal abnormality. See details below.  Posterior Fossa, vertebral arteries, paraspinal tissues: Cervicomedullary junction is within normal limits. Grossly stable and negative visible posterior fossa, brain parenchyma.  Preserved major vascular flow voids in the bilateral neck. Left vertebral artery slightly dominant as before. Negative visible neck soft tissues, including adjacent to the left C2-C3 is synovial cyst on series 8, image 10. Negative visible lung apices.  Disc levels:  C2-C3: Moderate facet hypertrophy on the left appears somewhat progressed from last year. Mild degenerative facet joint fluid, mild C2 facet marrow edema and new posteriorly situated synovial cyst (should not cause neural compromise series 5, image 12). But there is moderate to severe left C3  neural foraminal stenosis when combined with asymmetric endplate spurring, and appears increased. Mild contralateral right facet hypertrophy.  C3-C4: Largely negative disc and endplate. Moderate facet hypertrophy greater on the right. No spinal stenosis. Mild to moderate right C4 foraminal stenosis is stable.  C4-C5: Severe disc space loss, circumferential disc  osteophyte complex with subtle underlying retrolisthesis. Mild to moderate facet and ligament flavum hypertrophy. Mild spinal stenosis match that mild spinal stenosis (series 8, image 15). Mild if any spinal cord mass effect. Moderate bilateral C5 foraminal stenosis. This level appears stable.  C5-C6: Chronic ACDF.  C6-C7: Moderate disc space loss, circumferential disc osteophyte complex. Moderate facet and moderate to severe ligament flavum hypertrophy. Mild spinal stenosis and mild spinal cord mass effect best seen on series 4, image 8. mild to moderate bilateral C7 foraminal stenosis. This level appears stable.  C7-T1: Subtle anterolisthesis. Moderate facet and ligament flavum hypertrophy. No convincing stenosis.  Partially visible chronic upper thoracic disc degeneration.  IMPRESSION: 1. Chronic C5-C6 ACDF with stable adjacent segment disease at both C4-C5 and C5-C6 from last year including mild multifactorial spinal stenosis, up to mild spinal cord mass effect, and moderate biforaminal stenosis. No spinal cord signal abnormality.  2. Progression of Left C2-C3 facet arthropathy appears since last year, with new mild facet marrow edema, trace joint fluid, and a new posteriorly situated synovial cyst. Underlying moderate to severe left C3 neural foraminal stenosis also appears progressed.   Electronically Signed By: Marlise Simpers M.D. On: 07/11/2023 10:16 ---- MRI LUMBAR SPINE WITHOUT CONTRAST  IMPRESSION: 1. Mildly progressive lumbar disc and facet degeneration since 2023. 2. Moderate spinal stenosis at L2-3. 3. Mild spinal stenosis and moderate left lateral recess stenosis at L3-4. 4. Moderate bilateral neural foraminal stenosis at L5-S1.     Electronically Signed   By: Aundra Lee M.D.   On: 06/29/2023 14:16   She reports that she has never smoked. She has never used smokeless tobacco. No results for input(s): "HGBA1C", "LABURIC" in the last 8760 hours.  Objective:   VS:  HT:    WT:   BMI:     BP:   HR: bpm  TEMP: ( )  RESP:  Physical Exam Vitals and nursing note reviewed.  HENT:     Head: Normocephalic and atraumatic.     Right Ear: External ear normal.     Left Ear: External ear normal.     Nose: Nose normal.     Mouth/Throat:     Mouth: Mucous membranes are moist.  Eyes:     Extraocular Movements: Extraocular movements intact.  Cardiovascular:     Rate and Rhythm: Normal rate.     Pulses: Normal pulses.  Pulmonary:     Effort: Pulmonary effort is normal.  Abdominal:     General: Abdomen is flat. There is no distension.  Musculoskeletal:        General: Tenderness present.     Cervical back: Tenderness present.     Comments: Patient rises from seated position to standing without difficulty. Good lumbar range of motion. No pain noted with facet loading. 5/5 strength noted with bilateral hip flexion, knee flexion/extension, ankle dorsiflexion/plantarflexion and EHL. No clonus noted bilaterally. No pain upon palpation of greater trochanters. No pain with internal/external rotation of bilateral hips. Sensation intact bilaterally. Dysesthesias noted to bilateral L5 dermatomes. Negative slump test bilaterally. Ambulates without aid, gait steady.   Discomfort noted with side-to-side rotation. Patient has good strength in the upper extremities including 5 out of 5 strength  in wrist extension, long finger flexion and APB. Shoulder range of motion is full bilaterally without any sign of impingement. There is no atrophy of the hands intrinsically. Sensation intact bilaterally. Negative Hoffman's sign. Negative Spurling's sign.       Skin:    General: Skin is warm and dry.     Capillary Refill: Capillary refill takes less than 2 seconds.  Neurological:     General: No focal deficit present.     Mental Status: She is alert and oriented to person, place, and time.  Psychiatric:        Mood and Affect: Mood normal.        Behavior: Behavior normal.      Ortho Exam  Imaging: No results found.  Past Medical/Family/Surgical/Social History: Medications & Allergies reviewed per EMR, new medications updated. Patient Active Problem List   Diagnosis Date Noted   Arthritis 08/16/2022   Seasonal allergies 08/16/2022   Mild cognitive impairment of uncertain or unknown etiology 08/16/2022   Viral gastroenteritis 07/13/2022   Fatigue 11/10/2021   Unilateral primary osteoarthritis, right knee 08/24/2016   Osteopenia 11/16/2015   Hypertriglyceridemia without hypercholesterolemia 09/03/2013   Past Medical History:  Diagnosis Date   Arthritis    back   Fatigue 11/10/2021   GERD (gastroesophageal reflux disease)    in past   Hypertriglyceridemia without hypercholesterolemia 09/03/2013   Mild cognitive impairment of uncertain or unknown etiology 08/16/2022   Neck pain on right side 12/25/2018   Osteopenia 11/16/2015   h/o bisphos use in records. PCP manages, dexa normal 2020, per pt as of 2022, osteopenia again, advised to f/u with PCP to discussed tx plan   Pain in left hand 05/18/2018   Pain in right elbow 10/15/2020   Pain in right hand 05/18/2018   Pain in right hip 04/30/2019   Pain in right shoulder 12/11/2018   Precordial chest pain 08/31/2009   Seasonal allergies    Unilateral primary osteoarthritis, right knee 08/24/2016   Viral gastroenteritis 07/13/2022   Family History  Problem Relation Age of Onset   Parkinson's disease Mother    Brain cancer Father    Breast cancer Neg Hx    Past Surgical History:  Procedure Laterality Date   BACK SURGERY     Scheduled for 08/19/2021., Dr, Alice Innocent.   CERVICAL DISC SURGERY     over 10 years   COLONOSCOPY     Social History   Occupational History   Occupation: Retired  Tobacco Use   Smoking status: Never   Smokeless tobacco: Never  Vaping Use   Vaping status: Never Used  Substance and Sexual Activity   Alcohol use: No   Drug use: No   Sexual activity: Not Currently

## 2023-08-16 ENCOUNTER — Telehealth: Payer: Self-pay

## 2023-08-16 NOTE — Telephone Encounter (Signed)
 Pt's bone density scan from 01/26/2023 showing Osteopenia. Copy of report placed in green folder. Thank you.

## 2023-08-17 ENCOUNTER — Ambulatory Visit (INDEPENDENT_AMBULATORY_CARE_PROVIDER_SITE_OTHER): Admitting: Family Medicine

## 2023-08-17 ENCOUNTER — Encounter: Payer: Self-pay | Admitting: Family Medicine

## 2023-08-17 ENCOUNTER — Ambulatory Visit: Admitting: Family Medicine

## 2023-08-17 VITALS — BP 101/60 | HR 81 | Temp 98.5°F | Ht 63.0 in | Wt 131.4 lb

## 2023-08-17 DIAGNOSIS — R5382 Chronic fatigue, unspecified: Secondary | ICD-10-CM | POA: Diagnosis not present

## 2023-08-17 DIAGNOSIS — M1990 Unspecified osteoarthritis, unspecified site: Secondary | ICD-10-CM | POA: Diagnosis not present

## 2023-08-17 DIAGNOSIS — H2511 Age-related nuclear cataract, right eye: Secondary | ICD-10-CM | POA: Diagnosis not present

## 2023-08-17 DIAGNOSIS — H04123 Dry eye syndrome of bilateral lacrimal glands: Secondary | ICD-10-CM | POA: Diagnosis not present

## 2023-08-17 DIAGNOSIS — H524 Presbyopia: Secondary | ICD-10-CM | POA: Diagnosis not present

## 2023-08-17 MED ORDER — DULOXETINE HCL 30 MG PO CPEP: 30.0000 mg | ORAL_CAPSULE | Freq: Every day | ORAL | 1 refills | Status: DC

## 2023-08-17 NOTE — Assessment & Plan Note (Signed)
 No lab available today however I do believe it is reasonable to check inflammatory markers and evaluate for RA at this point given her joint pain and stiffness. CRP, ESR, anti-CCP, and RF ordered.

## 2023-08-17 NOTE — Assessment & Plan Note (Signed)
 Extensive workup done by her PCP. I have no further insights today as to why she is feeling so fatigued. Will start with addressing her chronic pain and lack of energy and motivation due to this. I believe she may benefit from Cymbalta 30mg  daily. Follow up with PCP.

## 2023-08-17 NOTE — Progress Notes (Signed)
 Subjective:  HPI: Taylor Taylor is a 78 y.o. female presenting on 08/17/2023 for Pain (Aching all over: sever joint pain . MRI resulted in Arthritis. Pain is causing trouble sleeping due to shooting pain down legs. )   HPI Patient is in today for months of joint pain "all over her body" and she feels tired all over. Her joints ache and fingers are stiff in AM. She also has knee and hip pain. Is seeing ortho and getting joint injections for LLE radiculopathy and neck pain. She also endorses low energy, has been seen several times by my partner who is her PCP for this with workup dating back to 2021. She denies swelling, redness, or warmth to her joints She does take Voltaren  daily orally as well as uses voltaren  gel occasionally in her back.  Has been treated with Effexor  in past as well for depression however she is not currently taking this.   Review of Systems  All other systems reviewed and are negative.   Relevant past medical history reviewed and updated as indicated.   Past Medical History:  Diagnosis Date   Arthritis    back   Fatigue 11/10/2021   GERD (gastroesophageal reflux disease)    in past   Hypertriglyceridemia without hypercholesterolemia 09/03/2013   Mild cognitive impairment of uncertain or unknown etiology 08/16/2022   Neck pain on right side 12/25/2018   Osteopenia 11/16/2015   h/o bisphos use in records. PCP manages, dexa normal 2020, per pt as of 2022, osteopenia again, advised to f/u with PCP to discussed tx plan   Pain in left hand 05/18/2018   Pain in right elbow 10/15/2020   Pain in right hand 05/18/2018   Pain in right hip 04/30/2019   Pain in right shoulder 12/11/2018   Precordial chest pain 08/31/2009   Seasonal allergies    Unilateral primary osteoarthritis, right knee 08/24/2016   Viral gastroenteritis 07/13/2022     Past Surgical History:  Procedure Laterality Date   BACK SURGERY     Scheduled for 08/19/2021., Dr, Alice Innocent.   CERVICAL  DISC SURGERY     over 10 years   COLONOSCOPY      Allergies and medications reviewed and updated.   Current Outpatient Medications:    acetaminophen  (TYLENOL ) 500 MG tablet, Take 500 mg by mouth 2 (two) times daily., Disp: , Rfl:    alendronate  (FOSAMAX ) 70 MG tablet, TAKE 1 TABLET BY MOUTH ONCE WEEKLY BEFORE THE FIRST FOOD, BEVERAGE OR MEDICINE OF THE DAY WITH PLAIN WATER, Disp: 12 tablet, Rfl: 3   Calcium Carbonate-Vit D-Min (CALCIUM 1200 PO), Take 1 tablet by mouth in the morning and at bedtime., Disp: , Rfl:    cholecalciferol (VITAMIN D3) 25 MCG (1000 UNIT) tablet, Take 125 mcg by mouth daily., Disp: , Rfl:    diclofenac  (VOLTAREN ) 75 MG EC tablet, Take 1 tablet (75 mg total) by mouth 2 (two) times daily., Disp: 180 tablet, Rfl: 2   DULoxetine (CYMBALTA) 30 MG capsule, Take 1 capsule (30 mg total) by mouth daily., Disp: 90 capsule, Rfl: 1   ferrous sulfate 325 (65 FE) MG tablet, Take 325 mg by mouth daily with breakfast., Disp: , Rfl:    Fish Oil-Cholecalciferol (FISH OIL + D3) 1200-1000 MG-UNIT CAPS, Take 1 tablet by mouth daily., Disp: , Rfl:    Glucosamine-Chondroit-Vit C-Mn (GLUCOSAMINE CHONDR 1500 COMPLX) CAPS, Take 1 capsule by mouth 2 (two) times daily., Disp: , Rfl:    Lifitegrast (XIIDRA) 5 % SOLN, Apply  to eye., Disp: , Rfl:    Multiple Vitamins-Minerals (WOMENS MULTI VITAMIN & MINERAL PO), Take 1 tablet by mouth daily., Disp: , Rfl:    Red Yeast Rice 600 MG CAPS, Take 600 mg by mouth daily. , Disp: , Rfl:    Turmeric 500 MG TABS, Take 500 mg by mouth daily. , Disp: , Rfl:    vitamin B-12 (CYANOCOBALAMIN ) 100 MCG tablet, Take 100 mcg by mouth daily., Disp: , Rfl:    vitamin E 100 UNIT capsule, Take 100 Units by mouth daily., Disp: , Rfl:   Current Facility-Administered Medications:    0.9 %  sodium chloride  infusion, 500 mL, Intravenous, Continuous, Pyrtle, Brittni Hult Bail, MD  Allergies  Allergen Reactions   Pollen Extract Itching    sneezing    Objective:   BP 101/60    Pulse 81   Temp 98.5 F (36.9 C)   Ht 5\' 3"  (1.6 m)   Wt 131 lb 6.4 oz (59.6 kg)   SpO2 99%   BMI 23.28 kg/m      08/17/2023    9:05 AM 05/11/2023   10:27 AM 03/24/2023    9:22 AM  Vitals with BMI  Height 5\' 3"     Weight 131 lbs 6 oz    BMI 23.28    Systolic 101 120 409  Diastolic 60 74 68  Pulse 81 91 80     Physical Exam Vitals and nursing note reviewed.  Constitutional:      Appearance: Normal appearance. She is normal weight.  HENT:     Head: Normocephalic and atraumatic.  Musculoskeletal:     Right hand: Normal.     Left hand: Normal.     Right knee: Normal.     Left knee: Normal.  Skin:    General: Skin is warm and dry.  Neurological:     General: No focal deficit present.     Mental Status: She is alert and oriented to person, place, and time. Mental status is at baseline.  Psychiatric:        Mood and Affect: Mood normal.        Behavior: Behavior normal.        Thought Content: Thought content normal.        Judgment: Judgment normal.     Assessment & Plan:  Arthritis Assessment & Plan: No lab available today however I do believe it is reasonable to check inflammatory markers and evaluate for RA at this point given her joint pain and stiffness. CRP, ESR, anti-CCP, and RF ordered.   Orders: -     C-reactive protein -     Sedimentation rate -     Rheumatoid factor -     Cyclic citrul peptide antibody, IgG  Chronic fatigue Assessment & Plan: Extensive workup done by her PCP. I have no further insights today as to why she is feeling so fatigued. Will start with addressing her chronic pain and lack of energy and motivation due to this. I believe she may benefit from Cymbalta 30mg  daily. Follow up with PCP.   Other orders -     DULoxetine HCl; Take 1 capsule (30 mg total) by mouth daily.  Dispense: 90 capsule; Refill: 1     Follow up plan: Return if symptoms worsen or fail to improve.  Jenelle Mis, FNP

## 2023-08-18 ENCOUNTER — Telehealth: Payer: Self-pay

## 2023-08-18 NOTE — Telephone Encounter (Signed)
 Went into patient's chart to abstract Bone density test, duplicate report. Mjp,lpn

## 2023-08-21 ENCOUNTER — Other Ambulatory Visit: Payer: PPO

## 2023-08-22 ENCOUNTER — Encounter: Payer: Self-pay | Admitting: Family Medicine

## 2023-08-22 ENCOUNTER — Ambulatory Visit (INDEPENDENT_AMBULATORY_CARE_PROVIDER_SITE_OTHER): Admitting: Family Medicine

## 2023-08-22 ENCOUNTER — Other Ambulatory Visit

## 2023-08-22 VITALS — BP 118/60 | HR 75 | Temp 98.5°F | Ht 63.0 in | Wt 134.0 lb

## 2023-08-22 DIAGNOSIS — R5382 Chronic fatigue, unspecified: Secondary | ICD-10-CM | POA: Diagnosis not present

## 2023-08-22 DIAGNOSIS — H1033 Unspecified acute conjunctivitis, bilateral: Secondary | ICD-10-CM | POA: Diagnosis not present

## 2023-08-22 DIAGNOSIS — M1990 Unspecified osteoarthritis, unspecified site: Secondary | ICD-10-CM | POA: Diagnosis not present

## 2023-08-22 DIAGNOSIS — M542 Cervicalgia: Secondary | ICD-10-CM | POA: Diagnosis not present

## 2023-08-22 MED ORDER — MOXIFLOXACIN HCL 0.5 % OP SOLN: 1.0000 [drp] | Freq: Three times a day (TID) | OPHTHALMIC | 1 refills | Status: DC

## 2023-08-22 NOTE — Progress Notes (Unsigned)
 Patient Office Visit  Assessment & Plan:  Acute bacterial conjunctivitis of both eyes -     Moxifloxacin HCl; Place 1 drop into both eyes 3 (three) times daily.  Dispense: 3 mL; Refill: 1   Assessment and Plan    Bilateral Conjunctivitis Acute bilateral conjunctivitis likely bacterial due to morning matting and drainage. No systemic symptoms. Discussed potential duration and hand hygiene. Prefers eye drops. - Prescribe generic antibiotic eye drops. - Advise on hand hygiene. - Provide refill if symptoms persist. - Instruct to monitor symptoms and contact office if no improvement.  Cataracts Diagnosed with cataracts, surgical removal option discussed.  Macular Degeneration Under ophthalmologist monitoring, no treatment or supplementation needed.          No follow-ups on file.   Subjective:     Patient ID: Taylor Taylor, female    DOB: [birth year]  Age: 78 y.o. MRN: [id number]  Chief Complaint  Patient presents with   Eye Drainage    Bilateral eyes. Burning and itchy with drainage x 2 days.     HPI Discussed the use of AI scribe software for clinical note transcription with the patient, who gave verbal consent to proceed.  History of Present Illness        Taylor Taylor "Marily Shows" is a 78 year old female who presents with bilateral eye irritation and discharge.  Her right eye began experiencing irritation on Sunday, followed by the left eye the next morning. Both eyes have drainage and are matted shut in the mornings. She has not been exposed to children or individuals with similar symptoms. No fever, chills, or other systemic symptoms are present.  She has been using Systane eye drops for dry eyes twice daily, but these have not alleviated the morning matting. She recently started taking Cymbalta  (duloxetine ) at a low dose, with no other changes in her medications. No light sensitivity, significant blurry vision aside from tearing, double vision, or eye  pain. She manages the morning matting by wiping her eyes with a washcloth.  She visited an eye doctor the previous Thursday, prior to the onset of symptoms, and was informed about cataracts and macular degeneration, which are being monitored. She takes vitamins regularly but was not advised to take any specific ones for her eye condition. Physical Exam HEENT: Eyes normal, no light sensitivity, no double vision. Results Assessment & Plan Bilateral Conjunctivitis Acute bilateral conjunctivitis likely bacterial due to morning matting and drainage. No systemic symptoms. Discussed potential duration and hand hygiene. Prefers eye drops. - Prescribe generic antibiotic eye drops. - Advise on hand hygiene. - Provide refill if symptoms persist. - Instruct to monitor symptoms and contact office if no improvement.  Cataracts Diagnosed with cataracts, surgical removal option discussed.  Macular Degeneration Under ophthalmologist monitoring, no treatment or supplementation needed.     The 10-year ASCVD risk score (Arnett DK, et al., 2019) is: 17.7%  Past Medical History:  Diagnosis Date   Arthritis    back   Fatigue 11/10/2021   GERD (gastroesophageal reflux disease)    in past   Hypertriglyceridemia without hypercholesterolemia 09/03/2013   Mild cognitive impairment of uncertain or unknown etiology 08/16/2022   Neck pain on right side 12/25/2018   Osteopenia 11/16/2015   h/o bisphos use in records. PCP manages, dexa normal 2020, per pt as of 2022, osteopenia again, advised to f/u with PCP to discussed tx plan   Pain in left hand 05/18/2018   Pain in right elbow 10/15/2020   Pain  in right hand 05/18/2018   Pain in right hip 04/30/2019   Pain in right shoulder 12/11/2018   Precordial chest pain 08/31/2009   Seasonal allergies    Unilateral primary osteoarthritis, right knee 08/24/2016   Viral gastroenteritis 07/13/2022   Past Surgical History:  Procedure Laterality Date   BACK  SURGERY     Scheduled for 08/19/2021., Dr, Alice Innocent.   CERVICAL DISC SURGERY     over 10 years   COLONOSCOPY     Social History   Tobacco Use   Smoking status: Never   Smokeless tobacco: Never  Vaping Use   Vaping status: Never Used  Substance Use Topics   Alcohol use: No   Drug use: No   Family History  Problem Relation Age of Onset   Parkinson's disease Mother    Brain cancer Father    Breast cancer Neg Hx    Allergies  Allergen Reactions   Pollen Extract Itching    sneezing    ROS    Objective:    BP 118/60   Pulse 75   Temp 98.5 F (36.9 C)   Ht 5\' 3"  (1.6 m)   Wt 134 lb (60.8 kg)   SpO2 99%   BMI 23.74 kg/m  BP Readings from Last 3 Encounters:  08/22/23 118/60  08/17/23 101/60  05/11/23 120/74   Wt Readings from Last 3 Encounters:  08/22/23 134 lb (60.8 kg)  08/17/23 131 lb 6.4 oz (59.6 kg)  02/27/23 133 lb (60.3 kg)    Physical Exam Vitals and nursing note reviewed.  Constitutional:      Appearance: Normal appearance.  HENT:     Head: Normocephalic.     Right Ear: Tympanic membrane, ear canal and external ear normal.     Left Ear: Tympanic membrane, ear canal and external ear normal.  Eyes:     Extraocular Movements: Extraocular movements intact.     Conjunctiva/sclera:     Right eye: Exudate present.     Left eye: Exudate present.     Pupils: Pupils are equal, round, and reactive to light.  Cardiovascular:     Rate and Rhythm: Normal rate and regular rhythm.     Heart sounds: Normal heart sounds.  Pulmonary:     Effort: Pulmonary effort is normal.     Breath sounds: Normal breath sounds.  Musculoskeletal:     Right lower leg: No edema.     Left lower leg: No edema.  Neurological:     General: No focal deficit present.     Mental Status: She is alert and oriented to person, place, and time.  Psychiatric:        Mood and Affect: Mood normal.        Behavior: Behavior normal.      No results found for any visits on  08/22/23.  {Labs (Optional):23779}

## 2023-08-23 ENCOUNTER — Encounter: Payer: Self-pay | Admitting: Family Medicine

## 2023-08-24 ENCOUNTER — Other Ambulatory Visit: Payer: Self-pay | Admitting: Family Medicine

## 2023-08-24 DIAGNOSIS — M0580 Other rheumatoid arthritis with rheumatoid factor of unspecified site: Secondary | ICD-10-CM

## 2023-08-24 LAB — RHEUMATOID FACTOR: Rheumatoid fact SerPl-aCnc: 20 [IU]/mL — ABNORMAL HIGH (ref <14)

## 2023-08-24 LAB — SEDIMENTATION RATE: Sed Rate: 2 mm/h (ref 0–30)

## 2023-08-24 LAB — CYCLIC CITRUL PEPTIDE ANTIBODY, IGG: Cyclic Citrullin Peptide Ab: <16 U

## 2023-08-24 LAB — C-REACTIVE PROTEIN: CRP: <3 mg/L (ref <8.0)

## 2023-08-25 NOTE — Telephone Encounter (Signed)
 Pt. Responded to lab results with request for referral to rheumatology Please .  Thank you

## 2023-08-28 ENCOUNTER — Ambulatory Visit: Admitting: Physical Medicine and Rehabilitation

## 2023-08-28 ENCOUNTER — Other Ambulatory Visit: Payer: Self-pay

## 2023-08-28 ENCOUNTER — Encounter: Payer: Self-pay | Admitting: Physical Medicine and Rehabilitation

## 2023-08-28 VITALS — BP 112/63

## 2023-08-28 DIAGNOSIS — M5416 Radiculopathy, lumbar region: Secondary | ICD-10-CM | POA: Diagnosis not present

## 2023-08-28 MED ORDER — METHYLPREDNISOLONE ACETATE 40 MG/ML IJ SUSP
40.0000 mg | Freq: Once | INTRAMUSCULAR | Status: AC
  Administered 2023-08-28: 40 mg

## 2023-08-28 NOTE — Procedures (Signed)
 Lumbar Epidural Steroid Injection - Interlaminar Approach with Fluoroscopic Guidance  [name]      Date of Birth: 23-May-1945 MRN: [id number] PCP: Austine Lefort, MD      Visit Date: 08/28/2023   Universal Protocol:     Consent Given By: the patient  Position: PRONE  Additional Comments: Vital signs were monitored before and after the procedure. Patient was prepped and draped in the usual sterile fashion. The correct patient, procedure, and site was verified.   Injection Procedure Details:   Procedure diagnoses: Lumbar radiculopathy [M54.16]   Meds Administered:  Meds ordered this encounter  Medications   methylPREDNISolone  acetate (DEPO-MEDROL ) injection 40 mg     Laterality: Left  Location/Site:  L2-3  Needle: 3.5 in., 20 ga. Tuohy  Needle Placement: Paramedian epidural  Findings:   -Comments: Excellent flow of contrast into the epidural space.  Procedure Details: Using a paramedian approach from the side mentioned above, the region overlying the inferior lamina was localized under fluoroscopic visualization and the soft tissues overlying this structure were infiltrated with 4 ml. of 1% Lidocaine  without Epinephrine. The Tuohy needle was inserted into the epidural space using a paramedian approach.   The epidural space was localized using loss of resistance along with counter oblique bi-planar fluoroscopic views.  After negative aspirate for air, blood, and CSF, a 2 ml. volume of Isovue -250 was injected into the epidural space and the flow of contrast was observed. Radiographs were obtained for documentation purposes.    The injectate was administered into the level noted above.   Additional Comments:  The patient tolerated the procedure well Dressing: 2 x 2 sterile gauze and Band-Aid    Post-procedure details: Patient was observed during the procedure. Post-procedure instructions were reviewed.  Patient left the clinic in stable  condition.

## 2023-08-28 NOTE — Progress Notes (Signed)
 Patient presents with low back pain that radiates down left leg to the ankle.  She is a side sleeper and it bothers her mostly at night.  Pain is 8/10 at night.  She uses voltaren  gel on her back at night with not much relief.     Pain Scale   Average Pain 8        +Driver, -BT, -Dye Allergies.

## 2023-08-28 NOTE — Progress Notes (Signed)
 Taylor Taylor - 78 y.o. female MRN [id number]  Date of birth: 22-Aug-1945  Office Visit Note: Visit Date: 08/28/2023 PCP: Austine Lefort, MD Referred by: Elna Haggis, MD  Subjective: Chief Complaint  Patient presents with   Lower Back - Pain   HPI:  Taylor Taylor is a 78 y.o. female who comes in today at the request of Dr. Elna Haggis for planned Left L2-3 Lumbar Interlaminar epidural steroid injection with fluoroscopic guidance.  The patient has failed conservative care including home exercise, medications, time and activity modification.  This injection will be diagnostic and hopefully therapeutic.  Please see requesting physician notes for further details and justification.   ROS Otherwise per HPI.  Assessment & Plan: Visit Diagnoses:    ICD-10-CM   1. Lumbar radiculopathy  M54.16 XR C-ARM NO REPORT    Epidural Steroid injection    methylPREDNISolone  acetate (DEPO-MEDROL ) injection 40 mg      Plan: No additional findings.   Meds & Orders:  Meds ordered this encounter  Medications   methylPREDNISolone  acetate (DEPO-MEDROL ) injection 40 mg    Orders Placed This Encounter  Procedures   XR C-ARM NO REPORT   Epidural Steroid injection    Follow-up: Return for visit to requesting provider as needed.   Procedures: No procedures performed  Lumbar Epidural Steroid Injection - Interlaminar Approach with Fluoroscopic Guidance  [name]      Date of Birth: [birth year] MRN: [id number] PCP: Austine Lefort, MD      Visit Date: 08/28/2023   Universal Protocol:     Consent Given By: the patient  Position: PRONE  Additional Comments: Vital signs were monitored before and after the procedure. Patient was prepped and draped in the usual sterile fashion. The correct patient, procedure, and site was verified.   Injection Procedure Details:   Procedure diagnoses: Lumbar radiculopathy [M54.16]   Meds Administered:  Meds ordered this  encounter  Medications   methylPREDNISolone  acetate (DEPO-MEDROL ) injection 40 mg     Laterality: Left  Location/Site:  L2-3  Needle: 3.5 in., 20 ga. Tuohy  Needle Placement: Paramedian epidural  Findings:   -Comments: Excellent flow of contrast into the epidural space.  Procedure Details: Using a paramedian approach from the side mentioned above, the region overlying the inferior lamina was localized under fluoroscopic visualization and the soft tissues overlying this structure were infiltrated with 4 ml. of 1% Lidocaine  without Epinephrine. The Tuohy needle was inserted into the epidural space using a paramedian approach.   The epidural space was localized using loss of resistance along with counter oblique bi-planar fluoroscopic views.  After negative aspirate for air, blood, and CSF, a 2 ml. volume of Isovue -250 was injected into the epidural space and the flow of contrast was observed. Radiographs were obtained for documentation purposes.    The injectate was administered into the level noted above.   Additional Comments:  The patient tolerated the procedure well Dressing: 2 x 2 sterile gauze and Band-Aid    Post-procedure details: Patient was observed during the procedure. Post-procedure instructions were reviewed.  Patient left the clinic in stable condition.   Clinical History: MRI CERVICAL SPINE WITHOUT CONTRAST  TECHNIQUE: Multiplanar, multisequence MR imaging of the cervical spine was performed. No intravenous contrast was administered.  COMPARISON: Cervical spine MRI 08/30/2022.  FINDINGS: Alignment: Stable mild straightening of cervical lordosis. Mild degenerative appearing retrolisthesis of L4 on L5 is stable.  Vertebrae: Stable ACDF hardware at C5-C6. MRI suggest solid arthrodesis at  those levels. Background bone marrow signal is within normal limits. Chronic degenerative marrow endplate changes at C4-C5 and C6-C7 adjacent segments. Faint degenerative  appearing marrow edema now in the left C2 facet (series 5, image 12) with trace degenerative appearing facet joint fluid there and a new posteriorly situated synovial cyst (series 4, image 12) see additional details below.  Cord: Up to mild degenerative cervical spinal cord mass effect but no cord signal abnormality. See details below.  Posterior Fossa, vertebral arteries, paraspinal tissues: Cervicomedullary junction is within normal limits. Grossly stable and negative visible posterior fossa, brain parenchyma.  Preserved major vascular flow voids in the bilateral neck. Left vertebral artery slightly dominant as before. Negative visible neck soft tissues, including adjacent to the left C2-C3 is synovial cyst on series 8, image 10. Negative visible lung apices.  Disc levels:  C2-C3: Moderate facet hypertrophy on the left appears somewhat progressed from last year. Mild degenerative facet joint fluid, mild C2 facet marrow edema and new posteriorly situated synovial cyst (should not cause neural compromise series 5, image 12). But there is moderate to severe left C3 neural foraminal stenosis when combined with asymmetric endplate spurring, and appears increased. Mild contralateral right facet hypertrophy.  C3-C4: Largely negative disc and endplate. Moderate facet hypertrophy greater on the right. No spinal stenosis. Mild to moderate right C4 foraminal stenosis is stable.  C4-C5: Severe disc space loss, circumferential disc osteophyte complex with subtle underlying retrolisthesis. Mild to moderate facet and ligament flavum hypertrophy. Mild spinal stenosis match that mild spinal stenosis (series 8, image 15). Mild if any spinal cord mass effect. Moderate bilateral C5 foraminal stenosis. This level appears stable.  C5-C6: Chronic ACDF.  C6-C7: Moderate disc space loss, circumferential disc osteophyte complex. Moderate facet and moderate to severe ligament flavum hypertrophy.  Mild spinal stenosis and mild spinal cord mass effect best seen on series 4, image 8. mild to moderate bilateral C7 foraminal stenosis. This level appears stable.  C7-T1: Subtle anterolisthesis. Moderate facet and ligament flavum hypertrophy. No convincing stenosis.  Partially visible chronic upper thoracic disc degeneration.  IMPRESSION: 1. Chronic C5-C6 ACDF with stable adjacent segment disease at both C4-C5 and C5-C6 from last year including mild multifactorial spinal stenosis, up to mild spinal cord mass effect, and moderate biforaminal stenosis. No spinal cord signal abnormality.  2. Progression of Left C2-C3 facet arthropathy appears since last year, with new mild facet marrow edema, trace joint fluid, and a new posteriorly situated synovial cyst. Underlying moderate to severe left C3 neural foraminal stenosis also appears progressed.   Electronically Signed By: Marlise Simpers M.D. On: 07/11/2023 10:16 ---- MRI LUMBAR SPINE WITHOUT CONTRAST  IMPRESSION: 1. Mildly progressive lumbar disc and facet degeneration since 2023. 2. Moderate spinal stenosis at L2-3. 3. Mild spinal stenosis and moderate left lateral recess stenosis at L3-4. 4. Moderate bilateral neural foraminal stenosis at L5-S1.     Electronically Signed   By: Aundra Lee M.D.   On: 06/29/2023 14:16     Objective:  VS:  HT:    WT:   BMI:     BP:112/63  HR: bpm  TEMP: ( )  RESP:  Physical Exam Vitals and nursing note reviewed.  Constitutional:      General: She is not in acute distress.    Appearance: Normal appearance. She is not ill-appearing.  HENT:     Head: Normocephalic and atraumatic.     Right Ear: External ear normal.     Left Ear: External ear normal.  Eyes:     Extraocular Movements: Extraocular movements intact.  Cardiovascular:     Rate and Rhythm: Normal rate.     Pulses: Normal pulses.  Pulmonary:     Effort: Pulmonary effort is normal. No respiratory distress.  Abdominal:      General: There is no distension.     Palpations: Abdomen is soft.  Musculoskeletal:        General: Tenderness present.     Cervical back: Neck supple.     Right lower leg: No edema.     Left lower leg: No edema.     Comments: Patient has good distal strength with no pain over the greater trochanters.  No clonus or focal weakness.  Skin:    Findings: No erythema, lesion or rash.  Neurological:     General: No focal deficit present.     Mental Status: She is alert and oriented to person, place, and time.     Sensory: No sensory deficit.     Motor: No weakness or abnormal muscle tone.     Coordination: Coordination normal.  Psychiatric:        Mood and Affect: Mood normal.        Behavior: Behavior normal.      Imaging: No results found.

## 2023-08-28 NOTE — Patient Instructions (Signed)

## 2023-09-08 ENCOUNTER — Other Ambulatory Visit: Payer: Self-pay | Admitting: Family Medicine

## 2023-09-08 DIAGNOSIS — H1033 Unspecified acute conjunctivitis, bilateral: Secondary | ICD-10-CM

## 2023-09-11 ENCOUNTER — Ambulatory Visit: Admitting: Physical Medicine and Rehabilitation

## 2023-09-11 ENCOUNTER — Other Ambulatory Visit: Payer: Self-pay

## 2023-09-11 ENCOUNTER — Telehealth: Payer: Self-pay

## 2023-09-11 VITALS — BP 123/77 | HR 77

## 2023-09-11 DIAGNOSIS — M47812 Spondylosis without myelopathy or radiculopathy, cervical region: Secondary | ICD-10-CM | POA: Diagnosis not present

## 2023-09-11 MED ORDER — METHYLPREDNISOLONE ACETATE 40 MG/ML IJ SUSP
40.0000 mg | Freq: Once | INTRAMUSCULAR | Status: AC
Start: 2023-09-11 — End: 2023-09-11
  Administered 2023-09-11: 40 mg

## 2023-09-11 MED ORDER — ALENDRONATE SODIUM 70 MG PO TABS: ORAL_TABLET | ORAL | 3 refills | Status: AC

## 2023-09-11 NOTE — Telephone Encounter (Signed)
 Prescription Request  09/11/2023  LOV: 08/22/23  What is the name of the medication or equipment? alendronate  (FOSAMAX ) 70 MG tablet [[id number]]   Have you contacted your pharmacy to request a refill? Yes   Which pharmacy would you like this sent to?  [id number] [address], [place] - [address] [place]  [address] Phone: [phone] Fax: [phone]    Patient notified that their request is being sent to the clinical staff for review and that they should receive a response within 2 business days.   Please advise at [facility] [phone]

## 2023-09-11 NOTE — Progress Notes (Unsigned)
 Pain Scale   Average Pain 7 Patient advising her neck pain radiates bilaterally to shoulders and laying down increases her pain. Patient advising that she uses Voltaren  for pain relief.        +Driver, -BT, -Dye Allergies.

## 2023-09-11 NOTE — Patient Instructions (Signed)

## 2023-09-14 NOTE — Procedures (Signed)
 Cervical Facet Joint Intra-Articular Injection with Fluoroscopic Guidance  [name]      Date of Birth: [birth year] MRN: [id number] PCP: Duanne Butler DASEN, MD      Visit Date: 09/11/2023   Universal Protocol:    Date/Time: 06/26/256:50 AM  Consent Given By: the patient  Position: PRONE  Additional Comments: Vital signs were monitored before and after the procedure. Patient was prepped and draped in the usual sterile fashion. The correct patient, procedure, and site was verified.   Injection Procedure Details:  Procedure Site One Meds Administered:  Meds ordered this encounter  Medications   methylPREDNISolone  acetate (DEPO-MEDROL ) injection 40 mg     Laterality: Bilateral  Location/Site:  C3-4  Needle size: 25 G  Needle type: Spinal  Needle Placement: Articular  Findings:  -Contrast Used: 0.5 mL iohexol  180 mg iodine/mL   -Comments: Excellent flow of contrast producing a partial arthrogram.  Procedure Details: The region overlying the facet joints mentioned above were localized under fluoroscopic visualization. The needle was inserted down to the level of the lateral mass of the superior articular process of the facet joint to be injected. Then, the needle was walked off inferiorly into the lateral aspect of the facet joint. Bi-planar images were used for confirming placement and spot radiographs were documented.  A 0.25 ml volume of Omnipaque -240 was injected into the facet joint and a standard partial arthrogram was obtained. Radiographs were obtained of the arthrogram. A 0.5 ml. volume of the steroid/anesthetic solution was injected into the joint. This procedure was repeated for each facet joint injected.   Additional Comments:  No complications occurred Dressing: Band-Aid    Post-procedure details: Patient was observed during the procedure. Post-procedure instructions were reviewed.  Patient left the clinic in stable condition.

## 2023-09-14 NOTE — Progress Notes (Signed)
 Taylor Taylor - 79 y.o. female MRN [id number]  Date of birth: [birth year]  Office Visit Note: Visit Date: 09/11/2023 PCP: Duanne Butler DASEN, MD Referred by: Duanne Butler DASEN, MD  Subjective: Chief Complaint  Patient presents with   Neck - Pain   HPI:  Taylor Taylor is a 78 y.o. female who comes in today at the request of Dr. Victory Gens for planned Bilateral  C3-4 Cervical facet/medial branch block with fluoroscopic guidance.  The patient has failed conservative care including home exercise, medications, time and activity modification.  This injection will be diagnostic and hopefully therapeutic.  Please see requesting physician notes for further details and justification.  Exam has shown concordant pain with facet joint loading.   ROS Otherwise per HPI.  Assessment & Plan: Visit Diagnoses:    ICD-10-CM   1. Cervical spondylosis without myelopathy  M47.812 XR C-ARM NO REPORT    Facet Injection    methylPREDNISolone  acetate (DEPO-MEDROL ) injection 40 mg      Plan: No additional findings.   Meds & Orders:  Meds ordered this encounter  Medications   methylPREDNISolone  acetate (DEPO-MEDROL ) injection 40 mg    Orders Placed This Encounter  Procedures   Facet Injection   XR C-ARM NO REPORT    Follow-up: Return for visit to requesting provider as needed.   Procedures: No procedures performed  Cervical Facet Joint Intra-Articular Injection with Fluoroscopic Guidance  [name]      Date of Birth: [birth year] MRN: [id number] PCP: Duanne Butler DASEN, MD      Visit Date: 09/11/2023   Universal Protocol:    Date/Time: 06/26/256:50 AM  Consent Given By: the patient  Position: PRONE  Additional Comments: Vital signs were monitored before and after the procedure. Patient was prepped and draped in the usual sterile fashion. The correct patient, procedure, and site was verified.   Injection Procedure Details:  Procedure Site One Meds  Administered:  Meds ordered this encounter  Medications   methylPREDNISolone  acetate (DEPO-MEDROL ) injection 40 mg     Laterality: Bilateral  Location/Site:  C3-4  Needle size: 25 G  Needle type: Spinal  Needle Placement: Articular  Findings:  -Contrast Used: 0.5 mL iohexol  180 mg iodine/mL   -Comments: Excellent flow of contrast producing a partial arthrogram.  Procedure Details: The region overlying the facet joints mentioned above were localized under fluoroscopic visualization. The needle was inserted down to the level of the lateral mass of the superior articular process of the facet joint to be injected. Then, the needle was walked off inferiorly into the lateral aspect of the facet joint. Bi-planar images were used for confirming placement and spot radiographs were documented.  A 0.25 ml volume of Omnipaque -240 was injected into the facet joint and a standard partial arthrogram was obtained. Radiographs were obtained of the arthrogram. A 0.5 ml. volume of the steroid/anesthetic solution was injected into the joint. This procedure was repeated for each facet joint injected.   Additional Comments:  No complications occurred Dressing: Band-Aid    Post-procedure details: Patient was observed during the procedure. Post-procedure instructions were reviewed.  Patient left the clinic in stable condition.   Clinical History: MRI CERVICAL SPINE WITHOUT CONTRAST  TECHNIQUE: Multiplanar, multisequence MR imaging of the cervical spine was performed. No intravenous contrast was administered.  COMPARISON: Cervical spine MRI 08/30/2022.  FINDINGS: Alignment: Stable mild straightening of cervical lordosis. Mild degenerative appearing retrolisthesis of L4 on L5 is stable.  Vertebrae: Stable ACDF hardware at C5-C6.  MRI suggest solid arthrodesis at those levels. Background bone marrow signal is within normal limits. Chronic degenerative marrow endplate changes at C4-C5 and  C6-C7 adjacent segments. Faint degenerative appearing marrow edema now in the left C2 facet (series 5, image 12) with trace degenerative appearing facet joint fluid there and a new posteriorly situated synovial cyst (series 4, image 12) see additional details below.  Cord: Up to mild degenerative cervical spinal cord mass effect but no cord signal abnormality. See details below.  Posterior Fossa, vertebral arteries, paraspinal tissues: Cervicomedullary junction is within normal limits. Grossly stable and negative visible posterior fossa, brain parenchyma.  Preserved major vascular flow voids in the bilateral neck. Left vertebral artery slightly dominant as before. Negative visible neck soft tissues, including adjacent to the left C2-C3 is synovial cyst on series 8, image 10. Negative visible lung apices.  Disc levels:  C2-C3: Moderate facet hypertrophy on the left appears somewhat progressed from last year. Mild degenerative facet joint fluid, mild C2 facet marrow edema and new posteriorly situated synovial cyst (should not cause neural compromise series 5, image 12). But there is moderate to severe left C3 neural foraminal stenosis when combined with asymmetric endplate spurring, and appears increased. Mild contralateral right facet hypertrophy.  C3-C4: Largely negative disc and endplate. Moderate facet hypertrophy greater on the right. No spinal stenosis. Mild to moderate right C4 foraminal stenosis is stable.  C4-C5: Severe disc space loss, circumferential disc osteophyte complex with subtle underlying retrolisthesis. Mild to moderate facet and ligament flavum hypertrophy. Mild spinal stenosis match that mild spinal stenosis (series 8, image 15). Mild if any spinal cord mass effect. Moderate bilateral C5 foraminal stenosis. This level appears stable.  C5-C6: Chronic ACDF.  C6-C7: Moderate disc space loss, circumferential disc osteophyte complex. Moderate facet and  moderate to severe ligament flavum hypertrophy. Mild spinal stenosis and mild spinal cord mass effect best seen on series 4, image 8. mild to moderate bilateral C7 foraminal stenosis. This level appears stable.  C7-T1: Subtle anterolisthesis. Moderate facet and ligament flavum hypertrophy. No convincing stenosis.  Partially visible chronic upper thoracic disc degeneration.  IMPRESSION: 1. Chronic C5-C6 ACDF with stable adjacent segment disease at both C4-C5 and C5-C6 from last year including mild multifactorial spinal stenosis, up to mild spinal cord mass effect, and moderate biforaminal stenosis. No spinal cord signal abnormality.  2. Progression of Left C2-C3 facet arthropathy appears since last year, with new mild facet marrow edema, trace joint fluid, and a new posteriorly situated synovial cyst. Underlying moderate to severe left C3 neural foraminal stenosis also appears progressed.   Electronically Signed By: VEAR Hurst M.D. On: 07/11/2023 10:16 ---- MRI LUMBAR SPINE WITHOUT CONTRAST  IMPRESSION: 1. Mildly progressive lumbar disc and facet degeneration since 2023. 2. Moderate spinal stenosis at L2-3. 3. Mild spinal stenosis and moderate left lateral recess stenosis at L3-4. 4. Moderate bilateral neural foraminal stenosis at L5-S1.     Electronically Signed   By: Dasie Hamburg M.D.   On: 06/29/2023 14:16     Objective:  VS:  HT:    WT:   BMI:     BP:123/77  HR:77bpm  TEMP: ( )  RESP:  Physical Exam Vitals and nursing note reviewed.  Constitutional:      General: She is not in acute distress.    Appearance: Normal appearance. She is not ill-appearing.  HENT:     Head: Normocephalic and atraumatic.     Right Ear: External ear normal.     Left Ear:  External ear normal.   Eyes:     Extraocular Movements: Extraocular movements intact.    Cardiovascular:     Rate and Rhythm: Normal rate.     Pulses: Normal pulses.   Musculoskeletal:     Cervical back:  Tenderness present. No rigidity.     Right lower leg: No edema.     Left lower leg: No edema.     Comments: Patient has good strength in the upper extremities including 5 out of 5 strength in wrist extension long finger flexion and APB.  There is no atrophy of the hands intrinsically.  There is a negative Hoffmann's test.   Lymphadenopathy:     Cervical: No cervical adenopathy.   Skin:    Findings: No erythema, lesion or rash.   Neurological:     General: No focal deficit present.     Mental Status: She is alert and oriented to person, place, and time.     Sensory: No sensory deficit.     Motor: No weakness or abnormal muscle tone.     Coordination: Coordination normal.   Psychiatric:        Mood and Affect: Mood normal.        Behavior: Behavior normal.      Imaging: No results found.

## 2023-09-18 DIAGNOSIS — H2513 Age-related nuclear cataract, bilateral: Secondary | ICD-10-CM | POA: Diagnosis not present

## 2023-09-25 ENCOUNTER — Other Ambulatory Visit: Payer: Self-pay | Admitting: Family Medicine

## 2023-09-25 DIAGNOSIS — Z1231 Encounter for screening mammogram for malignant neoplasm of breast: Secondary | ICD-10-CM

## 2023-10-04 DIAGNOSIS — H25811 Combined forms of age-related cataract, right eye: Secondary | ICD-10-CM | POA: Diagnosis not present

## 2023-10-04 DIAGNOSIS — H2511 Age-related nuclear cataract, right eye: Secondary | ICD-10-CM | POA: Diagnosis not present

## 2023-10-12 ENCOUNTER — Ambulatory Visit: Payer: Self-pay

## 2023-10-12 NOTE — Telephone Encounter (Signed)
 FYI Only or Action Required?: FYI only for provider.  Patient was last seen in primary care on 08/22/2023 by Aletha Bene, MD.  Called Nurse Triage reporting Diarrhea.  Symptoms began a week ago.  Interventions attempted: Rest, hydration, or home remedies.  Symptoms are: unchanged.  Triage Disposition: See Physician Within 24 Hours  Patient/caregiver understands and will follow disposition?: Yes Copied from CRM #[id number]. Topic: Clinical - Pink Word Triage >> Oct 12, 2023  1:59 PM Sasha H wrote: Reason for Triage: Pt has been having diarrhea for a week now Reason for Disposition  [1] SEVERE diarrhea (e.g., 7 or more times / day more than normal) AND [2] present > 24 hours (1 day)  Answer Assessment - Initial Assessment Questions 1. DIARRHEA SEVERITY: How bad is the diarrhea? How many more stools have you had in the past 24 hours than normal?      At least 10 in the last 24 hours  2. ONSET: When did the diarrhea begin?      1 week ago  3. STOOL DESCRIPTION:  How loose or watery is the diarrhea? What is the stool color? Is there any blood or mucous in the stool?     Watery, no blood noted. Light brown in color  4. VOMITING: Are you also vomiting? If Yes, ask: How many times in the past 24 hours?      No  5. ABDOMEN PAIN: Are you having any abdomen pain? If Yes, ask: What does it feel like? (e.g., crampy, dull, intermittent, constant)      Intermittent adb pain just before diarrhea episode.   6. ABDOMEN PAIN SEVERITY: If present, ask: How bad is the pain?  (e.g., Scale 1-10; mild, moderate, or severe)    Mild pain  7. ORAL INTAKE: If vomiting, Have you been able to drink liquids? How much liquids have you had in the past 24 hours?     No issues with oral intake  8. HYDRATION: Any signs of dehydration? (e.g., dry mouth [not just dry lips], too weak to stand, dizziness, new weight loss) When did you last urinate?     Does not feel dehydrated. Still  drinking   9. EXPOSURE: Have you traveled to a foreign country recently? Have you been exposed to anyone with diarrhea? Could you have eaten any food that was spoiled?     No exposure to diarrhea that she is aware of  10. ANTIBIOTIC USE: Are you taking antibiotics now or have you taken antibiotics in the past 2 months?       Taking abx eye drops  11. OTHER SYMPTOMS: Do you have any other symptoms? (e.g., fever, blood in stool)       No  12. PREGNANCY: Is there any chance you are pregnant? When was your last menstrual period?       No  Protocols used: Diarrhea-A-AH

## 2023-10-13 ENCOUNTER — Encounter: Payer: Self-pay | Admitting: Family Medicine

## 2023-10-13 ENCOUNTER — Ambulatory Visit: Admitting: Family Medicine

## 2023-10-13 VITALS — BP 110/62 | HR 66 | Temp 98.6°F | Ht 63.0 in | Wt 134.0 lb

## 2023-10-13 DIAGNOSIS — R197 Diarrhea, unspecified: Secondary | ICD-10-CM | POA: Diagnosis not present

## 2023-10-13 NOTE — Progress Notes (Signed)
 Subjective:    Patient ID: Taylor Taylor, female    DOB: 18-Mar-1946, 78 y.o.   MRN: [id number]  Diarrhea   Patient reports 7-day history of diarrhea.  Diarrhea is up to 10 times a day.  It is watery.  She denies any abdominal pain.  She denies any hematochezia.  She denies any melena.  She denies any fevers or chills.  She denies any nausea or vomiting.  She denies any recent travel.  She denies any recent antibiotic use.  She took some Imodium yesterday and the diarrhea has improved today.  She has not had any diarrhea today.  She denies any cramps or muscle twitches.  She denies any syncope or near syncope.  Blood pressure and heart rate are normal today.  Past Medical History:  Diagnosis Date   Arthritis    back   Fatigue 11/10/2021   GERD (gastroesophageal reflux disease)    in past   Hypertriglyceridemia without hypercholesterolemia 09/03/2013   Mild cognitive impairment of uncertain or unknown etiology 08/16/2022   Neck pain on right side 12/25/2018   Osteopenia 11/16/2015   h/o bisphos use in records. PCP manages, dexa normal 2020, per pt as of 2022, osteopenia again, advised to f/u with PCP to discussed tx plan   Pain in left hand 05/18/2018   Pain in right elbow 10/15/2020   Pain in right hand 05/18/2018   Pain in right hip 04/30/2019   Pain in right shoulder 12/11/2018   Precordial chest pain 08/31/2009   Seasonal allergies    Unilateral primary osteoarthritis, right knee 08/24/2016   Viral gastroenteritis 07/13/2022   Past Surgical History:  Procedure Laterality Date   BACK SURGERY     Scheduled for 08/19/2021., Dr, Rondal.   CERVICAL DISC SURGERY     over 10 years   COLONOSCOPY     Current Outpatient Medications on File Prior to Visit  Medication Sig Dispense Refill   acetaminophen  (TYLENOL ) 500 MG tablet Take 500 mg by mouth 2 (two) times daily.     alendronate  (FOSAMAX ) 70 MG tablet TAKE 1 TABLET BY MOUTH ONCE WEEKLY BEFORE THE FIRST FOOD, BEVERAGE OR  MEDICINE OF THE DAY WITH PLAIN WATER 12 tablet 3   Calcium Carbonate-Vit D-Min (CALCIUM 1200 PO) Take 1 tablet by mouth in the morning and at bedtime.     cholecalciferol (VITAMIN D3) 25 MCG (1000 UNIT) tablet Take 125 mcg by mouth daily.     diclofenac  (VOLTAREN ) 75 MG EC tablet Take 1 tablet (75 mg total) by mouth 2 (two) times daily. 180 tablet 2   DULoxetine  (CYMBALTA ) 30 MG capsule Take 1 capsule (30 mg total) by mouth daily. 90 capsule 1   ferrous sulfate 325 (65 FE) MG tablet Take 325 mg by mouth daily with breakfast.     Fish Oil-Cholecalciferol (FISH OIL + D3) 1200-1000 MG-UNIT CAPS Take 1 tablet by mouth daily.     Glucosamine-Chondroit-Vit C-Mn (GLUCOSAMINE CHONDR 1500 COMPLX) CAPS Take 1 capsule by mouth 2 (two) times daily.     Lifitegrast (XIIDRA) 5 % SOLN Apply to eye.     moxifloxacin  (VIGAMOX ) 0.5 % ophthalmic solution INSTILL 1 DROP INTO BOTH EYES 3 TIMES A DAY 3 mL 1   Multiple Vitamins-Minerals (WOMENS MULTI VITAMIN & MINERAL PO) Take 1 tablet by mouth daily.     Red Yeast Rice 600 MG CAPS Take 600 mg by mouth daily.      Turmeric 500 MG TABS Take 500 mg by mouth daily.  vitamin B-12 (CYANOCOBALAMIN ) 100 MCG tablet Take 100 mcg by mouth daily.     vitamin E 100 UNIT capsule Take 100 Units by mouth daily.     Current Facility-Administered Medications on File Prior to Visit  Medication Dose Route Frequency Provider Last Rate Last Admin   0.9 %  sodium chloride  infusion  500 mL Intravenous Continuous Pyrtle, Gordy HERO, MD       Allergies  Allergen Reactions   Pollen Extract Itching    sneezing   Social History   Socioeconomic History   Marital status: Married    Spouse name: Not on file   Number of children: 2   Years of education: 13   Highest education level: Associate degree: occupational, Scientist, product/process development, or vocational program  Occupational History   Occupation: Retired  Tobacco Use   Smoking status: Never   Smokeless tobacco: Never  Vaping Use   Vaping status:  Never Used  Substance and Sexual Activity   Alcohol use: No   Drug use: No   Sexual activity: Not Currently  Other Topics Concern   Not on file  Social History Narrative   Right handed   Drinks coffee   Lives with husband   2 story home   retired   Chief Executive Officer Drivers of Corporate investment banker Strain: Low Risk  (07/20/2023)   Overall Financial Resource Strain (CARDIA)    Difficulty of Paying Living Expenses: Not hard at all  Food Insecurity: No Food Insecurity (07/20/2023)   Hunger Vital Sign    Worried About Running Out of Food in the Last Year: Never true    Ran Out of Food in the Last Year: Never true  Transportation Needs: No Transportation Needs (07/20/2023)   PRAPARE - Administrator, Civil Service (Medical): No    Lack of Transportation (Non-Medical): No  Physical Activity: Sufficiently Active (07/20/2023)   Exercise Vital Sign    Days of Exercise per Week: 4 days    Minutes of Exercise per Session: 40 min  Stress: No Stress Concern Present (07/20/2023)   [employer] of Occupational Health - Occupational Stress Questionnaire    Feeling of Stress : Not at all  Social Connections: Socially Integrated (07/20/2023)   Social Connection and Isolation Panel    Frequency of Communication with Friends and Family: Three times a week    Frequency of Social Gatherings with Friends and Family: More than three times a week    Attends Religious Services: More than 4 times per year    Active Member of [employer] or Organizations: Yes    Attends Engineer, structural: More than 4 times per year    Marital Status: Married  Catering manager Violence: Not At Risk (07/20/2023)   Humiliation, Afraid, Rape, and Kick questionnaire    Fear of Current or Ex-Partner: No    Emotionally Abused: No    Physically Abused: No    Sexually Abused: No    Past Medical History:  Diagnosis Date   Arthritis    back   Fatigue 11/10/2021   GERD (gastroesophageal reflux disease)    in past    Hypertriglyceridemia without hypercholesterolemia 09/03/2013   Mild cognitive impairment of uncertain or unknown etiology 08/16/2022   Neck pain on right side 12/25/2018   Osteopenia 11/16/2015   h/o bisphos use in records. PCP manages, dexa normal 2020, per pt as of 2022, osteopenia again, advised to f/u with PCP to discussed tx plan   Pain in left hand  05/18/2018   Pain in right elbow 10/15/2020   Pain in right hand 05/18/2018   Pain in right hip 04/30/2019   Pain in right shoulder 12/11/2018   Precordial chest pain 08/31/2009   Seasonal allergies    Unilateral primary osteoarthritis, right knee 08/24/2016   Viral gastroenteritis 07/13/2022   Past Surgical History:  Procedure Laterality Date   BACK SURGERY     Scheduled for 08/19/2021., Dr, Rondal.   CERVICAL DISC SURGERY     over 10 years   COLONOSCOPY     Current Outpatient Medications on File Prior to Visit  Medication Sig Dispense Refill   acetaminophen  (TYLENOL ) 500 MG tablet Take 500 mg by mouth 2 (two) times daily.     alendronate  (FOSAMAX ) 70 MG tablet TAKE 1 TABLET BY MOUTH ONCE WEEKLY BEFORE THE FIRST FOOD, BEVERAGE OR MEDICINE OF THE DAY WITH PLAIN WATER 12 tablet 3   Calcium Carbonate-Vit D-Min (CALCIUM 1200 PO) Take 1 tablet by mouth in the morning and at bedtime.     cholecalciferol (VITAMIN D3) 25 MCG (1000 UNIT) tablet Take 125 mcg by mouth daily.     diclofenac  (VOLTAREN ) 75 MG EC tablet Take 1 tablet (75 mg total) by mouth 2 (two) times daily. 180 tablet 2   DULoxetine  (CYMBALTA ) 30 MG capsule Take 1 capsule (30 mg total) by mouth daily. 90 capsule 1   ferrous sulfate 325 (65 FE) MG tablet Take 325 mg by mouth daily with breakfast.     Fish Oil-Cholecalciferol (FISH OIL + D3) 1200-1000 MG-UNIT CAPS Take 1 tablet by mouth daily.     Glucosamine-Chondroit-Vit C-Mn (GLUCOSAMINE CHONDR 1500 COMPLX) CAPS Take 1 capsule by mouth 2 (two) times daily.     Lifitegrast (XIIDRA) 5 % SOLN Apply to eye.     moxifloxacin   (VIGAMOX ) 0.5 % ophthalmic solution INSTILL 1 DROP INTO BOTH EYES 3 TIMES A DAY 3 mL 1   Multiple Vitamins-Minerals (WOMENS MULTI VITAMIN & MINERAL PO) Take 1 tablet by mouth daily.     Red Yeast Rice 600 MG CAPS Take 600 mg by mouth daily.      Turmeric 500 MG TABS Take 500 mg by mouth daily.      vitamin B-12 (CYANOCOBALAMIN ) 100 MCG tablet Take 100 mcg by mouth daily.     vitamin E 100 UNIT capsule Take 100 Units by mouth daily.     Current Facility-Administered Medications on File Prior to Visit  Medication Dose Route Frequency Provider Last Rate Last Admin   0.9 %  sodium chloride  infusion  500 mL Intravenous Continuous Pyrtle, Gordy HERO, MD       Allergies  Allergen Reactions   Pollen Extract Itching    sneezing   Social History   Socioeconomic History   Marital status: Married    Spouse name: Not on file   Number of children: 2   Years of education: 13   Highest education level: Associate degree: occupational, Scientist, product/process development, or vocational program  Occupational History   Occupation: Retired  Tobacco Use   Smoking status: Never   Smokeless tobacco: Never  Vaping Use   Vaping status: Never Used  Substance and Sexual Activity   Alcohol use: No   Drug use: No   Sexual activity: Not Currently  Other Topics Concern   Not on file  Social History Narrative   Right handed   Drinks coffee   Lives with husband   2 story home   retired   Chief Executive Officer Drivers of Health  Financial Resource Strain: Low Risk  (07/20/2023)   Overall Financial Resource Strain (CARDIA)    Difficulty of Paying Living Expenses: Not hard at all  Food Insecurity: No Food Insecurity (07/20/2023)   Hunger Vital Sign    Worried About Running Out of Food in the Last Year: Never true    Ran Out of Food in the Last Year: Never true  Transportation Needs: No Transportation Needs (07/20/2023)   PRAPARE - Administrator, Civil Service (Medical): No    Lack of Transportation (Non-Medical): No  Physical  Activity: Sufficiently Active (07/20/2023)   Exercise Vital Sign    Days of Exercise per Week: 4 days    Minutes of Exercise per Session: 40 min  Stress: No Stress Concern Present (07/20/2023)   [employer] of Occupational Health - Occupational Stress Questionnaire    Feeling of Stress : Not at all  Social Connections: Socially Integrated (07/20/2023)   Social Connection and Isolation Panel    Frequency of Communication with Friends and Family: Three times a week    Frequency of Social Gatherings with Friends and Family: More than three times a week    Attends Religious Services: More than 4 times per year    Active Member of [employer] or Organizations: Yes    Attends Engineer, structural: More than 4 times per year    Marital Status: Married  Catering manager Violence: Not At Risk (07/20/2023)   Humiliation, Afraid, Rape, and Kick questionnaire    Fear of Current or Ex-Partner: No    Emotionally Abused: No    Physically Abused: No    Sexually Abused: No      Review of Systems  Gastrointestinal:  Positive for diarrhea.  All other systems reviewed and are negative.      Objective:   Physical Exam Vitals reviewed.  Constitutional:      General: She is not in acute distress.    Appearance: She is well-developed. She is not diaphoretic.  HENT:     Head: Normocephalic and atraumatic.     Right Ear: External ear normal.     Left Ear: External ear normal.     Nose: Nose normal.     Mouth/Throat:     Pharynx: No oropharyngeal exudate.  Eyes:     Conjunctiva/sclera: Conjunctivae normal.     Pupils: Pupils are equal, round, and reactive to light.  Neck:     Thyroid : No thyromegaly.     Vascular: No JVD.     Trachea: No tracheal deviation.  Cardiovascular:     Rate and Rhythm: Normal rate and regular rhythm.     Heart sounds: Normal heart sounds. No murmur heard.    No friction rub. No gallop.  Pulmonary:     Effort: Pulmonary effort is normal. No respiratory distress.      Breath sounds: Normal breath sounds. No wheezing or rales.  Chest:     Chest wall: No tenderness.  Abdominal:     General: Abdomen is flat. Bowel sounds are increased. There is no distension.     Palpations: Abdomen is soft. There is no mass.     Tenderness: There is no abdominal tenderness. There is no guarding or rebound.  Musculoskeletal:        General: No tenderness. Normal range of motion.     Cervical back: Neck supple.  Lymphadenopathy:     Cervical: No cervical adenopathy.  Skin:    Findings: No rash.  Neurological:  Mental Status: She is alert and oriented to person, place, and time.     Cranial Nerves: No cranial nerve deficit.     Motor: No abnormal muscle tone.     Coordination: Coordination normal.     Deep Tendon Reflexes: Reflexes normal.           Assessment & Plan:  Diarrhea, unspecified type - Plan: CBC with Differential/Platelet, Comprehensive metabolic panel with GFR I suspect the patient has viral gastroenteritis.  Diarrhea has improved.  Recommended avoiding Imodium.  Instead stressed the importance of replacing fluid losses with Gatorade or Pedialyte.  Recommended using fiber and a probiotic to repair the intestinal balance.  Monitor over the weekend.  If not improving by next week I would recommend stool cultures and O&P.  Can use Imodium sparingly for diarrhea but avoid severe constipation.  Hold diclofenac  until diarrhea has improved to avoid renal toxicity.  Check CBC and CMP

## 2023-10-14 LAB — CBC WITH DIFFERENTIAL/PLATELET
Absolute Lymphocytes: 3095 {cells}/uL (ref 850–3900)
Absolute Monocytes: 670 {cells}/uL (ref 200–950)
Basophils Absolute: 39 {cells}/uL (ref 0–200)
Basophils Relative: 0.5 %
Eosinophils Absolute: 400 {cells}/uL (ref 15–500)
Eosinophils Relative: 5.2 %
HCT: 36.6 % (ref 35.0–45.0)
Hemoglobin: 11.8 g/dL (ref 11.7–15.5)
MCH: 30.4 pg (ref 27.0–33.0)
MCHC: 32.2 g/dL (ref 32.0–36.0)
MCV: 94.3 fL (ref 80.0–100.0)
MPV: 9.3 fL (ref 7.5–12.5)
Monocytes Relative: 8.7 %
Neutro Abs: 3496 {cells}/uL (ref 1500–7800)
Neutrophils Relative %: 45.4 %
Platelets: 287 Thousand/uL (ref 140–400)
RBC: 3.88 Million/uL (ref 3.80–5.10)
RDW: 12.9 % (ref 11.0–15.0)
Total Lymphocyte: 40.2 %
WBC: 7.7 Thousand/uL (ref 3.8–10.8)

## 2023-10-14 LAB — COMPREHENSIVE METABOLIC PANEL WITH GFR
AG Ratio: 2.4 (calc) (ref 1.0–2.5)
ALT: 14 U/L (ref 6–29)
AST: 14 U/L (ref 10–35)
Albumin: 4.4 g/dL (ref 3.6–5.1)
Alkaline phosphatase (APISO): 30 U/L — ABNORMAL LOW (ref 37–153)
BUN: 11 mg/dL (ref 7–25)
CO2: 29 mmol/L (ref 20–32)
Calcium: 9.1 mg/dL (ref 8.6–10.4)
Chloride: 97 mmol/L — ABNORMAL LOW (ref 98–110)
Creat: 0.87 mg/dL (ref 0.60–1.00)
Globulin: 1.8 g/dL — ABNORMAL LOW (ref 1.9–3.7)
Glucose, Bld: 85 mg/dL (ref 65–99)
Potassium: 4.8 mmol/L (ref 3.5–5.3)
Sodium: 131 mmol/L — ABNORMAL LOW (ref 135–146)
Total Bilirubin: 0.4 mg/dL (ref 0.2–1.2)
Total Protein: 6.2 g/dL (ref 6.1–8.1)
eGFR: 69 mL/min/1.73m2 (ref >=60)

## 2023-10-16 ENCOUNTER — Encounter: Payer: Self-pay | Admitting: Family Medicine

## 2023-10-16 ENCOUNTER — Ambulatory Visit: Payer: Self-pay | Admitting: Family Medicine

## 2023-10-20 ENCOUNTER — Other Ambulatory Visit: Payer: Self-pay

## 2023-10-20 ENCOUNTER — Other Ambulatory Visit

## 2023-10-20 DIAGNOSIS — R197 Diarrhea, unspecified: Secondary | ICD-10-CM

## 2023-10-23 LAB — C. DIFFICILE GDH AND TOXIN A/B
GDH ANTIGEN: NOT DETECTED
MICRO NUMBER:: 16776189
SPECIMEN QUALITY:: ADEQUATE
TOXIN A AND B: NOT DETECTED

## 2023-10-24 ENCOUNTER — Ambulatory Visit: Payer: Self-pay | Admitting: Family Medicine

## 2023-10-24 NOTE — Progress Notes (Signed)
 Pt informed and verbalized understanding

## 2023-10-25 LAB — SALMONELLA/SHIGELLA CULT, CAMPY EIA AND SHIGA TOXIN RFL ECOLI
MICRO NUMBER:: 16775894
MICRO NUMBER:: 16775895
MICRO NUMBER:: 16775895
SHIGA RESULT:: NOT DETECTED
SPECIMEN QUALITY:: ADEQUATE
SPECIMEN QUALITY:: ADEQUATE
SPECIMEN QUALITY:: ADEQUATE
SS RESULT:: NOT DETECTED

## 2023-11-02 ENCOUNTER — Other Ambulatory Visit: Payer: Self-pay

## 2023-11-02 DIAGNOSIS — R197 Diarrhea, unspecified: Secondary | ICD-10-CM

## 2023-11-02 DIAGNOSIS — R195 Other fecal abnormalities: Secondary | ICD-10-CM

## 2023-11-06 ENCOUNTER — Telehealth: Payer: Self-pay | Admitting: Physical Medicine and Rehabilitation

## 2023-11-06 DIAGNOSIS — M5416 Radiculopathy, lumbar region: Secondary | ICD-10-CM

## 2023-11-06 NOTE — Telephone Encounter (Signed)
 Repeat last injection

## 2023-11-08 ENCOUNTER — Ambulatory Visit

## 2023-11-24 ENCOUNTER — Ambulatory Visit
Admission: RE | Admit: 2023-11-24 | Discharge: 2023-11-24 | Disposition: A | Discharge status: Home / Self Care | Source: Ambulatory Visit | Attending: Family Medicine | Admitting: Family Medicine

## 2023-11-24 DIAGNOSIS — Z1231 Encounter for screening mammogram for malignant neoplasm of breast: Secondary | ICD-10-CM | POA: Diagnosis not present

## 2023-11-30 ENCOUNTER — Other Ambulatory Visit: Payer: Self-pay | Admitting: Family Medicine

## 2023-11-30 NOTE — Telephone Encounter (Unsigned)
 Copied from CRM #[id number]. Topic: Clinical - Medication Refill >> Nov 30, 2023  2:53 PM Donee H wrote: Medication: diclofenac  (VOLTAREN ) 75 MG EC tablet  Has the patient contacted their pharmacy? Yes, and states pharmacy was suppose to send request. No request is showing currently      This is the patient's preferred pharmacy:  [id number] [place], [place] - [address] [place] [address] Phone: [phone] Fax: [phone]  Is this the correct pharmacy for this prescription? Yes  Has the prescription been filled recently? No  Is the patient out of the medication? No, she states has about a week worth of medication left  Has the patient been seen for an appointment in the last year OR does the patient have an upcoming appointment? Yes  Can we respond through MyChart? Yes  Agent: Please be advised that Rx refills may take up to 3 business days. We ask that you follow-up with your pharmacy.

## 2023-12-01 MED ORDER — DICLOFENAC SODIUM 75 MG PO TBEC: 75.0000 mg | DELAYED_RELEASE_TABLET | Freq: Two times a day (BID) | ORAL | 1 refills | Status: AC

## 2023-12-01 NOTE — Telephone Encounter (Signed)
 Requested Prescriptions  Pending Prescriptions Disp Refills   diclofenac  (VOLTAREN ) 75 MG EC tablet 180 tablet 1    Sig: Take 1 tablet (75 mg total) by mouth 2 (two) times daily.     Analgesics:  NSAIDS Failed - 12/01/2023 11:55 AM      Failed - Manual Review: Labs are only required if the patient has taken medication for more than 8 weeks.      Passed - Cr in normal range and within 360 days    Creat  Date Value Ref Range Status  10/13/2023 0.87 0.60 - 1.00 mg/dL Final         Passed - HGB in normal range and within 360 days    Hemoglobin  Date Value Ref Range Status  10/13/2023 11.8 11.7 - 15.5 g/dL Final   Hemoglobin, fingerstick  Date Value Ref Range Status  11/18/2014 12.0 12.0 - 16.0 g/dL Final         Passed - PLT in normal range and within 360 days    Platelets  Date Value Ref Range Status  10/13/2023 287 140 - 400 Thousand/uL Final         Passed - HCT in normal range and within 360 days    HCT  Date Value Ref Range Status  10/13/2023 36.6 35.0 - 45.0 % Final         Passed - eGFR is 30 or above and within 360 days    GFR, Est African American  Date Value Ref Range Status  07/20/2020 83 > OR = 60 mL/min/1.69m2 Final   GFR, Est Non African American  Date Value Ref Range Status  07/20/2020 72 > OR = 60 mL/min/1.58m2 Final   eGFR  Date Value Ref Range Status  10/13/2023 69 > OR = 60 mL/min/1.68m2 Final         Passed - Patient is not pregnant      Passed - Valid encounter within last 12 months    Recent Outpatient Visits           1 month ago Diarrhea, unspecified type   [facility] Duanne Butler DASEN, MD   3 months ago Acute bacterial conjunctivitis of both eyes   [place] [facility] Aletha Bene, MD   3 months ago Arthritis   [place] [facility] Kayla Jeoffrey RAMAN, FNP   9 months ago Occipital headache   [facility] Duanne, Butler DASEN, MD   11  months ago Encounter for [employer] annual wellness exam   [place] [facility] Pickard, Butler DASEN, MD

## 2023-12-04 ENCOUNTER — Ambulatory Visit: Admitting: Physical Medicine and Rehabilitation

## 2023-12-04 ENCOUNTER — Other Ambulatory Visit: Payer: Self-pay

## 2023-12-04 VITALS — BP 113/65 | HR 83

## 2023-12-04 DIAGNOSIS — M5416 Radiculopathy, lumbar region: Secondary | ICD-10-CM

## 2023-12-04 MED ORDER — METHYLPREDNISOLONE ACETATE 40 MG/ML IJ SUSP
40.0000 mg | Freq: Once | INTRAMUSCULAR | Status: AC
  Administered 2023-12-04: 40 mg

## 2023-12-04 NOTE — Progress Notes (Unsigned)
 Pain Scale   Average Pain 5 Patient advising she has lower back pain radiating to left leg, patient advising her pain increases at night and eases when moving.        +Driver, -BT, -Dye Allergies.

## 2023-12-05 NOTE — Progress Notes (Signed)
 Taylor Taylor - 78 y.o. female MRN [id number]  Date of birth: 1945-11-29  Office Visit Note: Visit Date: 12/04/2023 PCP: Duanne Butler DASEN, MD Referred by: Duanne Butler DASEN, MD  Subjective: Chief Complaint  Patient presents with   Lower Back - Pain   HPI:  Taylor Taylor is a 78 y.o. female who comes in today for planned repeat Left L2-3  Lumbar Interlaminar epidural steroid injection with fluoroscopic guidance.  The patient has failed conservative care including home exercise, medications, time and activity modification.  This injection will be diagnostic and hopefully therapeutic.  Please see requesting physician notes for further details and justification. Patient received more than 50% pain relief from prior injection.   Referring: Duwaine Pouch, FNP   ROS Otherwise per HPI.  Assessment & Plan: Visit Diagnoses:    ICD-10-CM   1. Lumbar radiculopathy  M54.16 XR C-ARM NO REPORT    Epidural Steroid injection    methylPREDNISolone  acetate (DEPO-MEDROL ) injection 40 mg      Plan: No additional findings.   Meds & Orders:  Meds ordered this encounter  Medications   methylPREDNISolone  acetate (DEPO-MEDROL ) injection 40 mg    Orders Placed This Encounter  Procedures   XR C-ARM NO REPORT   Epidural Steroid injection    Follow-up: Return for visit to requesting provider as needed.   Procedures: No procedures performed  Lumbar Epidural Steroid Injection - Interlaminar Approach with Fluoroscopic Guidance  [name]      Date of Birth: 1946/01/29 MRN: [id number] PCP: Duanne Butler DASEN, MD      Visit Date: 12/04/2023   Universal Protocol:     Consent Given By: the patient  Position: PRONE  Additional Comments: Vital signs were monitored before and after the procedure. Patient was prepped and draped in the usual sterile fashion. The correct patient, procedure, and site was verified.   Injection Procedure Details:   Procedure  diagnoses: Lumbar radiculopathy [M54.16]   Meds Administered:  Meds ordered this encounter  Medications   methylPREDNISolone  acetate (DEPO-MEDROL ) injection 40 mg     Laterality: Left  Location/Site:  L2-3  Needle: 3.5 in., 20 ga. Tuohy  Needle Placement: Paramedian epidural  Findings:   -Comments: Excellent flow of contrast into the epidural space.  Procedure Details: Using a paramedian approach from the side mentioned above, the region overlying the inferior lamina was localized under fluoroscopic visualization and the soft tissues overlying this structure were infiltrated with 4 ml. of 1% Lidocaine  without Epinephrine. The Tuohy needle was inserted into the epidural space using a paramedian approach.   The epidural space was localized using loss of resistance along with counter oblique bi-planar fluoroscopic views.  After negative aspirate for air, blood, and CSF, a 2 ml. volume of Isovue -250 was injected into the epidural space and the flow of contrast was observed. Radiographs were obtained for documentation purposes.    The injectate was administered into the level noted above.   Additional Comments:  The patient tolerated the procedure well Dressing: 2 x 2 sterile gauze and Band-Aid    Post-procedure details: Patient was observed during the procedure. Post-procedure instructions were reviewed.  Patient left the clinic in stable condition.   Clinical History: MRI CERVICAL SPINE WITHOUT CONTRAST  TECHNIQUE: Multiplanar, multisequence MR imaging of the cervical spine was performed. No intravenous contrast was administered.  COMPARISON: Cervical spine MRI 08/30/2022.  FINDINGS: Alignment: Stable mild straightening of cervical lordosis. Mild degenerative appearing retrolisthesis of L4 on L5 is stable.  Vertebrae: Stable ACDF hardware at C5-C6. MRI suggest solid arthrodesis at those levels. Background bone marrow signal is within normal limits. Chronic  degenerative marrow endplate changes at C4-C5 and C6-C7 adjacent segments. Faint degenerative appearing marrow edema now in the left C2 facet (series 5, image 12) with trace degenerative appearing facet joint fluid there and a new posteriorly situated synovial cyst (series 4, image 12) see additional details below.  Cord: Up to mild degenerative cervical spinal cord mass effect but no cord signal abnormality. See details below.  Posterior Fossa, vertebral arteries, paraspinal tissues: Cervicomedullary junction is within normal limits. Grossly stable and negative visible posterior fossa, brain parenchyma.  Preserved major vascular flow voids in the bilateral neck. Left vertebral artery slightly dominant as before. Negative visible neck soft tissues, including adjacent to the left C2-C3 is synovial cyst on series 8, image 10. Negative visible lung apices.  Disc levels:  C2-C3: Moderate facet hypertrophy on the left appears somewhat progressed from last year. Mild degenerative facet joint fluid, mild C2 facet marrow edema and new posteriorly situated synovial cyst (should not cause neural compromise series 5, image 12). But there is moderate to severe left C3 neural foraminal stenosis when combined with asymmetric endplate spurring, and appears increased. Mild contralateral right facet hypertrophy.  C3-C4: Largely negative disc and endplate. Moderate facet hypertrophy greater on the right. No spinal stenosis. Mild to moderate right C4 foraminal stenosis is stable.  C4-C5: Severe disc space loss, circumferential disc osteophyte complex with subtle underlying retrolisthesis. Mild to moderate facet and ligament flavum hypertrophy. Mild spinal stenosis match that mild spinal stenosis (series 8, image 15). Mild if any spinal cord mass effect. Moderate bilateral C5 foraminal stenosis. This level appears stable.  C5-C6: Chronic ACDF.  C6-C7: Moderate disc space loss, circumferential  disc osteophyte complex. Moderate facet and moderate to severe ligament flavum hypertrophy. Mild spinal stenosis and mild spinal cord mass effect best seen on series 4, image 8. mild to moderate bilateral C7 foraminal stenosis. This level appears stable.  C7-T1: Subtle anterolisthesis. Moderate facet and ligament flavum hypertrophy. No convincing stenosis.  Partially visible chronic upper thoracic disc degeneration.  IMPRESSION: 1. Chronic C5-C6 ACDF with stable adjacent segment disease at both C4-C5 and C5-C6 from last year including mild multifactorial spinal stenosis, up to mild spinal cord mass effect, and moderate biforaminal stenosis. No spinal cord signal abnormality.  2. Progression of Left C2-C3 facet arthropathy appears since last year, with new mild facet marrow edema, trace joint fluid, and a new posteriorly situated synovial cyst. Underlying moderate to severe left C3 neural foraminal stenosis also appears progressed.   Electronically Signed By: VEAR Hurst M.D. On: 07/11/2023 10:16 ---- MRI LUMBAR SPINE WITHOUT CONTRAST  IMPRESSION: 1. Mildly progressive lumbar disc and facet degeneration since 2023. 2. Moderate spinal stenosis at L2-3. 3. Mild spinal stenosis and moderate left lateral recess stenosis at L3-4. 4. Moderate bilateral neural foraminal stenosis at L5-S1.     Electronically Signed   By: Dasie Hamburg M.D.   On: 06/29/2023 14:16     Objective:  VS:  HT:    WT:   BMI:     BP:113/65  HR:83bpm  TEMP: ( )  RESP:  Physical Exam Vitals and nursing note reviewed.  Constitutional:      General: She is not in acute distress.    Appearance: Normal appearance. She is not ill-appearing.  HENT:     Head: Normocephalic and atraumatic.     Right Ear: External ear normal.  Left Ear: External ear normal.  Eyes:     Extraocular Movements: Extraocular movements intact.  Cardiovascular:     Rate and Rhythm: Normal rate.     Pulses: Normal pulses.   Pulmonary:     Effort: Pulmonary effort is normal. No respiratory distress.  Abdominal:     General: There is no distension.     Palpations: Abdomen is soft.  Musculoskeletal:        General: Tenderness present.     Cervical back: Neck supple.     Right lower leg: No edema.     Left lower leg: No edema.     Comments: Patient has good distal strength with no pain over the greater trochanters.  No clonus or focal weakness.  Skin:    Findings: No erythema, lesion or rash.  Neurological:     General: No focal deficit present.     Mental Status: She is alert and oriented to person, place, and time.     Sensory: No sensory deficit.     Motor: No weakness or abnormal muscle tone.     Coordination: Coordination normal.  Psychiatric:        Mood and Affect: Mood normal.        Behavior: Behavior normal.      Imaging: No results found.

## 2023-12-05 NOTE — Procedures (Signed)
 Lumbar Epidural Steroid Injection - Interlaminar Approach with Fluoroscopic Guidance  [name]      Date of Birth: [birth year] MRN: [id number] PCP: Duanne Butler DASEN, MD      Visit Date: 12/04/2023   Universal Protocol:     Consent Given By: the patient  Position: PRONE  Additional Comments: Vital signs were monitored before and after the procedure. Patient was prepped and draped in the usual sterile fashion. The correct patient, procedure, and site was verified.   Injection Procedure Details:   Procedure diagnoses: Lumbar radiculopathy [M54.16]   Meds Administered:  Meds ordered this encounter  Medications   methylPREDNISolone  acetate (DEPO-MEDROL ) injection 40 mg     Laterality: Left  Location/Site:  L2-3  Needle: 3.5 in., 20 ga. Tuohy  Needle Placement: Paramedian epidural  Findings:   -Comments: Excellent flow of contrast into the epidural space.  Procedure Details: Using a paramedian approach from the side mentioned above, the region overlying the inferior lamina was localized under fluoroscopic visualization and the soft tissues overlying this structure were infiltrated with 4 ml. of 1% Lidocaine  without Epinephrine. The Tuohy needle was inserted into the epidural space using a paramedian approach.   The epidural space was localized using loss of resistance along with counter oblique bi-planar fluoroscopic views.  After negative aspirate for air, blood, and CSF, a 2 ml. volume of Isovue -250 was injected into the epidural space and the flow of contrast was observed. Radiographs were obtained for documentation purposes.    The injectate was administered into the level noted above.   Additional Comments:  The patient tolerated the procedure well Dressing: 2 x 2 sterile gauze and Band-Aid    Post-procedure details: Patient was observed during the procedure. Post-procedure instructions were reviewed.  Patient left the clinic in stable  condition.

## 2023-12-16 ENCOUNTER — Encounter: Payer: Self-pay | Admitting: Family Medicine

## 2023-12-28 ENCOUNTER — Other Ambulatory Visit: Payer: Self-pay | Admitting: Family Medicine

## 2023-12-28 ENCOUNTER — Other Ambulatory Visit: Payer: Self-pay

## 2023-12-28 ENCOUNTER — Telehealth: Payer: Self-pay

## 2023-12-28 DIAGNOSIS — R197 Diarrhea, unspecified: Secondary | ICD-10-CM

## 2023-12-28 DIAGNOSIS — R195 Other fecal abnormalities: Secondary | ICD-10-CM

## 2023-12-28 MED ORDER — DIPHENOXYLATE-ATROPINE 2.5-0.025 MG PO TABS: 1.0000 | ORAL_TABLET | Freq: Four times a day (QID) | ORAL | Status: DC | PRN

## 2023-12-28 MED ORDER — DIPHENOXYLATE-ATROPINE 2.5-0.025 MG PO TABS: 1.0000 | ORAL_TABLET | Freq: Four times a day (QID) | ORAL | 0 refills | Status: DC | PRN

## 2023-12-28 NOTE — Telephone Encounter (Signed)
 Copied from CRM [id number]. Topic: Clinical - Prescription Issue >> Dec 28, 2023 11:01 AM Ivette P wrote: Reason for CRM: pt called in about medication diphenoxylate-atropine (LOMOTIL) 2.5-0.025 MG tablet [facility] [address] [facility]  [facility]  [facility]  Address: [address] [place], [place] [address] Phone: [phone]

## 2023-12-28 NOTE — Telephone Encounter (Unsigned)
 Copied from CRM [id number]. Topic: Clinical - Prescription Issue >> Dec 28, 2023 12:49 PM Mia F wrote: Pt is calling back to check the status of the change of pharmacy for her rx. Please contact pt when sent

## 2023-12-29 ENCOUNTER — Other Ambulatory Visit: Payer: Self-pay | Admitting: Family Medicine

## 2023-12-29 DIAGNOSIS — R195 Other fecal abnormalities: Secondary | ICD-10-CM

## 2023-12-29 DIAGNOSIS — R197 Diarrhea, unspecified: Secondary | ICD-10-CM

## 2023-12-29 MED ORDER — DIPHENOXYLATE-ATROPINE 2.5-0.025 MG PO TABS
1.0000 | ORAL_TABLET | Freq: Four times a day (QID) | ORAL | 0 refills | Status: DC | PRN
Start: 2023-12-29 — End: 2024-02-05

## 2024-01-02 ENCOUNTER — Ambulatory Visit: Admitting: Physician Assistant

## 2024-01-10 ENCOUNTER — Encounter: Payer: Self-pay | Admitting: Family Medicine

## 2024-01-10 ENCOUNTER — Other Ambulatory Visit: Payer: Self-pay

## 2024-01-10 DIAGNOSIS — Z01419 Encounter for gynecological examination (general) (routine) without abnormal findings: Secondary | ICD-10-CM | POA: Diagnosis not present

## 2024-01-10 DIAGNOSIS — M810 Age-related osteoporosis without current pathological fracture: Secondary | ICD-10-CM

## 2024-01-12 ENCOUNTER — Encounter: Payer: Self-pay | Admitting: Orthopedic Surgery

## 2024-01-12 ENCOUNTER — Ambulatory Visit: Admitting: Orthopedic Surgery

## 2024-01-12 VITALS — BP 130/80 | HR 73 | Ht 63.0 in | Wt 130.0 lb

## 2024-01-12 DIAGNOSIS — M7062 Trochanteric bursitis, left hip: Secondary | ICD-10-CM

## 2024-01-12 DIAGNOSIS — I739 Peripheral vascular disease, unspecified: Secondary | ICD-10-CM | POA: Insufficient documentation

## 2024-01-12 MED ORDER — METHYLPREDNISOLONE ACETATE 40 MG/ML IJ SUSP
40.0000 mg | Freq: Once | INTRAMUSCULAR | Status: AC
  Administered 2024-01-12: 40 mg via INTRA_ARTICULAR

## 2024-01-12 NOTE — Progress Notes (Signed)
 Office Visit Note   [name]           Date of Birth: 11-11-1945           MRN: [id number] Visit Date: 01/12/2024 Requested by: Duanne Butler DASEN, MD [address] [place],  [place] [address] PCP: Duanne Butler DASEN, MD   Assessment & Plan:   Encounter Diagnosis  Name Primary?   Greater trochanteric bursitis of left hip Yes    Meds ordered this encounter  Medications   methylPREDNISolone  acetate (DEPO-MEDROL ) injection 82 mg    78 year old female who receives spinal injections for radicular pain in her left leg came in today to get an injection in her left thigh/hip area.  We diagnosed her with greater trochanteric bursitis and gave her the injection  It is unclear whether this pain is from her lumbar radiculopathy or from hip bursitis she says the pain is different so hopefully this will give her some relief    Procedure note injection for left hip bursitis  Verbal consent was obtained for injection of the  left hip   Timeout was completed to confirm the injection site  The medications used were 40 mg of Depo-Medrol  and 1% lidocaine  3 cc  Anesthesia was provided by ethyl chloride and the skin was prepped with alcohol.  After cleaning the skin with alcohol a 25-gauge needle was used to inject the left hip greater trochanteric bursa    Subjective: Chief Complaint  Patient presents with   Hip Pain    Left thigh painful to raise leg/ esp going up stair for about 3 weeks, Dr Anderson has injected in the past     HPI: 78 year old female left thigh pain and pain over the left greater trochanter especially going up and down the steps.  She has gotten various injections from Dr. Anderson in the past and requests possible injection in the left thigh area.  However, she is new in terms of seeing me and I will have to do a new patient history and physical              ROS: Lower back pain   Images personally read and my interpretation : No  imaging  Visit Diagnoses:  1. Greater trochanteric bursitis of left hip      Follow-Up Instructions: Return if symptoms worsen or fail to improve.    Objective: Vital Signs: BP 130/80   Pulse 73   Ht 5' 3 (1.6 m)   Wt 130 lb (59 kg)   BMI 23.03 kg/m   Physical Exam Vitals and nursing note reviewed.  Constitutional:      Appearance: Normal appearance.  HENT:     Head: Normocephalic and atraumatic.  Eyes:     General: No scleral icterus.       Right eye: No discharge.        Left eye: No discharge.     Extraocular Movements: Extraocular movements intact.     Conjunctiva/sclera: Conjunctivae normal.     Pupils: Pupils are equal, round, and reactive to light.  Cardiovascular:     Rate and Rhythm: Normal rate.     Pulses: Normal pulses.  Skin:    General: Skin is warm and dry.     Capillary Refill: Capillary refill takes less than 2 seconds.  Neurological:     General: No focal deficit present.     Mental Status: She is alert and oriented to person, place, and time.  Psychiatric:  Mood and Affect: Mood normal.        Behavior: Behavior normal.        Thought Content: Thought content normal.        Judgment: Judgment normal.      Ortho Exam  Examination of the left hip and thigh revealed tenderness over the left greater trochanter with normal hip flexion.  There is an area from the trochanter to the upper thigh that has tenderness along the lateral side there is no tenderness in her lower back today.`  Specialty Comments:  MRI CERVICAL SPINE WITHOUT CONTRAST  TECHNIQUE: Multiplanar, multisequence MR imaging of the cervical spine was performed. No intravenous contrast was administered.  COMPARISON: Cervical spine MRI 08/30/2022.  FINDINGS: Alignment: Stable mild straightening of cervical lordosis. Mild degenerative appearing retrolisthesis of L4 on L5 is stable.  Vertebrae: Stable ACDF hardware at C5-C6. MRI suggest solid arthrodesis at those levels.  Background bone marrow signal is within normal limits. Chronic degenerative marrow endplate changes at C4-C5 and C6-C7 adjacent segments. Faint degenerative appearing marrow edema now in the left C2 facet (series 5, image 12) with trace degenerative appearing facet joint fluid there and a new posteriorly situated synovial cyst (series 4, image 12) see additional details below.  Cord: Up to mild degenerative cervical spinal cord mass effect but no cord signal abnormality. See details below.  Posterior Fossa, vertebral arteries, paraspinal tissues: Cervicomedullary junction is within normal limits. Grossly stable and negative visible posterior fossa, brain parenchyma.  Preserved major vascular flow voids in the bilateral neck. Left vertebral artery slightly dominant as before. Negative visible neck soft tissues, including adjacent to the left C2-C3 is synovial cyst on series 8, image 10. Negative visible lung apices.  Disc levels:  C2-C3: Moderate facet hypertrophy on the left appears somewhat progressed from last year. Mild degenerative facet joint fluid, mild C2 facet marrow edema and new posteriorly situated synovial cyst (should not cause neural compromise series 5, image 12). But there is moderate to severe left C3 neural foraminal stenosis when combined with asymmetric endplate spurring, and appears increased. Mild contralateral right facet hypertrophy.  C3-C4: Largely negative disc and endplate. Moderate facet hypertrophy greater on the right. No spinal stenosis. Mild to moderate right C4 foraminal stenosis is stable.  C4-C5: Severe disc space loss, circumferential disc osteophyte complex with subtle underlying retrolisthesis. Mild to moderate facet and ligament flavum hypertrophy. Mild spinal stenosis match that mild spinal stenosis (series 8, image 15). Mild if any spinal cord mass effect. Moderate bilateral C5 foraminal stenosis. This level appears stable.  C5-C6:  Chronic ACDF.  C6-C7: Moderate disc space loss, circumferential disc osteophyte complex. Moderate facet and moderate to severe ligament flavum hypertrophy. Mild spinal stenosis and mild spinal cord mass effect best seen on series 4, image 8. mild to moderate bilateral C7 foraminal stenosis. This level appears stable.  C7-T1: Subtle anterolisthesis. Moderate facet and ligament flavum hypertrophy. No convincing stenosis.  Partially visible chronic upper thoracic disc degeneration.  IMPRESSION: 1. Chronic C5-C6 ACDF with stable adjacent segment disease at both C4-C5 and C5-C6 from last year including mild multifactorial spinal stenosis, up to mild spinal cord mass effect, and moderate biforaminal stenosis. No spinal cord signal abnormality.  2. Progression of Left C2-C3 facet arthropathy appears since last year, with new mild facet marrow edema, trace joint fluid, and a new posteriorly situated synovial cyst. Underlying moderate to severe left C3 neural foraminal stenosis also appears progressed.   Electronically Signed By: VEAR Hurst M.D. On: 07/11/2023  10:16 ---- MRI LUMBAR SPINE WITHOUT CONTRAST  IMPRESSION: 1. Mildly progressive lumbar disc and facet degeneration since 2023. 2. Moderate spinal stenosis at L2-3. 3. Mild spinal stenosis and moderate left lateral recess stenosis at L3-4. 4. Moderate bilateral neural foraminal stenosis at L5-S1.     Electronically Signed   By: Dasie Hamburg M.D.   On: 06/29/2023 14:16  Imaging: No results found.   PMFS History: Patient Active Problem List   Diagnosis Date Noted   PVD (peripheral vascular disease) 01/12/2024   Arthritis 08/16/2022   Seasonal allergies 08/16/2022   Viral gastroenteritis 07/13/2022   Fatigue 11/10/2021   Unilateral primary osteoarthritis, right knee 08/24/2016   Osteopenia 11/16/2015   Hypertriglyceridemia without hypercholesterolemia 09/03/2013   Past Medical History:  Diagnosis Date   Arthritis     back   Fatigue 11/10/2021   GERD (gastroesophageal reflux disease)    in past   Hypertriglyceridemia without hypercholesterolemia 09/03/2013   Mild cognitive impairment of uncertain or unknown etiology 08/16/2022   Neck pain on right side 12/25/2018   Osteopenia 11/16/2015   h/o bisphos use in records. PCP manages, dexa normal 2020, per pt as of 2022, osteopenia again, advised to f/u with PCP to discussed tx plan   Pain in left hand 05/18/2018   Pain in right elbow 10/15/2020   Pain in right hand 05/18/2018   Pain in right hip 04/30/2019   Pain in right shoulder 12/11/2018   Precordial chest pain 08/31/2009   Seasonal allergies    Unilateral primary osteoarthritis, right knee 08/24/2016   Viral gastroenteritis 07/13/2022    Family History  Problem Relation Age of Onset   Parkinson's disease Mother    Brain cancer Father    Breast cancer Neg Hx     Past Surgical History:  Procedure Laterality Date   BACK SURGERY     Scheduled for 08/19/2021., Dr, Rondal.   CERVICAL DISC SURGERY     over 10 years   COLONOSCOPY     Social History   Occupational History   Occupation: Retired  Tobacco Use   Smoking status: Never   Smokeless tobacco: Never  Vaping Use   Vaping status: Never Used  Substance and Sexual Activity   Alcohol use: No   Drug use: No   Sexual activity: Not Currently

## 2024-01-12 NOTE — Progress Notes (Signed)
  Intake history:  Chief Complaint  Patient presents with   Hip Pain    Left thigh painful to raise leg/ esp going up stair for about 3 weeks, Dr Anderson has injected in the past      BP 130/80   Pulse 73   Ht 5' 3 (1.6 m)   Wt 130 lb (59 kg)   BMI 23.03 kg/m  Body mass index is 23.03 kg/m.  Pharmacy? ____[facility]__________________________________  WHAT ARE WE SEEING YOU FOR TODAY?   Left thigh  How long has this bothered you? (DOI?DOS?WS?)  3 weeks   Was there an injury? No  Anticoag.  No  Diabetes No  Heart disease No  Hypertension No  SMOKING HX No  Kidney disease No  Any ALLERGIES ___________ Allergies  Allergen Reactions   Pollen Extract Itching    sneezing   ___________________________________   Treatment:  Have you taken:  Tylenol  Yes  Advil Yes  Had PT No  Had injection No  Other  _________injection________________

## 2024-01-21 ENCOUNTER — Other Ambulatory Visit: Payer: Self-pay | Admitting: Family Medicine

## 2024-01-22 ENCOUNTER — Encounter: Payer: Self-pay | Admitting: Radiology

## 2024-01-31 ENCOUNTER — Encounter: Admitting: Internal Medicine

## 2024-02-05 ENCOUNTER — Other Ambulatory Visit: Payer: Self-pay | Admitting: Family Medicine

## 2024-02-05 DIAGNOSIS — R197 Diarrhea, unspecified: Secondary | ICD-10-CM

## 2024-02-05 DIAGNOSIS — R195 Other fecal abnormalities: Secondary | ICD-10-CM

## 2024-02-05 MED ORDER — DIPHENOXYLATE-ATROPINE 2.5-0.025 MG PO TABS: 1.0000 | ORAL_TABLET | Freq: Four times a day (QID) | ORAL | 0 refills | Status: DC | PRN

## 2024-02-12 NOTE — Progress Notes (Signed)
 "     Taylor Console, PA-C [address] [place], [place]  [address] Phone: [phone]   Gastroenterology Consultation  Referring Provider:     Duanne Butler DASEN, MD Primary Care Physician:  Duanne Butler DASEN, MD Primary Gastroenterologist:  Taylor Console, PA-C / Dr. Gordy Starch  Reason for Consultation:     Diarrhea for 6 months        HPI:   Discussed the use of AI scribe software for clinical note transcription with the patient, who gave verbal consent to proceed. History of Present Illness Taylor Taylor is a 78 year old female who presents with chronic diarrhea. She was referred by her primary care physician for evaluation of her chronic diarrhea.  She has been experiencing episodes of diarrhea for the past six months, characterized by sudden onset and urgency, requiring immediate access to a bathroom. The stools are watery, occurring four to five times a day, and are often triggered after eating, such as after consuming homemade tomato soup. The diarrhea can occur at any time, including at night.  There is no significant weight loss, and she denies recent travel, changes in diet, or new medications in the past six to twelve months. She occasionally notices a small amount of blood when wiping but does not report consistent blood in her stool. She does not experience regular abdominal pain, though she sometimes feels discomfort when having diarrhea.  She has been taking Lomotil , prescribed by her primary care physician, at a dose of one pill per day, which she finds effective in controlling her symptoms when necessary. However, she prefers not to take it when at home, allowing the diarrhea to get out of her system.  A stool test 10/2023 conducted by her PCP was negative for C. difficile, Salmonella, and Shigella.  Stool culture negative.  Blood work from July showed normal kidney function, slightly low sodium and chloride levels, normal liver tests, a white blood cell count  of 7.7, and a hemoglobin level of 11.8. Her CRP test was normal.  05/2016 last colonoscopy by Dr. Starch: Diverticulosis, small internal hemorrhoids, otherwise normal with no polyps.  Mild diffuse melanosis coli.  No repeat due to advanced age.   2008 normal colonoscopy.  Past Medical History:  Diagnosis Date   Arthritis    back   Fatigue 11/10/2021   GERD (gastroesophageal reflux disease)    in past   Hypertriglyceridemia without hypercholesterolemia 09/03/2013   Mild cognitive impairment of uncertain or unknown etiology 08/16/2022   Neck pain on right side 12/25/2018   Osteopenia 11/16/2015   h/o bisphos use in records. PCP manages, dexa normal 2020, per pt as of 2022, osteopenia again, advised to f/u with PCP to discussed tx plan   Pain in left hand 05/18/2018   Pain in right elbow 10/15/2020   Pain in right hand 05/18/2018   Pain in right hip 04/30/2019   Pain in right shoulder 12/11/2018   Precordial chest pain 08/31/2009   Seasonal allergies    Unilateral primary osteoarthritis, right knee 08/24/2016   Viral gastroenteritis 07/13/2022    Past Surgical History:  Procedure Laterality Date   BACK SURGERY     Scheduled for 08/19/2021., Dr, Rondal.   CERVICAL DISC SURGERY     over 10 years   COLONOSCOPY      Prior to Admission medications   Medication Sig Start Date End Date Taking? Authorizing Provider  acetaminophen  (TYLENOL ) 500 MG tablet Take 500 mg by mouth 2 (  two) times daily.    [provider]  alendronate  (FOSAMAX ) 70 MG tablet TAKE 1 TABLET BY MOUTH ONCE WEEKLY BEFORE THE FIRST FOOD, BEVERAGE OR MEDICINE OF THE DAY WITH PLAIN WATER 09/11/23   Duanne Butler DASEN, MD  Calcium Carbonate-Vit D-Min (CALCIUM 1200 PO) Take 1 tablet by mouth in the morning and at bedtime.    [provider]  cholecalciferol (VITAMIN D3) 25 MCG (1000 UNIT) tablet Take 125 mcg by mouth daily.    [provider]  diclofenac  (VOLTAREN ) 75 MG EC tablet Take 1 tablet  (75 mg total) by mouth 2 (two) times daily. 12/01/23   Duanne Butler DASEN, MD  diphenoxylate -atropine  (LOMOTIL ) 2.5-0.025 MG tablet Take 1 tablet by mouth 4 (four) times daily as needed for diarrhea or loose stools. 02/05/24   Duanne Butler DASEN, MD  DULoxetine  (CYMBALTA ) 30 MG capsule TAKE 1 CAPSULE BY MOUTH EVERY DAY 01/22/24   Kayla Jeoffrey RAMAN, FNP  ferrous sulfate 325 (65 FE) MG tablet Take 325 mg by mouth daily with breakfast.    [provider]  Fish Oil-Cholecalciferol (FISH OIL + D3) 1200-1000 MG-UNIT CAPS Take 1 tablet by mouth daily.    [provider]  Glucosamine-Chondroit-Vit C-Mn (GLUCOSAMINE CHONDR 1500 COMPLX) CAPS Take 1 capsule by mouth 2 (two) times daily.    [provider]  Lifitegrast CARON) 5 % SOLN Apply to eye.    [provider]  moxifloxacin  (VIGAMOX ) 0.5 % ophthalmic solution INSTILL 1 DROP INTO BOTH EYES 3 TIMES A DAY 09/11/23   Aletha Bene, MD  Multiple Vitamins-Minerals (WOMENS MULTI VITAMIN & MINERAL PO) Take 1 tablet by mouth daily.    [provider]  Red Yeast Rice 600 MG CAPS Take 600 mg by mouth daily.     [provider]  Turmeric 500 MG TABS Take 500 mg by mouth daily.     [provider]  vitamin B-12 (CYANOCOBALAMIN ) 100 MCG tablet Take 100 mcg by mouth daily.    [provider]  vitamin E 100 UNIT capsule Take 100 Units by mouth daily.    [provider]    Family History  Problem Relation Age of Onset   Parkinson's disease Mother    Brain cancer Father    Breast cancer Neg Hx      Social History   Tobacco Use   Smoking status: Never   Smokeless tobacco: Never  Vaping Use   Vaping status: Never Used  Substance Use Topics   Alcohol use: No   Drug use: No    Allergies as of 02/13/2024 - Review Complete 02/13/2024  Allergen Reaction Noted   Pollen extract Itching     Review of Systems:    All systems reviewed and negative except where noted in HPI.    Physical Exam:  BP 106/60   Pulse 82   Ht 5' 3 (1.6 m)   Wt 130 lb 8 oz (59.2 kg)   BMI 23.12 kg/m  No LMP recorded. Patient is postmenopausal.  General:   Alert,  Well-developed, well-nourished, pleasant and cooperative in NAD Lungs:  Respirations even and unlabored.  Clear throughout to auscultation.   No wheezes, crackles, or rhonchi. No acute distress. Heart:  Regular rate and rhythm; no murmurs, clicks, rubs, or gallops. Abdomen:  Normal bowel sounds.  No bruits.  Soft, and non-distended without masses, hepatosplenomegaly or hernias noted.  No Tenderness.  No guarding or rebound tenderness.    Neurologic:  Alert and oriented x3;  grossly normal neurologically. Psych:  Alert and cooperative. Normal mood and affect.   Imaging Studies: No results found.  Labs: CBC    Component Value Date/Time   WBC 7.7 10/13/2023 1621   RBC 3.88 10/13/2023 1621   HGB 11.8 10/13/2023 1621   HGB 12.0 11/18/2014 1117   HCT 36.6 10/13/2023 1621   PLT 287 10/13/2023 1621   MCV 94.3 10/13/2023 1621    CMP     Component Value Date/Time   NA 131 (L) 10/13/2023 1621   K 4.8 10/13/2023 1621   CL 97 (L) 10/13/2023 1621   CO2 29 10/13/2023 1621   GLUCOSE 85 10/13/2023 1621   BUN 11 10/13/2023 1621   CREATININE 0.87 10/13/2023 1621   CALCIUM 9.1 10/13/2023 1621   PROT 6.2 10/13/2023 1621   ALBUMIN 4.2 11/12/2015 0902   AST 14 10/13/2023 1621   ALT 14 10/13/2023 1621   ALKPHOS 29 (L) 11/12/2015 0902   BILITOT 0.4 10/13/2023 1621   GFRNONAA 72 07/20/2020 0821   GFRAA 83 07/20/2020 0821    Assessment and Plan:   Taylor Taylor is a 78 y.o. y/o female has been referred for: Assessment & Plan 1.  Chronic diarrhea x 6 months, etiology under evaluation Chronic diarrhea for six months with watery stools, primarily in the morning. No significant abdominal pain or weight loss. Stool tests negative for C. diff, Salmonella, and Shigella. Normal CRP.  I am suspicious for microscopic colitis.   IBD is less likely.  IBS is also in the differential. -Repeat labs: CBC, CMP. - Scheduled colonoscopy with Dr. Albertus for evaluation and biopsies. - Continue Lomotil  until one week before colonoscopy.  Stop Lomotil  1 week prior to colonoscopy to ensure adequate prep. - Ensure adequate hydration with fluids containing electrolytes, aiming for 64 ounces daily. - Scheduling Colonoscopy I discussed risks of colonoscopy with patient to include risk of bleeding, colon perforation, and risk of sedation.  Patient expressed understanding and agrees to proceed with colonoscopy.   2.  Suspected microscopic colitis Microscopic colitis suspected as cause of chronic diarrhea. Colonoscopy with biopsies planned for confirmation.  - Scheduled colonoscopy with Dr. Albertus to confirm diagnosis. - Consider treatment with budesonide and Lomotil  if confirmed.  3.  Electrolyte imbalance and risk of dehydration Risk of dehydration due to chronic diarrhea. Previous blood work showed slightly low sodium and chloride. - Ordered blood work to recheck electrolytes and kidney function. - Encouraged intake of fluids with electrolytes including Gatorade, Powerade, and Pedialyte.  4.  Intermittent rectal bleeding: Mild intermittent bright red blood on tissue. Occasional rectal bleeding noted when wiping, not significant.  Suspect hemorrhoidal bleeding.  Ordering colonoscopy to rule out IBD.   Follow up 4 after colonoscopy with TG.  Taylor Console, PA-C   "

## 2024-02-13 ENCOUNTER — Encounter: Payer: Self-pay | Admitting: Physician Assistant

## 2024-02-13 ENCOUNTER — Ambulatory Visit: Admitting: Physician Assistant

## 2024-02-13 ENCOUNTER — Other Ambulatory Visit

## 2024-02-13 ENCOUNTER — Other Ambulatory Visit: Payer: Self-pay | Admitting: Physical Medicine and Rehabilitation

## 2024-02-13 VITALS — BP 106/60 | HR 82 | Ht 63.0 in | Wt 130.5 lb

## 2024-02-13 DIAGNOSIS — R197 Diarrhea, unspecified: Secondary | ICD-10-CM

## 2024-02-13 DIAGNOSIS — M5416 Radiculopathy, lumbar region: Secondary | ICD-10-CM

## 2024-02-13 LAB — COMPREHENSIVE METABOLIC PANEL WITH GFR
ALT: 17 U/L (ref 0–35)
AST: 16 U/L (ref 0–37)
Albumin: 4.5 g/dL (ref 3.5–5.2)
Alkaline Phosphatase: 42 U/L (ref 39–117)
BUN: 28 mg/dL — ABNORMAL HIGH (ref 6–23)
CO2: 33 meq/L — ABNORMAL HIGH (ref 19–32)
Calcium: 9.8 mg/dL (ref 8.4–10.5)
Chloride: 104 meq/L (ref 96–112)
Creatinine, Ser: 0.89 mg/dL (ref 0.40–1.20)
GFR: 62.41 mL/min (ref >60.00)
Glucose, Bld: 91 mg/dL (ref 70–99)
Potassium: 5 meq/L (ref 3.5–5.1)
Sodium: 142 meq/L (ref 135–145)
Total Bilirubin: 0.4 mg/dL (ref 0.2–1.2)
Total Protein: 7.1 g/dL (ref 6.0–8.3)

## 2024-02-13 LAB — CBC WITH DIFFERENTIAL/PLATELET
Basophils Absolute: 0 K/uL (ref 0.0–0.1)
Basophils Relative: 0.9 % (ref 0.0–3.0)
Eosinophils Absolute: 0.4 K/uL (ref 0.0–0.7)
Eosinophils Relative: 7.1 % — ABNORMAL HIGH (ref 0.0–5.0)
HCT: 39 % (ref 36.0–46.0)
Hemoglobin: 13.1 g/dL (ref 12.0–15.0)
Lymphocytes Relative: 36.8 % (ref 12.0–46.0)
Lymphs Abs: 2 K/uL (ref 0.7–4.0)
MCHC: 33.7 g/dL (ref 30.0–36.0)
MCV: 92.3 fl (ref 78.0–100.0)
Monocytes Absolute: 0.5 K/uL (ref 0.1–1.0)
Monocytes Relative: 9.8 % (ref 3.0–12.0)
Neutro Abs: 2.4 K/uL (ref 1.4–7.7)
Neutrophils Relative %: 45.4 % (ref 43.0–77.0)
Platelets: 344 K/uL (ref 150.0–400.0)
RBC: 4.22 Mil/uL (ref 3.87–5.11)
RDW: 13.4 % (ref 11.5–15.5)
WBC: 5.3 K/uL (ref 4.0–10.5)

## 2024-02-13 MED ORDER — NA SULFATE-K SULFATE-MG SULF 17.5-3.13-1.6 GM/177ML PO SOLN: 1.0000 | Freq: Once | ORAL | 0 refills | Status: AC

## 2024-02-13 NOTE — Patient Instructions (Signed)
 Your provider has requested that you go to the basement level for lab work before leaving today. Press B on the elevator. The lab is located at the first door on the left as you exit the elevator.  You have been scheduled for a Colonoscopy. Please follow written instructions given to you at your visit today.   If you use inhalers (even only as needed), please bring them with you on the day of your procedure.  DO NOT TAKE 7 DAYS PRIOR TO TEST- Trulicity (dulaglutide) Ozempic, Wegovy (semaglutide) Mounjaro (tirzepatide) Bydureon Bcise (exanatide extended release)  DO NOT TAKE 1 DAY PRIOR TO YOUR TEST Rybelsus (semaglutide) Adlyxin (lixisenatide) Victoza (liraglutide) Byetta (exanatide) ___________________________________________________________________________  Please follow up sooner if symptoms increase or worsen   Due to recent changes in healthcare laws, you may see the results of your imaging and laboratory studies on MyChart before your provider has had a chance to review them.  We understand that in some cases there may be results that are confusing or concerning to you. Not all laboratory results come back in the same time frame and the provider may be waiting for multiple results in order to interpret others.  Please give us  48 hours in order for your provider to thoroughly review all the results before contacting the office for clarification of your results.   Thank you for trusting me with your gastrointestinal care!   [name], PA-C _______________________________________________________  If your blood pressure at your visit was 140/90 or greater, please contact your primary care physician to follow up on this.  _______________________________________________________  If you are age 70 or older, your body mass index should be between 23-30. Your Body mass index is 23.12 kg/m. If this is out of the aforementioned range listed, please consider follow up with your  Primary Care Provider.  If you are age 52 or younger, your body mass index should be between 19-25. Your Body mass index is 23.12 kg/m. If this is out of the aformentioned range listed, please consider follow up with your Primary Care Provider.   ________________________________________________________  The [facility] providers would like to encourage you to use MYCHART to communicate with providers for non-urgent requests or questions.  Due to long hold times on the telephone, sending your provider a message by [facility] may be a faster and more efficient way to get a response.  Please allow 48 business hours for a response.  Please remember that this is for non-urgent requests.  _______________________________________________________

## 2024-02-14 ENCOUNTER — Ambulatory Visit: Payer: Self-pay | Admitting: Physician Assistant

## 2024-02-14 DIAGNOSIS — R197 Diarrhea, unspecified: Secondary | ICD-10-CM

## 2024-02-21 ENCOUNTER — Other Ambulatory Visit

## 2024-02-21 DIAGNOSIS — R197 Diarrhea, unspecified: Secondary | ICD-10-CM | POA: Diagnosis not present

## 2024-02-21 DIAGNOSIS — L821 Other seborrheic keratosis: Secondary | ICD-10-CM | POA: Diagnosis not present

## 2024-02-21 DIAGNOSIS — L814 Other melanin hyperpigmentation: Secondary | ICD-10-CM | POA: Diagnosis not present

## 2024-02-21 DIAGNOSIS — D1801 Hemangioma of skin and subcutaneous tissue: Secondary | ICD-10-CM | POA: Diagnosis not present

## 2024-02-23 ENCOUNTER — Ambulatory Visit: Payer: Self-pay | Admitting: Physician Assistant

## 2024-02-23 ENCOUNTER — Telehealth: Payer: Self-pay

## 2024-02-23 ENCOUNTER — Encounter: Payer: Self-pay | Admitting: Family Medicine

## 2024-02-23 LAB — GI PROFILE, STOOL, PCR

## 2024-02-23 NOTE — Telephone Encounter (Signed)
 Noted

## 2024-02-23 NOTE — Telephone Encounter (Signed)
 Received call report from [facility] regarding lab result. Pt tested positive for norovirus G-I and G-II.

## 2024-02-25 LAB — PANCREATIC ELASTASE, FECAL: Pancreatic Elastase-1, Stool: 406 ug/g (ref >200)

## 2024-02-27 LAB — CALPROTECTIN: Calprotectin: 251 ug/g — ABNORMAL HIGH

## 2024-03-05 ENCOUNTER — Ambulatory Visit: Admitting: Physical Medicine and Rehabilitation

## 2024-03-05 ENCOUNTER — Other Ambulatory Visit: Payer: Self-pay

## 2024-03-05 VITALS — BP 133/76 | HR 65

## 2024-03-05 DIAGNOSIS — M961 Postlaminectomy syndrome, not elsewhere classified: Secondary | ICD-10-CM

## 2024-03-05 DIAGNOSIS — M5416 Radiculopathy, lumbar region: Secondary | ICD-10-CM

## 2024-03-05 DIAGNOSIS — M48062 Spinal stenosis, lumbar region with neurogenic claudication: Secondary | ICD-10-CM

## 2024-03-05 DIAGNOSIS — G894 Chronic pain syndrome: Secondary | ICD-10-CM

## 2024-03-05 MED ORDER — METHYLPREDNISOLONE ACETATE 40 MG/ML IJ SUSP
40.0000 mg | Freq: Once | INTRAMUSCULAR | Status: AC
  Administered 2024-03-05: 40 mg

## 2024-03-05 NOTE — Progress Notes (Signed)
 Pain Scale   Average Pain 8 Patient advising she has lower back pain at night and when she gets up her pain lessens.        +Driver, -BT, -Dye Allergies.

## 2024-03-17 NOTE — Procedures (Signed)
 Lumbar Epidural Steroid Injection - Interlaminar Approach with Fluoroscopic Guidance  [name]      Date of Birth: 05-Apr-1945 MRN: [id number] PCP: Duanne Butler DASEN, MD      Visit Date: 03/05/2024   Universal Protocol:     Consent Given By: the patient  Position: PRONE  Additional Comments: Vital signs were monitored before and after the procedure. Patient was prepped and draped in the usual sterile fashion. The correct patient, procedure, and site was verified.   Injection Procedure Details:   Procedure diagnoses: Lumbar radiculopathy [M54.16]   Meds Administered:  Meds ordered this encounter  Medications   methylPREDNISolone  acetate (DEPO-MEDROL ) injection 40 mg     Laterality: Left  Location/Site:  L2-3  Needle: 3.5 in., 20 ga. Tuohy  Needle Placement: Paramedian epidural  Findings:   -Comments: Excellent flow of contrast into the epidural space.  Procedure Details: Using a paramedian approach from the side mentioned above, the region overlying the inferior lamina was localized under fluoroscopic visualization and the soft tissues overlying this structure were infiltrated with 4 ml. of 1% Lidocaine  without Epinephrine. The Tuohy needle was inserted into the epidural space using a paramedian approach.   The epidural space was localized using loss of resistance along with counter oblique bi-planar fluoroscopic views.  After negative aspirate for air, blood, and CSF, a 2 ml. volume of Isovue -250 was injected into the epidural space and the flow of contrast was observed. Radiographs were obtained for documentation purposes.    The injectate was administered into the level noted above.   Additional Comments:  The patient tolerated the procedure well Dressing: 2 x 2 sterile gauze and Band-Aid    Post-procedure details: Patient was observed during the procedure. Post-procedure instructions were reviewed.  Patient left the clinic in stable  condition.

## 2024-03-17 NOTE — Progress Notes (Signed)
 "  Taylor Taylor - 78 y.o. female MRN [id number]  Date of birth: Nov 18, 1945  Office Visit Note: Visit Date: 03/05/2024 PCP: Duanne Butler DASEN, MD Referred by: Duanne Butler DASEN, MD  Subjective: Chief Complaint  Patient presents with   Lower Back - Pain   HPI: Taylor Taylor is a 78 y.o. female who comes in today for evaluation and management at the request of Duwaine Pouch, FNP for chronic worsening severe at times left more than right low back pain some referral in the left hip.  She has had no trauma recently or falls excetra.  Her case is complicated by prior cervical and lumbar surgery by Dr. Leonarda.  This was an L5 foraminotomies.  We have updated MRI this year which shows moderate stenosis at L2-3 and she has done fairly well with prior epidural injections at L2-3.  Facet joint block was not as successful.  She has had no focal weakness or bowel or bladder issues.  Last injection was September and she did get a couple months of decent relief.  She does feel like her symptoms are somewhat similar.  Most of her symptoms occur laying down and then getting up and moving actually gets better.   I spent more than 30 minutes speaking face-to-face with the patient with 50% of the time in counseling and discussing coordination of care.       Review of Systems  Musculoskeletal:  Positive for back pain and joint pain.  All other systems reviewed and are negative.  Otherwise per HPI.  Assessment & Plan: Visit Diagnoses:    ICD-10-CM   1. Lumbar radiculopathy  M54.16 XR C-ARM NO REPORT    Epidural Steroid injection    methylPREDNISolone  acetate (DEPO-MEDROL ) injection 40 mg    2. Spinal stenosis of lumbar region with neurogenic claudication  M48.062     3. Post laminectomy syndrome  M96.1     4. Chronic pain syndrome  G89.4        Plan: Findings:  Chronic low back pain with history of lumbar stenosis and facet arthritis with postlaminectomy syndrome as well.  Epidural  injections over the years somewhat beneficial.  Facet joints less so.  Surgery helped leg pain to some degree but she continues to have chronic pain syndrome overall.  Tried and failed medications and exercise and physical therapy.  Will repeat epidural injection today.  Depending on relief would consider facet block with goal towards radiofrequency ablation.  She may be a candidate for spinal cord stimulator trial.    Meds & Orders:  Meds ordered this encounter  Medications   methylPREDNISolone  acetate (DEPO-MEDROL ) injection 40 mg    Orders Placed This Encounter  Procedures   XR C-ARM NO REPORT   Epidural Steroid injection    Follow-up: Return for visit to requesting provider as needed.   Procedures: No procedures performed  Lumbar Epidural Steroid Injection - Interlaminar Approach with Fluoroscopic Guidance  [name]      Date of Birth: 1945/08/10 MRN: [id number] PCP: Duanne Butler DASEN, MD      Visit Date: 03/05/2024   Universal Protocol:     Consent Given By: the patient  Position: PRONE  Additional Comments: Vital signs were monitored before and after the procedure. Patient was prepped and draped in the usual sterile fashion. The correct patient, procedure, and site was verified.   Injection Procedure Details:   Procedure diagnoses: Lumbar radiculopathy [M54.16]   Meds Administered:  Meds ordered this encounter  Medications   methylPREDNISolone  acetate (DEPO-MEDROL ) injection 40 mg     Laterality: Left  Location/Site:  L2-3  Needle: 3.5 in., 20 ga. Tuohy  Needle Placement: Paramedian epidural  Findings:   -Comments: Excellent flow of contrast into the epidural space.  Procedure Details: Using a paramedian approach from the side mentioned above, the region overlying the inferior lamina was localized under fluoroscopic visualization and the soft tissues overlying this structure were infiltrated with 4 ml. of 1% Lidocaine  without  Epinephrine. The Tuohy needle was inserted into the epidural space using a paramedian approach.   The epidural space was localized using loss of resistance along with counter oblique bi-planar fluoroscopic views.  After negative aspirate for air, blood, and CSF, a 2 ml. volume of Isovue -250 was injected into the epidural space and the flow of contrast was observed. Radiographs were obtained for documentation purposes.    The injectate was administered into the level noted above.   Additional Comments:  The patient tolerated the procedure well Dressing: 2 x 2 sterile gauze and Band-Aid    Post-procedure details: Patient was observed during the procedure. Post-procedure instructions were reviewed.  Patient left the clinic in stable condition.   Clinical History: MRI CERVICAL SPINE WITHOUT CONTRAST  TECHNIQUE: Multiplanar, multisequence MR imaging of the cervical spine was performed. No intravenous contrast was administered.  COMPARISON: Cervical spine MRI 08/30/2022.  FINDINGS: Alignment: Stable mild straightening of cervical lordosis. Mild degenerative appearing retrolisthesis of L4 on L5 is stable.  Vertebrae: Stable ACDF hardware at C5-C6. MRI suggest solid arthrodesis at those levels. Background bone marrow signal is within normal limits. Chronic degenerative marrow endplate changes at C4-C5 and C6-C7 adjacent segments. Faint degenerative appearing marrow edema now in the left C2 facet (series 5, image 12) with trace degenerative appearing facet joint fluid there and a new posteriorly situated synovial cyst (series 4, image 12) see additional details below.  Cord: Up to mild degenerative cervical spinal cord mass effect but no cord signal abnormality. See details below.  Posterior Fossa, vertebral arteries, paraspinal tissues: Cervicomedullary junction is within normal limits. Grossly stable and negative visible posterior fossa, brain parenchyma.  Preserved major  vascular flow voids in the bilateral neck. Left vertebral artery slightly dominant as before. Negative visible neck soft tissues, including adjacent to the left C2-C3 is synovial cyst on series 8, image 10. Negative visible lung apices.  Disc levels:  C2-C3: Moderate facet hypertrophy on the left appears somewhat progressed from last year. Mild degenerative facet joint fluid, mild C2 facet marrow edema and new posteriorly situated synovial cyst (should not cause neural compromise series 5, image 12). But there is moderate to severe left C3 neural foraminal stenosis when combined with asymmetric endplate spurring, and appears increased. Mild contralateral right facet hypertrophy.  C3-C4: Largely negative disc and endplate. Moderate facet hypertrophy greater on the right. No spinal stenosis. Mild to moderate right C4 foraminal stenosis is stable.  C4-C5: Severe disc space loss, circumferential disc osteophyte complex with subtle underlying retrolisthesis. Mild to moderate facet and ligament flavum hypertrophy. Mild spinal stenosis match that mild spinal stenosis (series 8, image 15). Mild if any spinal cord mass effect. Moderate bilateral C5 foraminal stenosis. This level appears stable.  C5-C6: Chronic ACDF.  C6-C7: Moderate disc space loss, circumferential disc osteophyte complex. Moderate facet and moderate to severe ligament flavum hypertrophy. Mild spinal stenosis and mild spinal cord mass effect best seen on series 4, image 8. mild to moderate bilateral C7 foraminal stenosis. This level appears  stable.  C7-T1: Subtle anterolisthesis. Moderate facet and ligament flavum hypertrophy. No convincing stenosis.  Partially visible chronic upper thoracic disc degeneration.  IMPRESSION: 1. Chronic C5-C6 ACDF with stable adjacent segment disease at both C4-C5 and C5-C6 from last year including mild multifactorial spinal stenosis, up to mild spinal cord mass effect, and  moderate biforaminal stenosis. No spinal cord signal abnormality.  2. Progression of Left C2-C3 facet arthropathy appears since last year, with new mild facet marrow edema, trace joint fluid, and a new posteriorly situated synovial cyst. Underlying moderate to severe left C3 neural foraminal stenosis also appears progressed.   Electronically Signed By: VEAR Hurst M.D. On: 07/11/2023 10:16 ---- MRI LUMBAR SPINE WITHOUT CONTRAST  IMPRESSION: 1. Mildly progressive lumbar disc and facet degeneration since 2023. 2. Moderate spinal stenosis at L2-3. 3. Mild spinal stenosis and moderate left lateral recess stenosis at L3-4. 4. Moderate bilateral neural foraminal stenosis at L5-S1.     Electronically Signed   By: Dasie Hamburg M.D.   On: 06/29/2023 14:16   She reports that she has never smoked. She has never used smokeless tobacco. No results for input(s): HGBA1C, LABURIC in the last 8760 hours.  Objective:  VS:  HT:    WT:   BMI:     BP:133/76  HR:65bpm  TEMP: ( )  RESP:  Physical Exam Vitals and nursing note reviewed.  Constitutional:      General: She is not in acute distress.    Appearance: Normal appearance. She is well-developed. She is not ill-appearing.  HENT:     Head: Normocephalic and atraumatic.  Eyes:     Conjunctiva/sclera: Conjunctivae normal.     Pupils: Pupils are equal, round, and reactive to light.  Cardiovascular:     Rate and Rhythm: Normal rate.     Pulses: Normal pulses.  Pulmonary:     Effort: Pulmonary effort is normal.  Musculoskeletal:     Right lower leg: No edema.     Left lower leg: No edema.  Skin:    General: Skin is warm and dry.     Findings: No erythema or rash.  Neurological:     General: No focal deficit present.     Mental Status: She is alert and oriented to person, place, and time.     Sensory: No sensory deficit.     Motor: No abnormal muscle tone.     Coordination: Coordination normal.     Gait: Gait normal.   Psychiatric:        Mood and Affect: Mood normal.        Behavior: Behavior normal.     Ortho Exam  Imaging: No results found.  Past Medical/Family/Surgical/Social History: Medications & Allergies reviewed per EMR, new medications updated. Patient Active Problem List   Diagnosis Date Noted   PVD (peripheral vascular disease) 01/12/2024   Arthritis 08/16/2022   Seasonal allergies 08/16/2022   Viral gastroenteritis 07/13/2022   Fatigue 11/10/2021   Unilateral primary osteoarthritis, right knee 08/24/2016   Osteopenia 11/16/2015   Hypertriglyceridemia without hypercholesterolemia 09/03/2013   Past Medical History:  Diagnosis Date   Arthritis    back   Fatigue 11/10/2021   GERD (gastroesophageal reflux disease)    in past   Hypertriglyceridemia without hypercholesterolemia 09/03/2013   Mild cognitive impairment of uncertain or unknown etiology 08/16/2022   Neck pain on right side 12/25/2018   Osteopenia 11/16/2015   h/o bisphos use in records. PCP manages, dexa normal 2020, per pt as of 2022, osteopenia  again, advised to f/u with PCP to discussed tx plan   Pain in left hand 05/18/2018   Pain in right elbow 10/15/2020   Pain in right hand 05/18/2018   Pain in right hip 04/30/2019   Pain in right shoulder 12/11/2018   Precordial chest pain 08/31/2009   Seasonal allergies    Unilateral primary osteoarthritis, right knee 08/24/2016   Viral gastroenteritis 07/13/2022   Family History  Problem Relation Age of Onset   Parkinson's disease Mother    Brain cancer Father    Breast cancer Neg Hx    Past Surgical History:  Procedure Laterality Date   BACK SURGERY     Scheduled for 08/19/2021., Dr, Rondal.   CERVICAL DISC SURGERY     over 10 years   COLONOSCOPY     Social History   Occupational History   Occupation: Retired  Tobacco Use   Smoking status: Never   Smokeless tobacco: Never  Vaping Use   Vaping status: Never Used  Substance and Sexual Activity    Alcohol use: No   Drug use: No   Sexual activity: Not Currently    "

## 2024-03-24 NOTE — Progress Notes (Signed)
 "  Office Visit Note  [name]             Date of Birth: 1945/05/05           MRN: [id number]             PCP: Duanne Butler DASEN, MD Referring: Duanne Butler DASEN, MD Visit Date: 03/29/2024 Occupation: Data Unavailable  Subjective:  New Patient (Initial Visit) and Abnormal Lab   Discussed the use of AI scribe software for clinical note transcription with the patient, who gave verbal consent to proceed.  History of Present Illness   Taylor Taylor is a 79 year old female who presents for evaluation of positive rheumatoid factor with chronic joint pain and worsening deformity in her hands.  She experiences significant joint pain and stiffness in several areas, with her primary concern being her right hand, particularly the middle finger, which she describes as 'giving me a real fit' and being 'thrombic.' She also has difficulty wearing shoes due to pain in the bottom of her feet. Approximately six months ago, she had a positive rheumatoid factor test, although the result was not highly positive. She has not had any x-rays of her hands.  She was previously prescribed Cymbalta  for joint pain but discontinued it due to diarrhea, which improved after stopping the medication. However, she still experiences frequent bowel movements and is scheduled for a colonoscopy on January 20th to investigate potential causes, including lymphocytic colitis.  She has been using Voltaren , both gel and pills, for years to manage her joint pain, taking 75 mg once daily and applying the gel at night. She has not experienced any significant gastrointestinal issues from Voltaren , although her dosage was reduced due to concerns about kidney function. She also takes Tylenol  Arthritis, turmeric, and omega-3 supplements.  Her family history includes arthritis, with her grandmother having severe arthritis in her hands and her mother having some arthritis along with Parkinson's disease. She  has a history of osteoporosis and has been on Fosamax  for several years, taking it once weekly.  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       Activities of Daily Living:  Patient reports morning stiffness for 5-10 minutes.   Patient Denies nocturnal pain.  Difficulty dressing/grooming: Denies Difficulty climbing stairs: Denies Difficulty getting out of chair: Reports Difficulty using hands for taps, buttons, cutlery, and/or writing: Reports  Review of Systems  Constitutional:  Positive for fatigue.  HENT:  Positive for mouth dryness. Negative for mouth sores.   Eyes:  Positive for dryness.  Respiratory:  Negative for shortness of breath.   Cardiovascular:  Positive for chest pain. Negative for palpitations.  Gastrointestinal:  Positive for blood in stool and diarrhea. Negative for constipation.  Endocrine: Positive for increased urination.  Genitourinary:  Positive for involuntary urination.  Musculoskeletal:  Positive for joint pain, gait problem, joint pain, joint swelling, myalgias, muscle weakness, morning stiffness, muscle tenderness and myalgias.  Skin:  Negative for color change, rash, hair loss and sensitivity to sunlight.  Allergic/Immunologic: Negative for susceptible to infections.  Neurological:  Negative for dizziness and headaches.  Hematological:  Positive for swollen glands.  Psychiatric/Behavioral:  Negative for depressed mood and sleep disturbance. The patient is not nervous/anxious.     PMFS History:  Patient Active Problem List   Diagnosis Date Noted   PVD (peripheral vascular disease) 01/12/2024   Arthritis 08/16/2022   Seasonal allergies 08/16/2022   Viral gastroenteritis  07/13/2022   Fatigue 11/10/2021   Unilateral primary osteoarthritis, right knee 08/24/2016   Osteopenia 11/16/2015   Hypertriglyceridemia without hypercholesterolemia 09/03/2013    Past Medical History:  Diagnosis Date   Arthritis    back    Fatigue 11/10/2021   GERD (gastroesophageal reflux disease)    in past   Hypertriglyceridemia without hypercholesterolemia 09/03/2013   Mild cognitive impairment of uncertain or unknown etiology 08/16/2022   Neck pain on right side 12/25/2018   Osteopenia 11/16/2015   h/o bisphos use in records. PCP manages, dexa normal 2020, per pt as of 2022, osteopenia again, advised to f/u with PCP to discussed tx plan   Pain in left hand 05/18/2018   Pain in right elbow 10/15/2020   Pain in right hand 05/18/2018   Pain in right hip 04/30/2019   Pain in right shoulder 12/11/2018   Precordial chest pain 08/31/2009   Seasonal allergies    Unilateral primary osteoarthritis, right knee 08/24/2016   Viral gastroenteritis 07/13/2022    Family History  Problem Relation Age of Onset   Parkinson's disease Mother    Brain cancer Father    COPD Sister    Breast cancer Neg Hx    Past Surgical History:  Procedure Laterality Date   BACK SURGERY     Scheduled for 08/19/2021., Dr, Rondal.   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  Administered Date(s) Administered    sv, Bivalent, Protein Subunit Rsvpref,pf (Abrysvo) 03/25/2023   Fluad Quad(high Dose 65+) 12/06/2018   Fluad Trivalent(High Dose 65+) 12/15/2022   Hepatitis A 12/10/1997, 09/17/1998   Hepatitis A, Ped/Adol-2 Dose 12/10/1997, 09/17/1998   Hepatitis B 12/10/1997, 01/09/1998, 08/11/1998   Hepatitis B, PED/ADOLESCENT 12/10/1997, 01/09/1998, 08/11/1998   INFLUENZA, HIGH DOSE SEASONAL PF 12/22/2015, 10/27/2016, 12/15/2017, 12/06/2018   Influenza Inj Mdck Quad With Preservative 11/19/2016, 11/20/2019   Influenza Split 01/18/2012   Influenza,inj,Quad PF,6+ Mos 01/01/2013, 01/07/2014, 12/30/2014   Influenza-Unspecified 12/20/2014, 10/27/2016, 12/23/2020,  12/19/2021   PFIZER(Purple Top)SARS-COV-2 Vaccination 04/12/2019, 05/02/2019, 12/25/2019, 07/20/2020   Pfizer Covid-19 Vaccine Bivalent Booster 72yrs & up 12/23/2020   Pneumococcal Conjugate-13 07/17/2014   Pneumococcal Polysaccharide-23 07/12/2011   Td 12/09/1997   Td (Adult),5 Lf Tetanus Toxid, Preservative Free 12/09/1997   Tdap 07/12/2011   Zoster Recombinant(Shingrix) 08/12/2016, 12/24/2016   Zoster, Live 02/26/2010     Objective: Vital Signs: BP 99/61 (BP Location: Left Arm, Patient Position: Sitting, Cuff Size: Normal)   Pulse 77   Temp 98.1 F (36.7 C)   Resp 16   Ht 5' 2 (1.575 m)   Wt 134 lb 3.2 oz (60.9 kg)   BMI 24.55 kg/m    Physical Exam Eyes:     Conjunctiva/sclera: Conjunctivae normal.  Cardiovascular:     Rate and Rhythm: Normal rate and regular rhythm.  Pulmonary:     Effort: Pulmonary effort is normal.     Breath sounds: Normal breath sounds.  Lymphadenopathy:     Cervical: No cervical adenopathy.  Skin:    General: Skin is warm and dry.     Findings: No rash.  Neurological:     Mental Status: She is alert.  Psychiatric:        Mood and Affect: Mood normal.  Musculoskeletal Exam:  Neck full ROM no tenderness Shoulders full ROM no tenderness or swelling Elbows full ROM no tenderness or swelling Wrists full ROM no tenderness or swelling Fingers right 3rd DIP widening, tenderness and decreased ROM with slight soft tissue swelling or thickening, mild 2nd/3rd digits lateral deviations Right 1st CMC swvere suawring and MCP subluxation/hyperexension Knees full ROM no tenderness or swelling, B/l patlelfemocal crepitus Ankles full ROM no tenderness or swelling   Investigation: No additional findings.  Imaging: XR C-ARM NO REPORT Result Date: 03/05/2024 Please see Notes tab for imaging impression.  Epidural Steroid injection Result Date: 03/05/2024 Eldonna Novel, MD     03/17/2024  8:31 PM Lumbar Epidural Steroid Injection -  Interlaminar Approach with Fluoroscopic Guidance Patient: Myrian Botello     Date of Birth: 01-03-1946 MRN: [id number] PCP: Duanne Butler DASEN, MD     Visit Date: 03/05/2024  Universal Protocol:   Consent Given By: the patient Position: PRONE Additional Comments: Vital signs were monitored before and after the procedure. Patient was prepped and draped in the usual sterile fashion. The correct patient, procedure, and site was verified. Injection Procedure Details: Procedure diagnoses: Lumbar radiculopathy [M54.16] Meds Administered: Meds ordered this encounter Medications  methylPREDNISolone  acetate (DEPO-MEDROL ) injection 40 mg  Laterality: Left Location/Site:  L2-3 Needle: 3.5 in., 20 ga. Tuohy Needle Placement: Paramedian epidural Findings:  -Comments: Excellent flow of contrast into the epidural space. Procedure Details: Using a paramedian approach from the side mentioned above, the region overlying the inferior lamina was localized under fluoroscopic visualization and the soft tissues overlying this structure were infiltrated with 4 ml. of 1% Lidocaine  without Epinephrine. The Tuohy needle was inserted into the epidural space using a paramedian approach. The epidural space was localized using loss of resistance along with counter oblique bi-planar fluoroscopic views.  After negative aspirate for air, blood, and CSF, a 2 ml. volume of Isovue -250 was injected into the epidural space and the flow of contrast was observed. Radiographs were obtained for documentation purposes.  The injectate was administered into the level noted above. Additional Comments: The patient tolerated the procedure well Dressing: 2 x 2 sterile gauze and Band-Aid  Post-procedure details: Patient was observed during the procedure. Post-procedure instructions were reviewed. Patient left the clinic in stable condition.   Recent Labs: Lab Results  Component Value Date   WBC 5.3 02/13/2024   HGB 13.1 02/13/2024   PLT 344.0 02/13/2024    NA 142 02/13/2024   K 5.0 02/13/2024   CL 104 02/13/2024   CO2 33 (H) 02/13/2024   GLUCOSE 91 02/13/2024   BUN 28 (H) 02/13/2024   CREATININE 0.89 02/13/2024   BILITOT 0.4 02/13/2024   ALKPHOS 42 02/13/2024   AST 16 02/13/2024   ALT 17 02/13/2024   PROT 7.1 02/13/2024   ALBUMIN 4.5 02/13/2024   CALCIUM 9.8 02/13/2024   GFRAA 83 07/20/2020    Speciality Comments: No specialty comments available.  Procedures:  No procedures performed Allergies: Pollen extract   Assessment / Plan:     Visit Diagnoses:  Assessment & Plan Rheumatoid factor positive Primary osteoarthritis of both hands Chronic joint pain and stiffness, particularly in the right hand and feet. Positive rheumatoid factor, differential includes rheumatoid arthritis and osteoarthritis. Examination suggests osteoarthritis with possible inflammatory component. EOH possible but only one DIP significantly abnormal otherwise 1st CMC OA. Current treatment with Voltaren  provides partial relief. Fatigue may be related to inflammation but is nonspecific. - Ordered x-ray of hands to assess joint changes. -  Repeated rheumatoid factor test, checking ESR and complements for assessment of systemic inflammation. - Rechecked inflammatory marker tests. - Scheduled follow-up in 2-4 weeks to review results and discuss treatment.   Follow-Up Instructions: No follow-ups on file.   Taylor LELON Ester, MD  Note - This record has been created using [employer].  Chart creation errors have been sought, but may not always  have been located. Such creation errors do not reflect on  the standard of medical care.      [1]  Social History Tobacco Use   Smoking status: Never    Passive exposure: Never   Smokeless tobacco: Never  Vaping Use   Vaping status: Never Used  Substance Use Topics   Alcohol use: No   Drug use: No   "

## 2024-03-29 ENCOUNTER — Ambulatory Visit

## 2024-03-29 ENCOUNTER — Encounter: Payer: Self-pay | Admitting: Internal Medicine

## 2024-03-29 ENCOUNTER — Ambulatory Visit: Attending: Internal Medicine | Admitting: Internal Medicine

## 2024-03-29 VITALS — BP 99/61 | HR 77 | Temp 98.1°F | Resp 16 | Ht 62.0 in | Wt 134.2 lb

## 2024-03-29 DIAGNOSIS — M19042 Primary osteoarthritis, left hand: Secondary | ICD-10-CM | POA: Diagnosis not present

## 2024-03-29 DIAGNOSIS — R7689 Other specified abnormal immunological findings in serum: Secondary | ICD-10-CM | POA: Diagnosis not present

## 2024-03-29 DIAGNOSIS — M19041 Primary osteoarthritis, right hand: Secondary | ICD-10-CM | POA: Diagnosis not present

## 2024-03-29 NOTE — Assessment & Plan Note (Addendum)
 Chronic joint pain and stiffness, particularly in the right hand and feet. Positive rheumatoid factor, differential includes rheumatoid arthritis and osteoarthritis. Examination suggests osteoarthritis with possible inflammatory component. EOH possible but only one DIP significantly abnormal otherwise 1st CMC OA. Current treatment with Voltaren  provides partial relief. Fatigue may be related to inflammation but is nonspecific. - Ordered x-ray of hands to assess joint changes. - Repeated rheumatoid factor test, checking ESR and complements for assessment of systemic inflammation. - Rechecked inflammatory marker tests. - Scheduled follow-up in 2-4 weeks to review results and discuss treatment.

## 2024-03-29 NOTE — Patient Instructions (Signed)
        Supplements for Osteoarthritis  Natural anti-inflammatories can help reduce inflammation and joint stiffness without some of the harmful side effects of non-steroidal anti-inflammatories (Advil, Motrin, Aleve , etc)  Recommend starting with one supplement and give a 3-4 week trial period before adding another  You may be able to find some of these products at your local pharmacy, but may also purchase at [employer], other specialty stores, or online   Turmeric Recommended dose 400mg  once a day May increase to twice a day if tolerated (may cause stomach upset) Do not take if you are on a blood thinner, and stop prior to surgery   Ginger (root or capsules) Recommended dose is 2 grams twice daily, or 2 cups of tea daily Do not take if you are on a blood thinner, and stop prior to surgery   Fish Oil or Omega 3 Recommended dose for capsule is 2 grams twice daily (make sure it contains at least 30% of EPA/DHA) For food, two 3-ounce servings of fish a week, or flaxseed, chia seeds, walnuts, and almonds   Tart Cherry (dried, extract, or tablets) Recommended dose is 500mg  once a day   *Although these are natural products, they can still interact with medications. Always consult with your doctor or pharmacist when starting new supplements and/or medications*  *Patents should be under the care of a physician or other medical provide while taking these supplements*

## 2024-03-30 LAB — RHEUMATOID FACTOR: Rheumatoid fact SerPl-aCnc: 18 [IU]/mL — ABNORMAL HIGH

## 2024-03-30 LAB — SEDIMENTATION RATE: Sed Rate: 2 mm/h (ref 0–30)

## 2024-03-30 LAB — C3 AND C4
C3 Complement: 113 mg/dL (ref 83–193)
C4 Complement: 19 mg/dL (ref 15–57)

## 2024-04-01 ENCOUNTER — Encounter: Admitting: Internal Medicine

## 2024-04-02 ENCOUNTER — Encounter: Payer: Self-pay | Admitting: Internal Medicine

## 2024-04-09 ENCOUNTER — Encounter: Payer: Self-pay | Admitting: Internal Medicine

## 2024-04-09 ENCOUNTER — Ambulatory Visit: Admitting: Internal Medicine

## 2024-04-09 VITALS — BP 132/70 | HR 76 | Temp 97.7°F | Resp 12 | Ht 62.0 in | Wt 134.0 lb

## 2024-04-09 DIAGNOSIS — K529 Noninfective gastroenteritis and colitis, unspecified: Secondary | ICD-10-CM

## 2024-04-09 DIAGNOSIS — K648 Other hemorrhoids: Secondary | ICD-10-CM | POA: Diagnosis not present

## 2024-04-09 DIAGNOSIS — K6389 Other specified diseases of intestine: Secondary | ICD-10-CM

## 2024-04-09 DIAGNOSIS — Q439 Congenital malformation of intestine, unspecified: Secondary | ICD-10-CM

## 2024-04-09 DIAGNOSIS — K5730 Diverticulosis of large intestine without perforation or abscess without bleeding: Secondary | ICD-10-CM | POA: Diagnosis not present

## 2024-04-09 MED ORDER — SODIUM CHLORIDE 0.9 % IV SOLN: 500.0000 mL | Freq: Once | INTRAVENOUS | Status: DC

## 2024-04-09 NOTE — Patient Instructions (Signed)
 Resume previous diet  Continue present medications  Await pathology results  No need for repeat screening colonoscopy  YOU HAD AN ENDOSCOPIC PROCEDURE TODAY AT THE [facility]:   Refer to the procedure report that was given to you for any specific questions about what was found during the examination.  If the procedure report does not answer your questions, please call your gastroenterologist to clarify.  If you requested that your care partner not be given the details of your procedure findings, then the procedure report has been included in a sealed envelope for you to review at your convenience later.  YOU SHOULD EXPECT: Some feelings of bloating in the abdomen. Passage of more gas than usual.  Walking can help get rid of the air that was put into your GI tract during the procedure and reduce the bloating. If you had a lower endoscopy (such as a colonoscopy or flexible sigmoidoscopy) you may notice spotting of blood in your stool or on the toilet paper. If you underwent a bowel prep for your procedure, you may not have a normal bowel movement for a few days.  Please Note:  You might notice some irritation and congestion in your nose or some drainage.  This is from the oxygen used during your procedure.  There is no need for concern and it should clear up in a day or so.  SYMPTOMS TO REPORT IMMEDIATELY: Following lower endoscopy (colonoscopy or flexible sigmoidoscopy):  Excessive amounts of blood in the stool  Significant tenderness or worsening of abdominal pains  Swelling of the abdomen that is new, acute  Fever of 100F or higher  For urgent or emergent issues, a gastroenterologist can be reached at any hour by calling [phone]. Do not use MyChart messaging for urgent concerns.   DIET:  We do recommend a small meal at first, but then you may proceed to your regular diet.  Drink plenty of fluids but you should avoid alcoholic beverages for 24 hours.  ACTIVITY:  You should  plan to take it easy for the rest of today and you should NOT DRIVE or use heavy machinery until tomorrow (because of the sedation medicines used during the test).    FOLLOW UP: Our staff will call the number listed on your records the next business day following your procedure.  We will call around 7:15- 8:00 am to check on you and address any questions or concerns that you may have regarding the information given to you following your procedure. If we do not reach you, we will leave a message.     If any biopsies were taken you will be contacted by phone or by letter within the next 1-3 weeks.  Please call us  at [phone] if you have not heard about the biopsies in 3 weeks.   SIGNATURES/CONFIDENTIALITY: You and/or your care partner have signed paperwork which will be entered into your electronic medical record.  These signatures attest to the fact that that the information above on your After Visit Summary has been reviewed and is understood.  Full responsibility of the confidentiality of this discharge information lies with you and/or your care-partner.

## 2024-04-09 NOTE — Op Note (Signed)
 [facility] Patient Name: Taylor Taylor Procedure Date: 04/09/2024 9:27 AM MRN: [id number] Endoscopist: Gordy CHRISTELLA Starch , MD, [id number] Age: 79 Referring MD:  Date of Birth: 1946-02-08 Gender: Female Account #: [id number] Procedure:                Colonoscopy Indications:              Chronic diarrhea with elevated fecal calprotectin                            (resolved after stopping Cymbalta ), last                            colonoscopy March 2018 (no polyps, melanosis) Medicines:                Monitored Anesthesia Care Procedure:                Pre-Anesthesia Assessment:                           - Prior to the procedure, a History and Physical                            was performed, and patient medications and                            allergies were reviewed. The patient's tolerance of                            previous anesthesia was also reviewed. The risks                            and benefits of the procedure and the sedation                            options and risks were discussed with the patient.                            All questions were answered, and informed consent                            was obtained. Prior Anticoagulants: The patient has                            taken no anticoagulant or antiplatelet agents. ASA                            Grade Assessment: II - A patient with mild systemic                            disease. After reviewing the risks and benefits,                            the patient was deemed in satisfactory condition to  undergo the procedure.                           After obtaining informed consent, the colonoscope                            was passed under direct vision. Throughout the                            procedure, the patient's blood pressure, pulse, and                            oxygen saturations were monitored continuously. The                            [id number]-HQ190L Colonoscope  [id number] was introduced                            through the anus and advanced to the cecum,                            identified by appendiceal orifice and ileocecal                            valve. The colonoscopy was performed without                            difficulty. The patient tolerated the procedure                            well. The quality of the bowel preparation was                            good. The ileocecal valve, appendiceal orifice, and                            rectum were photographed. Scope In: 9:39:47 AM Scope Out: 9:57:43 AM Scope Withdrawal Time: 0 hours 11 minutes 8 seconds  Total Procedure Duration: 0 hours 17 minutes 56 seconds  Findings:                 The digital rectal exam was normal.                           Multiple medium-mouthed and small-mouthed                            diverticula were found in the sigmoid colon and                            descending colon.                           The sigmoid colon and descending colon were mildly  tortuous.                           Internal hemorrhoids were found during                            retroflexion. The hemorrhoids were small.                           The exam was otherwise without abnormality.                           Biopsies for histology were taken with a cold                            forceps from the right colon and left colon for                            evaluation of microscopic colitis. Complications:            No immediate complications. Estimated Blood Loss:     Estimated blood loss was minimal. Impression:               - Mild diverticulosis in the sigmoid colon and in                            the descending colon.                           - Small internal hemorrhoids.                           - The examination was otherwise normal.                           - Biopsies were taken with a cold forceps from the                             right colon and left colon for evaluation of                            microscopic colitis. Recommendation:           - Patient has a contact number available for                            emergencies. The signs and symptoms of potential                            delayed complications were discussed with the                            patient. Return to normal activities tomorrow.                            Written discharge instructions were provided to the  patient.                           - Resume previous diet.                           - Continue present medications.                           - Await pathology results.                           - No repeat screening colonoscopy due to age and                            the absence of colonic polyps. Gordy CHRISTELLA Starch, MD 04/09/2024 10:04:17 AM This report has been signed electronically.

## 2024-04-09 NOTE — Progress Notes (Signed)
 "   GASTROENTEROLOGY PROCEDURE H&P NOTE   Primary Care Physician: Duanne Butler DASEN, MD    Reason for Procedure:  Chronic diarrhea with elevated fecal calprotectin  Plan:    Colonoscopy  Patient is appropriate for endoscopic procedure(s) in the ambulatory ([facility]) setting.  The nature of the procedure, as well as the risks, benefits, and alternatives were carefully and thoroughly reviewed with the patient. Ample time for discussion and questions allowed.  All questions were answered. The patient understood, was satisfied, and agreed with the plan to proceed.    HPI: Taylor Taylor is a 79 y.o. female who presents for colonoscopy.  Medical history as below.  Tolerated the prep.  No recent chest pain or shortness of breath.  No abdominal pain today.  Past Medical History:  Diagnosis Date   Arthritis    back   Fatigue 11/10/2021   GERD (gastroesophageal reflux disease)    in past   Hypertriglyceridemia without hypercholesterolemia 09/03/2013   Mild cognitive impairment of uncertain or unknown etiology 08/16/2022   Neck pain on right side 12/25/2018   Osteopenia 11/16/2015   h/o bisphos use in records. PCP manages, dexa normal 2020, per pt as of 2022, osteopenia again, advised to f/u with PCP to discussed tx plan   Pain in left hand 05/18/2018   Pain in right elbow 10/15/2020   Pain in right hand 05/18/2018   Pain in right hip 04/30/2019   Pain in right shoulder 12/11/2018   Precordial chest pain 08/31/2009   Seasonal allergies    Unilateral primary osteoarthritis, right knee 08/24/2016   Viral gastroenteritis 07/13/2022    Past Surgical History:  Procedure Laterality Date   BACK SURGERY     Scheduled for 08/19/2021., Dr, Rondal.   CERVICAL DISC SURGERY     over 10 years   COLONOSCOPY      Prior to Admission medications  Medication Sig Start Date End Date Taking? Authorizing Provider  acetaminophen  (TYLENOL ) 500 MG tablet Take 500 mg by mouth 2 (two) times daily.    Yes [provider]  alendronate  (FOSAMAX ) 70 MG tablet TAKE 1 TABLET BY MOUTH ONCE WEEKLY BEFORE THE FIRST FOOD, BEVERAGE OR MEDICINE OF THE DAY WITH PLAIN WATER 09/11/23  Yes Duanne Butler DASEN, MD  Calcium Carbonate-Vit D-Min (CALCIUM 1200 PO) Take 1 tablet by mouth in the morning and at bedtime.   Yes [provider]  cholecalciferol (VITAMIN D3) 25 MCG (1000 UNIT) tablet Take 125 mcg by mouth daily.   Yes [provider]  ferrous sulfate 325 (65 FE) MG tablet Take 325 mg by mouth daily with breakfast.   Yes [provider]  Fish Oil-Cholecalciferol (FISH OIL + D3) 1200-1000 MG-UNIT CAPS Take 1 tablet by mouth daily.   Yes [provider]  Glucosamine-Chondroit-Vit C-Mn (GLUCOSAMINE CHONDR 1500 COMPLX) CAPS Take 1 capsule by mouth 2 (two) times daily.   Yes [provider]  Lifitegrast (XIIDRA) 5 % SOLN Apply to eye.   Yes [provider]  Multiple Vitamins-Minerals (WOMENS MULTI VITAMIN & MINERAL PO) Take 1 tablet by mouth daily.   Yes [provider]  Red Yeast Rice 600 MG CAPS Take 600 mg by mouth daily.    Yes [provider]  Turmeric 500 MG TABS Take 500 mg by mouth daily.    Yes [provider]  vitamin B-12 (CYANOCOBALAMIN ) 100 MCG tablet Take 100 mcg by mouth daily.   Yes [provider]  vitamin E 100 UNIT capsule  Take 100 Units by mouth daily.   Yes [provider]  diclofenac  (VOLTAREN ) 75 MG EC tablet Take 1 tablet (75 mg total) by mouth 2 (two) times daily. Patient taking differently: Take 75 mg by mouth daily. 12/01/23   Duanne Butler DASEN, MD  diphenoxylate -atropine  (LOMOTIL ) 2.5-0.025 MG tablet Take 1 tablet by mouth 4 (four) times daily as needed for diarrhea or loose stools. Patient not taking: No sig reported 02/05/24   Duanne Butler DASEN, MD  DULoxetine  (CYMBALTA ) 30 MG capsule TAKE 1 CAPSULE BY MOUTH EVERY DAY Patient not taking: No sig reported 01/22/24   Kayla Jeoffrey RAMAN,  FNP    Current Outpatient Medications  Medication Sig Dispense Refill   acetaminophen  (TYLENOL ) 500 MG tablet Take 500 mg by mouth 2 (two) times daily.     alendronate  (FOSAMAX ) 70 MG tablet TAKE 1 TABLET BY MOUTH ONCE WEEKLY BEFORE THE FIRST FOOD, BEVERAGE OR MEDICINE OF THE DAY WITH PLAIN WATER 12 tablet 3   Calcium Carbonate-Vit D-Min (CALCIUM 1200 PO) Take 1 tablet by mouth in the morning and at bedtime.     cholecalciferol (VITAMIN D3) 25 MCG (1000 UNIT) tablet Take 125 mcg by mouth daily.     ferrous sulfate 325 (65 FE) MG tablet Take 325 mg by mouth daily with breakfast.     Fish Oil-Cholecalciferol (FISH OIL + D3) 1200-1000 MG-UNIT CAPS Take 1 tablet by mouth daily.     Glucosamine-Chondroit-Vit C-Mn (GLUCOSAMINE CHONDR 1500 COMPLX) CAPS Take 1 capsule by mouth 2 (two) times daily.     Lifitegrast (XIIDRA) 5 % SOLN Apply to eye.     Multiple Vitamins-Minerals (WOMENS MULTI VITAMIN & MINERAL PO) Take 1 tablet by mouth daily.     Red Yeast Rice 600 MG CAPS Take 600 mg by mouth daily.      Turmeric 500 MG TABS Take 500 mg by mouth daily.      vitamin B-12 (CYANOCOBALAMIN ) 100 MCG tablet Take 100 mcg by mouth daily.     vitamin E 100 UNIT capsule Take 100 Units by mouth daily.     diclofenac  (VOLTAREN ) 75 MG EC tablet Take 1 tablet (75 mg total) by mouth 2 (two) times daily. (Patient taking differently: Take 75 mg by mouth daily.) 180 tablet 1   diphenoxylate -atropine  (LOMOTIL ) 2.5-0.025 MG tablet Take 1 tablet by mouth 4 (four) times daily as needed for diarrhea or loose stools. (Patient not taking: No sig reported) 30 tablet 0   DULoxetine  (CYMBALTA ) 30 MG capsule TAKE 1 CAPSULE BY MOUTH EVERY DAY (Patient not taking: No sig reported) 90 capsule 1   Current Facility-Administered Medications  Medication Dose Route Frequency Provider Last Rate Last Admin   0.9 %  sodium chloride  infusion  500 mL Intravenous Continuous Jaheim Canino, Gordy HERO, MD       0.9 %  sodium chloride  infusion  500 mL  Intravenous Once Peaches Vanoverbeke, Gordy HERO, MD        Allergies as of 04/09/2024 - Review Complete 04/09/2024  Allergen Reaction Noted   Pollen extract Itching     Family History  Problem Relation Age of Onset   Parkinson's disease Mother    Brain cancer Father    COPD Sister    Breast cancer Neg Hx     Social History   Socioeconomic History   Marital status: Married    Spouse name: Not on file   Number of children: 2   Years of education: 13   Highest education level: Associate degree:  occupational, scientist, product/process development, or vocational program  Occupational History   Occupation: Retired  Tobacco Use   Smoking status: Never    Passive exposure: Never   Smokeless tobacco: Never  Vaping Use   Vaping status: Never Used  Substance and Sexual Activity   Alcohol use: No   Drug use: No   Sexual activity: Not Currently  Other Topics Concern   Not on file  Social History Narrative   Right handed   Drinks coffee   Lives with husband   2 story home   retired   Social Drivers of Health   Tobacco Use: Low Risk (04/09/2024)   Patient History    Smoking Tobacco Use: Never    Smokeless Tobacco Use: Never    Passive Exposure: Never  Financial Resource Strain: Low Risk (07/20/2023)   Overall Financial Resource Strain (CARDIA)    Difficulty of Paying Living Expenses: Not hard at all  Food Insecurity: No Food Insecurity (07/20/2023)   Hunger Vital Sign    Worried About Running Out of Food in the Last Year: Never true    Ran Out of Food in the Last Year: Never true  Transportation Needs: No Transportation Needs (07/20/2023)   PRAPARE - Administrator, Civil Service (Medical): No    Lack of Transportation (Non-Medical): No  Physical Activity: Sufficiently Active (07/20/2023)   Exercise Vital Sign    Days of Exercise per Week: 4 days    Minutes of Exercise per Session: 40 min  Stress: No Stress Concern Present (07/20/2023)   [employer] of Occupational Health - Occupational Stress  Questionnaire    Feeling of Stress : Not at all  Social Connections: Socially Integrated (07/20/2023)   Social Connection and Isolation Panel    Frequency of Communication with Friends and Family: Three times a week    Frequency of Social Gatherings with Friends and Family: More than three times a week    Attends Religious Services: More than 4 times per year    Active Member of Clubs or Organizations: Yes    Attends Banker Meetings: More than 4 times per year    Marital Status: Married  Catering Manager Violence: Not At Risk (07/20/2023)   Humiliation, Afraid, Rape, and Kick questionnaire    Fear of Current or Ex-Partner: No    Emotionally Abused: No    Physically Abused: No    Sexually Abused: No  Depression (PHQ2-9): Medium Risk (08/17/2023)   Depression (PHQ2-9)    PHQ-2 Score: 9  Alcohol Screen: Low Risk (07/20/2023)   Alcohol Screen    Last Alcohol Screening Score (AUDIT): 0  Housing: Unknown (07/20/2023)   Housing Stability Vital Sign    Unable to Pay for Housing in the Last Year: No    Number of Times Moved in the Last Year: Not on file    Homeless in the Last Year: No  Utilities: Not At Risk (07/20/2023)   AHC Utilities    Threatened with loss of utilities: No  Health Literacy: Adequate Health Literacy (07/20/2023)   B1300 Health Literacy    Frequency of need for help with medical instructions: Never    Physical Exam: Vital signs in last 24 hours: @BP  123/70   Pulse 92   Temp 97.7 F (36.5 C)   Ht 5' 2 (1.575 m)   Wt 134 lb (60.8 kg)   SpO2 98%   BMI 24.51 kg/m  GEN: NAD EYE: Sclerae anicteric ENT: MMM CV: Non-tachycardic Pulm: CTA b/l GI:  Soft, NT/ND NEURO:  Alert & Oriented x 3   Gordy Starch, MD [facility]  04/09/2024 9:28 AM  "

## 2024-04-09 NOTE — Progress Notes (Signed)
 Called to room to assist during endoscopic procedure.  Patient ID and intended procedure confirmed with present staff. Received instructions for my participation in the procedure from the performing physician.

## 2024-04-09 NOTE — Progress Notes (Signed)
 Report given to PACU, vss

## 2024-04-09 NOTE — Progress Notes (Signed)
 Pt's states no medical or surgical changes since previsit or office visit.

## 2024-04-10 ENCOUNTER — Telehealth: Payer: Self-pay | Admitting: Lactation Services

## 2024-04-10 NOTE — Telephone Encounter (Signed)
 No answer left voice mail

## 2024-04-11 LAB — SURGICAL PATHOLOGY

## 2024-04-11 NOTE — Assessment & Plan Note (Addendum)
 Trending down, 20 to 18. Reassuring that this is an osteoarthritis picture vs. inflammatory/erosive arthritis picture.

## 2024-04-11 NOTE — Progress Notes (Signed)
 "  Office Visit Note  [name]             Date of Birth: [birth year]           MRN: [id number]             Visit Date: 04/12/2024  PCP: Duanne Butler DASEN, MD   Subjective:   Chief Complaint: Pain (Pain in right hand and right foot.)  Discussed the use of AI scribe software for clinical note transcription with the patient, who gave verbal consent to proceed.  History of Present Illness Taylor Taylor is a 78 year old female with osteoarthritis who presents for follow up of joint pain in her hands and feet. She experiences significant joint pain primarily in her right hand and foot. The pain is severe enough that she can only wear tennis shoes due to discomfort in her feet. The pain in her hand is limited to her right middle finger. She does not currently experience pain in her right thumb. She has been taking various supplements for joint health, including turmeric, fish oil, chondroitin, and glucosamine. She previously tried duloxetine  but discontinued it due to persistent diarrhea without pain relief. Her rheumatoid factor had been elevated in the past but is now trending downwards, and all other inflammatory markers in her blood work are normal. She has not tried gabapentin  or Lyrica  for pain management yet.  Previous Medication Trials: none. Taking turmeric, ginger, glucosamine, chondroitin, and fish oil supplements.   Last Labs: 03/29/2024 - RF 18 (trending down from previous), Sed rate WNL, C3/C4 WNL CCP neg previously  Review of Systems: Review of Systems  Constitutional:  Positive for fatigue.  HENT:  Positive for mouth dryness. Negative for mouth sores.   Eyes:  Negative for dryness.  Respiratory:  Negative for shortness of breath.   Cardiovascular:  Positive for chest pain. Negative for palpitations.  Gastrointestinal:  Negative for blood in stool, constipation and diarrhea.  Endocrine: Positive for increased urination.  Genitourinary:  Positive  for involuntary urination.  Musculoskeletal:  Positive for joint pain, joint pain and joint swelling. Negative for gait problem, myalgias, muscle weakness, morning stiffness, muscle tenderness and myalgias.  Skin:  Negative for color change, rash, hair loss and sensitivity to sunlight.  Allergic/Immunologic: Negative for susceptible to infections.  Neurological:  Negative for dizziness and headaches.  Hematological:  Negative for swollen glands.  Psychiatric/Behavioral:  Negative for depressed mood and sleep disturbance. The patient is not nervous/anxious.    Medication List: Current Medications[1]   Allergies:  Pollen extract  Immunization status:  Immunization History  Administered Date(s) Administered    sv, Bivalent, Protein Subunit Rsvpref,pf (Abrysvo) 03/25/2023   Fluad Quad(high Dose 65+) 12/06/2018   Fluad Trivalent(High Dose 65+) 12/15/2022   Hepatitis A 12/10/1997, 09/17/1998   Hepatitis A, Ped/Adol-2 Dose 12/10/1997, 09/17/1998   Hepatitis B 12/10/1997, 01/09/1998, 08/11/1998   Hepatitis B, PED/ADOLESCENT 12/10/1997, 01/09/1998, 08/11/1998   INFLUENZA, HIGH DOSE SEASONAL PF 12/22/2015, 10/27/2016, 12/15/2017, 12/06/2018   Influenza Inj Mdck Quad With Preservative 11/19/2016, 11/20/2019   Influenza Split 01/18/2012   Influenza,inj,Quad PF,6+ Mos 01/01/2013, 01/07/2014, 12/30/2014   Influenza-Unspecified 12/20/2014, 10/27/2016, 12/23/2020, 12/19/2021   PFIZER(Purple Top)SARS-COV-2 Vaccination 04/12/2019, 05/02/2019, 12/25/2019, 07/20/2020   Pfizer Covid-19 Vaccine Bivalent Booster 66yrs & up 12/23/2020   Pneumococcal Conjugate-13 07/17/2014   Pneumococcal Polysaccharide-23 07/12/2011   Td 12/09/1997   Td (Adult),5 Lf Tetanus Toxid, Preservative Free 12/09/1997   Tdap 07/12/2011   Zoster Recombinant(Shingrix) 08/12/2016, 12/24/2016  Zoster, Live 02/26/2010     Problem List:  Patient Active Problem List   Diagnosis Date Noted   Osteoarthritis of hands, bilateral  03/29/2024   Rheumatoid factor positive 03/29/2024   PVD (peripheral vascular disease) 01/12/2024   Seasonal allergies 08/16/2022   Osteopenia 11/16/2015   Hypertriglyceridemia without hypercholesterolemia 09/03/2013    History: Past Medical History:  Diagnosis Date   Arthritis    back   Fatigue 11/10/2021   GERD (gastroesophageal reflux disease)    in past   Hypertriglyceridemia without hypercholesterolemia 09/03/2013   Mild cognitive impairment of uncertain or unknown etiology 08/16/2022   Neck pain on right side 12/25/2018   Osteopenia 11/16/2015   h/o bisphos use in records. PCP manages, dexa normal 2020, per pt as of 2022, osteopenia again, advised to f/u with PCP to discussed tx plan   Pain in left hand 05/18/2018   Pain in right elbow 10/15/2020   Pain in right hand 05/18/2018   Pain in right hip 04/30/2019   Pain in right shoulder 12/11/2018   Precordial chest pain 08/31/2009   Seasonal allergies    Unilateral primary osteoarthritis, right knee 08/24/2016   Viral gastroenteritis 07/13/2022    Family History  Problem Relation Age of Onset   Parkinson's disease Mother    Brain cancer Father    COPD Sister    Breast cancer Neg Hx    Past Surgical History:  Procedure Laterality Date   BACK SURGERY     Scheduled for 08/19/2021., Dr, Rondal.   CERVICAL DISC SURGERY     over 10 years   COLONOSCOPY     Social History   Social History Narrative   Right handed   Drinks coffee   Lives with husband   2 story home   retired    Objective: Vital Signs: BP (!) 112/56   Pulse 84   Temp (!) 97.5 F (36.4 C)   Resp 16   Ht 5' 2 (1.575 m)   Wt 135 lb 6.4 oz (61.4 kg)   BMI 24.76 kg/m   Physical Exam Constitutional:      General: She is not in acute distress.    Appearance: Normal appearance.  HENT:     Head: Normocephalic and atraumatic.  Eyes:     General: Lids are normal. No scleral icterus.    Conjunctiva/sclera: Conjunctivae normal.     Pupils:  Pupils are equal, round, and reactive to light.  Pulmonary:     Effort: Pulmonary effort is normal.  Skin:    General: Skin is warm.     Findings: No rash.  Neurological:     Mental Status: She is alert.  Psychiatric:        Mood and Affect: Mood normal.        Behavior: Behavior normal. Behavior is cooperative.     Imaging: No results found. Assessment & Plan Primary osteoarthritis of both hands Chronic joint pain and stiffness in the right hand and foot. Laboratory workup unrevealing. Reviewed x-rays from last visit with patient, showing osteoarthritis of the right 1st [facility] and 3rd DIP. At this time, I think going to an orthopedic hand specialist to discuss injections and/or surgery is the best next step, as well as podiatry for her foot concerns. Will also trial gabapentin . If this improves her pain, this prescription can be continued by her primary care if comfortable. We can also try Lyrica  if gabapentin  does not improve pain.  -Start gabapentin  300 mg every night at bedtime.  -  Referral to podiatry and hand surgery placed.  Orders:   Ambulatory referral to Podiatry   gabapentin  (NEURONTIN ) 300 MG capsule; Take 1 capsule (300 mg total) by mouth at bedtime.   Ambulatory referral to Orthopedic Surgery  Rheumatoid factor positive Trending down, 20 to 18. Reassuring that this is an osteoarthritis picture vs. inflammatory/erosive arthritis picture.    Follow-Up Instructions:  Return if symptoms worsen or fail to improve.   Procedures: No procedures performed  Daved GORMAN Holstein, PA-C  Note - This record has been created using [employer]. Chart creation errors have been sought, but may not always have been located. Such creation errors do not reflect on the standard of medical care.       [1]  Current Outpatient Medications:    acetaminophen  (TYLENOL ) 500 MG tablet, Take 500 mg by mouth 2 (two) times daily., Disp: , Rfl:    alendronate  (FOSAMAX ) 70 MG tablet, TAKE 1 TABLET  BY MOUTH ONCE WEEKLY BEFORE THE FIRST FOOD, BEVERAGE OR MEDICINE OF THE DAY WITH PLAIN WATER, Disp: 12 tablet, Rfl: 3   Calcium Carbonate-Vit D-Min (CALCIUM 1200 PO), Take 1 tablet by mouth in the morning and at bedtime., Disp: , Rfl:    cholecalciferol (VITAMIN D3) 25 MCG (1000 UNIT) tablet, Take 125 mcg by mouth daily., Disp: , Rfl:    diclofenac  (VOLTAREN ) 75 MG EC tablet, Take 1 tablet (75 mg total) by mouth 2 (two) times daily., Disp: 180 tablet, Rfl: 1   ferrous sulfate 325 (65 FE) MG tablet, Take 325 mg by mouth daily with breakfast., Disp: , Rfl:    Fish Oil-Cholecalciferol (FISH OIL + D3) 1200-1000 MG-UNIT CAPS, Take 1 tablet by mouth daily., Disp: , Rfl:    Glucosamine-Chondroit-Vit C-Mn (GLUCOSAMINE CHONDR 1500 COMPLX) CAPS, Take 1 capsule by mouth 2 (two) times daily., Disp: , Rfl:    Lifitegrast (XIIDRA) 5 % SOLN, Apply to eye., Disp: , Rfl:    Multiple Vitamins-Minerals (WOMENS MULTI VITAMIN & MINERAL PO), Take 1 tablet by mouth daily., Disp: , Rfl:    Red Yeast Rice 600 MG CAPS, Take 600 mg by mouth daily. , Disp: , Rfl:    Turmeric 500 MG TABS, Take 500 mg by mouth daily. , Disp: , Rfl:    vitamin B-12 (CYANOCOBALAMIN ) 100 MCG tablet, Take 100 mcg by mouth daily., Disp: , Rfl:    vitamin E 100 UNIT capsule, Take 100 Units by mouth daily., Disp: , Rfl:   "

## 2024-04-12 ENCOUNTER — Ambulatory Visit: Payer: Self-pay | Admitting: Internal Medicine

## 2024-04-12 ENCOUNTER — Ambulatory Visit

## 2024-04-12 VITALS — BP 112/56 | HR 84 | Temp 97.5°F | Resp 16 | Ht 62.0 in | Wt 135.4 lb

## 2024-04-12 DIAGNOSIS — M19042 Primary osteoarthritis, left hand: Secondary | ICD-10-CM

## 2024-04-12 DIAGNOSIS — M19041 Primary osteoarthritis, right hand: Secondary | ICD-10-CM | POA: Diagnosis not present

## 2024-04-12 DIAGNOSIS — R7689 Other specified abnormal immunological findings in serum: Secondary | ICD-10-CM

## 2024-04-12 MED ORDER — GABAPENTIN 300 MG PO CAPS: 300.0000 mg | ORAL_CAPSULE | Freq: Every day | ORAL | 2 refills | Status: AC

## 2024-04-12 NOTE — Assessment & Plan Note (Signed)
 Chronic joint pain and stiffness in the right hand and foot. Laboratory workup unrevealing. Reviewed x-rays from last visit with patient, showing osteoarthritis of the right 1st [facility] and 3rd DIP. At this time, I think going to an orthopedic hand specialist to discuss injections and/or surgery is the best next step, as well as podiatry for her foot concerns. Will also trial gabapentin . If this improves her pain, this prescription can be continued by her primary care if comfortable. We can also try Lyrica  if gabapentin  does not improve pain.  -Start gabapentin  300 mg every night at bedtime.  -Referral to podiatry and hand surgery placed.  Orders:   Ambulatory referral to Podiatry   gabapentin  (NEURONTIN ) 300 MG capsule; Take 1 capsule (300 mg total) by mouth at bedtime.   Ambulatory referral to Orthopedic Surgery

## 2024-04-18 ENCOUNTER — Ambulatory Visit: Admitting: Physician Assistant

## 2024-04-24 ENCOUNTER — Ambulatory Visit: Admitting: Podiatry

## 2024-05-01 ENCOUNTER — Ambulatory Visit: Admitting: Podiatry

## 2024-05-01 ENCOUNTER — Ambulatory Visit: Admitting: Orthopedic Surgery

## 2024-06-13 ENCOUNTER — Other Ambulatory Visit (HOSPITAL_BASED_OUTPATIENT_CLINIC_OR_DEPARTMENT_OTHER)

## 2024-07-25 ENCOUNTER — Encounter
